# Patient Record
Sex: Female | Born: 1960 | Race: White | Hispanic: No | Marital: Married | Smoking: Never smoker
Health system: Southern US, Community
[De-identification: ages and names within clinical notes are randomized; demographics above are authoritative.]

## PROBLEM LIST (undated history)

## (undated) DIAGNOSIS — M545 Low back pain, unspecified: Secondary | ICD-10-CM

## (undated) DIAGNOSIS — N201 Calculus of ureter: Secondary | ICD-10-CM

## (undated) DIAGNOSIS — Z8541 Personal history of malignant neoplasm of cervix uteri: Secondary | ICD-10-CM

## (undated) DIAGNOSIS — F32A Depression, unspecified: Secondary | ICD-10-CM

## (undated) DIAGNOSIS — F419 Anxiety disorder, unspecified: Secondary | ICD-10-CM

## (undated) DIAGNOSIS — F329 Major depressive disorder, single episode, unspecified: Secondary | ICD-10-CM

## (undated) DIAGNOSIS — F988 Other specified behavioral and emotional disorders with onset usually occurring in childhood and adolescence: Secondary | ICD-10-CM

## (undated) DIAGNOSIS — C801 Malignant (primary) neoplasm, unspecified: Secondary | ICD-10-CM

## (undated) DIAGNOSIS — N200 Calculus of kidney: Secondary | ICD-10-CM

## (undated) DIAGNOSIS — R3915 Urgency of urination: Secondary | ICD-10-CM

## (undated) DIAGNOSIS — I10 Essential (primary) hypertension: Secondary | ICD-10-CM

## (undated) DIAGNOSIS — N189 Chronic kidney disease, unspecified: Secondary | ICD-10-CM

## (undated) DIAGNOSIS — G4733 Obstructive sleep apnea (adult) (pediatric): Secondary | ICD-10-CM

## (undated) DIAGNOSIS — E785 Hyperlipidemia, unspecified: Secondary | ICD-10-CM

## (undated) DIAGNOSIS — T8859XA Other complications of anesthesia, initial encounter: Secondary | ICD-10-CM

## (undated) DIAGNOSIS — R7303 Prediabetes: Secondary | ICD-10-CM

## (undated) DIAGNOSIS — D649 Anemia, unspecified: Secondary | ICD-10-CM

## (undated) DIAGNOSIS — G8929 Other chronic pain: Secondary | ICD-10-CM

## (undated) DIAGNOSIS — K760 Fatty (change of) liver, not elsewhere classified: Secondary | ICD-10-CM

## (undated) DIAGNOSIS — Z87442 Personal history of urinary calculi: Secondary | ICD-10-CM

## (undated) DIAGNOSIS — M1990 Unspecified osteoarthritis, unspecified site: Secondary | ICD-10-CM

## (undated) DIAGNOSIS — N907 Vulvar cyst: Secondary | ICD-10-CM

## (undated) DIAGNOSIS — G4730 Sleep apnea, unspecified: Secondary | ICD-10-CM

## (undated) DIAGNOSIS — R42 Dizziness and giddiness: Secondary | ICD-10-CM

## (undated) DIAGNOSIS — E119 Type 2 diabetes mellitus without complications: Secondary | ICD-10-CM

## (undated) DIAGNOSIS — Z9889 Other specified postprocedural states: Secondary | ICD-10-CM

## (undated) DIAGNOSIS — E039 Hypothyroidism, unspecified: Secondary | ICD-10-CM

## (undated) DIAGNOSIS — R112 Nausea with vomiting, unspecified: Secondary | ICD-10-CM

## (undated) DIAGNOSIS — G2581 Restless legs syndrome: Secondary | ICD-10-CM

## (undated) DIAGNOSIS — K429 Umbilical hernia without obstruction or gangrene: Secondary | ICD-10-CM

## (undated) DIAGNOSIS — K589 Irritable bowel syndrome without diarrhea: Secondary | ICD-10-CM

## (undated) DIAGNOSIS — K219 Gastro-esophageal reflux disease without esophagitis: Secondary | ICD-10-CM

## (undated) DIAGNOSIS — I499 Cardiac arrhythmia, unspecified: Secondary | ICD-10-CM

## (undated) HISTORY — PX: HERNIA REPAIR: SHX51

## (undated) HISTORY — DX: Sleep apnea, unspecified: G47.30

## (undated) HISTORY — PX: TUBAL LIGATION: SHX77

## (undated) HISTORY — DX: Calculus of kidney: N20.0

## (undated) HISTORY — DX: Gastro-esophageal reflux disease without esophagitis: K21.9

## (undated) HISTORY — PX: LASER ABLATION OF THE CERVIX: SHX1949

## (undated) HISTORY — PX: COLONOSCOPY: SHX174

## (undated) HISTORY — PX: DIAGNOSTIC LAPAROSCOPY: SUR761

## (undated) HISTORY — PX: ABDOMINAL HYSTERECTOMY: SHX81

## (undated) HISTORY — DX: Chronic kidney disease, unspecified: N18.9

---
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
## 1998-11-11 ENCOUNTER — Ambulatory Visit (HOSPITAL_COMMUNITY): Admission: RE | Admit: 1998-11-11 | Discharge: 1998-11-11 | Payer: Self-pay | Admitting: Obstetrics & Gynecology

## 2000-08-01 ENCOUNTER — Other Ambulatory Visit: Admission: RE | Admit: 2000-08-01 | Discharge: 2000-08-01 | Payer: Self-pay | Admitting: Obstetrics and Gynecology

## 2002-03-28 ENCOUNTER — Encounter: Payer: Self-pay | Admitting: Family Medicine

## 2002-03-28 ENCOUNTER — Ambulatory Visit (HOSPITAL_COMMUNITY): Admission: RE | Admit: 2002-03-28 | Discharge: 2002-03-28 | Payer: Self-pay | Admitting: Family Medicine

## 2002-04-03 ENCOUNTER — Other Ambulatory Visit: Admission: RE | Admit: 2002-04-03 | Discharge: 2002-04-03 | Payer: Self-pay | Admitting: Obstetrics and Gynecology

## 2002-04-23 ENCOUNTER — Observation Stay (HOSPITAL_COMMUNITY): Admission: RE | Admit: 2002-04-23 | Discharge: 2002-04-24 | Payer: Self-pay | Admitting: Obstetrics and Gynecology

## 2002-04-23 HISTORY — PX: LAPAROSCOPIC ASSISTED VAGINAL HYSTERECTOMY: SHX5398

## 2002-04-24 ENCOUNTER — Encounter (INDEPENDENT_AMBULATORY_CARE_PROVIDER_SITE_OTHER): Payer: Self-pay

## 2002-06-27 HISTORY — PX: BUNIONECTOMY: SHX129

## 2003-07-17 ENCOUNTER — Ambulatory Visit (HOSPITAL_COMMUNITY): Admission: RE | Admit: 2003-07-17 | Discharge: 2003-07-17 | Payer: Self-pay | Admitting: Family Medicine

## 2003-11-20 ENCOUNTER — Other Ambulatory Visit: Admission: RE | Admit: 2003-11-20 | Discharge: 2003-11-20 | Payer: Self-pay | Admitting: Family Medicine

## 2003-12-05 ENCOUNTER — Ambulatory Visit (HOSPITAL_COMMUNITY): Admission: RE | Admit: 2003-12-05 | Discharge: 2003-12-05 | Payer: Self-pay | Admitting: Family Medicine

## 2007-03-30 ENCOUNTER — Other Ambulatory Visit: Admission: RE | Admit: 2007-03-30 | Discharge: 2007-03-30 | Payer: Self-pay | Admitting: Family Medicine

## 2010-07-17 LAB — HM MAMMOGRAPHY: HM Mammogram: NEGATIVE

## 2010-11-12 NOTE — H&P (Signed)
NAME:  [name] Sherman, [name] Sherman                      ACCOUNT NO.:  [id number]   MEDICAL RECORD NO.:  [id number]                   PATIENT TYPE:   LOCATION:                                       FACILITY:   PHYSICIAN:  Zenaida Niece, M.D.             DATE OF BIRTH:  1960/08/08   DATE OF ADMISSION:  04/23/2002  DATE OF DISCHARGE:                                HISTORY & PHYSICAL   CHIEF COMPLAINT:  Pelvic pain and abnormal bleeding.   HISTORY OF PRESENT ILLNESS:  This is a 50 year old, white female, gravida 3,  para 2-0-1-0, whom I saw on April 03, 2002, for an annual exam.  She states  that she has fairly regular menses which are heavy for three days and she  has severe pelvic pain the week before, during, and the week after her  menses.  She has constant left lower quadrant pain with occasional  exacerbation and this pain is worse with coitus.  She always has deep  dyspareunia for the past two years and this is getting worse.  Due to this,  she has decreased sex drive.  She also complains of leaking urine with  coughing, laughing, sneezing, and with intercourse.  The physical exam  revealed uterosacral ligament tenderness with tenderness adnexa and a normal-  sized uterus.  She also has an exam consistent with stress incontinence and  a cystocele.  Options were discussed with the patient and she wishes to  proceed with definitive surgical therapy and is admitted for this at this  time.   PAST OBSTETRICAL HISTORY:  Two vaginal deliveries at term without  complications weighing 8 pounds 7 ounces and 9 pounds 8 ounces and one  elective termination.   PAST MEDICAL HISTORY:  Irritable bowel syndrome.   PAST SURGICAL HISTORY:  1. Bilateral tubal ligation.  2. Laser therapy to her cervix x 2.   ALLERGIES:  None known.   MEDICATIONS:  1. Multivitamins.  2. Over the counter iron.  3. Over the counter calcium.   GYNECOLOGICAL HISTORY:  1. History of two abnormal Pap smears,  treated with laser.  2. History of chlamydia.   SOCIAL HISTORY:  The patient is single.  Denies alcohol, tobacco, or drug  use.   FAMILY HISTORY:  Paternal grandmother with breast cancer.  Paternal aunt  with breast cancer.   REVIEW OF SYMPTOMS:  Otherwise negative with normal GI function currently.   PHYSICAL EXAMINATION:  WEIGHT:  200 pounds.  VITAL SIGNS:  Pulse 70, blood pressure 100/78.  GENERAL APPEARANCE:  She is a well-developed, well-nourished, white female  in no acute distress.  NECK:  Supple without lymphadenopathy or thyromegaly.  LUNGS:  Clear to auscultation.  HEART:  Regular rate and rhythm without murmur.  BREASTS:  The breasts examined in the sitting and supine positions reveal no  dominant masses, adenopathy, skin change, or nipple discharge.  ABDOMEN:  Soft, nondistended, and nontender, except in  the left lower  quadrant.  There are no palpable masses.  EXTREMITIES:  1+ edema and are nontender.  DTRs are 2/4 and symmetric.  PELVIC:  The external genitalia reveal no lesions.  The speculum exam  reveals a normal cervix.  A Pap smear was done with GC and chlamydia  cultures.  The Pap smear has returned with benign reactive changes and the  GC and chlamydia probe was negative.  On bimanual exam, she has a small,  retroverted uterus which is tender with tender uterosacral ligaments.  She  has tender bilateral adnexa on the left greater than the right without  palpable mass.  She has a grade 1-2 cystocele and a hypomobile urethra.  Office cystometrics reveal minimal postvoid residual.  She had initial urge  at 120 cc.  She did have a positive leak with approximately 220 cc in the  bladder when standing with Valsalva.   ASSESSMENT:  1. Pelvic pain.  2. Menorrhagia.  3. Dysmenorrhea.  4. Stress urinary incontinence.   Options including oral contraceptives, treatment with a [facility] agonist, and  conservative and definitive therapy have been discussed with the  patient.  The patient wishes to proceed with definitive surgical therapy.  The risks  of surgery, including bleeding, infection, and damage to surrounding organs,  have been discussed with the patient.  Also, specifically to the TVT  procedure, a risk of bowel and bladder injury, as well as urinary retention  have been discussed with the patient.  She understands these risks and wants  to proceed with surgery.   PLAN:  Admit the patient on the day of surgery for a laparoscopically  assisted vaginal hysterectomy.  We will remove her left tube and ovary as  her pain is mostly on the left side.  We will remove her right tube and  ovary if this appears significantly diseased.  She will also have an  anterior repair for her cystocele and a TVT procedure for stress urinary  incontinence.                                               Zenaida Niece, M.D.    TDM/MEDQ  D:  04/22/2002  T:  04/22/2002  Job:  [id number]

## 2010-11-12 NOTE — Op Note (Signed)
NAME:  [name] Sherman, [name] Sherman                      ACCOUNT NO.:  [id number]   MEDICAL RECORD NO.:  [id number]                   PATIENT TYPE:  OBV   LOCATION:  9142                                 FACILITY:  [facility]   PHYSICIAN:  Zenaida Niece, M.D.             DATE OF BIRTH:  06-04-1961   DATE OF PROCEDURE:  04/23/2002  DATE OF DISCHARGE:                                 OPERATIVE REPORT   PREOPERATIVE DIAGNOSES:  1. Pelvic pain.  2. Menorrhagia.  3. Dysmenorrhea.  4. Endometriosis.  5. Stress urinary incontinence.   POSTOPERATIVE DIAGNOSES:  1. Pelvic pain.  2. Menorrhagia.  3. Dysmenorrhea.  4. Endometriosis.  5. Stress urinary incontinence.   PROCEDURES:  1. Laparoscopically-assisted vaginal hysterectomy.  2. Left salpingo-oophorectomy.  3. Anterior colporrhaphy.  4. Tension-free vaginal tape.   SURGEON:  Zenaida Niece, M.D.   ASSISTANT:  Malachi Pro. Ambrose Mantle, M.D.   ANESTHESIA:  Combined spinal, epidural, and IV sedation.   ESTIMATED BLOOD LOSS:  150 cc.   FINDINGS:  A slightly enlarged uterus.  Normal tubes and ovaries with  evidence of prior tubal ligation.  She had a small spot of endometriosis in  the left posterior cul-de-sac.  A normal-appearing appendix, liver edge, and  gallbladder.  Grade 2 cystocele.   DESCRIPTION OF PROCEDURE:  The patient was taken to the operating room and  placed in the sitting position.  Octaviano Glow. Pamalee Leyden, M.D., instilled combined  spinal and epidural anesthesia, and she was placed in the dorsal supine  position and then placed in mobile stirrups.  Abdomen, perineum, and vagina  were then prepped and draped in the usual sterile fashion and her bladder  drained with a red rubber catheter and a Hulka tenaculum applied to her  cervix for uterine manipulation.  Infraumbilical skin was infiltrated with  0.25% Marcaine and the level of her anesthesia was found to be adequate.  A  1.5 cm infraumbilical incision was made and the Veress  needle inserted into  the peritoneal cavity.  Placement confirmed by saline test and an opening  pressure of 3 mmHg.  Four liters of CO2 gas were insufflated, and the Veress  needle was removed.  The 10/11 disposable trocar was then introduced and  placement confirmed by the laparoscope. Inspection revealed the above-  mentioned findings.  Five millimeter ports were placed on each side under  direct visualization.  The left tube was grasped with a grasper.  The left  infundibulopelvic ligament, mesosalpinx, round ligament, and upper broad  ligament were desiccated with the tripolar device and incised sharply.  Bleeding controlled with bipolar cautery.  The anterior peritoneum was  incised across the anterior portion of the uterus and the bladder pushed  inferiorly.  The right tube and ovary were then grasped and appeared normal.  The proximal right tube was then removed with the uterus.  The mesosalpinx,  round ligament, utero-ovarian ligament, and upper broad ligament  were  desiccated with the tripolar device and transected sharply.  Bleeding  controlled with the bipolar device.  The peritoneum was connected with the  previous anterior incision and the bladder pushed inferior.  There was no  significant bleeding, and at this point we proceeded vaginally.   The legs were elevated in stirrups and a weighted speculum inserted into the  vagina.  The cervix was grasped with Loman [place] and the cervicovaginal  mucosa was infiltrated with a dilute solution of Pitressin.  The  cervicovaginal mucosa was incised with electrocautery.  The posterior cul-de-  sac was grasped and entered sharply and the Bonnano speculum placed into the  posterior cul-de-sac.  The bladder was pushed off anteriorly sharply.  The  uterosacral ligaments were clamped, transected, and ligated on each side and  tagged for later use.  I was able to identify the anterior peritoneum and  enter it and use a Deaver retractor  to retract the bladder anterior.  Cardinal ligaments, uterine arteries, and the remainder of the broad  ligaments were clamped, transected, and ligated on each side with 0 Vicryl.  The uterus was then able to be removed.  Bleeding from the right ovarian  pedicle was controlled with a figure-of-eight suture of 0 Vicryl.  All the  other pedicles were found to be hemostatic.  The right uterosacral ligament  pedicle was reinforced with 0 Vicryl.   Attention was then turned to the anterior colporrhaphy.  The anterior  vaginal apex was grasped with Allis clamps and dissected in the midline from  the vaginal apex to 4 cm from the urethral meatus.  The bladder was  dissected off of this laterally to mobilize the cystocele.  The cystocele  was then reduced with interrupted sutures of 2-0 Vicryl with adequate  reduction.  Excess vaginal mucosa was then removed.  Uterosacral ligaments  were plicated in the midline with 0 silk, and the previously tagged  uterosacral pedicles were tied in the midline.  The entire vaginal incision  was then closed from the anterior colporrhaphy incision through the vaginal  hysterectomy incision with running locking 2-0 Vicryl with adequate  hemostasis.   Attention was then turned to the TVT procedure.  Local anesthesia was used  suprapubically for hydrodissection as well as vaginally for hydrodissection.  Stab incisions were made just above the pubic bone, each incision  approximately 3 cm from the midline.  A 1.5 cm vertical incision was made in  the vaginal mucosa at the level of the midurethra.  This was approximately 1  cm from the edge of the previous anterior colporrhaphy incision.  Sharp  dissection was used to dissect the vaginal mucosa from the underlying tissue  laterally to allow placement of the TVT.  The bladder was then emptied and  the catheter guide placing the Foley catheter.  The TVT device was then placed on each side, being careful to hug the  pubic bone once the guide did  pierce the endopelvic fascia.  The guides were brought up through the  abdominal incisions.  This was done on each side, careful not to twist the  tape.  Cystoscopy was then performed, and there was no entry into the  bladder from the TVT guides.  This was confirmed with an adequate  cystoscopy.  The patient had been given indigo carmine IV, and indigo  carmine was seen to come freely from each ureteral orifice.  The TVT needles  were then pulled through the abdominal incision and  removed sharply.  Using  Mayo scissors to adjust the TVT and the patient coughing, I adjusted the TVT  so that with minimal tension there was no urine leak when she coughed.  The  edges of the tape/mesh were then cut just below the skin edges.  The vaginal  incision was then closed with running locking 2-0 Vicryl with adequate  hemostasis.  A Foley catheter was then inserted into the bladder and the  vagina was packed with two-inch iodoform gauze.   Attention was turned back to laparoscopy.  The abdomen was again insufflated  with CO2 gas and the pelvis was copiously irrigated.  Bleeding from a  posterior peritoneal incision was controlled with bipolar cautery, and the  remainder of the pedicles were hemostatic.  All instruments were then  removed from the vagina.  The gas was allowed to deflate from the abdomen  and all trocars removed.  The incisions were closed with interrupted  subcuticular sutures of 4-0 Vicryl, followed by Steri-Strips and Band-Aids.  This included the TVT incisions.  The patient tolerated the procedure well  and was taken to the recovery room in stable condition.  Counts were correct  x2, and she was given Ancef 1 g prior to surgery.                                               Zenaida Niece, M.D.    TDM/MEDQ  D:  04/23/2002  T:  04/24/2002  Job:  [id number]

## 2011-03-24 ENCOUNTER — Other Ambulatory Visit: Payer: Self-pay | Admitting: Physician Assistant

## 2011-03-24 ENCOUNTER — Other Ambulatory Visit: Payer: Self-pay | Admitting: Family Medicine

## 2011-03-24 DIAGNOSIS — Z1231 Encounter for screening mammogram for malignant neoplasm of breast: Secondary | ICD-10-CM

## 2011-04-12 ENCOUNTER — Other Ambulatory Visit (HOSPITAL_COMMUNITY)
Admission: RE | Admit: 2011-04-12 | Discharge: 2011-04-12 | Disposition: A | Discharge status: Home / Self Care | Payer: 59 | Source: Ambulatory Visit | Attending: Family Medicine | Admitting: Family Medicine

## 2011-04-12 ENCOUNTER — Ambulatory Visit
Admission: RE | Admit: 2011-04-12 | Discharge: 2011-04-12 | Disposition: A | Discharge status: Home / Self Care | Payer: 59 | Source: Ambulatory Visit | Attending: Family Medicine | Admitting: Family Medicine

## 2011-04-12 ENCOUNTER — Other Ambulatory Visit: Payer: Self-pay | Admitting: Physician Assistant

## 2011-04-12 DIAGNOSIS — Z0000 Encounter for general adult medical examination without abnormal findings: Secondary | ICD-10-CM | POA: Insufficient documentation

## 2011-04-12 DIAGNOSIS — Z1231 Encounter for screening mammogram for malignant neoplasm of breast: Secondary | ICD-10-CM

## 2011-04-19 ENCOUNTER — Other Ambulatory Visit: Payer: Self-pay | Admitting: Gastroenterology

## 2011-04-21 ENCOUNTER — Ambulatory Visit
Admission: RE | Admit: 2011-04-21 | Discharge: 2011-04-21 | Disposition: A | Discharge status: Home / Self Care | Payer: 59 | Source: Ambulatory Visit | Attending: Gastroenterology | Admitting: Gastroenterology

## 2011-04-22 ENCOUNTER — Other Ambulatory Visit: Payer: Self-pay | Admitting: Gastroenterology

## 2011-07-18 LAB — HM COLONOSCOPY: HM Colonoscopy: NEGATIVE

## 2011-09-17 ENCOUNTER — Emergency Department (HOSPITAL_BASED_OUTPATIENT_CLINIC_OR_DEPARTMENT_OTHER)
Admission: EM | Admit: 2011-09-17 | Discharge: 2011-09-17 | Disposition: A | Discharge status: Home / Self Care | Payer: 59 | Attending: Emergency Medicine | Admitting: Emergency Medicine

## 2011-09-17 ENCOUNTER — Encounter (HOSPITAL_BASED_OUTPATIENT_CLINIC_OR_DEPARTMENT_OTHER): Payer: Self-pay | Admitting: Emergency Medicine

## 2011-09-17 DIAGNOSIS — H5789 Other specified disorders of eye and adnexa: Secondary | ICD-10-CM | POA: Insufficient documentation

## 2011-09-17 DIAGNOSIS — E7800 Pure hypercholesterolemia, unspecified: Secondary | ICD-10-CM | POA: Insufficient documentation

## 2011-09-17 DIAGNOSIS — L509 Urticaria, unspecified: Secondary | ICD-10-CM | POA: Insufficient documentation

## 2011-09-17 LAB — DIFFERENTIAL
Eosinophils Absolute: 0 10*3/uL (ref 0.0–0.7)
Eosinophils Relative: 0 % (ref 0–5)
Lymphocytes Relative: 12 % (ref 12–46)
Lymphs Abs: 1.2 10*3/uL (ref 0.7–4.0)
Monocytes Absolute: 0.5 10*3/uL (ref 0.1–1.0)
Monocytes Relative: 5 % (ref 3–12)

## 2011-09-17 LAB — COMPREHENSIVE METABOLIC PANEL
BUN: 9 mg/dL (ref 6–23)
CO2: 24 mEq/L (ref 19–32)
Calcium: 8.4 mg/dL (ref 8.4–10.5)
GFR calc Af Amer: >90 mL/min (ref >90)
GFR calc non Af Amer: >90 mL/min (ref >90)
Glucose, Bld: 121 mg/dL — ABNORMAL HIGH (ref 70–99)
Total Protein: 6.9 g/dL (ref 6.0–8.3)

## 2011-09-17 LAB — CBC
HCT: 41.9 % (ref 36.0–46.0)
Hemoglobin: 14.2 g/dL (ref 12.0–15.0)
MCH: 27.2 pg (ref 26.0–34.0)
MCV: 80.1 fL (ref 78.0–100.0)
RBC: 5.23 MIL/uL — ABNORMAL HIGH (ref 3.87–5.11)

## 2011-09-17 MED ORDER — SODIUM CHLORIDE 0.9 % IV BOLUS (SEPSIS)
1000.0000 mL | Freq: Once | INTRAVENOUS | Status: AC
  Administered 2011-09-17: 1000 mL via INTRAVENOUS

## 2011-09-17 MED ORDER — ONDANSETRON HCL 4 MG/2ML IJ SOLN
4.0000 mg | Freq: Once | INTRAMUSCULAR | Status: AC
  Administered 2011-09-17: 4 mg via INTRAVENOUS
  Filled 2011-09-17: qty 2

## 2011-09-17 MED ORDER — DIPHENHYDRAMINE HCL 50 MG/ML IJ SOLN
50.0000 mg | Freq: Once | INTRAMUSCULAR | Status: AC
  Administered 2011-09-17: 50 mg via INTRAVENOUS
  Filled 2011-09-17: qty 1

## 2011-09-17 MED ORDER — PREDNISONE 10 MG PO TABS: 20.0000 mg | ORAL_TABLET | Freq: Every day | ORAL | Status: DC

## 2011-09-17 MED ORDER — METHYLPREDNISOLONE SODIUM SUCC 125 MG IJ SOLR
125.0000 mg | Freq: Once | INTRAMUSCULAR | Status: AC
  Administered 2011-09-17: 125 mg via INTRAVENOUS
  Filled 2011-09-17: qty 2

## 2011-09-17 NOTE — ED Provider Notes (Signed)
History     CSN: [id number]  Arrival date & time 09/17/11  1015   First MD Initiated Contact with Patient 09/17/11 1114      Chief Complaint  Patient presents with  . Rash    (Consider location/radiation/quality/duration/timing/severity/associated sxs/prior treatment) Patient is a 51 y.o. female presenting with rash.  Rash     Rash began last night on breasts then back now present diffuse trunk.  Patient took benadryl last night and able to sleep.  No change in medication.  Eyelids swollen. No throat swelling, no difficulty breathing.  No new exposures to meds or known other exposures.  PMD eagle Past Medical History  Diagnosis Date  . Cervical cancer   . Migraine   . Hypercholesteremia   . Vitamin d deficiency     Past Surgical History  Procedure Date  . Abdominal hysterectomy   . Laser ablation   . Bunionectomy     History reviewed. No pertinent family history.  History  Substance Use Topics  . Smoking status: Not on file  . Smokeless tobacco: Not on file  . Alcohol Use:     OB History    Grav Para Term Preterm Abortions TAB SAB Ect Mult Living                  Review of Systems  Constitutional: Positive for chills. Negative for fever.  HENT: Positive for facial swelling. Negative for congestion, rhinorrhea, neck pain and neck stiffness.   Eyes: Positive for itching.  Respiratory: Negative for cough, choking, chest tightness and shortness of breath.   Skin: Positive for rash.  All other systems reviewed and are negative.    Allergies  Review of patient's allergies indicates no known allergies.  Home Medications   Current Outpatient Rx  Name Route Sig Dispense Refill  . ATORVASTATIN CALCIUM 10 MG PO TABS Oral Take 10 mg by mouth daily.    Marland Kitchen VITAMIN D 1000 UNITS PO TABS Oral Take 1,000 Units by mouth daily.    Marland Kitchen CITALOPRAM HYDROBROMIDE 40 MG PO TABS Oral Take 40 mg by mouth daily.    Marland Kitchen DIPHENHYDRAMINE HCL (SLEEP) 25 MG PO TABS Oral Take 50 mg by  mouth every 4 (four) hours as needed.    Marland Kitchen VITAMIN D (ERGOCALCIFEROL) 50000 UNITS PO CAPS Oral Take 50,000 Units by mouth.      BP 127/62  Pulse 88  Temp(Src) 98.1 F (36.7 C) (Oral)  Resp 16  SpO2 96%  Physical Exam  Nursing note and vitals reviewed. Constitutional: She is oriented to person, place, and time. She appears well-developed and well-nourished. No distress.  HENT:  Head: Normocephalic and atraumatic.  Right Ear: External ear normal.  Left Ear: External ear normal.  Nose: Nose normal.  Mouth/Throat: Oropharynx is clear and moist.  Eyes: Conjunctivae and EOM are normal. Pupils are equal, round, and reactive to light.  Neck: Normal range of motion.  Cardiovascular: Normal rate and regular rhythm.   Pulmonary/Chest: Effort normal and breath sounds normal.  Abdominal: Soft. Bowel sounds are normal.  Musculoskeletal: Normal range of motion.  Neurological: She is alert and oriented to person, place, and time.  Skin: Skin is warm. Rash noted. No petechiae and no purpura noted. Rash is urticarial.     Psychiatric: She has a normal mood and affect.    ED Course  Procedures (including critical care time)  Labs Reviewed - No data to display No results found.   No diagnosis found.  MDM  Patient given Benadryl 50 mg IV and Solu-Medrol 125 mg IV. She has decreased reddened areas and decreased area of urticaria. She does have clear lung sounds and no mucous membrane involvement plan discharged home on prednisone and Benadryl. She is instructed to return if she is worse at any time especially any signs or symptoms of difficulty swallowing or breathing.        Hilario Quarry, MD 09/18/11 [phone]

## 2011-09-17 NOTE — ED Notes (Addendum)
Generalized rash since yesterday.  Pt states it started itching and now it is burning.  Pt states she has not changed meds or soaps at home.  Pt having some dizziness and nausea.

## 2011-09-17 NOTE — Discharge Instructions (Signed)
Hives Hives (urticaria) are itchy, red, swollen patches on the skin. They may change size, shape, and location quickly and repeatedly. Hives that occur deeper in the skin can cause swelling of the hands, feet, and face. Hives may be an allergic reaction to something you or your child ate, touched, or put on the skin. Hives can also be a reaction to cold, heat, viral infections, medication, insect bites, or emotional stress. Often the cause is hard to find. Hives can come and go for several days to several weeks. Hives are not contagious. HOME CARE INSTRUCTIONS   If the cause of the hives is known, avoid exposure to that source.   To relieve itching and rash:   Apply cold compresses to the skin or take cool water baths. Do not take or give your child hot baths or showers because the warmth will make the itching worse.   The best medicine for hives is an antihistamine. An antihistamine will not cure hives, but it will reduce their severity. You can use an antihistamine available over the counter. This medicine may make your child sleepy. Teenagers should not drive while using this medicine.   Take or give an antihistamine every 6 hours until the hives are completely gone for 24 hours or as directed.   Your child may have other medications prescribed for itching. Give these as directed by your child's caregiver.   You or your child should wear loose fitting clothing, including undergarments. Skin irritations may make hives worse.   Follow-up as directed by your caregiver.  SEEK MEDICAL CARE IF:   You or your child still have considerable itching after taking the medication (prescribed or purchased over the counter).   Joint swelling or pain occurs.  SEEK IMMEDIATE MEDICAL CARE IF:   You have a fever.   Swollen lips or tongue are noticed.   There is difficulty with breathing, swallowing, or tightness in the throat or chest.   Abdominal pain develops.   Your child starts acting very  sick.  These may be the first signs of a life-threatening allergic reaction. THIS IS AN EMERGENCY. Call 911 for medical help. MAKE SURE YOU:   Understand these instructions.   Will watch your condition.   Will get help right away if you are not doing well or get worse.  Document Released: 06/13/2005 Document Revised: 06/02/2011 Document Reviewed: 02/01/2008 [employer] Patient Information 2012 [employer]. 

## 2011-09-17 NOTE — ED Notes (Signed)
Family member came to nurses station asking for discharge paperwork, Marva, Geophysicist/field seismologist nurse in process of discharging pt at this time.

## 2012-11-14 ENCOUNTER — Ambulatory Visit: Payer: 59 | Admitting: Family Medicine

## 2012-11-14 ENCOUNTER — Ambulatory Visit (INDEPENDENT_AMBULATORY_CARE_PROVIDER_SITE_OTHER): Payer: 59 | Admitting: Family Medicine

## 2012-11-14 ENCOUNTER — Encounter: Payer: Self-pay | Admitting: Family Medicine

## 2012-11-14 VITALS — BP 130/90 | HR 72 | Temp 98.3°F | Resp 12 | Ht 64.5 in | Wt 254.0 lb

## 2012-11-14 DIAGNOSIS — G43909 Migraine, unspecified, not intractable, without status migrainosus: Secondary | ICD-10-CM

## 2012-11-14 DIAGNOSIS — R609 Edema, unspecified: Secondary | ICD-10-CM

## 2012-11-14 DIAGNOSIS — G4733 Obstructive sleep apnea (adult) (pediatric): Secondary | ICD-10-CM

## 2012-11-14 DIAGNOSIS — E785 Hyperlipidemia, unspecified: Secondary | ICD-10-CM

## 2012-11-14 DIAGNOSIS — R600 Localized edema: Secondary | ICD-10-CM

## 2012-11-14 MED ORDER — VITAMIN D (ERGOCALCIFEROL) 1.25 MG (50000 UNIT) PO CAPS: 50000.0000 [IU] | ORAL_CAPSULE | ORAL | Status: DC

## 2012-11-14 MED ORDER — CITALOPRAM HYDROBROMIDE 20 MG PO TABS: 20.0000 mg | ORAL_TABLET | Freq: Every day | ORAL | Status: DC

## 2012-11-14 MED ORDER — ATORVASTATIN CALCIUM 10 MG PO TABS: 10.0000 mg | ORAL_TABLET | Freq: Every day | ORAL | Status: DC

## 2012-11-14 NOTE — Progress Notes (Signed)
  Subjective:    Patient ID: [name] Sherman, female    DOB: Nov 05, 1960, 52 y.o.   MRN: [id number]  HPI  New patient to establish care. She has history of migraine headaches, obesity, hyperlipidemia, obstructive sleep apnea She recently ran out of insurance and now has coverage again. Has been out of medications. She has history of some chronic anxiety has been on Celexa high-dose 60 mg daily. Previously taken Lipitor for hyperlipidemia. She takes protonix for GERD She reports prior history of low vitamin D. and is on replacement.  She's not had a complete physical in quite some time. Previous hysterectomy 2002.  Patient is married. She has adopted 60-year-old child. She works in [employer] production pl[employer]. Nonsmoker. Occasional alcohol use.  Family history significant for heart disease, hyperlipidemia, hypertension in parents.  Recent problems with increased peripheral edema. She's taken another family member's furosemide intermittently. Edema is symmetric and worse late day. She has not had thyroid functions, renal profile or any other recent lab work.  Past Medical History  Diagnosis Date  . Migraine   . Hypercholesteremia   . Vitamin D deficiency   . Cervical cancer    Past Surgical History  Procedure Laterality Date  . Abdominal hysterectomy    . Laser ablation    . Bunionectomy      reports that she has never smoked. She does not have any smokeless tobacco history on file. Her alcohol and drug histories are not on file. family history includes Alcohol abuse in her mother; Arthritis in her father; Cancer in her maternal grandmother and paternal grandmother; Heart disease in her father; Hyperlipidemia in her father; Hypertension in her father; and Kidney disease in her paternal uncle. No Known Allergies    Review of Systems  Constitutional: Positive for fatigue and unexpected weight change (weight gain). Negative for appetite change.  Respiratory: Negative for cough, shortness of  breath and wheezing.   Cardiovascular: Positive for leg swelling. Negative for chest pain and palpitations.  Gastrointestinal: Negative for nausea, vomiting, diarrhea and constipation.  Neurological: Negative for dizziness and headaches.  Hematological: Negative for adenopathy.  Psychiatric/Behavioral: Negative for confusion and dysphoric mood.       Objective:   Physical Exam  Constitutional: She is oriented to person, place, and time. She appears well-developed and well-nourished.  HENT:  Right Ear: External ear normal.  Left Ear: External ear normal.  Mouth/Throat: Oropharynx is clear and moist.  Neck: Neck supple. No thyromegaly present.  Cardiovascular: Normal rate and regular rhythm.   Pulmonary/Chest: Effort normal and breath sounds normal. No respiratory distress. She has no wheezes. She has no rales.  Abdominal: Soft. Bowel sounds are normal. She exhibits no distension. There is no guarding.  Near midline just above the umbilicus she has a soft mass which is noted with sitting up but disappears with lying supine. Suspect probable hernia.  Musculoskeletal: She exhibits edema.  Trace to 1+ pitting edema lower legs bilaterally  Neurological: She is alert and oriented to person, place, and time.          Assessment & Plan:  #1 history of migraine headaches #2 history of obstructive sleep apnea-on CPAP. #3 obesity with recent weight gain #4 mild peripheral edema of uncertain etiology. Needs further evaluation-will schedule CPE and labs then. #5 history of hyperlipidemia-refilled Lipitor. #6 history of chronic anxiety-refilled Celexa.  Schedule complete physical and further evaluate at that time. We refilled her Lipitor and Celexa.

## 2012-11-23 ENCOUNTER — Other Ambulatory Visit (INDEPENDENT_AMBULATORY_CARE_PROVIDER_SITE_OTHER): Payer: 59

## 2012-11-23 DIAGNOSIS — Z0000 Encounter for general adult medical examination without abnormal findings: Secondary | ICD-10-CM

## 2012-11-23 LAB — BASIC METABOLIC PANEL
Calcium: 9 mg/dL (ref 8.4–10.5)
GFR: 77.3 mL/min (ref >60.00)
Glucose, Bld: 104 mg/dL — ABNORMAL HIGH (ref 70–99)
Potassium: 4.6 mEq/L (ref 3.5–5.1)
Sodium: 139 mEq/L (ref 135–145)

## 2012-11-23 LAB — POCT URINALYSIS DIPSTICK
Bilirubin, UA: NEGATIVE
Blood, UA: NEGATIVE
Ketones, UA: NEGATIVE
Leukocytes, UA: NEGATIVE
Protein, UA: NEGATIVE
Spec Grav, UA: 1.025
pH, UA: 7

## 2012-11-23 LAB — CBC WITH DIFFERENTIAL/PLATELET
Basophils Absolute: 0.1 10*3/uL (ref 0.0–0.1)
Eosinophils Relative: 3.1 % (ref 0.0–5.0)
HCT: 35.3 % — ABNORMAL LOW (ref 36.0–46.0)
Lymphs Abs: 1.9 10*3/uL (ref 0.7–4.0)
MCV: 80.5 fl (ref 78.0–100.0)
Monocytes Absolute: 0.6 10*3/uL (ref 0.1–1.0)
Neutrophils Relative %: 66.9 % (ref 43.0–77.0)
Platelets: 336 10*3/uL (ref 150.0–400.0)
RDW: 14.1 % (ref 11.5–14.6)
WBC: 8.6 10*3/uL (ref 4.5–10.5)

## 2012-11-23 LAB — LIPID PANEL
Cholesterol: 147 mg/dL (ref 0–200)
HDL: 37 mg/dL — ABNORMAL LOW (ref >39.00)
Triglycerides: 127 mg/dL (ref 0.0–149.0)
VLDL: 25.4 mg/dL (ref 0.0–40.0)

## 2012-11-23 LAB — HEPATIC FUNCTION PANEL
ALT: 19 U/L (ref 0–35)
AST: 16 U/L (ref 0–37)
Albumin: 3.5 g/dL (ref 3.5–5.2)
Alkaline Phosphatase: 90 U/L (ref 39–117)
Bilirubin, Direct: 0 mg/dL (ref 0.0–0.3)
Total Protein: 7.1 g/dL (ref 6.0–8.3)

## 2012-12-04 ENCOUNTER — Encounter: Payer: Self-pay | Admitting: Family Medicine

## 2012-12-04 ENCOUNTER — Other Ambulatory Visit (HOSPITAL_COMMUNITY)
Admission: RE | Admit: 2012-12-04 | Discharge: 2012-12-04 | Disposition: A | Discharge status: Home / Self Care | Payer: 59 | Source: Ambulatory Visit | Attending: Family Medicine | Admitting: Family Medicine

## 2012-12-04 ENCOUNTER — Ambulatory Visit (INDEPENDENT_AMBULATORY_CARE_PROVIDER_SITE_OTHER): Payer: 59 | Admitting: Family Medicine

## 2012-12-04 VITALS — BP 138/92 | HR 84 | Temp 98.2°F | Resp 12 | Ht 64.25 in | Wt 251.0 lb

## 2012-12-04 DIAGNOSIS — Z0000 Encounter for general adult medical examination without abnormal findings: Secondary | ICD-10-CM

## 2012-12-04 DIAGNOSIS — R109 Unspecified abdominal pain: Secondary | ICD-10-CM

## 2012-12-04 DIAGNOSIS — D649 Anemia, unspecified: Secondary | ICD-10-CM

## 2012-12-04 DIAGNOSIS — Z01419 Encounter for gynecological examination (general) (routine) without abnormal findings: Secondary | ICD-10-CM | POA: Insufficient documentation

## 2012-12-04 LAB — VITAMIN B12: Vitamin B-12: 248 pg/mL (ref 211–911)

## 2012-12-04 NOTE — Progress Notes (Signed)
Subjective:    Patient ID: [name] Sherman, female    DOB: Jan 13, 1961, 52 y.o.   MRN: [id number]  HPI Patient here for complete physical Just recently establish care. She has history of obesity, obstructive sleep apnea, hyperlipidemia, GERD, and migraine headaches. She's not had mammogram in few years. Patient had hysterectomy 2002 reportedly secondary to menorrhagia She had remote history of abnormal Pap smear many years ago but her understanding is that her cervix was left. We located her cervical report from 2003 and this was a laparoscopic-assisted hysterectomy with left salpingo-oophorectomy so apparently her cervix was removed  She had colonoscopy last year. Tetanus is up-to-date.  Patient had acute issue of some ventral abdominal pain for several years which is been progressive. Intermittent swelling ventral abdominal area just above the umbilicus and this is frequently reducible. No clear exacerbating factors. She's not had any recent appetite changes. She's had some recent weight gain. No stool changes. No fever or chills. Patient had endoscopic workup with upper endoscopy reportedly last year and this was reportedly normal. She had abdominal ultrasound October 2012 no acute findings Her pain is severe at times.  Past Medical History  Diagnosis Date  . Migraine   . Hypercholesteremia   . Vitamin D deficiency   . Cervical cancer    Past Surgical History  Procedure Laterality Date  . Laser ablation    . Bunionectomy    . Abdominal hysterectomy  age 31    menorrhagia    reports that she has never smoked. She does not have any smokeless tobacco history on file. Her alcohol and drug histories are not on file. family history includes Alcohol abuse in her mother; Arthritis in her father; Cancer in her maternal grandmother and paternal grandmother; Heart disease in her father; Hyperlipidemia in her father; Hypertension in her father; and Kidney disease in her paternal uncle. No  Known Allergies    Review of Systems  Constitutional: Positive for fatigue. Negative for fever, activity change, appetite change and unexpected weight change.  HENT: Negative for hearing loss, ear pain, sore throat and trouble swallowing.   Eyes: Negative for visual disturbance.  Respiratory: Negative for cough and shortness of breath.   Cardiovascular: Negative for chest pain and palpitations.  Gastrointestinal: Positive for abdominal pain. Negative for nausea, vomiting, diarrhea, constipation, blood in stool and abdominal distention.  Endocrine: Negative for polydipsia and polyuria.  Genitourinary: Negative for dysuria and hematuria.  Musculoskeletal: Negative for myalgias, back pain and arthralgias.  Skin: Negative for rash.  Neurological: Negative for dizziness, syncope and headaches.  Hematological: Negative for adenopathy.  Psychiatric/Behavioral: Negative for confusion and dysphoric mood.       Objective:   Physical Exam  Constitutional: She is oriented to person, place, and time. She appears well-developed and well-nourished.  HENT:  Head: Normocephalic and atraumatic.  Eyes: EOM are normal. Pupils are equal, round, and reactive to light.  Neck: Normal range of motion. Neck supple. No thyromegaly present.  Cardiovascular: Normal rate, regular rhythm and normal heart sounds.   No murmur heard. Pulmonary/Chest: Breath sounds normal. No respiratory distress. She has no wheezes. She has no rales.  Abdominal: Soft. Bowel sounds are normal. She exhibits no distension. There is no rebound and no guarding.  Patient has soft tissue prominence anterior abdomen when straining forward. This reduces with minimal pressure. Question defect compatible with ventral hernia palpated. No other mass.  Genitourinary:  Breasts are symmetric with no mass. No skin changes. Pelvic exam normal external genitalia.  Vaginal mucosa is normal. The performed bimanual exam initially and no cervix located.  Pap smear of vaginal cuff obtained  Musculoskeletal: Normal range of motion. She exhibits no edema.  Lymphadenopathy:    She has no cervical adenopathy.  Neurological: She is alert and oriented to person, place, and time. She displays normal reflexes. No cranial nerve deficit.  Skin: No rash noted.  Psychiatric: She has a normal mood and affect. Her behavior is normal. Judgment and thought content normal.          Assessment & Plan:  #1 complete physical discussed weight loss strategies. Labs reviewed. Minimally low hemoglobin which is normocytic otherwise no major concerning abnormalities. Schedule mammogram. Pap smear obtained but no need for ongoing Pap smears with no evidence for cervix. Recent colonoscopy as above #2 elevated blood pressure. Borderline elevation. Recommend some weight loss and reassess 3 months #3 persistent/chronic ventral abdominal pain. Suspect ventral abdominal hernia. She's had progressive pain over several months. CT abdomen pelvis to further assess. Consider surgical consultation

## 2012-12-04 NOTE — Patient Instructions (Addendum)
Set up mammogram. We will call you regarding CT scan of abdomen. Try to lose some weight and let's plan follow up BP assesement within 3 months.

## 2012-12-05 LAB — IRON AND TIBC: TIBC: 305 ug/dL (ref 250–470)

## 2012-12-05 NOTE — Progress Notes (Signed)
Quick Note:  Pt informed ______ 

## 2012-12-10 ENCOUNTER — Ambulatory Visit (INDEPENDENT_AMBULATORY_CARE_PROVIDER_SITE_OTHER)
Admission: RE | Admit: 2012-12-10 | Discharge: 2012-12-10 | Disposition: A | Discharge status: Home / Self Care | Payer: 59 | Source: Ambulatory Visit | Attending: Family Medicine | Admitting: Family Medicine

## 2012-12-10 ENCOUNTER — Other Ambulatory Visit: Payer: Self-pay | Admitting: *Deleted

## 2012-12-10 DIAGNOSIS — K439 Ventral hernia without obstruction or gangrene: Secondary | ICD-10-CM

## 2012-12-10 DIAGNOSIS — R109 Unspecified abdominal pain: Secondary | ICD-10-CM

## 2012-12-10 MED ORDER — IOHEXOL 300 MG/ML  SOLN
100.0000 mL | Freq: Once | INTRAMUSCULAR | Status: AC | PRN
  Administered 2012-12-10: 100 mL via INTRAVENOUS

## 2012-12-10 NOTE — Progress Notes (Signed)
Quick Note:  Pt informed and yes she would like the referral to general surgeon, Dr Magnus Ivan. I will order, FYI ______

## 2012-12-19 ENCOUNTER — Encounter: Payer: Self-pay | Admitting: Family Medicine

## 2012-12-24 ENCOUNTER — Encounter: Payer: Self-pay | Admitting: Family Medicine

## 2012-12-26 ENCOUNTER — Encounter: Payer: Self-pay | Admitting: Family Medicine

## 2012-12-26 ENCOUNTER — Ambulatory Visit (INDEPENDENT_AMBULATORY_CARE_PROVIDER_SITE_OTHER): Payer: 59 | Admitting: Surgery

## 2012-12-26 ENCOUNTER — Encounter (INDEPENDENT_AMBULATORY_CARE_PROVIDER_SITE_OTHER): Payer: Self-pay | Admitting: Surgery

## 2012-12-26 ENCOUNTER — Ambulatory Visit (INDEPENDENT_AMBULATORY_CARE_PROVIDER_SITE_OTHER): Payer: 59 | Admitting: Family Medicine

## 2012-12-26 VITALS — BP 148/98 | HR 66 | Resp 14 | Ht 64.5 in | Wt 251.8 lb

## 2012-12-26 VITALS — BP 124/80 | HR 82 | Temp 98.3°F | Ht 64.0 in | Wt 253.0 lb

## 2012-12-26 DIAGNOSIS — F988 Other specified behavioral and emotional disorders with onset usually occurring in childhood and adolescence: Secondary | ICD-10-CM | POA: Insufficient documentation

## 2012-12-26 DIAGNOSIS — K432 Incisional hernia without obstruction or gangrene: Secondary | ICD-10-CM | POA: Insufficient documentation

## 2012-12-26 MED ORDER — AMPHETAMINE-DEXTROAMPHETAMINE 20 MG PO TABS: 20.0000 mg | ORAL_TABLET | Freq: Two times a day (BID) | ORAL | Status: DC

## 2012-12-26 NOTE — Progress Notes (Signed)
  Subjective:    Patient ID: [name] Sherman, female    DOB: 05-05-1961, 51 y.o.   MRN: [id number]  HPI Patient seen with concern for possible adult ADD. Never has been diagnosed. She struggled with issues with attention throughout school. Currently works as a Veterinary surgeon and is having great difficulties with performing multiple job functions. Difficulty staying focused. Easy distractibility. Difficulty completing tasks. Frequent forgetful and makes careless mistakes. Frequently avoids activities that require focus.  She has noted that these occurred in more than one environment. Has difficulty reading and frequently has to re-read passages. She has 2 sons who have attention deficit disorder. Denies any anxiety or depression symptoms,.  No history of any learning disabilities  Past Medical History  Diagnosis Date  . Migraine   . Hypercholesteremia   . Vitamin D deficiency   . GERD (gastroesophageal reflux disease)   . Cervical cancer    Past Surgical History  Procedure Laterality Date  . Laser ablation    . Bunionectomy    . Abdominal hysterectomy  age 61    menorrhagia    reports that she has never smoked. She does not have any smokeless tobacco history on file. Her alcohol and drug histories are not on file. family history includes Alcohol abuse in her mother; Arthritis in her father; Cancer in her maternal grandmother and paternal grandmother; Heart disease in her father; Hyperlipidemia in her father; Hypertension in her father; and Kidney disease in her paternal uncle. No Known Allergies     Review of Systems  Constitutional: Negative for appetite change and unexpected weight change.  Eyes: Negative for visual disturbance.  Respiratory: Negative for shortness of breath.   Cardiovascular: Negative for chest pain.  Neurological: Negative for dizziness, weakness and headaches.  Psychiatric/Behavioral: Positive for decreased concentration. Negative for dysphoric mood and  agitation.       Objective:   Physical Exam  Constitutional: She appears well-developed and well-nourished.  Neck: Neck supple. No thyromegaly present.  Cardiovascular: Normal rate and regular rhythm.  Exam reveals no gallop.   No murmur heard. Pulmonary/Chest: Effort normal and breath sounds normal. No respiratory distress. She has no wheezes. She has no rales.  Musculoskeletal: She exhibits no edema.          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 We discussed options. We discussed further testing but at this point she meets DSM criteria and has had significant work difficulties related to her inattention. We agreed a trial of Adderall 20 mg twice a day as needed. Reassess one month.  We'll discussion about possible side effects.

## 2012-12-26 NOTE — Progress Notes (Signed)
Patient ID: [name] Sherman, female   DOB: 03-14-1961, 52 y.o.   MRN: [id number]  Chief Complaint  Patient presents with  . Other    ventral hernia    HPI [name] Sherman is a 52 y.o. female.   HPI She is referred by Dr. Caryl Never for evaluation of a symptomatic ventral hernia. She reports she has had the hernia for some time but is now causing increasing discomfort and nausea. The pain is described as sharp and intermittent. She has had no vomiting. She moved her bowels well and has no other complaints. The pain is now referred anywhere else. Past Medical History  Diagnosis Date  . Migraine   . Hypercholesteremia   . Vitamin D deficiency   . GERD (gastroesophageal reflux disease)   . Cervical cancer     Past Surgical History  Procedure Laterality Date  . Laser ablation    . Bunionectomy    . Abdominal hysterectomy  age 23    menorrhagia    Family History  Problem Relation Age of Onset  . Alcohol abuse Mother   . Arthritis Father   . Hyperlipidemia Father   . Heart disease Father   . Hypertension Father   . Kidney disease Paternal Uncle   . Cancer Maternal Grandmother     colon  . Cancer Paternal Grandmother     breast    Social History History  Substance Use Topics  . Smoking status: Never Smoker   . Smokeless tobacco: Not on file  . Alcohol Use: Yes     Comment: 1 x year    No Known Allergies  Current Outpatient Prescriptions  Medication Sig Dispense Refill  . amphetamine-dextroamphetamine (ADDERALL) 20 MG tablet Take 1 tablet (20 mg total) by mouth 2 (two) times daily.  60 tablet  0  . atorvastatin (LIPITOR) 10 MG tablet Take 1 tablet (10 mg total) by mouth daily.  90 tablet  3  . citalopram (CELEXA) 20 MG tablet Take 1 tablet (20 mg total) by mouth daily. Taking 3 tabs at HS  270 tablet  3  . pantoprazole (PROTONIX) 20 MG tablet Take 20 mg by mouth.      . Vitamin D, Ergocalciferol, (DRISDOL) 50000 UNITS CAPS Take 1 capsule (50,000 Units total) by mouth  every 7 (seven) days.  16 capsule  3   No current facility-administered medications for this visit.    Review of Systems Review of Systems  Constitutional: Negative for fever, chills and unexpected weight change.  HENT: Negative for hearing loss, congestion, sore throat, trouble swallowing and voice change.   Eyes: Negative for visual disturbance.  Respiratory: Negative for cough and wheezing.   Cardiovascular: Positive for leg swelling. Negative for chest pain and palpitations.  Gastrointestinal: Positive for nausea, abdominal pain and abdominal distention. Negative for vomiting, diarrhea, constipation, blood in stool and anal bleeding.  Genitourinary: Negative for hematuria, vaginal bleeding and difficulty urinating.  Musculoskeletal: Positive for arthralgias.  Skin: Negative for rash and wound.  Neurological: Positive for headaches. Negative for seizures and syncope.  Hematological: Negative for adenopathy. Does not bruise/bleed easily.  Psychiatric/Behavioral: Negative for confusion.    Blood pressure 148/98, pulse 66, resp. rate 14, height 5' 4.5" (1.638 m), weight 251 lb 12.8 oz (114.216 kg).  Physical Exam Physical Exam  Constitutional: She is oriented to person, place, and time. No distress.  Obese female  HENT:  Head: Normocephalic and atraumatic.  Right Ear: External ear normal.  Left Ear: External ear normal.  Nose: Nose normal.  Mouth/Throat: Oropharynx is clear and moist. No oropharyngeal exudate.  Eyes: Conjunctivae are normal. Right eye exhibits no discharge. Left eye exhibits no discharge. No scleral icterus.  Neck: Normal range of motion. Neck supple. No tracheal deviation present. No thyromegaly present.  Cardiovascular: Normal rate, regular rhythm, normal heart sounds and intact distal pulses.   No murmur heard. Pulmonary/Chest: Effort normal and breath sounds normal. No respiratory distress. She has no wheezes.  Abdominal: Soft. Bowel sounds are normal. She  exhibits no distension. There is no tenderness.  Moderate-sized reducible hernia above the umbilicus  Musculoskeletal: Normal range of motion. She exhibits no edema and no tenderness.  Lymphadenopathy:    She has no cervical adenopathy.  Neurological: She is alert and oriented to person, place, and time.  Skin: Skin is warm and dry. No rash noted. She is not diaphoretic. No erythema.  Psychiatric: Her behavior is normal. Judgment normal.    Data Reviewed I have reviewed her CAT scan demonstrating the ventral hernia containing omentum  Assessment    Ventral incisional hernia     Plan    Given the fact he does have a small laparoscopic incision at the umbilicus, I do believe this is an incisional hernia. The fascial defects feels moderate in size. Repair with mesh was recommended. I discussed with the laparoscopic and open techniques. I discussed the risks of surgery which includes but is not limited to bleeding, infection, injury to stranding structures, need to convert to an open procedure, recurrence, etc. She was to proceed with laparoscopic repair with mesh.  this will be scheduled.       Bensen Chadderdon A 12/26/2012, 4:34 PM

## 2012-12-27 ENCOUNTER — Telehealth (INDEPENDENT_AMBULATORY_CARE_PROVIDER_SITE_OTHER): Payer: Self-pay | Admitting: Surgery

## 2012-12-27 NOTE — Telephone Encounter (Signed)
12/26/12 Spoke w/pt in Surgery Scheduling regarding financial responsibility.  Patient states she will call back once she has deposit.

## 2012-12-31 ENCOUNTER — Encounter: Payer: Self-pay | Admitting: Family Medicine

## 2012-12-31 ENCOUNTER — Other Ambulatory Visit: Payer: Self-pay | Admitting: Family Medicine

## 2012-12-31 NOTE — Telephone Encounter (Signed)
She doesn't need refills yet but needs prior auth apparently.  See what we need to do to get prior authorization.  This is usually not required of generic drugs.

## 2013-01-01 ENCOUNTER — Encounter: Payer: Self-pay | Admitting: Family Medicine

## 2013-01-07 ENCOUNTER — Encounter: Payer: Self-pay | Admitting: Family Medicine

## 2013-01-10 ENCOUNTER — Other Ambulatory Visit: Payer: Self-pay | Admitting: Family Medicine

## 2013-01-11 ENCOUNTER — Telehealth: Payer: Self-pay | Admitting: Family Medicine

## 2013-01-14 ENCOUNTER — Encounter: Payer: Self-pay | Admitting: Family Medicine

## 2013-01-14 MED ORDER — AMPHETAMINE-DEXTROAMPHETAMINE 20 MG PO TABS: 20.0000 mg | ORAL_TABLET | Freq: Two times a day (BID) | ORAL | Status: DC

## 2013-01-14 NOTE — Telephone Encounter (Signed)
Pt has sent this message via mychart but cannot wait any longer. Pt only got 1/2 rx filled due to prior author need. Now its been approved, but pt needs another RX for 30 days. Pt was trying med and would like 90 days if possible.

## 2013-01-14 NOTE — Telephone Encounter (Signed)
Per [facility]chette refilled for #60.Marland KitchenMarland KitchenMarland Kitchen

## 2013-01-24 ENCOUNTER — Other Ambulatory Visit: Payer: Self-pay

## 2013-01-24 ENCOUNTER — Encounter: Payer: Self-pay | Admitting: Family Medicine

## 2013-01-24 MED ORDER — PANTOPRAZOLE SODIUM 20 MG PO TBEC: 20.0000 mg | DELAYED_RELEASE_TABLET | Freq: Every day | ORAL | Status: DC

## 2013-02-15 ENCOUNTER — Telehealth: Payer: Self-pay | Admitting: Family Medicine

## 2013-02-15 MED ORDER — AMPHETAMINE-DEXTROAMPHETAMINE 20 MG PO TABS: ORAL_TABLET | ORAL | Status: DC

## 2013-02-15 MED ORDER — AMPHETAMINE-DEXTROAMPHETAMINE 20 MG PO TABS: 20.0000 mg | ORAL_TABLET | Freq: Two times a day (BID) | ORAL | Status: DC

## 2013-02-15 NOTE — Telephone Encounter (Signed)
Left message for Pt rx at the front for pickup

## 2013-02-15 NOTE — Telephone Encounter (Signed)
Pt needs new rx generic adderall 20 mg twice a day #60 °

## 2013-02-15 NOTE — Telephone Encounter (Signed)
Last refill 01/14/13 #60 no refills Last visit 12/26/12

## 2013-02-15 NOTE — Telephone Encounter (Signed)
Refill for 3 months. 

## 2013-03-07 ENCOUNTER — Telehealth: Payer: Self-pay

## 2013-03-07 ENCOUNTER — Ambulatory Visit: Payer: 59 | Admitting: Family Medicine

## 2013-03-07 NOTE — Telephone Encounter (Signed)
Do you want to charge patient for not coming to appointment. Pt stated that she forgot about her appointment and that she is doing fine, she has a lot going on and she starts a new job next week. Pt stated that she will call back and set up appointment when she finds out her new schedule.

## 2013-03-13 ENCOUNTER — Telehealth: Payer: Self-pay | Admitting: Family Medicine

## 2013-03-13 NOTE — Telephone Encounter (Signed)
Pt leaving to go out of town on Sunday, she will be out of town for a week and will run out of medication before the week is out.  Pt is requesting an early refill of amphetamine-dextroamphetamine (ADDERALL) 20 MG tablet.

## 2013-03-14 NOTE — Telephone Encounter (Signed)
Refill OK

## 2013-03-14 NOTE — Telephone Encounter (Signed)
Called and informed [facility] the approval to refill the medication early

## 2013-03-14 NOTE — Telephone Encounter (Signed)
Last refill 02/15/13  #60 2 refills  Please Advise

## 2013-03-21 ENCOUNTER — Ambulatory Visit (INDEPENDENT_AMBULATORY_CARE_PROVIDER_SITE_OTHER): Payer: 59 | Admitting: Family Medicine

## 2013-03-21 ENCOUNTER — Encounter: Payer: Self-pay | Admitting: Family Medicine

## 2013-03-21 VITALS — BP 132/76 | HR 79 | Temp 98.0°F | Wt 226.0 lb

## 2013-03-21 DIAGNOSIS — R300 Dysuria: Secondary | ICD-10-CM

## 2013-03-21 DIAGNOSIS — R319 Hematuria, unspecified: Secondary | ICD-10-CM

## 2013-03-21 LAB — POCT URINALYSIS DIPSTICK
Bilirubin, UA: NEGATIVE
Glucose, UA: NEGATIVE
Nitrite, UA: NEGATIVE
Spec Grav, UA: 1.025
Urobilinogen, UA: 0.2

## 2013-03-21 MED ORDER — CIPROFLOXACIN HCL 500 MG PO TABS: 500.0000 mg | ORAL_TABLET | Freq: Two times a day (BID) | ORAL | Status: DC

## 2013-03-21 NOTE — Patient Instructions (Addendum)
Urinary Tract Infection  Urinary tract infections (UTIs) can develop anywhere along your urinary tract. Your urinary tract is your body's drainage system for removing wastes and extra water. Your urinary tract includes two kidneys, two ureters, a bladder, and a urethra. Your kidneys are a pair of bean-shaped organs. Each kidney is about the size of your fist. They are located below your ribs, one on each side of your spine.  CAUSES  Infections are caused by microbes, which are microscopic organisms, including fungi, viruses, and bacteria. These organisms are so small that they can only be seen through a microscope. Bacteria are the microbes that most commonly cause UTIs.  SYMPTOMS   Symptoms of UTIs may vary by age and gender of the patient and by the location of the infection. Symptoms in young women typically include a frequent and intense urge to urinate and a painful, burning feeling in the bladder or urethra during urination. Older women and men are more likely to be tired, shaky, and weak and have muscle aches and abdominal pain. A fever may mean the infection is in your kidneys. Other symptoms of a kidney infection include pain in your back or sides below the ribs, nausea, and vomiting.  DIAGNOSIS  To diagnose a UTI, your caregiver will ask you about your symptoms. Your caregiver also will ask to provide a urine sample. The urine sample will be tested for bacteria and white blood cells. White blood cells are made by your body to help fight infection.  TREATMENT   Typically, UTIs can be treated with medication. Because most UTIs are caused by a bacterial infection, they usually can be treated with the use of antibiotics. The choice of antibiotic and length of treatment depend on your symptoms and the type of bacteria causing your infection.  HOME CARE INSTRUCTIONS   If you were prescribed antibiotics, take them exactly as your caregiver instructs you. Finish the medication even if you feel better after you  have only taken some of the medication.   Drink enough water and fluids to keep your urine clear or pale yellow.   Avoid caffeine, tea, and carbonated beverages. They tend to irritate your bladder.   Empty your bladder often. Avoid holding urine for long periods of time.   Empty your bladder before and after sexual intercourse.   After a bowel movement, women should cleanse from front to back. Use each tissue only once.  SEEK MEDICAL CARE IF:    You have back pain.   You develop a fever.   Your symptoms do not begin to resolve within 3 days.  SEEK IMMEDIATE MEDICAL CARE IF:    You have severe back pain or lower abdominal pain.   You develop chills.   You have nausea or vomiting.   You have continued burning or discomfort with urination.  MAKE SURE YOU:    Understand these instructions.   Will watch your condition.   Will get help right away if you are not doing well or get worse.  Document Released: 03/23/2005 Document Revised: 12/13/2011 Document Reviewed: 07/22/2011  [employer] Patient Information 2014 [employer].

## 2013-03-21 NOTE — Progress Notes (Signed)
  Subjective:    Patient ID: [name] Sherman, female    DOB: 06-24-1961, 52 y.o.   MRN: [id number]  HPI Acute visit for dysuria. Onset 3 days ago. She's had some mild frequency with urination but mostly burning at the end of urination. She has some gross blood intermittently with urination. She's had previous hysterectomy. She's had some poorly localized lower lumbar back pain. No flank pain. No nausea or vomiting. No fever or chills. No recent UTI.  She's had remote history of UTI in the past. She in general takes in very little fluids per day according to her husband.   Review of Systems  Constitutional: Negative for fever, chills and appetite change.  Gastrointestinal: Positive for nausea. Negative for vomiting, abdominal pain, diarrhea and constipation.  Genitourinary: Positive for dysuria, frequency and hematuria. Negative for vaginal bleeding.  Musculoskeletal: Negative for back pain.  Neurological: Negative for dizziness.       Objective:   Physical Exam  Constitutional: She appears well-developed and well-nourished.  Cardiovascular: Normal rate, regular rhythm and normal heart sounds.   No murmur heard. Pulmonary/Chest: Breath sounds normal. No respiratory distress. She has no wheezes. She has no rales.  Abdominal: Soft. Bowel sounds are normal. She exhibits no distension. There is no tenderness.  Psychiatric: She has a normal mood and affect. Her behavior is normal. Judgment and thought content normal.          Assessment & Plan:  Dysuria. Suspect uncomplicated cystitis. Urine culture sent. Cipro 500  mg twice a day for 5 days. Suspect for hematuria is related to infection.

## 2013-03-23 LAB — URINE CULTURE

## 2013-03-24 ENCOUNTER — Encounter: Payer: Self-pay | Admitting: Family Medicine

## 2013-05-02 ENCOUNTER — Other Ambulatory Visit: Payer: Self-pay

## 2013-05-13 ENCOUNTER — Telehealth: Payer: Self-pay | Admitting: Family Medicine

## 2013-05-13 MED ORDER — AMPHETAMINE-DEXTROAMPHETAMINE 20 MG PO TABS: ORAL_TABLET | ORAL | Status: DC

## 2013-05-13 MED ORDER — AMPHETAMINE-DEXTROAMPHETAMINE 20 MG PO TABS: 20.0000 mg | ORAL_TABLET | Freq: Two times a day (BID) | ORAL | Status: DC

## 2013-05-13 NOTE — Telephone Encounter (Signed)
Pt. advised rx ready for pick up 

## 2013-05-13 NOTE — Telephone Encounter (Signed)
Pt needs new rx generic adderall 20 mg #60. Pt needs 3 separate rx for next 3 months.

## 2013-09-06 ENCOUNTER — Telehealth: Payer: Self-pay | Admitting: Family Medicine

## 2013-09-06 DIAGNOSIS — Z79899 Other long term (current) drug therapy: Secondary | ICD-10-CM | POA: Insufficient documentation

## 2013-09-06 DIAGNOSIS — E559 Vitamin D deficiency, unspecified: Secondary | ICD-10-CM | POA: Insufficient documentation

## 2013-09-06 DIAGNOSIS — K219 Gastro-esophageal reflux disease without esophagitis: Secondary | ICD-10-CM | POA: Insufficient documentation

## 2013-09-06 DIAGNOSIS — J069 Acute upper respiratory infection, unspecified: Secondary | ICD-10-CM | POA: Insufficient documentation

## 2013-09-06 DIAGNOSIS — J40 Bronchitis, not specified as acute or chronic: Secondary | ICD-10-CM | POA: Insufficient documentation

## 2013-09-06 DIAGNOSIS — G43909 Migraine, unspecified, not intractable, without status migrainosus: Secondary | ICD-10-CM | POA: Insufficient documentation

## 2013-09-06 DIAGNOSIS — H748X9 Other specified disorders of middle ear and mastoid, unspecified ear: Secondary | ICD-10-CM | POA: Insufficient documentation

## 2013-09-06 DIAGNOSIS — E7800 Pure hypercholesterolemia, unspecified: Secondary | ICD-10-CM | POA: Insufficient documentation

## 2013-09-06 DIAGNOSIS — Z792 Long term (current) use of antibiotics: Secondary | ICD-10-CM | POA: Insufficient documentation

## 2013-09-06 DIAGNOSIS — Z8541 Personal history of malignant neoplasm of cervix uteri: Secondary | ICD-10-CM | POA: Insufficient documentation

## 2013-09-06 DIAGNOSIS — M940 Chondrocostal junction syndrome [Tietze]: Secondary | ICD-10-CM | POA: Insufficient documentation

## 2013-09-06 NOTE — Telephone Encounter (Signed)
Pt is needing new rx amphetamine-dextroamphetamine (ADDERALL) 20 MG tablet, please call when available for pick up. ° °

## 2013-09-06 NOTE — Telephone Encounter (Signed)
Last visit 03/21/13 Last refill 05/13/13 #60 2 refills

## 2013-09-07 ENCOUNTER — Encounter (HOSPITAL_COMMUNITY): Payer: Self-pay | Admitting: Emergency Medicine

## 2013-09-07 ENCOUNTER — Emergency Department (HOSPITAL_COMMUNITY)
Admission: EM | Admit: 2013-09-07 | Discharge: 2013-09-07 | Disposition: A | Discharge status: Home / Self Care | Payer: Self-pay | Attending: Emergency Medicine | Admitting: Emergency Medicine

## 2013-09-07 ENCOUNTER — Emergency Department (HOSPITAL_COMMUNITY): Payer: Self-pay

## 2013-09-07 DIAGNOSIS — J40 Bronchitis, not specified as acute or chronic: Secondary | ICD-10-CM

## 2013-09-07 DIAGNOSIS — M940 Chondrocostal junction syndrome [Tietze]: Secondary | ICD-10-CM

## 2013-09-07 DIAGNOSIS — J069 Acute upper respiratory infection, unspecified: Secondary | ICD-10-CM

## 2013-09-07 DIAGNOSIS — B9789 Other viral agents as the cause of diseases classified elsewhere: Secondary | ICD-10-CM

## 2013-09-07 MED ORDER — HYDROCODONE-HOMATROPINE 5-1.5 MG/5ML PO SYRP: 5.0000 mL | ORAL_SOLUTION | Freq: Four times a day (QID) | ORAL | Status: DC | PRN

## 2013-09-07 MED ORDER — OXYMETAZOLINE HCL 0.05 % NA SOLN
1.0000 | Freq: Once | NASAL | Status: AC
Start: 2013-09-07 — End: 2013-09-07
  Administered 2013-09-07: 1 via NASAL
  Filled 2013-09-07: qty 15

## 2013-09-07 MED ORDER — ALBUTEROL SULFATE HFA 108 (90 BASE) MCG/ACT IN AERS
2.0000 | INHALATION_SPRAY | Freq: Once | RESPIRATORY_TRACT | Status: AC
  Administered 2013-09-07: 2 via RESPIRATORY_TRACT
  Filled 2013-09-07: qty 6.7

## 2013-09-07 MED ORDER — FLUTICASONE PROPIONATE 50 MCG/ACT NA SUSP: 2.0000 | Freq: Every day | NASAL | Status: DC

## 2013-09-07 MED ORDER — HYDROCODONE-HOMATROPINE 5-1.5 MG/5ML PO SYRP
5.0000 mL | ORAL_SOLUTION | Freq: Once | ORAL | Status: AC
  Administered 2013-09-07: 5 mL via ORAL
  Filled 2013-09-07: qty 5

## 2013-09-07 MED ORDER — PREDNISONE 20 MG PO TABS: 40.0000 mg | ORAL_TABLET | Freq: Every day | ORAL | Status: DC

## 2013-09-07 NOTE — Discharge Instructions (Signed)
Your Xray is negative for pneumonia. Recommend albuterol inhaler 2 puffs every 4 hours as needed for shortness of breath. Take prednisone as prescribed for chest tightness and shortness of breath. Take Flonase for nasal congestion; you may also try nasal saline sprays or sinus rinses. Use Hycodan as needed for cough. You may also try Mucinex DM. Follow up with your primary doctor. Return if symptoms worsen.  Upper Respiratory Infection, Adult An upper respiratory infection (URI) is also known as the common cold. It is often caused by a type of germ (virus). Colds are easily spread (contagious). You can pass it to others by kissing, coughing, sneezing, or drinking out of the same glass. Usually, you get better in 1 or 2 weeks.  HOME CARE   Only take medicine as told by your doctor.  Use a warm mist humidifier or breathe in steam from a hot shower.  Drink enough water and fluids to keep your pee (urine) clear or pale yellow.  Get plenty of rest.  Return to work when your temperature is back to normal or as told by your doctor. You may use a face mask and wash your hands to stop your cold from spreading. GET HELP RIGHT AWAY IF:   After the first few days, you feel you are getting worse.  You have questions about your medicine.  You have chills, shortness of breath, or brown or red spit (mucus).  You have yellow or brown snot (nasal discharge) or pain in the face, especially when you bend forward.  You have a fever, puffy (swollen) neck, pain when you swallow, or white spots in the back of your throat.  You have a bad headache, ear pain, sinus pain, or chest pain.  You have a high-pitched whistling sound when you breathe in and out (wheezing).  You have a lasting cough or cough up blood.  You have sore muscles or a stiff neck. MAKE SURE YOU:   Understand these instructions.  Will watch your condition.  Will get help right away if you are not doing well or get worse. Document  Released: 11/30/2007 Document Revised: 09/05/2011 Document Reviewed: 10/18/2010 [facility] Patient Information 2014 [place], [place].

## 2013-09-07 NOTE — ED Provider Notes (Signed)
Medical screening examination/treatment/procedure(s) were performed by non-physician practitioner and as supervising physician I was immediately available for consultation/collaboration.   EKG Interpretation None       Kalman Drape, MD 09/07/13 [phone]

## 2013-09-07 NOTE — ED Provider Notes (Signed)
CSN: [id number]     Arrival date & time 09/06/13  2352 History   First MD Initiated Contact with Patient 09/07/13 0025     Chief Complaint  Patient presents with  . Nasal Congestion  . Cough     (Consider location/radiation/quality/duration/timing/severity/associated sxs/prior Treatment) Patient is a 53 y.o. female presenting with cough. The history is provided by the patient. No language interpreter was used.  Cough Cough characteristics:  Productive Sputum characteristics:  Green and yellow Severity:  Moderate Onset quality:  Gradual Duration:  4 weeks Timing:  Intermittent Progression:  Worsening Chronicity:  New Smoker: no   Context: upper respiratory infection   Relieved by:  Nothing Worsened by:  Deep breathing Ineffective treatments:  Cough suppressants, rest, decongestant and fluids Associated symptoms: chills, ear fullness, rhinorrhea, shortness of breath, sinus congestion and sore throat   Associated symptoms: no ear pain, no eye discharge, no fever, no rash and no wheezing   Risk factors: no recent travel     Past Medical History  Diagnosis Date  . Migraine   . Hypercholesteremia   . Vitamin D deficiency   . GERD (gastroesophageal reflux disease)   . Cervical cancer    Past Surgical History  Procedure Laterality Date  . Laser ablation    . Bunionectomy    . Abdominal hysterectomy  age 40    menorrhagia   Family History  Problem Relation Age of Onset  . Alcohol abuse Mother   . Arthritis Father   . Hyperlipidemia Father   . Heart disease Father   . Hypertension Father   . Kidney disease Paternal Uncle   . Cancer Maternal Grandmother     colon  . Cancer Paternal Grandmother     breast   History  Substance Use Topics  . Smoking status: Never Smoker   . Smokeless tobacco: Not on file  . Alcohol Use: Yes     Comment: 1 x year   OB History   Grav Para Term Preterm Abortions TAB SAB Ect Mult Living                  Review of Systems   Constitutional: Positive for chills. Negative for fever.  HENT: Positive for congestion, postnasal drip, rhinorrhea, sinus pressure and sore throat. Negative for ear pain and trouble swallowing.   Eyes: Negative for discharge.  Respiratory: Positive for cough, chest tightness and shortness of breath. Negative for wheezing.   Gastrointestinal: Positive for vomiting (posttussive and nonbloody).  Skin: Negative for rash.  All other systems reviewed and are negative.     Allergies  Review of patient's allergies indicates no known allergies.  Home Medications   Current Outpatient Rx  Name  Route  Sig  Dispense  Refill  . amphetamine-dextroamphetamine (ADDERALL) 20 MG tablet   Oral   Take 1 tablet (20 mg total) by mouth 2 (two) times daily.   60 tablet   0   . amphetamine-dextroamphetamine (ADDERALL) 20 MG tablet      Take 1 tablet by mouth 2 times daily.. May refill in one month   60 tablet   0   . amphetamine-dextroamphetamine (ADDERALL) 20 MG tablet      Take 1 tablet by mouth 2 times daily.. May refill in two months   60 tablet   0   . atorvastatin (LIPITOR) 10 MG tablet   Oral   Take 1 tablet (10 mg total) by mouth daily.   90 tablet   3   .  ciprofloxacin (CIPRO) 500 MG tablet   Oral   Take 1 tablet (500 mg total) by mouth 2 (two) times daily.   10 tablet   0   . citalopram (CELEXA) 20 MG tablet   Oral   Take 1 tablet (20 mg total) by mouth daily. Taking 3 tabs at HS   270 tablet   3   . pantoprazole (PROTONIX) 20 MG tablet   Oral   Take 1 tablet (20 mg total) by mouth daily.   90 tablet   3   . Vitamin D, Ergocalciferol, (DRISDOL) 50000 UNITS CAPS   Oral   Take 1 capsule (50,000 Units total) by mouth every 7 (seven) days.   16 capsule   3    BP 140/99  Pulse 96  Temp(Src) 99.1 F (37.3 C) (Oral)  Resp 17  SpO2 99%  Physical Exam  Nursing note and vitals reviewed. Constitutional: She is oriented to person, place, and time. She appears  well-developed and well-nourished. No distress.  HENT:  Head: Normocephalic and atraumatic.  Right Ear: External ear and ear canal normal. Tympanic membrane is not retracted and not bulging. A middle ear effusion is present.  Left Ear: External ear and ear canal normal. Tympanic membrane is not retracted and not bulging. A middle ear effusion is present.  Nose: Rhinorrhea (clear) present.  Mouth/Throat: Uvula is midline, oropharynx is clear and moist and mucous membranes are normal. No trismus in the jaw. No uvula swelling. No oropharyngeal exudate, posterior oropharyngeal edema or posterior oropharyngeal erythema.  +sinus congestion  Eyes: Conjunctivae and EOM are normal. Pupils are equal, round, and reactive to light. No scleral icterus.  Neck: Normal range of motion. Neck supple.  Cardiovascular: Normal rate, regular rhythm and normal heart sounds.   Pulmonary/Chest: Effort normal and breath sounds normal. No respiratory distress. She has no wheezes. She has no rales.  Abdominal: Soft. She exhibits no distension. There is no tenderness.  Musculoskeletal: Normal range of motion.  Neurological: She is alert and oriented to person, place, and time.  Skin: Skin is warm and dry. No rash noted. She is not diaphoretic. No erythema. No pallor.  Psychiatric: She has a normal mood and affect. Her behavior is normal.    ED Course  Procedures (including critical care time) Labs Review Labs Reviewed - No data to display Imaging Review Dg Chest 2 View  09/07/2013   CLINICAL DATA:  Cough and short of breath  EXAM: CHEST  2 VIEW  COMPARISON:  None.  FINDINGS: The heart size and mediastinal contours are within normal limits. Both lungs are clear. The visualized skeletal structures are unremarkable.  IMPRESSION: No active cardiopulmonary disease.   Electronically Signed   By: Franchot Gallo M.D.   On: 09/07/2013 00:56     EKG Interpretation None      MDM   Final diagnoses:  Viral URI with cough   Bronchitis  Costochondritis    Uncomplicated URI with cough, bronchitis, and costochondral pain from coughing. Patient nontoxic and nonseptic appearing, hemodynamically stable, and afebrile. Lungs clear to auscultation bilaterally inpatient without retractions or accessory muscle use. Physical exam significant for nasal congestion and clear rhinorrhea. Chest x-ray shows no evidence of focal consolidation or pneumonia. Suspect viral process.  Symptoms treated in ED with Hycodan and Afrin. Patient stable and appropriate for discharge with prescription for prednisone Dosepak, Hycodan, and Flonase. Also given patient's albuterol inhaler to use as needed for shortness of breath. Rest and fluids advised and return  precautions discussed. Patient agreeable to plan with no unaddressed concerns.   Filed Vitals:   09/07/13 0016  BP: 140/99  Pulse: 96  Temp: 99.1 F (37.3 C)  TempSrc: Oral  Resp: 17  SpO2: 99%       Antonietta Breach, PA-C 09/07/13 [phone]

## 2013-09-07 NOTE — ED Notes (Signed)
Pt c/o cough, chest congestion, throat pain, headache. Symptoms for one month. Pt alert, no acute distress. Skin warm and dry.

## 2013-09-08 NOTE — Telephone Encounter (Signed)
Refill OK

## 2013-09-09 MED ORDER — AMPHETAMINE-DEXTROAMPHETAMINE 20 MG PO TABS: ORAL_TABLET | ORAL | Status: DC

## 2013-09-09 MED ORDER — AMPHETAMINE-DEXTROAMPHETAMINE 20 MG PO TABS: 20.0000 mg | ORAL_TABLET | Freq: Two times a day (BID) | ORAL | Status: DC

## 2013-09-09 NOTE — Telephone Encounter (Signed)
RX ready for pick up, pt is aware

## 2013-12-13 ENCOUNTER — Telehealth: Payer: Self-pay | Admitting: Family Medicine

## 2013-12-13 NOTE — Telephone Encounter (Signed)
Pt request 3 mo amphetamine-dextroamphetamine (ADDERALL) 20 MG tablet

## 2013-12-16 MED ORDER — AMPHETAMINE-DEXTROAMPHETAMINE 20 MG PO TABS: ORAL_TABLET | ORAL | Status: DC

## 2013-12-16 MED ORDER — AMPHETAMINE-DEXTROAMPHETAMINE 20 MG PO TABS: 20.0000 mg | ORAL_TABLET | Freq: Two times a day (BID) | ORAL | Status: DC

## 2013-12-16 NOTE — Telephone Encounter (Signed)
Last visit 03/21/13 Last refill 09/09/13 #60 2 refills

## 2013-12-16 NOTE — Telephone Encounter (Signed)
Left detailed message on Pt VM that Rx is ready for pick up

## 2013-12-16 NOTE — Telephone Encounter (Signed)
Refill OK

## 2014-01-09 ENCOUNTER — Ambulatory Visit: Payer: Self-pay | Admitting: Family Medicine

## 2014-01-14 ENCOUNTER — Ambulatory Visit: Payer: Self-pay | Admitting: Family Medicine

## 2014-01-14 DIAGNOSIS — Z0289 Encounter for other administrative examinations: Secondary | ICD-10-CM

## 2014-03-14 ENCOUNTER — Encounter: Payer: Self-pay | Admitting: Family Medicine

## 2014-03-14 ENCOUNTER — Ambulatory Visit (INDEPENDENT_AMBULATORY_CARE_PROVIDER_SITE_OTHER): Payer: Self-pay | Admitting: Family Medicine

## 2014-03-14 VITALS — BP 130/80 | HR 74 | Temp 98.2°F | Wt 200.0 lb

## 2014-03-14 DIAGNOSIS — R300 Dysuria: Secondary | ICD-10-CM

## 2014-03-14 LAB — POCT URINALYSIS DIPSTICK
BILIRUBIN UA: NEGATIVE
Glucose, UA: NEGATIVE
Ketones, UA: NEGATIVE
Leukocytes, UA: NEGATIVE
Nitrite, UA: NEGATIVE
Protein, UA: NEGATIVE
RBC UA: NEGATIVE
SPEC GRAV UA: 1.025
Urobilinogen, UA: 0.2
pH, UA: 6

## 2014-03-14 MED ORDER — ATORVASTATIN CALCIUM 10 MG PO TABS: 10.0000 mg | ORAL_TABLET | Freq: Every day | ORAL | Status: DC

## 2014-03-14 MED ORDER — CITALOPRAM HYDROBROMIDE 20 MG PO TABS: 20.0000 mg | ORAL_TABLET | Freq: Every day | ORAL | Status: DC

## 2014-03-14 MED ORDER — AMPHETAMINE-DEXTROAMPHETAMINE 20 MG PO TABS: ORAL_TABLET | ORAL | Status: DC

## 2014-03-14 MED ORDER — VITAMIN D (ERGOCALCIFEROL) 1.25 MG (50000 UNIT) PO CAPS: 50000.0000 [IU] | ORAL_CAPSULE | ORAL | Status: DC

## 2014-03-14 MED ORDER — AMPHETAMINE-DEXTROAMPHETAMINE 20 MG PO TABS: 20.0000 mg | ORAL_TABLET | Freq: Two times a day (BID) | ORAL | Status: DC

## 2014-03-14 NOTE — Patient Instructions (Signed)
Insomnia Insomnia is frequent trouble falling and/or staying asleep. Insomnia can be a long term problem or a short term problem. Both are common. Insomnia can be a short term problem when the wakefulness is related to a certain stress or worry. Long term insomnia is often related to ongoing stress during waking hours and/or poor sleeping habits. Overtime, sleep deprivation itself can make the problem worse. Every little thing feels more severe because you are overtired and your ability to cope is decreased. CAUSES   Stress, anxiety, and depression.  Poor sleeping habits.  Distractions such as TV in the bedroom.  Naps close to bedtime.  Engaging in emotionally charged conversations before bed.  Technical reading before sleep.  Alcohol and other sedatives. They may make the problem worse. They can hurt normal sleep patterns and normal dream activity.  Stimulants such as caffeine for several hours prior to bedtime.  Pain syndromes and shortness of breath can cause insomnia.  Exercise late at night.  Changing time zones may cause sleeping problems (jet lag). It is sometimes helpful to have someone observe your sleeping patterns. They should look for periods of not breathing during the night (sleep apnea). They should also look to see how long those periods last. If you live alone or observers are uncertain, you can also be observed at a sleep clinic where your sleep patterns will be professionally monitored. Sleep apnea requires a checkup and treatment. Give your caregivers your medical history. Give your caregivers observations your family has made about your sleep.  SYMPTOMS   Not feeling rested in the morning.  Anxiety and restlessness at bedtime.  Difficulty falling and staying asleep. TREATMENT   Your caregiver may prescribe treatment for an underlying medical disorders. Your caregiver can give advice or help if you are using alcohol or other drugs for self-medication. Treatment  of underlying problems will usually eliminate insomnia problems.  Medications can be prescribed for short time use. They are generally not recommended for lengthy use.  Over-the-counter sleep medicines are not recommended for lengthy use. They can be habit forming.  You can promote easier sleeping by making lifestyle changes such as:  Using relaxation techniques that help with breathing and reduce muscle tension.  Exercising earlier in the day.  Changing your diet and the time of your last meal. No night time snacks.  Establish a regular time to go to bed.  Counseling can help with stressful problems and worry.  Soothing music and white noise may be helpful if there are background noises you cannot remove.  Stop tedious detailed work at least one hour before bedtime. HOME CARE INSTRUCTIONS   Keep a diary. Inform your caregiver about your progress. This includes any medication side effects. See your caregiver regularly. Take note of:  Times when you are asleep.  Times when you are awake during the night.  The quality of your sleep.  How you feel the next day. This information will help your caregiver care for you.  Get out of bed if you are still awake after 15 minutes. Read or do some quiet activity. Keep the lights down. Wait until you feel sleepy and go back to bed.  Keep regular sleeping and waking hours. Avoid naps.  Exercise regularly.  Avoid distractions at bedtime. Distractions include watching television or engaging in any intense or detailed activity like attempting to balance the household checkbook.  Develop a bedtime ritual. Keep a familiar routine of bathing, brushing your teeth, climbing into bed at the same   time each night, listening to soothing music. Routines increase the success of falling to sleep faster.  Use relaxation techniques. This can be using breathing and muscle tension release routines. It can also include visualizing peaceful scenes. You can  also help control troubling or intruding thoughts by keeping your mind occupied with boring or repetitive thoughts like the old concept of counting sheep. You can make it more creative like imagining planting one beautiful flower after another in your backyard garden.  During your day, work to eliminate stress. When this is not possible use some of the previous suggestions to help reduce the anxiety that accompanies stressful situations. MAKE SURE YOU:   Understand these instructions.  Will watch your condition.  Will get help right away if you are not doing well or get worse. Document Released: 06/10/2000 Document Revised: 09/05/2011 Document Reviewed: 07/11/2007 [facility] Patient Information 2015 [place], [place]. This information is not intended to replace advice given to you by your health care provider. Make sure you discuss any questions you have with your health care provider.  Consider over the counter melatonin

## 2014-03-14 NOTE — Progress Notes (Signed)
Pre visit review using our clinic review tool, if applicable. No additional management support is needed unless otherwise documented below in the visit note. 

## 2014-03-14 NOTE — Progress Notes (Signed)
   Subjective:    Patient ID: [name] Sherman, female    DOB: 01-Mar-1961, 53 y.o.   MRN: [id number]  HPI Patient is seen for several items  Several week hx of "foul smelling"  urine. No burning with urination. Urine has been somewhat dark. No gross hematuria. She was concerned about possible infection but again has not had any burning. No vaginal discharge. No new change of medications. Sometimes does not drink enough fluids  Requesting refill of Adderall. This has been working well for her. She also needs refills of Celexa which is taken for depression. She is currently going through separation and has been stressful. She has insomnia issues.  Is able to fall asleep but frequently wakes up after a couple hours and can't get back to sleep. She is also working third shift recently which is presenting some problems. Does not use much caffeine. Has tried Benadryl but this made her more hyper. She took Ambien once but had some questionable memory loss. She has not tried melatonin  Past Medical History  Diagnosis Date  . Migraine   . Hypercholesteremia   . Vitamin D deficiency   . GERD (gastroesophageal reflux disease)   . Cervical cancer    Past Surgical History  Procedure Laterality Date  . Laser ablation    . Bunionectomy    . Abdominal hysterectomy  age 88    menorrhagia    reports that she has never smoked. She does not have any smokeless tobacco history on file. She reports that she drinks alcohol. She reports that she does not use illicit drugs. family history includes Alcohol abuse in her mother; Arthritis in her father; Cancer in her maternal grandmother and paternal grandmother; Heart disease in her father; Hyperlipidemia in her father; Hypertension in her father; Kidney disease in her paternal uncle. No Known Allergies    Review of Systems  Constitutional: Negative for fever and chills.  Respiratory: Negative for cough and shortness of breath.   Cardiovascular: Negative for  chest pain.  Gastrointestinal: Negative for abdominal pain.  Genitourinary: Positive for dysuria. Negative for hematuria and flank pain.       Objective:   Physical Exam  Constitutional: She appears well-developed and well-nourished.  Cardiovascular: Normal rate and regular rhythm.   Pulmonary/Chest: Effort normal and breath sounds normal. No respiratory distress. She has no wheezes. She has no rales.  Musculoskeletal: She exhibits no edema.          Assessment & Plan:  #1 dysuria. Urine dipstick is normal. No signs of UTI. Increase hydration. #2 insomnia. Probably related to third shift and recent stress issues. Sleep hygiene discussed with handout given. She has not tolerated Benadryl. Consider over-the-counter melatonin. #3 attention deficit disorder. Refill Adderall #4 history of depression. Stable. Refill Celexa

## 2014-04-11 ENCOUNTER — Other Ambulatory Visit: Payer: Self-pay

## 2014-08-11 ENCOUNTER — Telehealth: Payer: Self-pay

## 2014-08-11 NOTE — Telephone Encounter (Signed)
Refills OK. 

## 2014-08-11 NOTE — Telephone Encounter (Signed)
Last visit   03/14/14  Last refill 03/14/14 #60 2 refill  

## 2014-08-11 NOTE — Telephone Encounter (Signed)
[facility] refill request for ADDERALL 20MG  GENERIC

## 2014-08-12 MED ORDER — AMPHETAMINE-DEXTROAMPHETAMINE 20 MG PO TABS: ORAL_TABLET | ORAL | Status: DC

## 2014-08-12 MED ORDER — AMPHETAMINE-DEXTROAMPHETAMINE 20 MG PO TABS: 20.0000 mg | ORAL_TABLET | Freq: Two times a day (BID) | ORAL | Status: DC

## 2014-08-12 NOTE — Telephone Encounter (Signed)
Left message on VM that Rx is ready for pick up 

## 2014-10-30 ENCOUNTER — Encounter: Payer: Self-pay | Admitting: Family Medicine

## 2014-10-30 ENCOUNTER — Ambulatory Visit (INDEPENDENT_AMBULATORY_CARE_PROVIDER_SITE_OTHER): Payer: 59 | Admitting: Family Medicine

## 2014-10-30 VITALS — BP 140/90 | HR 75 | Temp 98.2°F | Wt 210.0 lb

## 2014-10-30 DIAGNOSIS — N898 Other specified noninflammatory disorders of vagina: Secondary | ICD-10-CM

## 2014-10-30 DIAGNOSIS — R300 Dysuria: Secondary | ICD-10-CM

## 2014-10-30 DIAGNOSIS — R202 Paresthesia of skin: Secondary | ICD-10-CM

## 2014-10-30 LAB — POCT URINALYSIS DIPSTICK
Bilirubin, UA: NEGATIVE
Glucose, UA: NEGATIVE
Ketones, UA: NEGATIVE
Nitrite, UA: NEGATIVE
PH UA: 6
Protein, UA: NEGATIVE
SPEC GRAV UA: 1.02
UROBILINOGEN UA: 0.2

## 2014-10-30 LAB — GLUCOSE, POCT (MANUAL RESULT ENTRY): POC Glucose: 108 mg/dl — AB (ref 70–99)

## 2014-10-30 LAB — TSH: TSH: 3.58 u[IU]/mL (ref 0.35–4.50)

## 2014-10-30 LAB — VITAMIN B12: VITAMIN B 12: 327 pg/mL (ref 211–911)

## 2014-10-30 MED ORDER — METRONIDAZOLE 500 MG PO TABS: 500.0000 mg | ORAL_TABLET | Freq: Two times a day (BID) | ORAL | Status: DC

## 2014-10-30 NOTE — Patient Instructions (Signed)
Bacterial Vaginosis Bacterial vaginosis is a vaginal infection that occurs when the normal balance of bacteria in the vagina is disrupted. It results from an overgrowth of certain bacteria. This is the most common vaginal infection in women of childbearing age. Treatment is important to prevent complications, especially in pregnant women, as it can cause a premature delivery. CAUSES  Bacterial vaginosis is caused by an increase in harmful bacteria that are normally present in smaller amounts in the vagina. Several different kinds of bacteria can cause bacterial vaginosis. However, the reason that the condition develops is not fully understood. RISK FACTORS Certain activities or behaviors can put you at an increased risk of developing bacterial vaginosis, including:  Having a new sex partner or multiple sex partners.  Douching.  Using an intrauterine device (IUD) for contraception. Women do not get bacterial vaginosis from toilet seats, bedding, swimming pools, or contact with objects around them. SIGNS AND SYMPTOMS  Some women with bacterial vaginosis have no signs or symptoms. Common symptoms include:  Grey vaginal discharge.  A fishlike odor with discharge, especially after sexual intercourse.  Itching or burning of the vagina and vulva.  Burning or pain with urination. DIAGNOSIS  Your health care provider will take a medical history and examine the vagina for signs of bacterial vaginosis. A sample of vaginal fluid may be taken. Your health care provider will look at this sample under a microscope to check for bacteria and abnormal cells. A vaginal pH test may also be done.  TREATMENT  Bacterial vaginosis may be treated with antibiotic medicines. These may be given in the form of a pill or a vaginal cream. A second round of antibiotics may be prescribed if the condition comes back after treatment.  HOME CARE INSTRUCTIONS   Only take over-the-counter or prescription medicines as  directed by your health care provider.  If antibiotic medicine was prescribed, take it as directed. Make sure you finish it even if you start to feel better.  Do not have sex until treatment is completed.  Tell all sexual partners that you have a vaginal infection. They should see their health care provider and be treated if they have problems, such as a mild rash or itching.  Practice safe sex by using condoms and only having one sex partner. SEEK MEDICAL CARE IF:   Your symptoms are not improving after 3 days of treatment.  You have increased discharge or pain.  You have a fever. MAKE SURE YOU:   Understand these instructions.  Will watch your condition.  Will get help right away if you are not doing well or get worse. FOR MORE INFORMATION  Centers for Disease Control and Prevention, Division of STD Prevention: www.cdc.gov/std American Sexual Health Association (ASHA): www.ashastd.org  Document Released: 06/13/2005 Document Revised: 04/03/2013 Document Reviewed: 01/23/2013 [employer] Patient Information 2015 [employer]. This information is not intended to replace advice given to you by your health care provider. Make sure you discuss any questions you have with your health care provider.  

## 2014-10-30 NOTE — Progress Notes (Signed)
   Subjective:    Patient ID: [name] Sherman, female    DOB: 03-20-1961, 53 y.o.   MRN: [id number]  HPI Patient seen for the following issues  Vaginal discharge over the past several days. She's noticed a thick white discharge. She's had history of bacterial vaginosis in the past and similar symptoms now. She has one sexual partner and has had the same partner for several years. Remote history of chlamydia but no recent STDs. She has noticed some odor in her urine.  No gross hematuria.  She describes some pain in both feet. She has occasional numbness and frequent burning symptoms. No urine frequency or thirst. She had B12 which is low normal of 248 back in 2014. She does not take any B12 replacement. No family history of type 2 diabetes  Past Medical History  Diagnosis Date  . Migraine   . Hypercholesteremia   . Vitamin D deficiency   . GERD (gastroesophageal reflux disease)   . Cervical cancer    Past Surgical History  Procedure Laterality Date  . Laser ablation    . Bunionectomy    . Abdominal hysterectomy  age 87    menorrhagia    reports that she has never smoked. She does not have any smokeless tobacco history on file. She reports that she drinks alcohol. She reports that she does not use illicit drugs. family history includes Alcohol abuse in her mother; Arthritis in her father; Cancer in her maternal grandmother and paternal grandmother; Heart disease in her father; Hyperlipidemia in her father; Hypertension in her father; Kidney disease in her paternal uncle. No Known Allergies    Review of Systems  Constitutional: Negative for fever and chills.  Respiratory: Negative for shortness of breath.   Cardiovascular: Negative for chest pain.  Genitourinary: Positive for vaginal discharge. Negative for urgency, hematuria, flank pain, decreased urine volume, vaginal bleeding, difficulty urinating, genital sores, vaginal pain, menstrual problem and pelvic pain.  Musculoskeletal:  Negative for back pain.  Neurological: Negative for weakness.       Objective:   Physical Exam  Constitutional: She appears well-developed and well-nourished.  Cardiovascular: Normal rate and regular rhythm.  Exam reveals no gallop.   Pulmonary/Chest: Effort normal and breath sounds normal. No respiratory distress. She has no wheezes. She has no rales.  Musculoskeletal: She exhibits no edema.  Neurological: She is alert. She has normal reflexes.  Full-strength lower extremities          Assessment & Plan:  #1 vaginal discharge. History of bacterial vaginosis. Consider empiric treatment with Flagyl 500 mg twice a day for 7 days #2 paresthesias mostly involving the feet. Check B12 and TSH. Check blood sugar-3 hours postprandial

## 2014-10-30 NOTE — Progress Notes (Signed)
Pre visit review using our clinic review tool, if applicable. No additional management support is needed unless otherwise documented below in the visit note. 

## 2014-10-30 NOTE — Addendum Note (Signed)
Addended by: Marcina Millard on: 10/30/2014 02:41 PM   Modules accepted: Orders

## 2014-10-31 ENCOUNTER — Encounter: Payer: Self-pay | Admitting: Family Medicine

## 2014-10-31 LAB — GC/CHLAMYDIA PROBE AMP, URINE
CHLAMYDIA, SWAB/URINE, PCR: NEGATIVE
GC Probe Amp, Urine: NEGATIVE

## 2014-12-31 ENCOUNTER — Telehealth: Payer: Self-pay | Admitting: Family Medicine

## 2014-12-31 NOTE — Telephone Encounter (Signed)
Pt request refill of the following: amphetamine-dextroamphetamine (ADDERALL) 20 MG tablet ° ° °Phamacy: ° °

## 2014-12-31 NOTE — Telephone Encounter (Signed)
Last visit 10/30/14 Last refill 08/12/14 #60 2 refill

## 2014-12-31 NOTE — Telephone Encounter (Signed)
Refill for 3 months. 

## 2015-01-01 MED ORDER — AMPHETAMINE-DEXTROAMPHETAMINE 20 MG PO TABS: ORAL_TABLET | ORAL | Status: DC

## 2015-01-01 MED ORDER — AMPHETAMINE-DEXTROAMPHETAMINE 20 MG PO TABS: 20.0000 mg | ORAL_TABLET | Freq: Two times a day (BID) | ORAL | Status: DC

## 2015-01-01 NOTE — Telephone Encounter (Signed)
Left message on Pt Vm that RX is ready for pickup

## 2015-04-02 ENCOUNTER — Telehealth: Payer: Self-pay | Admitting: Family Medicine

## 2015-04-03 MED ORDER — AMPHETAMINE-DEXTROAMPHETAMINE 20 MG PO TABS: ORAL_TABLET | ORAL | Status: DC

## 2015-04-03 MED ORDER — AMPHETAMINE-DEXTROAMPHETAMINE 20 MG PO TABS: 20.0000 mg | ORAL_TABLET | Freq: Two times a day (BID) | ORAL | Status: DC

## 2015-04-03 NOTE — Telephone Encounter (Signed)
Rx is ready for pick up. Pt is aware  

## 2015-04-03 NOTE — Telephone Encounter (Signed)
Refills OK. 

## 2015-04-03 NOTE — Telephone Encounter (Signed)
Pt is requesting Rx refill for adderall. Pt last visit 10/30/14 and last Rx refills 01/01/15. Please advise

## 2015-04-23 ENCOUNTER — Telehealth: Payer: Self-pay | Admitting: Family Medicine

## 2015-04-23 ENCOUNTER — Other Ambulatory Visit: Payer: Self-pay | Admitting: Family Medicine

## 2015-04-23 NOTE — Telephone Encounter (Signed)
Pt request refill of the following: Vitamin D, Ergocalciferol, (DRISDOL) 50000 UNITS CAPS capsule   Phamacy: [facility]

## 2015-04-23 NOTE — Telephone Encounter (Signed)
Rx was sent to the Pharmacy

## 2015-05-01 ENCOUNTER — Observation Stay (HOSPITAL_COMMUNITY): Payer: 59 | Admitting: Anesthesiology

## 2015-05-01 ENCOUNTER — Observation Stay (HOSPITAL_COMMUNITY): Payer: Self-pay | Admitting: Anesthesiology

## 2015-05-01 ENCOUNTER — Encounter (HOSPITAL_COMMUNITY)
Admission: EM | Disposition: A | Discharge status: Home / Self Care | Payer: Self-pay | Source: Home / Self Care | Attending: Family Medicine

## 2015-05-01 ENCOUNTER — Encounter (HOSPITAL_COMMUNITY): Payer: Self-pay | Admitting: Emergency Medicine

## 2015-05-01 ENCOUNTER — Inpatient Hospital Stay (HOSPITAL_COMMUNITY)
Admission: EM | Admit: 2015-05-01 | Discharge: 2015-05-03 | DRG: 693 | Disposition: A | Discharge status: Home / Self Care | Payer: Self-pay | Attending: Family Medicine | Admitting: Family Medicine

## 2015-05-01 ENCOUNTER — Emergency Department (HOSPITAL_COMMUNITY): Payer: Self-pay

## 2015-05-01 DIAGNOSIS — Z79899 Other long term (current) drug therapy: Secondary | ICD-10-CM

## 2015-05-01 DIAGNOSIS — Z8249 Family history of ischemic heart disease and other diseases of the circulatory system: Secondary | ICD-10-CM

## 2015-05-01 DIAGNOSIS — Z791 Long term (current) use of non-steroidal anti-inflammatories (NSAID): Secondary | ICD-10-CM

## 2015-05-01 DIAGNOSIS — Z803 Family history of malignant neoplasm of breast: Secondary | ICD-10-CM

## 2015-05-01 DIAGNOSIS — E559 Vitamin D deficiency, unspecified: Secondary | ICD-10-CM | POA: Diagnosis present

## 2015-05-01 DIAGNOSIS — N201 Calculus of ureter: Secondary | ICD-10-CM

## 2015-05-01 DIAGNOSIS — A419 Sepsis, unspecified organism: Secondary | ICD-10-CM | POA: Diagnosis present

## 2015-05-01 DIAGNOSIS — K219 Gastro-esophageal reflux disease without esophagitis: Secondary | ICD-10-CM | POA: Diagnosis present

## 2015-05-01 DIAGNOSIS — Z800 Family history of malignant neoplasm of digestive organs: Secondary | ICD-10-CM

## 2015-05-01 DIAGNOSIS — K429 Umbilical hernia without obstruction or gangrene: Secondary | ICD-10-CM

## 2015-05-01 DIAGNOSIS — N390 Urinary tract infection, site not specified: Secondary | ICD-10-CM | POA: Diagnosis present

## 2015-05-01 DIAGNOSIS — E7800 Pure hypercholesterolemia, unspecified: Secondary | ICD-10-CM | POA: Diagnosis present

## 2015-05-01 DIAGNOSIS — N132 Hydronephrosis with renal and ureteral calculous obstruction: Principal | ICD-10-CM | POA: Diagnosis present

## 2015-05-01 DIAGNOSIS — E785 Hyperlipidemia, unspecified: Secondary | ICD-10-CM | POA: Diagnosis present

## 2015-05-01 DIAGNOSIS — I959 Hypotension, unspecified: Secondary | ICD-10-CM | POA: Diagnosis present

## 2015-05-01 DIAGNOSIS — Z8541 Personal history of malignant neoplasm of cervix uteri: Secondary | ICD-10-CM

## 2015-05-01 HISTORY — PX: CYSTOSCOPY WITH RETROGRADE PYELOGRAM, URETEROSCOPY AND STENT PLACEMENT: SHX5789

## 2015-05-01 LAB — DIFFERENTIAL
BASOS ABS: 0.1 10*3/uL (ref 0.0–0.1)
Basophils Relative: 0 %
EOS ABS: 0.1 10*3/uL (ref 0.0–0.7)
Eosinophils Relative: 1 %
LYMPHS ABS: 1.5 10*3/uL (ref 0.7–4.0)
Lymphocytes Relative: 11 %
MONOS PCT: 8 %
Monocytes Absolute: 1.1 10*3/uL — ABNORMAL HIGH (ref 0.1–1.0)
Neutro Abs: 11 10*3/uL — ABNORMAL HIGH (ref 1.7–7.7)
Neutrophils Relative %: 80 %

## 2015-05-01 LAB — URINALYSIS, ROUTINE W REFLEX MICROSCOPIC
BILIRUBIN URINE: NEGATIVE
BILIRUBIN URINE: NEGATIVE
GLUCOSE, UA: NEGATIVE mg/dL
Glucose, UA: NEGATIVE mg/dL
KETONES UR: NEGATIVE mg/dL
Ketones, ur: NEGATIVE mg/dL
NITRITE: NEGATIVE
Nitrite: POSITIVE — AB
PH: 7 (ref 5.0–8.0)
Protein, ur: NEGATIVE mg/dL
Protein, ur: NEGATIVE mg/dL
SPECIFIC GRAVITY, URINE: 1.006 (ref 1.005–1.030)
SPECIFIC GRAVITY, URINE: 1.037 — AB (ref 1.005–1.030)
Urobilinogen, UA: 0.2 mg/dL (ref 0.0–1.0)
Urobilinogen, UA: 0.2 mg/dL (ref 0.0–1.0)
pH: 6.5 (ref 5.0–8.0)

## 2015-05-01 LAB — URINE MICROSCOPIC-ADD ON

## 2015-05-01 LAB — CBC
HEMATOCRIT: 37.4 % (ref 36.0–46.0)
Hemoglobin: 12.3 g/dL (ref 12.0–15.0)
MCH: 27.8 pg (ref 26.0–34.0)
MCHC: 32.9 g/dL (ref 30.0–36.0)
MCV: 84.6 fL (ref 78.0–100.0)
Platelets: 260 10*3/uL (ref 150–400)
RBC: 4.42 MIL/uL (ref 3.87–5.11)
RDW: 13.1 % (ref 11.5–15.5)
WBC: 14.4 10*3/uL — ABNORMAL HIGH (ref 4.0–10.5)

## 2015-05-01 LAB — COMPREHENSIVE METABOLIC PANEL
ALT: 15 U/L (ref 14–54)
AST: 16 U/L (ref 15–41)
Albumin: 3.6 g/dL (ref 3.5–5.0)
Alkaline Phosphatase: 77 U/L (ref 38–126)
Anion gap: 6 (ref 5–15)
BUN: 14 mg/dL (ref 6–20)
CHLORIDE: 104 mmol/L (ref 101–111)
CO2: 27 mmol/L (ref 22–32)
Calcium: 8.6 mg/dL — ABNORMAL LOW (ref 8.9–10.3)
Creatinine, Ser: 1.05 mg/dL — ABNORMAL HIGH (ref 0.44–1.00)
GFR calc Af Amer: >60 mL/min (ref >60)
GFR calc non Af Amer: 60 mL/min — ABNORMAL LOW (ref >60)
GLUCOSE: 118 mg/dL — AB (ref 65–99)
POTASSIUM: 4 mmol/L (ref 3.5–5.1)
Sodium: 137 mmol/L (ref 135–145)
Total Bilirubin: 0.6 mg/dL (ref 0.3–1.2)
Total Protein: 6.9 g/dL (ref 6.5–8.1)

## 2015-05-01 LAB — SURGICAL PCR SCREEN
MRSA, PCR: NEGATIVE
STAPHYLOCOCCUS AUREUS: NEGATIVE

## 2015-05-01 LAB — LIPASE, BLOOD: Lipase: 39 U/L (ref 11–51)

## 2015-05-01 SURGERY — CYSTOURETEROSCOPY, WITH RETROGRADE PYELOGRAM AND STENT INSERTION
Anesthesia: General | Site: Ureter | Laterality: Left

## 2015-05-01 MED ORDER — PROMETHAZINE HCL 25 MG/ML IJ SOLN
6.2500 mg | INTRAMUSCULAR | Status: AC | PRN
  Administered 2015-05-02 – 2015-05-03 (×2): 12.5 mg via INTRAVENOUS
  Filled 2015-05-01 (×3): qty 1

## 2015-05-01 MED ORDER — PROPOFOL 10 MG/ML IV BOLUS
INTRAVENOUS | Status: DC | PRN
  Administered 2015-05-01: 200 mg via INTRAVENOUS

## 2015-05-01 MED ORDER — LACTATED RINGERS IV SOLN
INTRAVENOUS | Status: DC
  Administered 2015-05-01: 1000 mL via INTRAVENOUS

## 2015-05-01 MED ORDER — AMPHETAMINE-DEXTROAMPHETAMINE 20 MG PO TABS
20.0000 mg | ORAL_TABLET | Freq: Two times a day (BID) | ORAL | Status: DC
  Filled 2015-05-01 (×2): qty 1

## 2015-05-01 MED ORDER — CIPROFLOXACIN IN D5W 400 MG/200ML IV SOLN
INTRAVENOUS | Status: AC
  Filled 2015-05-01: qty 200

## 2015-05-01 MED ORDER — ONDANSETRON HCL 4 MG/2ML IJ SOLN
4.0000 mg | Freq: Once | INTRAMUSCULAR | Status: AC
  Administered 2015-05-01: 4 mg via INTRAVENOUS
  Filled 2015-05-01: qty 2

## 2015-05-01 MED ORDER — PROPOFOL 10 MG/ML IV BOLUS
INTRAVENOUS | Status: AC
  Filled 2015-05-01: qty 20

## 2015-05-01 MED ORDER — ENOXAPARIN SODIUM 40 MG/0.4ML ~~LOC~~ SOLN
40.0000 mg | Freq: Every day | SUBCUTANEOUS | Status: DC
  Administered 2015-05-01 – 2015-05-02 (×2): 40 mg via SUBCUTANEOUS
  Filled 2015-05-01 (×3): qty 0.4

## 2015-05-01 MED ORDER — PROMETHAZINE HCL 25 MG/ML IJ SOLN: 25.0000 mg | Freq: Once | INTRAMUSCULAR | Status: DC

## 2015-05-01 MED ORDER — DEXAMETHASONE SODIUM PHOSPHATE 10 MG/ML IJ SOLN
INTRAMUSCULAR | Status: DC | PRN
  Administered 2015-05-01: 10 mg via INTRAVENOUS

## 2015-05-01 MED ORDER — IOHEXOL 300 MG/ML  SOLN
25.0000 mL | Freq: Once | INTRAMUSCULAR | Status: AC | PRN
  Administered 2015-05-01: 25 mL via ORAL

## 2015-05-01 MED ORDER — MORPHINE SULFATE (PF) 4 MG/ML IV SOLN
4.0000 mg | Freq: Once | INTRAVENOUS | Status: AC
  Administered 2015-05-01: 4 mg via INTRAVENOUS
  Filled 2015-05-01: qty 1

## 2015-05-01 MED ORDER — ONDANSETRON HCL 4 MG/2ML IJ SOLN
4.0000 mg | Freq: Four times a day (QID) | INTRAMUSCULAR | Status: DC | PRN
  Administered 2015-05-01 – 2015-05-03 (×2): 4 mg via INTRAVENOUS
  Filled 2015-05-01 (×2): qty 2

## 2015-05-01 MED ORDER — ATORVASTATIN CALCIUM 10 MG PO TABS
10.0000 mg | ORAL_TABLET | Freq: Every day | ORAL | Status: DC
  Administered 2015-05-01 – 2015-05-03 (×3): 10 mg via ORAL
  Filled 2015-05-01 (×3): qty 1

## 2015-05-01 MED ORDER — CIPROFLOXACIN IN D5W 400 MG/200ML IV SOLN
400.0000 mg | Freq: Once | INTRAVENOUS | Status: AC
  Administered 2015-05-01: 400 mg via INTRAVENOUS

## 2015-05-01 MED ORDER — LIDOCAINE HCL (CARDIAC) 20 MG/ML IV SOLN
INTRAVENOUS | Status: DC | PRN
  Administered 2015-05-01: 50 mg via INTRAVENOUS

## 2015-05-01 MED ORDER — ACETAMINOPHEN 650 MG RE SUPP
650.0000 mg | Freq: Four times a day (QID) | RECTAL | Status: DC | PRN
Start: 2015-05-01 — End: 2015-05-03

## 2015-05-01 MED ORDER — DEXTROSE 5 % IV SOLN
1.0000 g | Freq: Once | INTRAVENOUS | Status: AC
  Administered 2015-05-01: 1 g via INTRAVENOUS
  Filled 2015-05-01: qty 10

## 2015-05-01 MED ORDER — SODIUM CHLORIDE 0.9 % IV SOLN
INTRAVENOUS | Status: DC
Start: 2015-05-01 — End: 2015-05-03
  Administered 2015-05-01 – 2015-05-02 (×4): via INTRAVENOUS

## 2015-05-01 MED ORDER — MIDAZOLAM HCL 2 MG/2ML IJ SOLN
INTRAMUSCULAR | Status: AC
  Filled 2015-05-01: qty 4

## 2015-05-01 MED ORDER — ACETAMINOPHEN 325 MG PO TABS: 650.0000 mg | ORAL_TABLET | Freq: Four times a day (QID) | ORAL | Status: DC | PRN

## 2015-05-01 MED ORDER — DEXTROSE 5 % IV SOLN
1.0000 g | INTRAVENOUS | Status: DC
  Administered 2015-05-02 – 2015-05-03 (×2): 1 g via INTRAVENOUS
  Filled 2015-05-01 (×2): qty 10

## 2015-05-01 MED ORDER — FENTANYL CITRATE (PF) 100 MCG/2ML IJ SOLN
INTRAMUSCULAR | Status: AC
Start: 2015-05-01 — End: 2015-05-01
  Filled 2015-05-01: qty 4

## 2015-05-01 MED ORDER — IOHEXOL 300 MG/ML  SOLN
100.0000 mL | Freq: Once | INTRAMUSCULAR | Status: AC | PRN
  Administered 2015-05-01: 100 mL via INTRAVENOUS

## 2015-05-01 MED ORDER — ONDANSETRON HCL 4 MG/2ML IJ SOLN
4.0000 mg | Freq: Once | INTRAMUSCULAR | Status: AC | PRN
  Administered 2015-05-01: 4 mg via INTRAVENOUS
  Filled 2015-05-01: qty 2

## 2015-05-01 MED ORDER — ONDANSETRON HCL 4 MG PO TABS: 4.0000 mg | ORAL_TABLET | Freq: Four times a day (QID) | ORAL | Status: DC | PRN

## 2015-05-01 MED ORDER — IOHEXOL 350 MG/ML SOLN
INTRAVENOUS | Status: DC | PRN
  Administered 2015-05-01: 8 mL via INTRAVENOUS

## 2015-05-01 MED ORDER — DEXTROSE 5 % IV SOLN: 1.0000 g | Freq: Once | INTRAVENOUS | Status: DC

## 2015-05-01 MED ORDER — METOCLOPRAMIDE HCL 5 MG/ML IJ SOLN
10.0000 mg | INTRAMUSCULAR | Status: AC
  Administered 2015-05-01: 10 mg via INTRAVENOUS
  Filled 2015-05-01: qty 2

## 2015-05-01 MED ORDER — MORPHINE SULFATE (PF) 2 MG/ML IV SOLN
2.0000 mg | INTRAVENOUS | Status: DC | PRN
  Administered 2015-05-01 – 2015-05-02 (×3): 2 mg via INTRAVENOUS
  Filled 2015-05-01 (×3): qty 1

## 2015-05-01 MED ORDER — ACETAMINOPHEN 325 MG PO TABS
650.0000 mg | ORAL_TABLET | Freq: Once | ORAL | Status: AC
Start: 2015-05-01 — End: 2015-05-01
  Administered 2015-05-01: 650 mg via ORAL
  Filled 2015-05-01: qty 2

## 2015-05-01 MED ORDER — HYDROMORPHONE HCL 1 MG/ML IJ SOLN: 0.2500 mg | INTRAMUSCULAR | Status: DC | PRN

## 2015-05-01 MED ORDER — MIDAZOLAM HCL 5 MG/5ML IJ SOLN
INTRAMUSCULAR | Status: DC | PRN
  Administered 2015-05-01: 2 mg via INTRAVENOUS

## 2015-05-01 MED ORDER — FENTANYL CITRATE (PF) 100 MCG/2ML IJ SOLN
INTRAMUSCULAR | Status: DC | PRN
Start: 2015-05-01 — End: 2015-05-01
  Administered 2015-05-01 (×4): 25 ug via INTRAVENOUS

## 2015-05-01 SURGICAL SUPPLY — 17 items
BAG URINE DRAINAGE (UROLOGICAL SUPPLIES) ×1 IMPLANT
BAG URO CATCHER STRL LF (DRAPE) ×2 IMPLANT
BASKET ZERO TIP NITINOL 2.4FR (BASKET) IMPLANT
BSKT STON RTRVL ZERO TP 2.4FR (BASKET)
CATH FOLEY 2WAY SLVR  5CC 16FR (CATHETERS) ×1
CATH FOLEY 2WAY SLVR 5CC 16FR (CATHETERS) IMPLANT
CATH INTERMIT  6FR 70CM (CATHETERS) ×1 IMPLANT
CLOTH BEACON ORANGE TIMEOUT ST (SAFETY) ×2 IMPLANT
GLOVE BIOGEL M STRL SZ7.5 (GLOVE) ×2 IMPLANT
GOWN STRL REUS W/TWL XL LVL3 (GOWN DISPOSABLE) ×2 IMPLANT
GUIDEWIRE ANG ZIPWIRE 038X150 (WIRE) IMPLANT
GUIDEWIRE STR DUAL SENSOR (WIRE) ×1 IMPLANT
HOLDER FOLEY CATH W/STRAP (MISCELLANEOUS) ×1 IMPLANT
IV NS IRRIG 3000ML ARTHROMATIC (IV SOLUTION) ×1 IMPLANT
PACK CYSTO (CUSTOM PROCEDURE TRAY) ×3 IMPLANT
STENT PERCUFLEX 4.8FRX26 (STENTS) ×1 IMPLANT
SYRINGE IRR TOOMEY STRL 70CC (SYRINGE) IMPLANT

## 2015-05-01 NOTE — H&P (Signed)
PCP:   [name], MD   Chief Complaint:  Left flank pain  HPI:  54 year old female who  has a past medical history of Migraine; Hypercholesteremia; Vitamin D deficiency; GERD (gastroesophageal reflux disease); Cervical cancer ([place]); and Hernia, ventral. Today presents to the hospital with left-sided flank pain for past 2 days. Patient also had nausea but no vomiting or diarrhea. Had mild dysuria but denies hematuria. The pain was constant 9/10 intensity. Did not radiate anywhere. In the ED CT with contrast showed a 5 mm left UPJ stone with moderate hydronephrosis. Urology consulted by the ED physician. Patient denies chest pain, no shortness of breath or headache no blurred vision.  Allergies:  No Known Allergies    Past Medical History  Diagnosis Date  . Migraine   . Hypercholesteremia   . Vitamin D deficiency   . GERD (gastroesophageal reflux disease)   . Cervical cancer ([place])   . Hernia, ventral     Past Surgical History  Procedure Laterality Date  . Laser ablation    . Bunionectomy    . Abdominal hysterectomy  age 60    menorrhagia    Prior to Admission medications   Medication Sig Start Date End Date Taking? Authorizing Provider  amphetamine-dextroamphetamine (ADDERALL) 20 MG tablet Take 1 tablet (20 mg total) by mouth 2 (two) times daily. 04/03/15  Yes [name], MD  atorvastatin (LIPITOR) 10 MG tablet Take 1 tablet (10 mg total) by mouth daily. 03/14/14  Yes [name], MD  ibuprofen (ADVIL,MOTRIN) 200 MG tablet Take 600 mg by mouth every 6 (six) hours as needed for moderate pain.   Yes Historical Provider, MD  Multiple Vitamin (MULTIVITAMIN WITH MINERALS) TABS tablet Take 1 tablet by mouth daily.   Yes Historical Provider, MD  citalopram (CELEXA) 20 MG tablet Take 1 tablet (20 mg total) by mouth daily. Taking 3 tabs at [facility] Patient not taking: Reported on 05/01/2015 03/14/14   [name], MD  metroNIDAZOLE (FLAGYL) 500 MG tablet Take 1  tablet (500 mg total) by mouth 2 (two) times daily. Patient not taking: Reported on 05/01/2015 10/30/14   [name], MD  Vitamin D, Ergocalciferol, (DRISDOL) 50000 UNITS CAPS capsule TAKE ONE CAPSULE BY MOUTH ONCE A WEEK 04/23/15   [name], MD    Social History:  reports that she has never smoked. She does not have any smokeless tobacco history on file. She reports that she does not drink alcohol or use illicit drugs.  Family History  Problem Relation Age of Onset  . Alcohol abuse Mother   . Arthritis Father   . Hyperlipidemia Father   . Heart disease Father   . Hypertension Father   . Kidney disease Paternal Uncle   . Cancer Maternal Grandmother     colon  . Cancer Paternal Grandmother     breast     All the positives are listed in BOLD  Review of Systems:  HEENT: Headache, blurred vision, runny nose, sore throat Neck: Hypothyroidism, hyperthyroidism,,lymphadenopathy Chest : Shortness of breath, history of COPD, Asthma Heart : Chest pain, history of coronary arterey disease GI:  Nausea, vomiting, diarrhea, constipation, GERD GU: Dysuria, urgency, frequency of urination, hematuria Neuro: Stroke, seizures, syncope Psych: Depression, anxiety, hallucinations   Physical Exam: Blood pressure 105/46, pulse 90, temperature 98.6 F (37 C), temperature source Oral, resp. rate 16, SpO2 95 %. Constitutional:   Patient is a well-developed and well-nourished female in no acute distress and cooperative with exam.  Head: Normocephalic and atraumatic Mouth: Mucus membranes moist Eyes: PERRL, EOMI, conjunctivae normal Neck: Supple, No Thyromegaly Cardiovascular: RRR, S1 normal, S2 normal Pulmonary/Chest: CTAB, no wheezes, rales, or rhonchi Abdominal: Soft. Left CVA tenderness,  non-distended, bowel sounds are normal, no masses, organomegaly, or guarding present.  Neurological: A&O x3, Strength is normal and symmetric bilaterally, cranial nerve II-XII are grossly intact, no  focal motor deficit, sensory intact to light touch bilaterally.  Extremities : No Cyanosis, Clubbing or Edema  Labs on Admission:  Basic Metabolic Panel:  Recent Labs Lab 05/01/15 0300  NA 137  K 4.0  CL 104  CO2 27  GLUCOSE 118*  BUN 14  CREATININE 1.05*  CALCIUM 8.6*   Liver Function Tests:  Recent Labs Lab 05/01/15 0300  AST 16  ALT 15  ALKPHOS 77  BILITOT 0.6  PROT 6.9  ALBUMIN 3.6    Recent Labs Lab 05/01/15 0300  LIPASE 39   No results for input(s): AMMONIA in the last 168 hours. CBC:  Recent Labs Lab 05/01/15 0300  WBC 14.4*  NEUTROABS 11.0*  HGB 12.3  HCT 37.4  MCV 84.6  PLT 260    Radiological Exams on Admission: Ct Abdomen Pelvis W Contrast  05/01/2015  CLINICAL DATA:  Upper abdominal pain for 2 days.  Nausea. EXAM: CT ABDOMEN AND PELVIS WITH CONTRAST TECHNIQUE: Multidetector CT imaging of the abdomen and pelvis was performed using the standard protocol following bolus administration of intravenous contrast. CONTRAST:  143mL OMNIPAQUE IOHEXOL 300 MG/ML  SOLN COMPARISON:  12/10/2012 FINDINGS: There is a 5 mm calculus at the left ureteropelvic junction with moderate hydronephrosis and perinephric stranding. Additional collecting system calculi are present bilaterally measuring up to 6 x 8 mm. No other acute findings are evident in the abdomen or pelvis. There are normal appearances of the liver, spleen, pancreas and adrenals. There are normal appearances of the stomach, small bowel and colon. The appendix is normal. Insert basket there is hysterectomy. No adnexal abnormalities are evident. No significant skeletal lesions are evident. There is moderate degenerative disc disease, greatest at L5-S1. There is a mid midline supraumbilical ventral hernia containing omental fat. There is a fat-containing umbilical hernia. There is no significant abnormality in the lower chest. IMPRESSION: 5 mm left UPJ calculus with moderate hydronephrosis. Bilateral  nephrolithiasis Ventral hernia containing omentum, supraumbilical Fat containing umbilical hernia Electronically Signed   By: Andreas Newport M.D.   On: 05/01/2015 05:39     Assessment/Plan Active Problems:   UTI (lower urinary tract infection)   Ureteral stone with hydronephrosis  Left UPJ calculus with moderate hydronephrosis Urology has been consulted by the ED physician. Plan is for ureteral stent. Morphine when necessary for pain  UTI Patient's UA shows positive nitrite, 21-50 WBC. Has been started on Rocephin. Will follow urine culture results, continue Rocephin per pharmacy consultation.    DVT prophylaxis Lovenox  Code status: Full code  Family discussion: No family present at bedside   Time Spent on Admission: 60 min  [facility] Pager: [phone] 05/01/2015, 11:08 AM  If 7PM-7AM, please contact night-coverage  [email or web address]  Password [email or web address]

## 2015-05-01 NOTE — ED Notes (Signed)
Hospitalist MD at bedside. 

## 2015-05-01 NOTE — Discharge Instructions (Signed)
Ureteral Stent Implantation, Care After Refer to this sheet in the next few weeks. These instructions provide you with information on caring for yourself after your procedure. Your health care provider may also give you more specific instructions. Your treatment has been planned according to current medical practices, but problems sometimes occur. Call your health care provider if you have any problems or questions after your procedure. WHAT TO EXPECT AFTER THE PROCEDURE You should be back to normal activity within 48 hours after the procedure. Nausea and vomiting may occur and are commonly the result of anesthesia. It is common to experience sharp pain in the back or lower abdomen and penis with voiding. This is caused by movement of the ends of the stent with the act of urinating.It usually goes away within minutes after you have stopped urinating. HOME CARE INSTRUCTIONS Make sure to drink plenty of fluids. You may have small amounts of bleeding, causing your urine to be red. This is normal. Certain movements may trigger pain or a feeling that you need to urinate. You may be given medicines to prevent infection or bladder spasms. Be sure to take all medicines as directed. Only take over-the-counter or prescription medicines for pain, discomfort, or fever as directed by your health care provider. Do not take aspirin, as this can make bleeding worse.  Your stent will be left in until the stones are removed. This will be done in a few weeks. Be sure to keep all follow-up appointments so your health care provider can check that you are healing properly.  SEEK MEDICAL CARE IF:  You experience increasing pain.  Your pain medicine is not working. SEEK IMMEDIATE MEDICAL CARE IF:  Your urine is dark red or has blood clots.  You are leaking urine (incontinent).  You have a fever, chills, feeling sick to your stomach (nausea), or vomiting.  Your pain is not relieved by pain medicine.  The end of the  stent comes out of the urethra.  You are unable to urinate.   This information is not intended to replace advice given to you by your health care provider. Make sure you discuss any questions you have with your health care provider.   Document Released: 02/13/2013 Document Revised: 06/18/2013 Document Reviewed: 12/26/2014 Elsevier Interactive Patient Education [employer].

## 2015-05-01 NOTE — Consult Note (Signed)
Urology Consult  Referring physician: Charlann Lange, PA Reason for referral: Left ureteral stone and UTI  Chief Complaint: Left flank pain   History of Present Illness:  54 year old female who presented to ER with left-sided flank pain for past 2 days. Patient also had nausea but no vomiting or diarrhea. Had mild dysuria but denies hematuria, fevers, chills, night sweats, frequency, urgency and suprapubic pain. The left flank pain was constant 9/10 intensity. Did not radiate anywhere. In the ED CT with contrast showed a 5 mm left UPJ stone with moderate hydronephrosis. Voided and catheterized UA was consistent with UTI in ER. WBC was 14.4 with no left shift, creatinine 1.0. She was initially afebrile with stable vital signs, however, while in the ER became febrile to 101.7 and mildly tachycardic to 102 with mild hypotension to 95/46.  She was admitted to the Hospitalist service for management of sepsis of urinary origin and urology was consulted for left ureteral stent placement.    Past Medical History  Diagnosis Date  . Migraine   . Hypercholesteremia   . Vitamin D deficiency   . GERD (gastroesophageal reflux disease)   . Cervical cancer ([place])   . Hernia, ventral    Past Surgical History  Procedure Laterality Date  . Laser ablation    . Bunionectomy    . Abdominal hysterectomy  age 39    menorrhagia    Medications: I have reviewed the patient's current medications. Allergies: No Known Allergies  Family History  Problem Relation Age of Onset  . Alcohol abuse Mother   . Arthritis Father   . Hyperlipidemia Father   . Heart disease Father   . Hypertension Father   . Kidney disease Paternal Uncle   . Cancer Maternal Grandmother     colon  . Cancer Paternal Grandmother     breast   Social History:  reports that she has never smoked. She does not have any smokeless tobacco history on file. She reports that she does not drink alcohol or use illicit drugs.  ROS 12 system review  was performed and was negative except for the pertinent positives listed in the HPI.  Physical Exam:  Vital signs in last 24 hours: Temp:  [97.9 F (36.6 C)-101.7 F (38.7 C)] 98.8 F (37.1 C) (11/04 1110) Pulse Rate:  [87-102] 90 (11/04 1110) Resp:  [15-20] 16 (11/04 1110) BP: (95-134)/(46-63) 102/50 mmHg (11/04 1110) SpO2:  [93 %-99 %] 95 % (11/04 1110) Physical Exam  Constitutional: Patient is a well-developed and well-nourished female in no acute distress and cooperative with exam. Head: Normocephalic and atraumatic Eyes: conjunctivae normal Neck: Supple, No Thyromegaly Cardiovascular: RRR, S1 normal, S2 normal Pulmonary/Chest: No increased work of breathing on room air.  Abdominal: Soft. Left CVA tenderness, non-distended, bowel sounds are normal, no masses, organomegaly, or guarding present.  Neurological: A&O x3, Strength is normal and symmetric bilaterally, cranial nerve II-XII are grossly intact, no focal motor deficit, sensory intact to light touch bilaterally.  Extremities : No Cyanosis, Clubbing or Edema  Laboratory Data:  Results for orders placed or performed during the hospital encounter of 05/01/15 (from the past 72 hour(s))  Lipase, blood     Status: None   Collection Time: 05/01/15  3:00 AM  Result Value Ref Range   Lipase 39 11 - 51 U/L  Comprehensive metabolic panel     Status: Abnormal   Collection Time: 05/01/15  3:00 AM  Result Value Ref Range   Sodium 137 135 - 145 mmol/L  Potassium 4.0 3.5 - 5.1 mmol/L   Chloride 104 101 - 111 mmol/L   CO2 27 22 - 32 mmol/L   Glucose, Bld 118 (H) 65 - 99 mg/dL   BUN 14 6 - 20 mg/dL   Creatinine, Ser 1.05 (H) 0.44 - 1.00 mg/dL   Calcium 8.6 (L) 8.9 - 10.3 mg/dL   Total Protein 6.9 6.5 - 8.1 g/dL   Albumin 3.6 3.5 - 5.0 g/dL   AST 16 15 - 41 U/L   ALT 15 14 - 54 U/L   Alkaline Phosphatase 77 38 - 126 U/L   Total Bilirubin 0.6 0.3 - 1.2 mg/dL   GFR calc non Af Amer 60 (L) >60 mL/min   GFR calc Af Amer >60 >60  mL/min    Comment: (NOTE) The eGFR has been calculated using the CKD EPI equation. This calculation has not been validated in all clinical situations. eGFR's persistently <60 mL/min signify possible Chronic Kidney Disease.    Anion gap 6 5 - 15  CBC     Status: Abnormal   Collection Time: 05/01/15  3:00 AM  Result Value Ref Range   WBC 14.4 (H) 4.0 - 10.5 K/uL   RBC 4.42 3.87 - 5.11 MIL/uL   Hemoglobin 12.3 12.0 - 15.0 g/dL   HCT 37.4 36.0 - 46.0 %   MCV 84.6 78.0 - 100.0 fL   MCH 27.8 26.0 - 34.0 pg   MCHC 32.9 30.0 - 36.0 g/dL   RDW 13.1 11.5 - 15.5 %   Platelets 260 150 - 400 K/uL  Differential     Status: Abnormal   Collection Time: 05/01/15  3:00 AM  Result Value Ref Range   Neutrophils Relative % 80 %   Neutro Abs 11.0 (H) 1.7 - 7.7 K/uL   Lymphocytes Relative 11 %   Lymphs Abs 1.5 0.7 - 4.0 K/uL   Monocytes Relative 8 %   Monocytes Absolute 1.1 (H) 0.1 - 1.0 K/uL   Eosinophils Relative 1 %   Eosinophils Absolute 0.1 0.0 - 0.7 K/uL   Basophils Relative 0 %   Basophils Absolute 0.1 0.0 - 0.1 K/uL  Urinalysis, Routine w reflex microscopic (not at [facility])     Status: Abnormal   Collection Time: 05/01/15  4:49 AM  Result Value Ref Range   Color, Urine YELLOW YELLOW   APPearance CLOUDY (A) CLEAR   Specific Gravity, Urine 1.006 1.005 - 1.030   pH 6.5 5.0 - 8.0   Glucose, UA NEGATIVE NEGATIVE mg/dL   Hgb urine dipstick SMALL (A) NEGATIVE   Bilirubin Urine NEGATIVE NEGATIVE   Ketones, ur NEGATIVE NEGATIVE mg/dL   Protein, ur NEGATIVE NEGATIVE mg/dL   Urobilinogen, UA 0.2 0.0 - 1.0 mg/dL   Nitrite NEGATIVE NEGATIVE   Leukocytes, UA LARGE (A) NEGATIVE  Urine microscopic-add on     Status: Abnormal   Collection Time: 05/01/15  4:49 AM  Result Value Ref Range   WBC, UA 21-50 <3 WBC/hpf    Comment: 21-50 21-50    Bacteria, UA MANY (A) RARE    Comment: MANY MANY   Urinalysis, Routine w reflex microscopic (not at [facility])     Status: Abnormal   Collection Time: 05/01/15   8:21 AM  Result Value Ref Range   Color, Urine YELLOW YELLOW   APPearance CLOUDY (A) CLEAR   Specific Gravity, Urine 1.037 (H) 1.005 - 1.030   pH 7.0 5.0 - 8.0   Glucose, UA NEGATIVE NEGATIVE mg/dL   Hgb urine dipstick  TRACE (A) NEGATIVE   Bilirubin Urine NEGATIVE NEGATIVE   Ketones, ur NEGATIVE NEGATIVE mg/dL   Protein, ur NEGATIVE NEGATIVE mg/dL   Urobilinogen, UA 0.2 0.0 - 1.0 mg/dL   Nitrite POSITIVE (A) NEGATIVE   Leukocytes, UA MODERATE (A) NEGATIVE  Urine microscopic-add on     Status: None   Collection Time: 05/01/15  8:21 AM  Result Value Ref Range   WBC, UA 21-50 <3 WBC/hpf   RBC / HPF 0-2 <3 RBC/hpf   Bacteria, UA RARE RARE   No results found for this or any previous visit (from the past 240 hour(s)). Creatinine:  Recent Labs  05/01/15 0300  CREATININE 1.05*    Impression/Assessment:  55 yo F with left 5 mm UPJ stone, hydronephrosis, evidence of UTI on UA, fever, mild tachycardia, mild hypotension consistent with sepsis of urinary origin.  Plan:  1. Post patient for emergent cystoscopy and left ureteral stent placement  2. Foley catheter for complete decompression of collecting system in with sepsis of urinary origin 3. Recommend broad spectrum antibiotics with vancomycin and ceftazidime 4. Follow up urine culture 5. Send blood cultures 6. Patient will likely require supportive care for her sepsis following stenting.   Plan was discussed with Dr. Junious Silk who is in agreement with this plan.  Acie Fredrickson 05/01/2015, 11:30 AM

## 2015-05-01 NOTE — ED Notes (Addendum)
Patient c/o upper abd pain x2 days, states the pain causes her to be nauseated. Patient reports abd distention. Patient also has a hernia. Patient took 800mg  ibuprofen at 0000.

## 2015-05-01 NOTE — Anesthesia Preprocedure Evaluation (Signed)
Anesthesia Evaluation  Patient identified by MRN, date of birth, ID band  Reviewed: Allergy & Precautions, NPO status , Patient's Chart, lab work & pertinent test results  Airway Mallampati: II  TM Distance: >3 FB Neck ROM: Full    Dental   Pulmonary Recent URI ,    breath sounds clear to auscultation       Cardiovascular negative cardio ROS   Rhythm:Regular Rate:Normal     Neuro/Psych    GI/Hepatic Neg liver ROS, GERD  ,  Endo/Other    Renal/GU Renal disease     Musculoskeletal   Abdominal   Peds  Hematology   Anesthesia Other Findings   Reproductive/Obstetrics                             Anesthesia Physical Anesthesia Plan  ASA: II  Anesthesia Plan: General   Post-op Pain Management:    Induction: Intravenous  Airway Management Planned: LMA  Additional Equipment:   Intra-op Plan:   Post-operative Plan: Extubation in OR  Informed Consent: I have reviewed the patients History and Physical, chart, labs and discussed the procedure including the risks, benefits and alternatives for the proposed anesthesia with the patient or authorized representative who has indicated his/her understanding and acceptance.   Dental advisory given  Plan Discussed with: CRNA and Anesthesiologist  Anesthesia Plan Comments:         Anesthesia Quick Evaluation

## 2015-05-01 NOTE — Progress Notes (Signed)
ANTIBIOTIC CONSULT NOTE - INITIAL  Pharmacy Consult for Ceftriaxone Indication: UTI  No Known Allergies  Patient Measurements: Height: 5\' 5"  (165.1 cm) IBW/kg (Calculated) : 57  Vital Signs: Temp: 98.6 F (37 C) (11/04 1129) Temp Source: Oral (11/04 1129) BP: 121/57 mmHg (11/04 1129) Pulse Rate: 89 (11/04 1129) Intake/Output from previous day:    Labs:  Recent Labs  05/01/15 0300  WBC 14.4*  HGB 12.3  PLT 260  CREATININE 1.05*   CrCl cannot be calculated (Unknown ideal weight.). No results for input(s): VANCOTROUGH, VANCOPEAK, VANCORANDOM, GENTTROUGH, GENTPEAK, GENTRANDOM, TOBRATROUGH, TOBRAPEAK, TOBRARND, AMIKACINPEAK, AMIKACINTROU, AMIKACIN in the last 72 hours.   Microbiology: No results found for this or any previous visit (from the past 720 hour(s)).  Medical History: Past Medical History  Diagnosis Date  . Migraine   . Hypercholesteremia   . Vitamin D deficiency   . GERD (gastroesophageal reflux disease)   . Cervical cancer ([place])   . Hernia, ventral     Assessment: 53 yoF presented to ED on 11/4 with left flank pain, mild dysuria, and nausea.  CT shows UPJ stone with moderate hydronephrosis.  Urology is consulted for cystoscopy and ureteral stent placement.  She received her first ceftriaxone dose in ED and now Pharmacy is consulted for ceftriaxone dosing.  11/4 >> Ceftriaxone >>  Today, 05/01/2015: Tm 101.7 WBC 14.4 SCr 1.05 Urine culture pending.  Goal of Therapy:  Appropriate abx dosing, eradication of infection.   Plan:   Ceftriaxone 1g IV q24h  Dosage remains stable and need for further dosage adjustment appears unlikely at present.    Pharmacy will sign off at this time.  Please reconsult if a change in clinical status warrants re-evaluation of dosage.   Gretta Arab PharmD, BCPS Pager [phone] 05/01/2015 11:53 AM

## 2015-05-01 NOTE — Transfer of Care (Signed)
Immediate Anesthesia Transfer of Care Note  Patient: [name] Sherman  Procedure(s) Performed: Procedure(s): CYSTOSCOPY WITH RETROGRADE PYELOGRAM AND STENT PLACEMENT (Left)  Patient Location: PACU  Anesthesia Type:General  Level of Consciousness: awake, sedated and responds to stimulation  Airway & Oxygen Therapy: Patient Spontanous Breathing and Patient connected to face mask oxygen  Post-op Assessment: Report given to RN and Post -op Vital signs reviewed and stable  Post vital signs: Reviewed and stable  Last Vitals:  Filed Vitals:   05/01/15 1129  BP: 121/57  Pulse: 89  Temp: 37 C  Resp: 18    Complications: No apparent anesthesia complications

## 2015-05-01 NOTE — Care Management Note (Signed)
Case Management Note  Patient Details  Name: [name] Sherman MRN: [id number] Date of Birth: 1961/05/28  Subjective/Objective:  54 y/o f admitted w/UTI, ureteral stone. From home.                  Action/Plan:d/c plan home.   Expected Discharge Date:   (unknown)               Expected Discharge Plan:  Home/Self Care  In-House Referral:     Discharge planning Services  CM Consult  Post Acute Care Choice:    Choice offered to:     DME Arranged:    DME Agency:     HH Arranged:    HH Agency:     Status of Service:  In process, will continue to follow  Medicare Important Message Given:    Date Medicare IM Given:    Medicare IM give by:    Date Additional Medicare IM Given:    Additional Medicare Important Message give by:     If discussed at [place] of Stay Meetings, dates discussed:    Additional Comments:  Dessa Phi, RN 05/01/2015, 1:47 PM

## 2015-05-01 NOTE — Op Note (Signed)
Preoperative diagnosis: Sepsis, left pyelonephritis, left UPJ stone Postoperative diagnosis: Same  Procedure: Cystoscopy, left retrograde pyelogram, left ureteral stent placement, Foley catheter placement  Surgeon: Alizah Sills  Anesthesia: Gen.  Indication for procedure: 54 year old with symptomatic left UPJ stone, fevers and chills. UA consistent with infection brought for urgent stent.  Findings: On cystoscopy the urethra and the bladder appeared normal. No stone or foreign body in the bladder.  Left retrograde pyelogram-this outlined a single ureter single collecting system unit. The ureter appeared normal. There was a filling defect of the UPJ consistent with the stoneand otherwise the collecting system appeared unremarkable.   Description of procedure: After consent was obtained patient brought to the operating room. After adequate anesthesia he is placed in lithotomy position and prepped and draped in the usual sterile fashion. A timeout was performed to confirm the patient and procedure. The cystoscope was passed per urethra and the bladder inspected. A 6 French open-ended catheter was used to cannulate the left ureteral orifice and retrograde injection of contrast was performed. A sensor wire was advanced into the collecting system in the 6 [place] open-ended catheter removed. A 4.8 by 32 French left ureteral stent was advanced over the wire. The wire was removed with a coil seen in the upper pole collecting system/infundibula and a good coil in the bladder. The scope was removed and a 16 [place] Foley was placed. It was left gravity drainage with clear urine.   Disposition: Patient to recovery room in stable condition.   Complications: None   Blood loss: Minimal   Specimens: None   Drains: 4.8 x 26 cm left ureteral stent,  16 French Foley

## 2015-05-01 NOTE — ED Notes (Signed)
Pt can go to floor at 11:08

## 2015-05-01 NOTE — ED Notes (Signed)
Pt requesting something to eat. Valetta Mole PA aware.  Pt needs to remain NPO until PA speaks with Urology. Made pt aware she can't have anything to eat or drink at this time.

## 2015-05-01 NOTE — ED Provider Notes (Signed)
CSN: [id number]     Arrival date & time 05/01/15  0159 History   First MD Initiated Contact with Patient 05/01/15 0242     Chief Complaint  Patient presents with  . Abdominal Pain     (Consider location/radiation/quality/duration/timing/severity/associated sxs/prior Treatment) Patient is a 54 y.o. female presenting with abdominal pain. The history is provided by the patient. No language interpreter was used.  Abdominal Pain Pain location:  Periumbilical Pain quality: aching and fullness   Pain radiates to:  Does not radiate Pain severity:  Moderate Onset quality:  Gradual Duration:  2 days Progression:  Worsening Associated symptoms: nausea and vomiting   Associated symptoms: no chills, no diarrhea, no dysuria and no fever   Associated symptoms comment:  The patient presents with abdominal pain she is concerned is stemming from her known ventral hernia. Pain progressive over 2 days with some nausea. She had vomiting last night. No fever. She denies urinary symptoms. She has a small bowel movement yesterday. No chest pain or SOB. NO cough.   Past Medical History  Diagnosis Date  . Migraine   . Hypercholesteremia   . Vitamin D deficiency   . GERD (gastroesophageal reflux disease)   . Cervical cancer ([place])   . Hernia, ventral    Past Surgical History  Procedure Laterality Date  . Laser ablation    . Bunionectomy    . Abdominal hysterectomy  age 76    menorrhagia   Family History  Problem Relation Age of Onset  . Alcohol abuse Mother   . Arthritis Father   . Hyperlipidemia Father   . Heart disease Father   . Hypertension Father   . Kidney disease Paternal Uncle   . Cancer Maternal Grandmother     colon  . Cancer Paternal Grandmother     breast   Social History  Substance Use Topics  . Smoking status: Never Smoker   . Smokeless tobacco: None  . Alcohol Use: No     Comment: 1 x year   OB History    No data available     Review of Systems  Constitutional:  Negative for fever and chills.  HENT: Negative.   Respiratory: Negative.   Cardiovascular: Negative.   Gastrointestinal: Positive for nausea, vomiting and abdominal pain. Negative for diarrhea and blood in stool.  Genitourinary: Negative.  Negative for dysuria and flank pain.  Musculoskeletal: Negative.  Negative for myalgias.  Skin: Negative.   Neurological: Negative.       Allergies  Review of patient's allergies indicates no known allergies.  Home Medications   Prior to Admission medications   Medication Sig Start Date End Date Taking? Authorizing Provider  amphetamine-dextroamphetamine (ADDERALL) 20 MG tablet Take 1 tablet (20 mg total) by mouth 2 (two) times daily. 04/03/15   [name], MD  atorvastatin (LIPITOR) 10 MG tablet Take 1 tablet (10 mg total) by mouth daily. 03/14/14   [name], MD  citalopram (CELEXA) 20 MG tablet Take 1 tablet (20 mg total) by mouth daily. Taking 3 tabs at [facility] 03/14/14   [name], MD  fluticasone (FLONASE) 50 MCG/ACT nasal spray Place 2 sprays into both nostrils daily. 09/07/13   Antonietta Breach, PA-C  metroNIDAZOLE (FLAGYL) 500 MG tablet Take 1 tablet (500 mg total) by mouth 2 (two) times daily. 10/30/14   [name], MD  pantoprazole (PROTONIX) 20 MG tablet Take 1 tablet (20 mg total) by mouth daily. 01/24/13   [name], MD  Vitamin D, Ergocalciferol, (DRISDOL) 50000 UNITS CAPS capsule TAKE ONE CAPSULE BY MOUTH ONCE A WEEK 04/23/15   [name], MD   BP 124/59 mmHg  Pulse 90  Temp(Src) 97.9 F (36.6 C) (Oral)  Resp 20  SpO2 98% Physical Exam  Constitutional: She is oriented to person, place, and time. She appears well-developed and well-nourished.  HENT:  Head: Normocephalic.  Neck: Normal range of motion. Neck supple.  Cardiovascular: Normal rate and regular rhythm.   Pulmonary/Chest: Effort normal and breath sounds normal.  Abdominal: Soft. There is tenderness. There is no rebound and no guarding.   Supraumbilical tenderness to soft abdomen. BS hypoactive.   Musculoskeletal: Normal range of motion.  Neurological: She is alert and oriented to person, place, and time.  Skin: Skin is warm and dry. No rash noted.  Psychiatric: She has a normal mood and affect.    ED Course  Procedures (including critical care time) Labs Review Labs Reviewed  CBC - Abnormal; Notable for the following:    WBC 14.4 (*)    All other components within normal limits  LIPASE, BLOOD  COMPREHENSIVE METABOLIC PANEL  URINALYSIS, ROUTINE W REFLEX MICROSCOPIC (NOT AT [facility])  DIFFERENTIAL   Results for orders placed or performed during the hospital encounter of 05/01/15  Lipase, blood  Result Value Ref Range   Lipase 39 11 - 51 U/L  Comprehensive metabolic panel  Result Value Ref Range   Sodium 137 135 - 145 mmol/L   Potassium 4.0 3.5 - 5.1 mmol/L   Chloride 104 101 - 111 mmol/L   CO2 27 22 - 32 mmol/L   Glucose, Bld 118 (H) 65 - 99 mg/dL   BUN 14 6 - 20 mg/dL   Creatinine, Ser 1.05 (H) 0.44 - 1.00 mg/dL   Calcium 8.6 (L) 8.9 - 10.3 mg/dL   Total Protein 6.9 6.5 - 8.1 g/dL   Albumin 3.6 3.5 - 5.0 g/dL   AST 16 15 - 41 U/L   ALT 15 14 - 54 U/L   Alkaline Phosphatase 77 38 - 126 U/L   Total Bilirubin 0.6 0.3 - 1.2 mg/dL   GFR calc non Af Amer 60 (L) >60 mL/min   GFR calc Af Amer >60 >60 mL/min   Anion gap 6 5 - 15  CBC  Result Value Ref Range   WBC 14.4 (H) 4.0 - 10.5 K/uL   RBC 4.42 3.87 - 5.11 MIL/uL   Hemoglobin 12.3 12.0 - 15.0 g/dL   HCT 37.4 36.0 - 46.0 %   MCV 84.6 78.0 - 100.0 fL   MCH 27.8 26.0 - 34.0 pg   MCHC 32.9 30.0 - 36.0 g/dL   RDW 13.1 11.5 - 15.5 %   Platelets 260 150 - 400 K/uL  Urinalysis, Routine w reflex microscopic (not at [facility])  Result Value Ref Range   Color, Urine YELLOW YELLOW   APPearance CLOUDY (A) CLEAR   Specific Gravity, Urine 1.006 1.005 - 1.030   pH 6.5 5.0 - 8.0   Glucose, UA NEGATIVE NEGATIVE mg/dL   Hgb urine dipstick SMALL (A) NEGATIVE   Bilirubin  Urine NEGATIVE NEGATIVE   Ketones, ur NEGATIVE NEGATIVE mg/dL   Protein, ur NEGATIVE NEGATIVE mg/dL   Urobilinogen, UA 0.2 0.0 - 1.0 mg/dL   Nitrite NEGATIVE NEGATIVE   Leukocytes, UA LARGE (A) NEGATIVE  Differential  Result Value Ref Range   Neutrophils Relative % 80 %   Neutro Abs 11.0 (H) 1.7 - 7.7 K/uL   Lymphocytes Relative  11 %   Lymphs Abs 1.5 0.7 - 4.0 K/uL   Monocytes Relative 8 %   Monocytes Absolute 1.1 (H) 0.1 - 1.0 K/uL   Eosinophils Relative 1 %   Eosinophils Absolute 0.1 0.0 - 0.7 K/uL   Basophils Relative 0 %   Basophils Absolute 0.1 0.0 - 0.1 K/uL  Urine microscopic-add on  Result Value Ref Range   WBC, UA 21-50 <3 WBC/hpf   Bacteria, UA MANY (A) RARE   Ct Abdomen Pelvis W Contrast  05/01/2015  CLINICAL DATA:  Upper abdominal pain for 2 days.  Nausea. EXAM: CT ABDOMEN AND PELVIS WITH CONTRAST TECHNIQUE: Multidetector CT imaging of the abdomen and pelvis was performed using the standard protocol following bolus administration of intravenous contrast. CONTRAST:  192mL OMNIPAQUE IOHEXOL 300 MG/ML  SOLN COMPARISON:  12/10/2012 FINDINGS: There is a 5 mm calculus at the left ureteropelvic junction with moderate hydronephrosis and perinephric stranding. Additional collecting system calculi are present bilaterally measuring up to 6 x 8 mm. No other acute findings are evident in the abdomen or pelvis. There are normal appearances of the liver, spleen, pancreas and adrenals. There are normal appearances of the stomach, small bowel and colon. The appendix is normal. Insert basket there is hysterectomy. No adnexal abnormalities are evident. No significant skeletal lesions are evident. There is moderate degenerative disc disease, greatest at L5-S1. There is a mid midline supraumbilical ventral hernia containing omental fat. There is a fat-containing umbilical hernia. There is no significant abnormality in the lower chest. IMPRESSION: 5 mm left UPJ calculus with moderate hydronephrosis.  Bilateral nephrolithiasis Ventral hernia containing omentum, supraumbilical Fat containing umbilical hernia Electronically Signed   By: Andreas Newport M.D.   On: 05/01/2015 05:39    Imaging Review No results found. I have personally reviewed and evaluated these images and lab results as part of my medical decision-making.   EKG Interpretation None      MDM   Final diagnoses:  None    1. Left ureteral stone 2. UTI  Urology paged to consult on stone in setting of urinary tract infection. She has continued to decline pain medication. IV Rocephin given, urine culture pending.   6:15:  Patient has a UA indicative of UTI (21-50 WBC's, large leukocytes, cloudy, many bacteria), a low grade temperature, in the setting of 5 mm UPJ stone on CT, which makes condition more concerning. Discussed with urology who advises to get a catheter specimen UA and call them back for further recommendations.   Patient care signed out to Harlene Ramus, PA-C, for further management at end of shift.   Charlann Lange, PA-C 05/01/15 [place], MD 05/04/15 [phone]

## 2015-05-01 NOTE — Anesthesia Postprocedure Evaluation (Signed)
  Anesthesia Post-op Note  Patient: [name] Sherman  Procedure(s) Performed: Procedure(s): CYSTOSCOPY WITH RETROGRADE PYELOGRAM AND STENT PLACEMENT (Left)  Patient Location: PACU  Anesthesia Type:General  Level of Consciousness: awake  Airway and Oxygen Therapy: Patient Spontanous Breathing  Post-op Pain: mild  Post-op Assessment: Post-op Vital signs reviewed              Post-op Vital Signs: Reviewed  Last Vitals:  Filed Vitals:   05/01/15 1129  BP: 121/57  Pulse: 89  Temp: 37 C  Resp: 18    Complications: No apparent anesthesia complications

## 2015-05-01 NOTE — ED Provider Notes (Signed)
Hand-off from Charlann Lange, [place].  53 yo female with PMH of ventral hernia who presents to the ED with complaint of worsening periumbilical pain, onset 2 days. Endorses nausea, vomiting, chills. Denies fever, urinary sxs, diarrhea. Initial exam revealed supraumbilical TTP, soft abdomen, BS hypoactive. Temp 99.4. WBC 14. UA consistent with UTI. CT with contrast showed ventral hernia without obstruction and 26mm left UPJ stone with moderate hydronephrosis.   [facility] consulted Urology who requested to obtain urine sample via cath. Pt had just voided and is now getting IVF. UA pending. Pt given IV rocephin in ED. Urine culture ordered.  Notified by nurse pt's temp inc to 101.6. Pt given tylenol. Patient reexamined and reports she is in less pain. Exam revealed supraumbilical tenderness, soft abdomen, no peritoneal signs, no CVA tenderness. Urine from cath sample consistent with UTI. Consult to urology, Dr. Junious Silk agrees to admission for stent placement and reports he will come see the patient in the ED. Consultation hospitalist who agrees to admission. Orders placed for med-surg bed and pt to remain NPO. Discussed plan for admission with patient.    Chesley Noon [place], [place] 05/01/15 [place], MD 05/02/15 [phone]

## 2015-05-01 NOTE — Anesthesia Procedure Notes (Signed)
Procedure Name: LMA Insertion Date/Time: 05/01/2015 2:06 PM Performed by: Anne Fu Pre-anesthesia Checklist: Patient identified, Emergency Drugs available, Suction available, Patient being monitored and Timeout performed Patient Re-evaluated:Patient Re-evaluated prior to inductionOxygen Delivery Method: Circle system utilized Preoxygenation: Pre-oxygenation with 100% oxygen Intubation Type: IV induction Ventilation: Mask ventilation without difficulty LMA: LMA inserted LMA Size: 4.0 Number of attempts: 1 Placement Confirmation: positive ETCO2 and breath sounds checked- equal and bilateral Tube secured with: Tape

## 2015-05-01 NOTE — Anesthesia Postprocedure Evaluation (Signed)
  Anesthesia Post-op Note  Patient: [name] Sherman  Procedure(s) Performed: Procedure(s) (LRB): CYSTOSCOPY WITH RETROGRADE PYELOGRAM AND STENT PLACEMENT (Left)  Patient Location: PACU  Anesthesia Type: General  Level of Consciousness: awake and alert   Airway and Oxygen Therapy: Patient Spontanous Breathing  Post-op Pain: mild  Post-op Assessment: Post-op Vital signs reviewed, Patient's Cardiovascular Status Stable, Respiratory Function Stable, Patent Airway and No signs of Nausea or vomiting  Last Vitals:  Filed Vitals:   05/01/15 1919  BP: 121/66  Pulse: 66  Temp: 36.4 C  Resp: 18    Post-op Vital Signs: stable   Complications: No apparent anesthesia complications

## 2015-05-02 LAB — COMPREHENSIVE METABOLIC PANEL
ALT: 21 U/L (ref 14–54)
ANION GAP: 5 (ref 5–15)
AST: 17 U/L (ref 15–41)
Albumin: 3 g/dL — ABNORMAL LOW (ref 3.5–5.0)
Alkaline Phosphatase: 89 U/L (ref 38–126)
BILIRUBIN TOTAL: 0.4 mg/dL (ref 0.3–1.2)
BUN: 11 mg/dL (ref 6–20)
CO2: 27 mmol/L (ref 22–32)
Calcium: 8.4 mg/dL — ABNORMAL LOW (ref 8.9–10.3)
Chloride: 107 mmol/L (ref 101–111)
Creatinine, Ser: 0.83 mg/dL (ref 0.44–1.00)
Glucose, Bld: 137 mg/dL — ABNORMAL HIGH (ref 65–99)
POTASSIUM: 4.1 mmol/L (ref 3.5–5.1)
Sodium: 139 mmol/L (ref 135–145)
TOTAL PROTEIN: 6.6 g/dL (ref 6.5–8.1)

## 2015-05-02 LAB — URINE CULTURE: Culture: 1000

## 2015-05-02 LAB — CBC
HCT: 34.6 % — ABNORMAL LOW (ref 36.0–46.0)
Hemoglobin: 11.6 g/dL — ABNORMAL LOW (ref 12.0–15.0)
MCH: 28.2 pg (ref 26.0–34.0)
MCHC: 33.5 g/dL (ref 30.0–36.0)
MCV: 84 fL (ref 78.0–100.0)
PLATELETS: 255 10*3/uL (ref 150–400)
RBC: 4.12 MIL/uL (ref 3.87–5.11)
RDW: 12.9 % (ref 11.5–15.5)
WBC: 15.6 10*3/uL — ABNORMAL HIGH (ref 4.0–10.5)

## 2015-05-02 MED ORDER — HYDROMORPHONE HCL 1 MG/ML IJ SOLN
1.0000 mg | INTRAMUSCULAR | Status: DC | PRN
  Administered 2015-05-02 – 2015-05-03 (×6): 1 mg via INTRAVENOUS
  Filled 2015-05-02 (×6): qty 1

## 2015-05-02 NOTE — Progress Notes (Signed)
[facility] PROGRESS NOTE  [name] Sherman RAQ:[id number] DOB: 1961-04-25 DOA: 05/01/2015 PCP: [name], MD  Assessment/Plan: 1. Left UPJ calculus with moderate hydronephrosis-patient is status post left ureteral stent placement as per urology. Continue Dilaudid when necessary for pain. Urology to follow. 2. UTI-patient empirically started on Rocephin for possible UTI. Urine culture is pending. We'll follow the urine culture results. 3. Hyperlipidemia-continue atorvastatin 4. DVT prophylaxis-Lovenox  Code Status: Full code Family Communication: *Discussed with patient's husband at bedside Disposition Plan: Home in medically stable   Consultants:  Urology  Procedures:  Cystoscopy with left ureteral stent placement  Antibiotics:  Ceftriaxone  HPI/Subjective: 54 year old female who  has a past medical history of Migraine; Hypercholesteremia; Vitamin D deficiency; GERD (gastroesophageal reflux disease); Cervical cancer ([place]); and Hernia, ventral. Today presents to the hospital with left-sided flank pain for past 2 days. Patient also had nausea but no vomiting or diarrhea. Had mild dysuria but denies hematuria. The pain was constant 9/10 intensity. Did not radiate anywhere. In the ED CT with contrast showed a 5 mm left UPJ stone with moderate hydronephrosis. Urology consulted by the ED physician.  This morning patient still continues to have left flank pain.  Objective: Filed Vitals:   05/02/15 0545  BP: 140/79  Pulse: 65  Temp: 97.6 F (36.4 C)  Resp: 18    Intake/Output Summary (Last 24 hours) at 05/02/15 1348 Last data filed at 05/02/15 0700  Gross per 24 hour  Intake 1983.75 ml  Output    850 ml  Net 1133.75 ml   Filed Weights   05/01/15 1203  Weight: 94.8 kg (208 lb 15.9 oz)    Exam:   General:  Appears in no acute distress  Cardiovascular: S1-S2 regular  Respiratory: Clear to auscultation bilaterally  Abdomen: Soft, left upper quadrant  tenderness, no organomegaly  Musculoskeletal: No cyanosis/clubbing/edema of the lower extremities  Data Reviewed: Basic Metabolic Panel:  Recent Labs Lab 05/01/15 0300 05/02/15 0455  NA 137 139  K 4.0 4.1  CL 104 107  CO2 27 27  GLUCOSE 118* 137*  BUN 14 11  CREATININE 1.05* 0.83  CALCIUM 8.6* 8.4*   Liver Function Tests:  Recent Labs Lab 05/01/15 0300 05/02/15 0455  AST 16 17  ALT 15 21  ALKPHOS 77 89  BILITOT 0.6 0.4  PROT 6.9 6.6  ALBUMIN 3.6 3.0*    Recent Labs Lab 05/01/15 0300  LIPASE 39   No results for input(s): AMMONIA in the last 168 hours. CBC:  Recent Labs Lab 05/01/15 0300 05/02/15 0455  WBC 14.4* 15.6*  NEUTROABS 11.0*  --   HGB 12.3 11.6*  HCT 37.4 34.6*  MCV 84.6 84.0  PLT 260 255   Cardiac Enzymes: No results for input(s): CKTOTAL, CKMB, CKMBINDEX, TROPONINI in the last 168 hours. BNP (last 3 results) No results for input(s): BNP in the last 8760 hours.  ProBNP (last 3 results) No results for input(s): PROBNP in the last 8760 hours.  CBG: No results for input(s): GLUCAP in the last 168 hours.  Recent Results (from the past 240 hour(s))  Urine culture     Status: None   Collection Time: 05/01/15  8:21 AM  Result Value Ref Range Status   Specimen Description URINE, CLEAN CATCH  Final   Special Requests NONE  Final   Culture   Final    1,000 COLONIES/mL INSIGNIFICANT GROWTH Performed at [facility]    Report Status 05/02/2015 FINAL  Final  Surgical pcr screen  Status: None   Collection Time: 05/01/15 12:36 PM  Result Value Ref Range Status   MRSA, PCR NEGATIVE NEGATIVE Final   Staphylococcus aureus NEGATIVE NEGATIVE Final    Comment:        The Xpert SA Assay (FDA approved for NASAL specimens in patients over 7 years of age), is one component of a comprehensive surveillance program.  Test performance has been validated by [facility] for patients greater than or equal to 29 year old. It is not  intended to diagnose infection nor to guide or monitor treatment.      Studies: Ct Abdomen Pelvis W Contrast  05/01/2015  CLINICAL DATA:  Upper abdominal pain for 2 days.  Nausea. EXAM: CT ABDOMEN AND PELVIS WITH CONTRAST TECHNIQUE: Multidetector CT imaging of the abdomen and pelvis was performed using the standard protocol following bolus administration of intravenous contrast. CONTRAST:  162mL OMNIPAQUE IOHEXOL 300 MG/ML  SOLN COMPARISON:  12/10/2012 FINDINGS: There is a 5 mm calculus at the left ureteropelvic junction with moderate hydronephrosis and perinephric stranding. Additional collecting system calculi are present bilaterally measuring up to 6 x 8 mm. No other acute findings are evident in the abdomen or pelvis. There are normal appearances of the liver, spleen, pancreas and adrenals. There are normal appearances of the stomach, small bowel and colon. The appendix is normal. Insert basket there is hysterectomy. No adnexal abnormalities are evident. No significant skeletal lesions are evident. There is moderate degenerative disc disease, greatest at L5-S1. There is a mid midline supraumbilical ventral hernia containing omental fat. There is a fat-containing umbilical hernia. There is no significant abnormality in the lower chest. IMPRESSION: 5 mm left UPJ calculus with moderate hydronephrosis. Bilateral nephrolithiasis Ventral hernia containing omentum, supraumbilical Fat containing umbilical hernia Electronically Signed   By: Andreas Newport M.D.   On: 05/01/2015 05:39    Scheduled Meds: . amphetamine-dextroamphetamine  20 mg Oral BID WC  . atorvastatin  10 mg Oral Daily  . cefTRIAXone (ROCEPHIN)  IV  1 g Intravenous Q24H  . enoxaparin (LOVENOX) injection  40 mg Subcutaneous Q1200   Continuous Infusions: . sodium chloride 75 mL/hr at 05/02/15 0758    Active Problems:   UTI (lower urinary tract infection)   Ureteral stone with hydronephrosis    Time spent: 25  min    [facility]  [facility] Pager [phone]*. If 7PM-7AM, please contact night-coverage at [email or web address], password [facility] 05/02/2015, 1:48 PM  LOS: 1 day

## 2015-05-02 NOTE — Progress Notes (Signed)
  Pt without complaint. Want foley out.   Filed Vitals:   05/02/15 1408  BP: 117/51  Pulse: 66  Temp: 97.8 F (36.6 C)  Resp: 16    NAD Urine clear  CBC    Component Value Date/Time   WBC 15.6* 05/02/2015 0455   RBC 4.12 05/02/2015 0455   HGB 11.6* 05/02/2015 0455   HCT 34.6* 05/02/2015 0455   PLT 255 05/02/2015 0455   MCV 84.0 05/02/2015 0455   MCH 28.2 05/02/2015 0455   MCHC 33.5 05/02/2015 0455   RDW 12.9 05/02/2015 0455   LYMPHSABS 1.5 05/01/2015 0300   MONOABS 1.1* 05/01/2015 0300   EOSABS 0.1 05/01/2015 0300   BASOSABS 0.1 05/01/2015 0300    BMET    Component Value Date/Time   NA 139 05/02/2015 0455   K 4.1 05/02/2015 0455   CL 107 05/02/2015 0455   CO2 27 05/02/2015 0455   GLUCOSE 137* 05/02/2015 0455   BUN 11 05/02/2015 0455   CREATININE 0.83 05/02/2015 0455   CALCIUM 8.4* 05/02/2015 0455   GFRNONAA >60 05/02/2015 0455   GFRAA >60 05/02/2015 0455     A/p - left UPJ stone, UTI, sepsis - improving, Cx's pending. D/c foley.

## 2015-05-03 LAB — CBC
HCT: 36.1 % (ref 36.0–46.0)
Hemoglobin: 11.6 g/dL — ABNORMAL LOW (ref 12.0–15.0)
MCH: 27.6 pg (ref 26.0–34.0)
MCHC: 32.1 g/dL (ref 30.0–36.0)
MCV: 85.7 fL (ref 78.0–100.0)
PLATELETS: 284 10*3/uL (ref 150–400)
RBC: 4.21 MIL/uL (ref 3.87–5.11)
RDW: 13.1 % (ref 11.5–15.5)
WBC: 11.8 10*3/uL — AB (ref 4.0–10.5)

## 2015-05-03 LAB — COMPREHENSIVE METABOLIC PANEL
ALBUMIN: 3.2 g/dL — AB (ref 3.5–5.0)
ALT: 50 U/L (ref 14–54)
AST: 48 U/L — AB (ref 15–41)
Alkaline Phosphatase: 105 U/L (ref 38–126)
Anion gap: 9 (ref 5–15)
BUN: 13 mg/dL (ref 6–20)
CHLORIDE: 105 mmol/L (ref 101–111)
CO2: 27 mmol/L (ref 22–32)
Calcium: 8.2 mg/dL — ABNORMAL LOW (ref 8.9–10.3)
Creatinine, Ser: 0.86 mg/dL (ref 0.44–1.00)
GFR calc Af Amer: >60 mL/min (ref >60)
GLUCOSE: 97 mg/dL (ref 65–99)
POTASSIUM: 3.7 mmol/L (ref 3.5–5.1)
Sodium: 141 mmol/L (ref 135–145)
Total Bilirubin: 0.6 mg/dL (ref 0.3–1.2)
Total Protein: 6.7 g/dL (ref 6.5–8.1)

## 2015-05-03 MED ORDER — CEPHALEXIN 500 MG PO CAPS: 500.0000 mg | ORAL_CAPSULE | Freq: Two times a day (BID) | ORAL | Status: DC

## 2015-05-03 MED ORDER — OXYCODONE-ACETAMINOPHEN 5-325 MG PO TABS: 1.0000 | ORAL_TABLET | Freq: Three times a day (TID) | ORAL | Status: DC | PRN

## 2015-05-03 NOTE — Progress Notes (Signed)
   Doing well. WBC decreased. Urine cx mixed growth.  Looks well  A/P - UTI,  Left UPJ and renal calculi -  -f/u with me in 2 - 3 weeks to plan for URS/stone treatment -agree with cephalexin x 10 days and pain meds

## 2015-05-03 NOTE — Discharge Summary (Signed)
Physician Discharge Summary  [name] Sherman SWF:[id number] DOB: December 07, 1960 DOA: 05/01/2015  PCP: [name], MD  Admit date: 05/01/2015 Discharge date: 05/03/2015  Time spent: 25* minutes  Recommendations for Outpatient Follow-up:  1. Follow up PCP in 2 weeks 2. Follow up Urology in 2 weeks  Discharge Diagnoses:  Active Problems:   UTI (lower urinary tract infection)   Ureteral stone with hydronephrosis   Discharge Condition: Stable  Diet recommendation: regular diet   Filed Weights   05/01/15 1203  Weight: 94.8 kg (208 lb 15.9 oz)    History of present illness:  54 year old female who has a past medical history of Migraine; Hypercholesteremia; Vitamin D deficiency; GERD (gastroesophageal reflux disease); Cervical cancer ([place]); and Hernia, ventral. Today presents to the hospital with left-sided flank pain for past 2 days. Patient also had nausea but no vomiting or diarrhea. Had mild dysuria but denies hematuria. The pain was constant 9/10 intensity. Did not radiate anywhere. In the ED CT with contrast showed a 5 mm left UPJ stone with moderate hydronephrosis. Urology consulted by the ED physician.  Hospital Course:  1. Left UPJ calculus with moderate hydronephrosis-patient is status post left ureteral stent placement, was given Dilaudid prn for pain. Will follow up Urology in 2 weeks for URS/stone treatment. Will discharge on Percocet prn for pain. 2. ? UTI-patient empirically started on Rocephin for possible UTI. Urine culture growing 1000 colonies/ml insignificant growth. Will send home on keflex 500 mg po BID x 10 days. 3. Hyperlipidemia-continue atorvastatin  Procedures: left ureteral stent placement  Consultations:  Urology   Discharge Exam: Filed Vitals:   05/03/15 0625  BP: 129/67  Pulse: 97  Temp: 99.3 F (37.4 C)  Resp: 16    General: Appears in no acute distress Cardiovascular: S1S2 RRR Respiratory: Clear bilaterally  Discharge  Instructions   Discharge Instructions    Diet - low sodium heart healthy    Complete by:  As directed      Increase activity slowly    Complete by:  As directed           Current Discharge Medication List    START taking these medications   Details  cephALEXin (KEFLEX) 500 MG capsule Take 1 capsule (500 mg total) by mouth 2 (two) times daily. Qty: 20 capsule, Refills: 0    oxyCODONE-acetaminophen (ROXICET) 5-325 MG tablet Take 1 tablet by mouth every 8 (eight) hours as needed for severe pain. Qty: 20 tablet, Refills: 0      CONTINUE these medications which have NOT CHANGED   Details  amphetamine-dextroamphetamine (ADDERALL) 20 MG tablet Take 1 tablet (20 mg total) by mouth 2 (two) times daily. Qty: 60 tablet, Refills: 0    atorvastatin (LIPITOR) 10 MG tablet Take 1 tablet (10 mg total) by mouth daily. Qty: 90 tablet, Refills: 3    ibuprofen (ADVIL,MOTRIN) 200 MG tablet Take 600 mg by mouth every 6 (six) hours as needed for moderate pain.    Multiple Vitamin (MULTIVITAMIN WITH MINERALS) TABS tablet Take 1 tablet by mouth daily.    citalopram (CELEXA) 20 MG tablet Take 1 tablet (20 mg total) by mouth daily. Taking 3 tabs at HS Qty: 270 tablet, Refills: 3    Vitamin D, Ergocalciferol, (DRISDOL) 50000 UNITS CAPS capsule TAKE ONE CAPSULE BY MOUTH ONCE A WEEK Qty: 16 capsule, Refills: 3      STOP taking these medications     metroNIDAZOLE (FLAGYL) 500 MG tablet        No Known  Allergies Follow-up Information    Follow up with [facility], MATTHEW, MD.   Specialty:  Urology   Why:  2 - 3 weeks    Contact information:   [place] [address] [phone]       Follow up with [name], MD In 2 weeks.   Specialty:  Family Medicine   Contact information:   [place] [address] [phone]        The results of significant diagnostics from this hospitalization (including imaging, microbiology, ancillary and  laboratory) are listed below for reference.    Significant Diagnostic Studies: Ct Abdomen Pelvis W Contrast  05/01/2015  CLINICAL DATA:  Upper abdominal pain for 2 days.  Nausea. EXAM: CT ABDOMEN AND PELVIS WITH CONTRAST TECHNIQUE: Multidetector CT imaging of the abdomen and pelvis was performed using the standard protocol following bolus administration of intravenous contrast. CONTRAST:  124mL OMNIPAQUE IOHEXOL 300 MG/ML  SOLN COMPARISON:  12/10/2012 FINDINGS: There is a 5 mm calculus at the left ureteropelvic junction with moderate hydronephrosis and perinephric stranding. Additional collecting system calculi are present bilaterally measuring up to 6 x 8 mm. No other acute findings are evident in the abdomen or pelvis. There are normal appearances of the liver, spleen, pancreas and adrenals. There are normal appearances of the stomach, small bowel and colon. The appendix is normal. Insert basket there is hysterectomy. No adnexal abnormalities are evident. No significant skeletal lesions are evident. There is moderate degenerative disc disease, greatest at L5-S1. There is a mid midline supraumbilical ventral hernia containing omental fat. There is a fat-containing umbilical hernia. There is no significant abnormality in the lower chest. IMPRESSION: 5 mm left UPJ calculus with moderate hydronephrosis. Bilateral nephrolithiasis Ventral hernia containing omentum, supraumbilical Fat containing umbilical hernia Electronically Signed   By: Andreas Newport M.D.   On: 05/01/2015 05:39    Microbiology: Recent Results (from the past 240 hour(s))  Urine culture     Status: None   Collection Time: 05/01/15  8:21 AM  Result Value Ref Range Status   Specimen Description URINE, CLEAN CATCH  Final   Special Requests NONE  Final   Culture   Final    1,000 COLONIES/mL INSIGNIFICANT GROWTH Performed at [facility]    Report Status 05/02/2015 FINAL  Final  Surgical pcr screen     Status: None   Collection  Time: 05/01/15 12:36 PM  Result Value Ref Range Status   MRSA, PCR NEGATIVE NEGATIVE Final   Staphylococcus aureus NEGATIVE NEGATIVE Final    Comment:        The Xpert SA Assay (FDA approved for NASAL specimens in patients over 1 years of age), is one component of a comprehensive surveillance program.  Test performance has been validated by [facility] for patients greater than or equal to 31 year old. It is not intended to diagnose infection nor to guide or monitor treatment.      Labs: Basic Metabolic Panel:  Recent Labs Lab 05/01/15 0300 05/02/15 0455 05/03/15 0505  NA 137 139 141  K 4.0 4.1 3.7  CL 104 107 105  CO2 27 27 27   GLUCOSE 118* 137* 97  BUN 14 11 13   CREATININE 1.05* 0.83 0.86  CALCIUM 8.6* 8.4* 8.2*   Liver Function Tests:  Recent Labs Lab 05/01/15 0300 05/02/15 0455 05/03/15 0505  AST 16 17 48*  ALT 15 21 50  ALKPHOS 77 89 105  BILITOT 0.6 0.4 0.6  PROT 6.9  6.6 6.7  ALBUMIN 3.6 3.0* 3.2*    Recent Labs Lab 05/01/15 0300  LIPASE 39   No results for input(s): AMMONIA in the last 168 hours. CBC:  Recent Labs Lab 05/01/15 0300 05/02/15 0455 05/03/15 0505  WBC 14.4* 15.6* 11.8*  NEUTROABS 11.0*  --   --   HGB 12.3 11.6* 11.6*  HCT 37.4 34.6* 36.1  MCV 84.6 84.0 85.7  PLT 260 255 284       Signed:  Giles Currie S  [facility] 05/03/2015, 1:01 PM

## 2015-05-05 ENCOUNTER — Telehealth: Payer: Self-pay | Admitting: *Deleted

## 2015-05-05 MED ORDER — ONDANSETRON HCL 8 MG PO TABS: 8.0000 mg | ORAL_TABLET | Freq: Three times a day (TID) | ORAL | Status: DC | PRN

## 2015-05-05 NOTE — Telephone Encounter (Signed)
Zofran 8 mg po q 8 hours prn nausea  #12

## 2015-05-05 NOTE — Telephone Encounter (Signed)
rx sent to pharmacy and Left message on machine for patient

## 2015-05-05 NOTE — Telephone Encounter (Signed)
Transition Care Management Follow-up Telephone Call  How have you been since you were released from the hospital? Has some nausea   Do you understand why you were in the hospital? yes   Do you understand the discharge instrcutions? yes  Items Reviewed:  Medications reviewed: yes  Allergies reviewed: yes  Dietary changes reviewed: yes  Referrals reviewed: yes   Functional Questionnaire:   Activities of Daily Living (ADLs):   She states they are independent in the following: ambulation, bathing and hygiene, feeding, continence, grooming, toileting and dressing States they require assistance with the following: none   Any transportation issues/concerns?: no   Any patient concerns? yes, patient complaining of nausea   Confirmed importance and date/time of follow-up visits scheduled: yes   Confirmed with patient if condition begins to worsen call PCP or go to the ER.  Patient was given the Call-a-Nurse line [phone]: yes Patient was discharged 05/03/15 Patient was discharged to home Patient has an appointment with Dr Elease Hashimoto 05/11/15

## 2015-05-05 NOTE — Telephone Encounter (Signed)
Patient is requesting a prescription for nausea.  She was discharged from hospital 05/11/15 and is still having nausea.

## 2015-05-11 ENCOUNTER — Ambulatory Visit (INDEPENDENT_AMBULATORY_CARE_PROVIDER_SITE_OTHER): Payer: 59 | Admitting: Family Medicine

## 2015-05-11 VITALS — BP 110/80 | HR 84 | Temp 97.8°F | Resp 16 | Ht 65.0 in | Wt 206.8 lb

## 2015-05-11 DIAGNOSIS — N200 Calculus of kidney: Secondary | ICD-10-CM

## 2015-05-11 DIAGNOSIS — N132 Hydronephrosis with renal and ureteral calculous obstruction: Secondary | ICD-10-CM

## 2015-05-11 DIAGNOSIS — Z23 Encounter for immunization: Secondary | ICD-10-CM

## 2015-05-11 MED ORDER — PHENAZOPYRIDINE HCL 200 MG PO TABS: 200.0000 mg | ORAL_TABLET | Freq: Three times a day (TID) | ORAL | Status: DC | PRN

## 2015-05-11 NOTE — Progress Notes (Signed)
   Subjective:    Patient ID: [name] Sherman, female    DOB: April 22, 1961, 54 y.o.   MRN: [id number]  HPI   Patient seen for transitional care follow-up. She was admitted on 05/01/2015 with left-sided flank pain for 2 days. She initially thought this was pain from her ventral hernia. She had mild dysuria but no hematuria. Her pain was intense. CT scan in the emergency department revealed 5 mm left UPJ stone with moderate hydronephrosis. Patient had stent placed and has follow-up with urology on the 23rd of this month for laser treatment. She still has some dysuria. Urine culture was insignificant growth. She was given Rocephin and discharged on Keflex 500 mg twice a day for 10 days. She still has some discomfort with urination at this time. She has taken Azo-Standard without much relief. No fevers or chills. She has some low-grade nausea and minimal relief with Zofran. No vomiting.  Past Medical History  Diagnosis Date  . Migraine   . Hypercholesteremia   . Vitamin D deficiency   . GERD (gastroesophageal reflux disease)   . Cervical cancer ([place])   . Hernia, ventral    Past Surgical History  Procedure Laterality Date  . Laser ablation    . Bunionectomy    . Abdominal hysterectomy  age 47    menorrhagia  . Cystoscopy with retrograde pyelogram, ureteroscopy and stent placement Left 05/01/2015    Procedure: CYSTOSCOPY WITH RETROGRADE PYELOGRAM AND STENT PLACEMENT;  Surgeon: Festus Aloe, MD;  Location: [facility];  Service: Urology;  Laterality: Left;    reports that she has never smoked. She does not have any smokeless tobacco history on file. She reports that she does not drink alcohol or use illicit drugs. family history includes Alcohol abuse in her mother; Arthritis in her father; Cancer in her maternal grandmother and paternal grandmother; Heart disease in her father; Hyperlipidemia in her father; Hypertension in her father; Kidney disease in her paternal uncle. No Known  Allergies    Review of Systems  Constitutional: Negative for fever and chills.  Respiratory: Negative for shortness of breath.   Gastrointestinal: Negative for vomiting and abdominal pain.  Genitourinary: Positive for dysuria. Negative for urgency and hematuria.  Musculoskeletal: Positive for back pain.       Objective:   Physical Exam  Constitutional: She appears well-developed and well-nourished. No distress.  Cardiovascular: Normal rate and regular rhythm.   Pulmonary/Chest: Effort normal and breath sounds normal. No respiratory distress. She has no wheezes.  Abdominal: Soft. Bowel sounds are normal. She exhibits no distension and no mass. There is no tenderness. There is no rebound and no guarding.  Musculoskeletal: She exhibits no edema.          Assessment & Plan:  Nephrolithiasis with 5 mm left UPJ stone with moderate hydronephrosis. Patient has stent and follow-up with urology scheduled on the 23rd. Prescription for Pyridium 200 mg every 8 hours as needed. Finish out Keflex. Follow-up promptly for any fever or change of symptoms.  She is getting pain medications from Urology.

## 2015-05-11 NOTE — Patient Instructions (Signed)
Dietary Guidelines to Help Prevent Kidney Stones Your risk of kidney stones can be decreased by adjusting the foods you eat. The most important thing you can do is drink enough fluid. You should drink enough fluid to keep your urine clear or pale yellow. The following guidelines provide specific information for the type of kidney stone you have had. GUIDELINES ACCORDING TO TYPE OF KIDNEY STONE Calcium Oxalate Kidney Stones  Reduce the amount of salt you eat. Foods that have a lot of salt cause your body to release excess calcium into your urine. The excess calcium can combine with a substance called oxalate to form kidney stones.  Reduce the amount of animal protein you eat if the amount you eat is excessive. Animal protein causes your body to release excess calcium into your urine. Ask your dietitian how much protein from animal sources you should be eating.  Avoid foods that are high in oxalates. If you take vitamins, they should have less than 500 mg of vitamin C. Your body turns vitamin C into oxalates. You do not need to avoid fruits and vegetables high in vitamin C. Calcium Phosphate Kidney Stones  Reduce the amount of salt you eat to help prevent the release of excess calcium into your urine.  Reduce the amount of animal protein you eat if the amount you eat is excessive. Animal protein causes your body to release excess calcium into your urine. Ask your dietitian how much protein from animal sources you should be eating.  Get enough calcium from food or take a calcium supplement (ask your dietitian for recommendations). Food sources of calcium that do not increase your risk of kidney stones include:  Broccoli.  Dairy products, such as cheese and yogurt.  Pudding. Uric Acid Kidney Stones  Do not have more than 6 oz of animal protein per day. FOOD SOURCES Animal Protein Sources  Meat (all types).  Poultry.  Eggs.  Fish, seafood. Foods High in Salt  Salt seasonings.  Soy  sauce.  Teriyaki sauce.  Cured and processed meats.  Salted crackers and snack foods.  Fast food.  Canned soups and most canned foods. Foods High in Oxalates  Grains:  Amaranth.  Barley.  Grits.  Wheat germ.  Bran.  Buckwheat flour.  All bran cereals.  Pretzels.  Whole wheat bread.  Vegetables:  Beans (wax).  Beets and beet greens.  Collard greens.  Eggplant.  [place]ole.  Leeks.  Okra.  Parsley.  R[place]abagas.  Spinach.  Swiss chard.  Tomato paste.  Fried potatoes.  Sweet potatoes.  Fruits:  Red currants.  Figs.  Kiwi.  Rhubarb.  Meat and Other Protein Sources:  Beans (dried).  Soy burgers and other soybean products.  Miso.  Nuts (peanuts, almonds, pecans, cashews, hazelnuts).  Nut butters.  Sesame seeds and tahini (paste made of sesame seeds).  Poppy seeds.  Beverages:  Chocolate drink mixes.  Soy milk.  Instant iced tea.  Juices made from high-oxalate fruits or vegetables.  Other:  Carob.  Chocolate.  Fruitcake.  Marmalades.   This information is not intended to replace advice given to you by your health care provider. Make sure you discuss any questions you have with your health care provider.   Document Released: 10/08/2010 Document Revised: 06/18/2013 Document Reviewed: 05/10/2013 Elsevier Interactive Patient Education 2016 Elsevier Inc.  

## 2015-05-11 NOTE — Progress Notes (Signed)
Pre visit review using our clinic review tool, if applicable. No additional management support is needed unless otherwise documented below in the visit note. 

## 2015-05-18 ENCOUNTER — Encounter (HOSPITAL_BASED_OUTPATIENT_CLINIC_OR_DEPARTMENT_OTHER): Payer: Self-pay | Admitting: Emergency Medicine

## 2015-05-18 ENCOUNTER — Emergency Department (HOSPITAL_COMMUNITY)
Admission: EM | Admit: 2015-05-18 | Discharge: 2015-05-18 | Discharge status: Against Medical Advice | Payer: Self-pay | Attending: Emergency Medicine | Admitting: Emergency Medicine

## 2015-05-18 ENCOUNTER — Emergency Department (HOSPITAL_BASED_OUTPATIENT_CLINIC_OR_DEPARTMENT_OTHER): Payer: Medicaid Other

## 2015-05-18 ENCOUNTER — Encounter (HOSPITAL_COMMUNITY): Payer: Self-pay

## 2015-05-18 ENCOUNTER — Emergency Department (HOSPITAL_BASED_OUTPATIENT_CLINIC_OR_DEPARTMENT_OTHER)
Admission: EM | Admit: 2015-05-18 | Discharge: 2015-05-18 | Disposition: A | Discharge status: Home / Self Care | Payer: Medicaid Other | Attending: Emergency Medicine | Admitting: Emergency Medicine

## 2015-05-18 DIAGNOSIS — Z791 Long term (current) use of non-steroidal anti-inflammatories (NSAID): Secondary | ICD-10-CM | POA: Insufficient documentation

## 2015-05-18 DIAGNOSIS — E7800 Pure hypercholesterolemia, unspecified: Secondary | ICD-10-CM | POA: Insufficient documentation

## 2015-05-18 DIAGNOSIS — Z8679 Personal history of other diseases of the circulatory system: Secondary | ICD-10-CM | POA: Insufficient documentation

## 2015-05-18 DIAGNOSIS — N23 Unspecified renal colic: Secondary | ICD-10-CM

## 2015-05-18 DIAGNOSIS — Z90710 Acquired absence of both cervix and uterus: Secondary | ICD-10-CM | POA: Insufficient documentation

## 2015-05-18 DIAGNOSIS — Z8719 Personal history of other diseases of the digestive system: Secondary | ICD-10-CM | POA: Insufficient documentation

## 2015-05-18 DIAGNOSIS — Z79899 Other long term (current) drug therapy: Secondary | ICD-10-CM | POA: Insufficient documentation

## 2015-05-18 DIAGNOSIS — F909 Attention-deficit hyperactivity disorder, unspecified type: Secondary | ICD-10-CM | POA: Insufficient documentation

## 2015-05-18 DIAGNOSIS — Z8541 Personal history of malignant neoplasm of cervix uteri: Secondary | ICD-10-CM | POA: Insufficient documentation

## 2015-05-18 DIAGNOSIS — Z8669 Personal history of other diseases of the nervous system and sense organs: Secondary | ICD-10-CM | POA: Insufficient documentation

## 2015-05-18 DIAGNOSIS — R109 Unspecified abdominal pain: Secondary | ICD-10-CM | POA: Insufficient documentation

## 2015-05-18 DIAGNOSIS — E559 Vitamin D deficiency, unspecified: Secondary | ICD-10-CM | POA: Insufficient documentation

## 2015-05-18 LAB — BASIC METABOLIC PANEL
ANION GAP: 6 (ref 5–15)
BUN: 12 mg/dL (ref 6–20)
CHLORIDE: 105 mmol/L (ref 101–111)
CO2: 26 mmol/L (ref 22–32)
Calcium: 8.8 mg/dL — ABNORMAL LOW (ref 8.9–10.3)
Creatinine, Ser: 0.8 mg/dL (ref 0.44–1.00)
Glucose, Bld: 103 mg/dL — ABNORMAL HIGH (ref 65–99)
POTASSIUM: 3.8 mmol/L (ref 3.5–5.1)
SODIUM: 137 mmol/L (ref 135–145)

## 2015-05-18 LAB — URINALYSIS, ROUTINE W REFLEX MICROSCOPIC
Bilirubin Urine: NEGATIVE
Glucose, UA: NEGATIVE mg/dL
Ketones, ur: NEGATIVE mg/dL
Nitrite: NEGATIVE
PROTEIN: 100 mg/dL — AB
SPECIFIC GRAVITY, URINE: 1.02 (ref 1.005–1.030)
pH: 5.5 (ref 5.0–8.0)

## 2015-05-18 LAB — CBC WITH DIFFERENTIAL/PLATELET
BASOS ABS: 0.1 10*3/uL (ref 0.0–0.1)
Basophils Relative: 1 %
EOS PCT: 3 %
Eosinophils Absolute: 0.3 10*3/uL (ref 0.0–0.7)
HEMATOCRIT: 38 % (ref 36.0–46.0)
HEMOGLOBIN: 12.3 g/dL (ref 12.0–15.0)
LYMPHS ABS: 2.6 10*3/uL (ref 0.7–4.0)
LYMPHS PCT: 29 %
MCH: 27.5 pg (ref 26.0–34.0)
MCHC: 32.4 g/dL (ref 30.0–36.0)
MCV: 84.8 fL (ref 78.0–100.0)
Monocytes Absolute: 0.7 10*3/uL (ref 0.1–1.0)
Monocytes Relative: 8 %
NEUTROS ABS: 5.2 10*3/uL (ref 1.7–7.7)
NEUTROS PCT: 59 %
PLATELETS: 362 10*3/uL (ref 150–400)
RBC: 4.48 MIL/uL (ref 3.87–5.11)
RDW: 13.3 % (ref 11.5–15.5)
WBC: 8.8 10*3/uL (ref 4.0–10.5)

## 2015-05-18 LAB — URINE MICROSCOPIC-ADD ON

## 2015-05-18 MED ORDER — ONDANSETRON HCL 4 MG/2ML IJ SOLN
4.0000 mg | Freq: Once | INTRAMUSCULAR | Status: AC
  Administered 2015-05-18: 4 mg via INTRAVENOUS
  Filled 2015-05-18: qty 2

## 2015-05-18 MED ORDER — KETOROLAC TROMETHAMINE 30 MG/ML IJ SOLN: 30.0000 mg | Freq: Once | INTRAMUSCULAR | Status: DC

## 2015-05-18 MED ORDER — OXYCODONE HCL 5 MG PO TABS: 5.0000 mg | ORAL_TABLET | ORAL | Status: DC | PRN

## 2015-05-18 MED ORDER — HYDROMORPHONE HCL 1 MG/ML IJ SOLN
1.0000 mg | Freq: Once | INTRAMUSCULAR | Status: AC
  Administered 2015-05-18: 1 mg via INTRAVENOUS
  Filled 2015-05-18: qty 1

## 2015-05-18 NOTE — ED Notes (Signed)
Pt states she cannot wait any longer due to her pain and is going to find another facility with a shorter wait time.

## 2015-05-18 NOTE — ED Notes (Signed)
Patient transported to Ultrasound 

## 2015-05-18 NOTE — ED Notes (Signed)
Patient states that she is having pain to her back, her flank, her "vaginal" area. The patient has known kidney stones and is planned for lithotripsy later this week

## 2015-05-18 NOTE — Discharge Instructions (Signed)
Read the information below.  Use the prescribed medication as directed.  Please discuss all new medications with your pharmacist.  You may return to the Emergency Department at any time for worsening condition or any new symptoms that concern you.  If you develop high fevers, worsening abdominal or flank pain, uncontrolled vomiting, or are unable to tolerate fluids by mouth, return to the ER for a recheck.     Renal Colic Renal colic is pain that is caused by passing a kidney stone. The pain can be sharp and severe. It may be felt in the back, abdomen, side (flank), or groin. It can cause nausea. Renal colic can come and go. HOME CARE INSTRUCTIONS Watch your condition for any changes. The following actions may help to lessen any discomfort that you are feeling:  Take medicines only as directed by your health care provider.  Ask your health care provider if it is okay to take over-the-counter pain medicine.  Drink enough fluid to keep your urine clear or pale yellow. Drink 6-8 glasses of water each day.  Limit the amount of salt that you eat to less than 2 grams per day.  Reduce the amount of protein in your diet. Eat less meat, fish, nuts, and dairy.  Avoid foods such as spinach, rhubarb, nuts, or bran. These may make kidney stones more likely to form. SEEK MEDICAL CARE IF:  You have a fever or chills.  Your urine smells bad or looks cloudy.  You have pain or burning when you pass urine. SEEK IMMEDIATE MEDICAL CARE IF:  Your flank pain or groin pain suddenly worsens.  You become confused or disoriented or you lose consciousness.   This information is not intended to replace advice given to you by your health care provider. Make sure you discuss any questions you have with your health care provider.   Document Released: 03/23/2005 Document Revised: 07/04/2014 Document Reviewed: 04/23/2014 Elsevier Interactive Patient Education 2016 [employer].  Kidney Stones Kidney stones  (urolithiasis) are deposits that form inside your kidneys. The intense pain is caused by the stone moving through the urinary tract. When the stone moves, the ureter goes into spasm around the stone. The stone is usually passed in the urine.  CAUSES   A disorder that makes certain neck glands produce too much parathyroid hormone (primary hyperparathyroidism).  A buildup of uric acid crystals, similar to gout in your joints.  Narrowing (stricture) of the ureter.  A kidney obstruction present at birth (congenital obstruction).  Previous surgery on the kidney or ureters.  Numerous kidney infections. SYMPTOMS   Feeling sick to your stomach (nauseous).  Throwing up (vomiting).  Blood in the urine (hematuria).  Pain that usually spreads (radiates) to the groin.  Frequency or urgency of urination. DIAGNOSIS   Taking a history and physical exam.  Blood or urine tests.  CT scan.  Occasionally, an examination of the inside of the urinary bladder (cystoscopy) is performed. TREATMENT   Observation.  Increasing your fluid intake.  Extracorporeal shock wave lithotripsy--This is a noninvasive procedure that uses shock waves to break up kidney stones.  Surgery may be needed if you have severe pain or persistent obstruction. There are various surgical procedures. Most of the procedures are performed with the use of small instruments. Only small incisions are needed to accommodate these instruments, so recovery time is minimized. The size, location, and chemical composition are all important variables that will determine the proper choice of action for you. Talk to your  health care provider to better understand your situation so that you will minimize the risk of injury to yourself and your kidney.  HOME CARE INSTRUCTIONS   Drink enough water and fluids to keep your urine clear or pale yellow. This will help you to pass the stone or stone fragments.  Strain all urine through the  provided strainer. Keep all particulate matter and stones for your health care provider to see. The stone causing the pain may be as small as a grain of salt. It is very important to use the strainer each and every time you pass your urine. The collection of your stone will allow your health care provider to analyze it and verify that a stone has actually passed. The stone analysis will often identify what you can do to reduce the incidence of recurrences.  Only take over-the-counter or prescription medicines for pain, discomfort, or fever as directed by your health care provider.  Keep all follow-up visits as told by your health care provider. This is important.  Get follow-up X-rays if required. The absence of pain does not always mean that the stone has passed. It may have only stopped moving. If the urine remains completely obstructed, it can cause loss of kidney function or even complete destruction of the kidney. It is your responsibility to make sure X-rays and follow-ups are completed. Ultrasounds of the kidney can show blockages and the status of the kidney. Ultrasounds are not associated with any radiation and can be performed easily in a matter of minutes.  Make changes to your daily diet as told by your health care provider. You may be told to:  Limit the amount of salt that you eat.  Eat 5 or more servings of fruits and vegetables each day.  Limit the amount of meat, poultry, fish, and eggs that you eat.  Collect a 24-hour urine sample as told by your health care provider.You may need to collect another urine sample every 6-12 months. SEEK MEDICAL CARE IF:  You experience pain that is progressive and unresponsive to any pain medicine you have been prescribed. SEEK IMMEDIATE MEDICAL CARE IF:   Pain cannot be controlled with the prescribed medicine.  You have a fever or shaking chills.  The severity or intensity of pain increases over 18 hours and is not relieved by pain  medicine.  You develop a new onset of abdominal pain.  You feel faint or pass out.  You are unable to urinate.   This information is not intended to replace advice given to you by your health care provider. Make sure you discuss any questions you have with your health care provider.   Document Released: 06/13/2005 Document Revised: 03/04/2015 Document Reviewed: 11/14/2012 Elsevier Interactive Patient Education [employer].

## 2015-05-18 NOTE — ED Notes (Signed)
Pt c/o L side abdominal pain and L flank pain x 2 days.  Pain score 8/10.  Pt reports hx of kidney stones and is supposed to have a procedure on 11/23.  Pt reports taking all medications as prescribed w/o relief.

## 2015-05-18 NOTE — ED Provider Notes (Signed)
CSN: [id number]     Arrival date & time 05/18/15  1832 History   First MD Initiated Contact with Patient 05/18/15 1905     Chief Complaint  Patient presents with  . Flank Pain     (Consider location/radiation/quality/duration/timing/severity/associated sxs/prior Treatment) The history is provided by the patient and medical records.     Pt with known left ureteral stone with stent in place and lithotripsy scheduled in two days with Dr Junious Silk presents with uncontrolled pain of the left flank.  She states she has stones that are 8, 6, and 49mm and has a stent in place currently.  Has dysuria and frequency, urgency, that is unchanged since she was diagnosed earlier this month.  Denies fevers, vaginal symptoms, vomiting.  Has had diarrhea x 2 days.  Has been taking Keflex as directed.  Is taking oxycodone - 1 tab Q 6 hours without relief.   Past Medical History  Diagnosis Date  . Migraine   . Hypercholesteremia   . Vitamin D deficiency   . GERD (gastroesophageal reflux disease)   . Cervical cancer ([place])   . Hernia, ventral    Past Surgical History  Procedure Laterality Date  . Laser ablation    . Bunionectomy    . Abdominal hysterectomy  age 54    menorrhagia  . Cystoscopy with retrograde pyelogram, ureteroscopy and stent placement Left 05/01/2015    Procedure: CYSTOSCOPY WITH RETROGRADE PYELOGRAM AND STENT PLACEMENT;  Surgeon: Festus Aloe, MD;  Location: [facility];  Service: Urology;  Laterality: Left;   Family History  Problem Relation Age of Onset  . Alcohol abuse Mother   . Arthritis Father   . Hyperlipidemia Father   . Heart disease Father   . Hypertension Father   . Kidney disease Paternal Uncle   . Cancer Maternal Grandmother     colon  . Cancer Paternal Grandmother     breast   Social History  Substance Use Topics  . Smoking status: Never Smoker   . Smokeless tobacco: None  . Alcohol Use: No     Comment: 1 x year   OB History    No data available      Review of Systems  All other systems reviewed and are negative.     Allergies  Review of patient's allergies indicates no known allergies.  Home Medications   Prior to Admission medications   Medication Sig Start Date End Date Taking? Authorizing Provider  amphetamine-dextroamphetamine (ADDERALL) 20 MG tablet Take 1 tablet (20 mg total) by mouth 2 (two) times daily. 04/03/15   [name], MD  atorvastatin (LIPITOR) 10 MG tablet Take 1 tablet (10 mg total) by mouth daily. 03/14/14   [name], MD  cephALEXin (KEFLEX) 500 MG capsule Take 1 capsule (500 mg total) by mouth 2 (two) times daily. 05/03/15   Oswald Hillock, MD  citalopram (CELEXA) 20 MG tablet Take 1 tablet (20 mg total) by mouth daily. Taking 3 tabs at [facility] 03/14/14   [name], MD  ibuprofen (ADVIL,MOTRIN) 200 MG tablet Take 600 mg by mouth every 6 (six) hours as needed for moderate pain.    Historical Provider, MD  Multiple Vitamin (MULTIVITAMIN WITH MINERALS) TABS tablet Take 1 tablet by mouth daily.    Historical Provider, MD  ondansetron (ZOFRAN) 8 MG tablet Take 1 tablet (8 mg total) by mouth every 8 (eight) hours as needed for nausea or vomiting. 05/05/15   [name], MD  oxyCODONE-acetaminophen (ROXICET) [phone]  MG tablet Take 1 tablet by mouth every 8 (eight) hours as needed for severe pain. 05/03/15   Oswald Hillock, MD  phenazopyridine (PYRIDIUM) 200 MG tablet Take 1 tablet (200 mg total) by mouth 3 (three) times daily as needed for pain. 05/11/15   [name], MD  Vitamin D, Ergocalciferol, (DRISDOL) 50000 UNITS CAPS capsule TAKE ONE CAPSULE BY MOUTH ONCE A WEEK 04/23/15   [name], MD   BP 130/73 mmHg  Pulse 73  Temp(Src) 98.3 F (36.8 C) (Oral)  Resp 18  Ht 5\' 5"  (1.651 m)  Wt 92.987 kg  BMI 34.11 kg/m2  SpO2 98% Physical Exam  Constitutional: She appears well-developed and well-nourished. No distress.  HENT:  Head: Normocephalic and atraumatic.  Neck: Neck supple.   Cardiovascular: Normal rate and regular rhythm.   Pulmonary/Chest: Effort normal and breath sounds normal. No respiratory distress. She has no wheezes. She has no rales.  Abdominal: Soft. She exhibits no distension. There is tenderness. There is no rebound and no guarding.  Tenderness throughout left abdomen and flank.  +left CVA tenderness  Neurological: She is alert.  Skin: She is not diaphoretic.  Nursing note and vitals reviewed.   ED Course  Procedures (including critical care time) Labs Review Labs Reviewed  URINALYSIS, ROUTINE W REFLEX MICROSCOPIC (NOT AT [facility]) - Abnormal; Notable for the following:    APPearance CLOUDY (*)    Hgb urine dipstick LARGE (*)    Protein, ur 100 (*)    Leukocytes, UA SMALL (*)    All other components within normal limits  BASIC METABOLIC PANEL - Abnormal; Notable for the following:    Glucose, Bld 103 (*)    Calcium 8.8 (*)    All other components within normal limits  URINE MICROSCOPIC-ADD ON - Abnormal; Notable for the following:    Squamous Epithelial / LPF 6-30 (*)    Bacteria, UA RARE (*)    All other components within normal limits  URINE CULTURE  CBC WITH DIFFERENTIAL/PLATELET    Imaging Review US Renal  05/18/2015  CLINICAL DATA:  54 year old current history of urinary tract calculi, left double-J ureteral stent placed 05/01/2015, acute onset of left flank pain 3 days ago with increased intensity today. Patient scheduled for lithotripsy in 2 days. EXAM: RENAL / URINARY TRACT ULTRASOUND COMPLETE COMPARISON:  CT abdomen and pelvis 05/01/2015, 12/10/2012. Complete abdominal ultrasound 04/21/2011. FINDINGS: Right Kidney: Length: Approximately 11.0 cm. No hydronephrosis. Well-preserved cortex. Normal parenchymal echotexture. Approximate 6 mm calculus and 3 mm calculus in mid calices as noted on recent CT. No focal parenchymal abnormality. Left Kidney: Length: Approximately 12.0 cm. No hydronephrosis. Well-preserved cortex. Normal parenchymal  echotexture. Approximate 8 mm calculus in a lower pole calix as noted on recent CT. No focal parenchymal abnormality. Bladder: Decompressed. Right ureteral jet visualized a color Doppler evaluation. Left ureteral jet not visualized. IMPRESSION: 1. No evidence of hydronephrosis involving either kidney. 2. Bilateral renal calculi as noted on the recent CT. Electronically Signed   By: Evangeline Dakin M.D.   On: 05/18/2015 20:53   I have personally reviewed and evaluated these images and lab results as part of my medical decision-making.   EKG Interpretation None       8:27 PM Pt reports she feels much better after single dose of dilaudid.    MDM   Final diagnoses:  Ureteral colic   Afebrile, nontoxic patient with known ureteral stones presents with uncontrolled pain.  Pain controlled with single dose of medication.  Renal US unremarkable, no hydronephrosis.  UA does not appear infected.  Labs unremarkable.    D/C home with increased frequency and dosage of pain medication ,follow up with Dr Junious Silk as planned.  Urine sent for culture.  Discussed result, findings, treatment, and follow up  with patient.  Pt given return precautions.  Pt verbalizes understanding and agrees with plan.        Clayton Bibles, PA-C 05/19/15 [id number]  Debby Freiberg, MD 05/20/15 [phone]

## 2015-05-20 ENCOUNTER — Other Ambulatory Visit: Payer: Self-pay | Admitting: Urology

## 2015-05-20 ENCOUNTER — Encounter (HOSPITAL_BASED_OUTPATIENT_CLINIC_OR_DEPARTMENT_OTHER): Payer: Self-pay | Admitting: *Deleted

## 2015-05-20 LAB — URINE CULTURE

## 2015-05-20 NOTE — Progress Notes (Signed)
NPO AFTER MN.  ARRIVE AT 1000.  CURRENT LAB RESULTS IN CHART AND EPIC.  WILL TAKE FLOMAX AND VESICARE AM DOS W/ SIPS OF WATER AND IF NEEDED TAKE PAIN/ NAUSEA RX.

## 2015-05-21 ENCOUNTER — Emergency Department (HOSPITAL_COMMUNITY)
Admission: EM | Admit: 2015-05-21 | Discharge: 2015-05-21 | Disposition: A | Discharge status: Home / Self Care | Payer: Self-pay | Attending: Emergency Medicine | Admitting: Emergency Medicine

## 2015-05-21 ENCOUNTER — Encounter (HOSPITAL_COMMUNITY): Payer: Self-pay | Admitting: *Deleted

## 2015-05-21 ENCOUNTER — Emergency Department (HOSPITAL_COMMUNITY): Payer: Self-pay

## 2015-05-21 DIAGNOSIS — E785 Hyperlipidemia, unspecified: Secondary | ICD-10-CM | POA: Insufficient documentation

## 2015-05-21 DIAGNOSIS — Z9851 Tubal ligation status: Secondary | ICD-10-CM | POA: Insufficient documentation

## 2015-05-21 DIAGNOSIS — Z90710 Acquired absence of both cervix and uterus: Secondary | ICD-10-CM | POA: Insufficient documentation

## 2015-05-21 DIAGNOSIS — F909 Attention-deficit hyperactivity disorder, unspecified type: Secondary | ICD-10-CM | POA: Insufficient documentation

## 2015-05-21 DIAGNOSIS — R52 Pain, unspecified: Secondary | ICD-10-CM

## 2015-05-21 DIAGNOSIS — Z79899 Other long term (current) drug therapy: Secondary | ICD-10-CM | POA: Insufficient documentation

## 2015-05-21 DIAGNOSIS — G43909 Migraine, unspecified, not intractable, without status migrainosus: Secondary | ICD-10-CM | POA: Insufficient documentation

## 2015-05-21 DIAGNOSIS — Z8541 Personal history of malignant neoplasm of cervix uteri: Secondary | ICD-10-CM | POA: Insufficient documentation

## 2015-05-21 DIAGNOSIS — E559 Vitamin D deficiency, unspecified: Secondary | ICD-10-CM | POA: Insufficient documentation

## 2015-05-21 DIAGNOSIS — N200 Calculus of kidney: Secondary | ICD-10-CM | POA: Insufficient documentation

## 2015-05-21 LAB — CBC WITH DIFFERENTIAL/PLATELET
BASOS ABS: 0.1 10*3/uL (ref 0.0–0.1)
Basophils Relative: 1 %
EOS ABS: 0.2 10*3/uL (ref 0.0–0.7)
EOS PCT: 2 %
HCT: 41.2 % (ref 36.0–46.0)
Hemoglobin: 13.5 g/dL (ref 12.0–15.0)
LYMPHS ABS: 1.7 10*3/uL (ref 0.7–4.0)
Lymphocytes Relative: 19 %
MCH: 28 pg (ref 26.0–34.0)
MCHC: 32.8 g/dL (ref 30.0–36.0)
MCV: 85.3 fL (ref 78.0–100.0)
Monocytes Absolute: 0.4 10*3/uL (ref 0.1–1.0)
Monocytes Relative: 5 %
Neutro Abs: 6.7 10*3/uL (ref 1.7–7.7)
Neutrophils Relative %: 73 %
PLATELETS: 393 10*3/uL (ref 150–400)
RBC: 4.83 MIL/uL (ref 3.87–5.11)
RDW: 13.2 % (ref 11.5–15.5)
WBC: 9.1 10*3/uL (ref 4.0–10.5)

## 2015-05-21 LAB — I-STAT CHEM 8, ED
BUN: 13 mg/dL (ref 6–20)
Calcium, Ion: 1.15 mmol/L (ref 1.12–1.23)
Chloride: 101 mmol/L (ref 101–111)
Creatinine, Ser: 0.8 mg/dL (ref 0.44–1.00)
GLUCOSE: 127 mg/dL — AB (ref 65–99)
HCT: 43 % (ref 36.0–46.0)
HEMOGLOBIN: 14.6 g/dL (ref 12.0–15.0)
Potassium: 4 mmol/L (ref 3.5–5.1)
Sodium: 139 mmol/L (ref 135–145)
TCO2: 26 mmol/L (ref 0–100)

## 2015-05-21 LAB — URINE MICROSCOPIC-ADD ON

## 2015-05-21 LAB — URINALYSIS, ROUTINE W REFLEX MICROSCOPIC
Bilirubin Urine: NEGATIVE
GLUCOSE, UA: NEGATIVE mg/dL
KETONES UR: NEGATIVE mg/dL
Nitrite: NEGATIVE
PROTEIN: NEGATIVE mg/dL
Specific Gravity, Urine: 1.012 (ref 1.005–1.030)
pH: 6.5 (ref 5.0–8.0)

## 2015-05-21 MED ORDER — ONDANSETRON 8 MG PO TBDP: ORAL_TABLET | ORAL | Status: DC

## 2015-05-21 MED ORDER — TAMSULOSIN HCL 0.4 MG PO CAPS
0.4000 mg | ORAL_CAPSULE | Freq: Every day | ORAL | Status: DC
  Administered 2015-05-21: 0.4 mg via ORAL
  Filled 2015-05-21: qty 1

## 2015-05-21 MED ORDER — ONDANSETRON HCL 4 MG/2ML IJ SOLN
4.0000 mg | Freq: Once | INTRAMUSCULAR | Status: AC
  Administered 2015-05-21: 4 mg via INTRAVENOUS
  Filled 2015-05-21: qty 2

## 2015-05-21 MED ORDER — MORPHINE SULFATE (PF) 4 MG/ML IV SOLN
4.0000 mg | Freq: Once | INTRAVENOUS | Status: AC
  Administered 2015-05-21: 4 mg via INTRAVENOUS
  Filled 2015-05-21: qty 1

## 2015-05-21 MED ORDER — KETOROLAC TROMETHAMINE 30 MG/ML IJ SOLN
30.0000 mg | Freq: Once | INTRAMUSCULAR | Status: AC
  Administered 2015-05-21: 30 mg via INTRAVENOUS
  Filled 2015-05-21: qty 1

## 2015-05-21 MED ORDER — TAMSULOSIN HCL 0.4 MG PO CAPS: 0.4000 mg | ORAL_CAPSULE | Freq: Every day | ORAL | Status: DC

## 2015-05-21 MED ORDER — IBUPROFEN 800 MG PO TABS: 800.0000 mg | ORAL_TABLET | Freq: Three times a day (TID) | ORAL | Status: DC

## 2015-05-21 NOTE — ED Provider Notes (Signed)
CSN: [id number]     Arrival date & time 05/21/15  [id number] History  By signing my name below, I, [name] Sherman, attest that this documentation has been prepared under the direction and in the presence of Araeya Lamb, MD. Electronically Signed: Soijett Sherman, ED Scribe. 05/21/2015. 4:01 AM.   Chief Complaint  Patient presents with  . Flank Pain      Patient is a 54 y.o. female presenting with flank pain. The history is provided by the patient. No language interpreter was used.  Flank Pain This is a recurrent problem. The current episode started 1 to 2 hours ago. The problem occurs constantly. The problem has not changed since onset.Pertinent negatives include no chest pain. Nothing aggravates the symptoms. Nothing relieves the symptoms. Treatments tried: oxycodone. The treatment provided no relief.    HPI Comments: [name] Sherman is a 55 y.o. female with a medical hx of nephrolithiasis who presents to the Emergency Department complaining of intense right sided flank pain onset tonight. She reports that she has been seen for her symptoms on 05/18/15 and she had a Renal US that showed stones. She was referred to Dr. Junious Silk who she saw 2 days ago and was Rx oxycodone, flomax, and zofran with no relief of her symptoms. She states that she will have a procedure on her left kidney on 05/26/15 and she has a stent to the kidney. She notes that her pain is so intense that it caused her to vomit and she didn't take the zofran PTA. She last took the oxycodone at 2:30 PM She states that she is having associated symptoms of nausea, vomiting x 1, diarrhea, and dysuria. She denies constipation, and any other symptoms. Denies allergies to any medications.   Past Medical History  Diagnosis Date  . Migraine   . Vitamin D deficiency   . Hyperlipidemia   . Umbilical hernia without obstruction or gangrene   . Urgency of urination   . Left ureteral stone   . Nephrolithiasis     bilateral nonobstructive per ct   . History of cervical cancer     s/p  laser ablation of cervix 1980's  . OSA (obstructive sleep apnea)     per pt used cpap up until 2013 states lost wt and did not need anymore  . ADD (attention deficit disorder)    Past Surgical History  Procedure Laterality Date  . Bunionectomy Right 2004  . Cystoscopy with retrograde pyelogram, ureteroscopy and stent placement Left 05/01/2015    Procedure: CYSTOSCOPY WITH RETROGRADE PYELOGRAM AND STENT PLACEMENT;  Surgeon: Festus Aloe, MD;  Location: [facility];  Service: Urology;  Laterality: Left;  . Laser ablation of the cervix  x2  1980's  . Tubal ligation  1980's  . Laparoscopic assisted vaginal hysterectomy  04-23-2002    and Left Salpingoophorectomy/  Anterior Repair/  Tension Free Tape Sling placement   Family History  Problem Relation Age of Onset  . Alcohol abuse Mother   . Arthritis Father   . Hyperlipidemia Father   . Heart disease Father   . Hypertension Father   . Kidney disease Paternal Uncle   . Cancer Maternal Grandmother     colon  . Cancer Paternal Grandmother     breast   Social History  Substance Use Topics  . Smoking status: Never Smoker   . Smokeless tobacco: Never Used  . Alcohol Use: No     Comment: 1 x year   OB History    No data  available     Review of Systems  Cardiovascular: Negative for chest pain.  Gastrointestinal: Positive for nausea, vomiting and diarrhea. Negative for constipation.  Genitourinary: Positive for dysuria, hematuria and flank pain.  All other systems reviewed and are negative.   A complete 10 system review of systems was obtained and all systems are negative except as noted in the HPI and PMH.   Allergies  Review of patient's allergies indicates no known allergies.  Home Medications   Prior to Admission medications   Medication Sig Start Date End Date Taking? Authorizing Provider  amphetamine-dextroamphetamine (ADDERALL) 20 MG tablet Take 1 tablet (20 mg total) by mouth 2  (two) times daily. Patient taking differently: Take 20 mg by mouth 2 (two) times daily. Works 3rd shift -- takes 2300  And 1500 04/03/15   [name], MD  atorvastatin (LIPITOR) 10 MG tablet Take 1 tablet (10 mg total) by mouth daily. Patient taking differently: Take 10 mg by mouth daily at 6 PM.  03/14/14   [name], MD  citalopram (CELEXA) 20 MG tablet Take 1 tablet (20 mg total) by mouth daily. Taking 3 tabs at HS Patient taking differently: Take 20 mg by mouth at bedtime. Taking 3 tabs at [facility] 03/14/14   [name], MD  ibuprofen (ADVIL,MOTRIN) 200 MG tablet Take 600 mg by mouth every 6 (six) hours as needed for moderate pain.    Historical Provider, MD  Multiple Vitamin (MULTIVITAMIN WITH MINERALS) TABS tablet Take 1 tablet by mouth daily.    Historical Provider, MD  ondansetron (ZOFRAN) 8 MG tablet Take 1 tablet (8 mg total) by mouth every 8 (eight) hours as needed for nausea or vomiting. 05/05/15   [name], MD  oxyCODONE-acetaminophen (ROXICET) 5-325 MG tablet Take 1 tablet by mouth every 8 (eight) hours as needed for severe pain. 05/03/15   Oswald Hillock, MD  phenazopyridine (PYRIDIUM) 200 MG tablet Take 1 tablet (200 mg total) by mouth 3 (three) times daily as needed for pain. 05/11/15   [name], MD  solifenacin (VESICARE) 10 MG tablet Take 10 mg by mouth every morning.    Historical Provider, MD  SUMAtriptan (IMITREX) 100 MG tablet Take 100 mg by mouth every 2 (two) hours as needed for migraine. May repeat in 2 hours if headache persists or recurs.    Historical Provider, MD  tamsulosin (FLOMAX) 0.4 MG CAPS capsule Take 0.4 mg by mouth daily after breakfast.    Historical Provider, MD  Vitamin D, Ergocalciferol, (DRISDOL) 50000 UNITS CAPS capsule TAKE ONE CAPSULE BY MOUTH ONCE A WEEK 04/23/15   [name], MD   BP 143/71 mmHg  Pulse 70  Temp(Src) 97.9 F (36.6 C) (Oral)  Resp 18  SpO2 100% Physical Exam  Constitutional: She is oriented to  person, place, and time. She appears well-developed and well-nourished. No distress.  HENT:  Head: Normocephalic and atraumatic.  Mouth/Throat: Oropharynx is clear and moist and mucous membranes are normal.  Eyes: EOM are normal. Pupils are equal, round, and reactive to light.  Neck: Trachea normal and normal range of motion. Neck supple.  Cardiovascular: Normal rate, regular rhythm and normal heart sounds.  Exam reveals no gallop and no friction rub.   No murmur heard. Pulmonary/Chest: Effort normal and breath sounds normal. No stridor. No respiratory distress. She has no wheezes. She has no rales.  Abdominal: Soft. Bowel sounds are increased. There is no tenderness. There is no rebound and no guarding.  Musculoskeletal: Normal range of motion.  Neurological: She is alert and oriented to person, place, and time.  Skin: Skin is warm and dry.  Psychiatric: She has a normal mood and affect. Her behavior is normal.  Nursing note and vitals reviewed.   ED Course  Procedures (including critical care time) DIAGNOSTIC STUDIES: Oxygen Saturation is 100% on RA, nl by my interpretation.    COORDINATION OF CARE: 3:58 AM Discussed treatment plan with pt at bedside and pt agreed to plan.    Labs Review Labs Reviewed - No data to display  Imaging Review No results found. I have personally reviewed and evaluated these images and lab results as part of my medical decision-making.   EKG Interpretation None      MDM   Final diagnoses:  None   Results for orders placed or performed during the hospital encounter of 05/21/15  CBC with Differential/Platelet  Result Value Ref Range   WBC 9.1 4.0 - 10.5 K/uL   RBC 4.83 3.87 - 5.11 MIL/uL   Hemoglobin 13.5 12.0 - 15.0 g/dL   HCT 41.2 36.0 - 46.0 %   MCV 85.3 78.0 - 100.0 fL   MCH 28.0 26.0 - 34.0 pg   MCHC 32.8 30.0 - 36.0 g/dL   RDW 13.2 11.5 - 15.5 %   Platelets 393 150 - 400 K/uL   Neutrophils Relative % 73 %   Neutro Abs 6.7 1.7 -  7.7 K/uL   Lymphocytes Relative 19 %   Lymphs Abs 1.7 0.7 - 4.0 K/uL   Monocytes Relative 5 %   Monocytes Absolute 0.4 0.1 - 1.0 K/uL   Eosinophils Relative 2 %   Eosinophils Absolute 0.2 0.0 - 0.7 K/uL   Basophils Relative 1 %   Basophils Absolute 0.1 0.0 - 0.1 K/uL  Urinalysis, Routine w reflex microscopic (not at [facility])  Result Value Ref Range   Color, Urine YELLOW YELLOW   APPearance CLOUDY (A) CLEAR   Specific Gravity, Urine 1.012 1.005 - 1.030   pH 6.5 5.0 - 8.0   Glucose, UA NEGATIVE NEGATIVE mg/dL   Hgb urine dipstick LARGE (A) NEGATIVE   Bilirubin Urine NEGATIVE NEGATIVE   Ketones, ur NEGATIVE NEGATIVE mg/dL   Protein, ur NEGATIVE NEGATIVE mg/dL   Nitrite NEGATIVE NEGATIVE   Leukocytes, UA TRACE (A) NEGATIVE  Urine microscopic-add on  Result Value Ref Range   Squamous Epithelial / LPF 0-5 (A) NONE SEEN   WBC, UA 0-5 0 - 5 WBC/hpf   RBC / HPF TOO NUMEROUS TO COUNT 0 - 5 RBC/hpf   Bacteria, UA RARE (A) NONE SEEN  I-stat chem 8, ed  Result Value Ref Range   Sodium 139 135 - 145 mmol/L   Potassium 4.0 3.5 - 5.1 mmol/L   Chloride 101 101 - 111 mmol/L   BUN 13 6 - 20 mg/dL   Creatinine, Ser 0.80 0.44 - 1.00 mg/dL   Glucose, Bld 127 (H) 65 - 99 mg/dL   Calcium, Ion 1.15 1.12 - 1.23 mmol/L   TCO2 26 0 - 100 mmol/L   Hemoglobin 14.6 12.0 - 15.0 g/dL   HCT 43.0 36.0 - 46.0 %   Ct Abdomen Pelvis W Contrast  05/01/2015  CLINICAL DATA:  Upper abdominal pain for 2 days.  Nausea. EXAM: CT ABDOMEN AND PELVIS WITH CONTRAST TECHNIQUE: Multidetector CT imaging of the abdomen and pelvis was performed using the standard protocol following bolus administration of intravenous contrast. CONTRAST:  150mL OMNIPAQUE IOHEXOL 300 MG/ML  SOLN COMPARISON:  12/10/2012 FINDINGS: There is a 5 mm calculus at the left ureteropelvic junction with moderate hydronephrosis and perinephric stranding. Additional collecting system calculi are present bilaterally measuring up to 6 x 8 mm. No other acute  findings are evident in the abdomen or pelvis. There are normal appearances of the liver, spleen, pancreas and adrenals. There are normal appearances of the stomach, small bowel and colon. The appendix is normal. Insert basket there is hysterectomy. No adnexal abnormalities are evident. No significant skeletal lesions are evident. There is moderate degenerative disc disease, greatest at L5-S1. There is a mid midline supraumbilical ventral hernia containing omental fat. There is a fat-containing umbilical hernia. There is no significant abnormality in the lower chest. IMPRESSION: 5 mm left UPJ calculus with moderate hydronephrosis. Bilateral nephrolithiasis Ventral hernia containing omentum, supraumbilical Fat containing umbilical hernia Electronically Signed   By: Andreas Newport M.D.   On: 05/01/2015 05:39   US Renal  05/18/2015  CLINICAL DATA:  54 year old current history of urinary tract calculi, left double-J ureteral stent placed 05/01/2015, acute onset of left flank pain 3 days ago with increased intensity today. Patient scheduled for lithotripsy in 2 days. EXAM: RENAL / URINARY TRACT ULTRASOUND COMPLETE COMPARISON:  CT abdomen and pelvis 05/01/2015, 12/10/2012. Complete abdominal ultrasound 04/21/2011. FINDINGS: Right Kidney: Length: Approximately 11.0 cm. No hydronephrosis. Well-preserved cortex. Normal parenchymal echotexture. Approximate 6 mm calculus and 3 mm calculus in mid calices as noted on recent CT. No focal parenchymal abnormality. Left Kidney: Length: Approximately 12.0 cm. No hydronephrosis. Well-preserved cortex. Normal parenchymal echotexture. Approximate 8 mm calculus in a lower pole calix as noted on recent CT. No focal parenchymal abnormality. Bladder: Decompressed. Right ureteral jet visualized a color Doppler evaluation. Left ureteral jet not visualized. IMPRESSION: 1. No evidence of hydronephrosis involving either kidney. 2. Bilateral renal calculi as noted on the recent CT.  Electronically Signed   By: Evangeline Dakin M.D.   On: 05/18/2015 20:53   Ct Renal Stone Study  05/21/2015  CLINICAL DATA:  Acute onset of right flank pain, nausea, vomiting and diarrhea. Dysuria. Initial encounter. EXAM: CT ABDOMEN AND PELVIS WITHOUT CONTRAST TECHNIQUE: Multidetector CT imaging of the abdomen and pelvis was performed following the standard protocol without IV contrast. COMPARISON:  CT of the abdomen and pelvis from 05/01/2015, and renal ultrasound performed 05/18/2015 FINDINGS: The visualized lung bases are clear. The liver and spleen are unremarkable in appearance. The gallbladder is within normal limits. The pancreas and adrenal glands are unremarkable. There is minimal right-sided hydronephrosis, with an obstructing 5 x 4 mm stone noted proximally at the right ureteropelvic junction. Nonobstructing left-sided renal stones measure up to 8 mm in size. A left-sided ureteral stent is noted in expected position. The left kidney is otherwise unremarkable. No significant perinephric stranding is seen. No free fluid is identified. The small bowel is unremarkable in appearance. The stomach is within normal limits. No acute vascular abnormalities are seen. A small to moderate anterior abdominal wall hernia is noted above the umbilicus, containing only fat. A small umbilical hernia is also seen, containing only fat. The appendix is normal in caliber, without evidence of appendicitis. Minimal diverticulosis is noted along the descending colon, without evidence of diverticulitis. The bladder is mildly distended and grossly unremarkable in appearance. The patient is status post hysterectomy. No suspicious adnexal masses are seen. No inguinal lymphadenopathy is seen. No acute osseous abnormalities are identified. Vacuum phenomenon and disc space narrowing are noted at L5-S1. IMPRESSION: 1. Minimal right-sided hydronephrosis, with an obstructing  5 x 4 mm stone noted proximally at the right ureteropelvic  junction. 2. Nonobstructing left-sided renal stones measure up to 8 mm in size. Left-sided ureteral stent noted in expected position. 3. Small to moderate anterior abdominal wall hernia above the umbilicus, containing only fat. Small umbilical hernia also seen, containing only fat. 4. Minimal diverticulosis along the descending colon, without evidence of diverticulitis. Electronically Signed   By: Garald Balding M.D.   On: 05/21/2015 05:47    Medications  tamsulosin (FLOMAX) capsule 0.4 mg (0.4 mg Oral Given 05/21/15 0610)  ketorolac (TORADOL) 30 MG/ML injection 30 mg (30 mg Intravenous Given 05/21/15 0427)  ondansetron (ZOFRAN) injection 4 mg (4 mg Intravenous Given 05/21/15 0426)  morphine 4 MG/ML injection 4 mg (4 mg Intravenous Given 05/21/15 0611)   Follow up with Dr. Junious Silk will start NSAIDs and flomax.  Strict return precautions given    I personally performed the services described in this documentation, which was scribed in my presence. The recorded information has been reviewed and is accurate.      Veatrice Kells, MD 05/21/15 [phone]

## 2015-05-21 NOTE — ED Notes (Signed)
Pt states that she was awakened from her sleep with rt sided flank pain; pt states that the pain was very intense and made her nauseous; pt reports vomiting x 1; pt states that she has a hx of left sided kidney stones but not on the right; pt states that she felt short of breath when the pain hit

## 2015-05-26 ENCOUNTER — Encounter (HOSPITAL_BASED_OUTPATIENT_CLINIC_OR_DEPARTMENT_OTHER): Payer: Self-pay | Admitting: *Deleted

## 2015-05-26 ENCOUNTER — Encounter (HOSPITAL_COMMUNITY)
Admission: RE | Disposition: A | Discharge status: Home / Self Care | Payer: Self-pay | Source: Ambulatory Visit | Attending: Urology

## 2015-05-26 ENCOUNTER — Observation Stay (HOSPITAL_BASED_OUTPATIENT_CLINIC_OR_DEPARTMENT_OTHER)
Admission: RE | Admit: 2015-05-26 | Discharge: 2015-05-27 | Disposition: A | Discharge status: Home / Self Care | Payer: Self-pay | Source: Ambulatory Visit | Attending: Urology | Admitting: Urology

## 2015-05-26 ENCOUNTER — Ambulatory Visit (HOSPITAL_BASED_OUTPATIENT_CLINIC_OR_DEPARTMENT_OTHER): Payer: Self-pay | Admitting: Anesthesiology

## 2015-05-26 DIAGNOSIS — N135 Crossing vessel and stricture of ureter without hydronephrosis: Secondary | ICD-10-CM

## 2015-05-26 DIAGNOSIS — E669 Obesity, unspecified: Secondary | ICD-10-CM | POA: Insufficient documentation

## 2015-05-26 DIAGNOSIS — E7800 Pure hypercholesterolemia, unspecified: Secondary | ICD-10-CM | POA: Insufficient documentation

## 2015-05-26 DIAGNOSIS — Z79891 Long term (current) use of opiate analgesic: Secondary | ICD-10-CM | POA: Insufficient documentation

## 2015-05-26 DIAGNOSIS — G4733 Obstructive sleep apnea (adult) (pediatric): Secondary | ICD-10-CM | POA: Insufficient documentation

## 2015-05-26 DIAGNOSIS — N202 Calculus of kidney with calculus of ureter: Principal | ICD-10-CM | POA: Insufficient documentation

## 2015-05-26 DIAGNOSIS — Z79899 Other long term (current) drug therapy: Secondary | ICD-10-CM | POA: Insufficient documentation

## 2015-05-26 DIAGNOSIS — Z6834 Body mass index (BMI) 34.0-34.9, adult: Secondary | ICD-10-CM | POA: Insufficient documentation

## 2015-05-26 HISTORY — PX: HOLMIUM LASER APPLICATION: SHX5852

## 2015-05-26 HISTORY — PX: CYSTOSCOPY W/ RETROGRADES: SHX1426

## 2015-05-26 HISTORY — PX: CYSTOSCOPY WITH STENT PLACEMENT: SHX5790

## 2015-05-26 HISTORY — DX: Urgency of urination: R39.15

## 2015-05-26 HISTORY — DX: Calculus of ureter: N20.1

## 2015-05-26 HISTORY — DX: Personal history of malignant neoplasm of cervix uteri: Z85.41

## 2015-05-26 HISTORY — DX: Other specified behavioral and emotional disorders with onset usually occurring in childhood and adolescence: F98.8

## 2015-05-26 HISTORY — PX: CYSTOSCOPY W/ URETERAL STENT REMOVAL: SHX1430

## 2015-05-26 HISTORY — DX: Obstructive sleep apnea (adult) (pediatric): G47.33

## 2015-05-26 HISTORY — PX: CYSTOSCOPY WITH URETEROSCOPY: SHX5123

## 2015-05-26 HISTORY — DX: Umbilical hernia without obstruction or gangrene: K42.9

## 2015-05-26 HISTORY — DX: Hyperlipidemia, unspecified: E78.5

## 2015-05-26 SURGERY — CYSTOSCOPY, WITH STENT INSERTION
Anesthesia: General | Site: Renal | Laterality: Left

## 2015-05-26 MED ORDER — KETOROLAC TROMETHAMINE 30 MG/ML IJ SOLN
INTRAMUSCULAR | Status: AC
  Filled 2015-05-26: qty 1

## 2015-05-26 MED ORDER — PROMETHAZINE HCL 25 MG/ML IJ SOLN
INTRAMUSCULAR | Status: AC
  Filled 2015-05-26: qty 1

## 2015-05-26 MED ORDER — BELLADONNA ALKALOIDS-OPIUM 16.2-60 MG RE SUPP
RECTAL | Status: AC
  Filled 2015-05-26: qty 1

## 2015-05-26 MED ORDER — OXYCODONE HCL 5 MG PO TABS
5.0000 mg | ORAL_TABLET | Freq: Once | ORAL | Status: AC | PRN
  Administered 2015-05-26: 5 mg via ORAL
  Filled 2015-05-26: qty 1

## 2015-05-26 MED ORDER — LEVOFLOXACIN IN D5W 500 MG/100ML IV SOLN
INTRAVENOUS | Status: AC
  Filled 2015-05-26: qty 100

## 2015-05-26 MED ORDER — OXYCODONE-ACETAMINOPHEN 5-325 MG PO TABS: 1.0000 | ORAL_TABLET | ORAL | Status: DC | PRN

## 2015-05-26 MED ORDER — IOHEXOL 350 MG/ML SOLN
INTRAVENOUS | Status: DC | PRN
  Administered 2015-05-26: 14 mL

## 2015-05-26 MED ORDER — LIDOCAINE HCL (CARDIAC) 20 MG/ML IV SOLN
INTRAVENOUS | Status: AC
  Filled 2015-05-26: qty 5

## 2015-05-26 MED ORDER — SUMATRIPTAN SUCCINATE 100 MG PO TABS: 100.0000 mg | ORAL_TABLET | ORAL | Status: DC | PRN

## 2015-05-26 MED ORDER — PROPOFOL 500 MG/50ML IV EMUL
INTRAVENOUS | Status: DC | PRN
  Administered 2015-05-26: 200 mL via INTRAVENOUS

## 2015-05-26 MED ORDER — POLYETHYLENE GLYCOL 3350 17 G PO PACK
17.0000 g | PACK | Freq: Every day | ORAL | Status: DC | PRN
  Filled 2015-05-26: qty 1

## 2015-05-26 MED ORDER — LIDOCAINE HCL 2 % EX GEL
CUTANEOUS | Status: DC | PRN
Start: 2015-05-26 — End: 2015-05-26
  Administered 2015-05-26: 1 via URETHRAL

## 2015-05-26 MED ORDER — CEFAZOLIN SODIUM 1-5 GM-% IV SOLN
1.0000 g | INTRAVENOUS | Status: DC
  Filled 2015-05-26: qty 50

## 2015-05-26 MED ORDER — HYDROMORPHONE HCL 1 MG/ML IJ SOLN
INTRAMUSCULAR | Status: AC
  Filled 2015-05-26: qty 1

## 2015-05-26 MED ORDER — DARIFENACIN HYDROBROMIDE ER 15 MG PO TB24
15.0000 mg | ORAL_TABLET | Freq: Every day | ORAL | Status: DC
Start: 2015-05-26 — End: 2015-05-27
  Administered 2015-05-26 – 2015-05-27 (×2): 15 mg via ORAL
  Filled 2015-05-26 (×2): qty 1

## 2015-05-26 MED ORDER — PHENAZOPYRIDINE HCL 100 MG PO TABS
ORAL_TABLET | ORAL | Status: AC
  Filled 2015-05-26: qty 2

## 2015-05-26 MED ORDER — PROMETHAZINE HCL 25 MG/ML IJ SOLN
12.5000 mg | INTRAMUSCULAR | Status: DC | PRN
  Administered 2015-05-26: 12.5 mg via INTRAVENOUS
  Filled 2015-05-26: qty 1

## 2015-05-26 MED ORDER — DEXAMETHASONE SODIUM PHOSPHATE 10 MG/ML IJ SOLN
INTRAMUSCULAR | Status: AC
  Filled 2015-05-26: qty 1

## 2015-05-26 MED ORDER — ONDANSETRON HCL 4 MG/2ML IJ SOLN
4.0000 mg | Freq: Four times a day (QID) | INTRAMUSCULAR | Status: AC | PRN
  Administered 2015-05-26: 4 mg via INTRAVENOUS
  Filled 2015-05-26: qty 2

## 2015-05-26 MED ORDER — ONDANSETRON HCL 4 MG/2ML IJ SOLN
INTRAMUSCULAR | Status: AC
  Filled 2015-05-26: qty 2

## 2015-05-26 MED ORDER — FENTANYL CITRATE (PF) 100 MCG/2ML IJ SOLN
INTRAMUSCULAR | Status: DC | PRN
  Administered 2015-05-26 (×2): 50 ug via INTRAVENOUS

## 2015-05-26 MED ORDER — ONDANSETRON HCL 4 MG/2ML IJ SOLN
INTRAMUSCULAR | Status: DC | PRN
  Administered 2015-05-26: 4 mg via INTRAVENOUS

## 2015-05-26 MED ORDER — ATORVASTATIN CALCIUM 10 MG PO TABS
10.0000 mg | ORAL_TABLET | Freq: Every day | ORAL | Status: DC
  Filled 2015-05-26: qty 1

## 2015-05-26 MED ORDER — OXYCODONE HCL 5 MG/5ML PO SOLN
5.0000 mg | Freq: Once | ORAL | Status: AC | PRN
  Filled 2015-05-26: qty 5

## 2015-05-26 MED ORDER — PHENAZOPYRIDINE HCL 200 MG PO TABS
200.0000 mg | ORAL_TABLET | Freq: Once | ORAL | Status: AC
  Administered 2015-05-26: 200 mg via ORAL
  Filled 2015-05-26: qty 1

## 2015-05-26 MED ORDER — KETOROLAC TROMETHAMINE 30 MG/ML IJ SOLN
30.0000 mg | Freq: Once | INTRAMUSCULAR | Status: AC
  Administered 2015-05-26: 30 mg via INTRAVENOUS
  Filled 2015-05-26: qty 1

## 2015-05-26 MED ORDER — OXYBUTYNIN CHLORIDE ER 10 MG PO TB24
10.0000 mg | ORAL_TABLET | Freq: Every day | ORAL | Status: DC
  Filled 2015-05-26: qty 1

## 2015-05-26 MED ORDER — PROPOFOL 10 MG/ML IV BOLUS
INTRAVENOUS | Status: AC
  Filled 2015-05-26: qty 40

## 2015-05-26 MED ORDER — OXYBUTYNIN CHLORIDE ER 10 MG PO TB24
10.0000 mg | ORAL_TABLET | Freq: Every day | ORAL | Status: DC
  Administered 2015-05-26 (×2): 10 mg via ORAL
  Filled 2015-05-26 (×2): qty 1

## 2015-05-26 MED ORDER — HYDROMORPHONE HCL 1 MG/ML IJ SOLN
0.5000 mg | INTRAMUSCULAR | Status: DC | PRN
  Administered 2015-05-26 – 2015-05-27 (×4): 1 mg via INTRAVENOUS
  Filled 2015-05-26 (×5): qty 1

## 2015-05-26 MED ORDER — MIDAZOLAM HCL 2 MG/2ML IJ SOLN
INTRAMUSCULAR | Status: AC
  Filled 2015-05-26: qty 2

## 2015-05-26 MED ORDER — ONDANSETRON HCL 4 MG/2ML IJ SOLN
4.0000 mg | INTRAMUSCULAR | Status: DC | PRN
  Administered 2015-05-26 – 2015-05-27 (×3): 4 mg via INTRAVENOUS
  Filled 2015-05-26 (×3): qty 2

## 2015-05-26 MED ORDER — MIDAZOLAM HCL 5 MG/5ML IJ SOLN
INTRAMUSCULAR | Status: DC | PRN
  Administered 2015-05-26: 2 mg via INTRAVENOUS

## 2015-05-26 MED ORDER — SODIUM CHLORIDE 0.9 % IR SOLN
Status: DC | PRN
  Administered 2015-05-26: 4000 mL

## 2015-05-26 MED ORDER — GLYCOPYRROLATE 0.2 MG/ML IJ SOLN
INTRAMUSCULAR | Status: AC
  Filled 2015-05-26: qty 1

## 2015-05-26 MED ORDER — BELLADONNA ALKALOIDS-OPIUM 16.2-60 MG RE SUPP
1.0000 | Freq: Three times a day (TID) | RECTAL | Status: DC
  Administered 2015-05-27 (×2): 1 via RECTAL
  Filled 2015-05-26 (×2): qty 1

## 2015-05-26 MED ORDER — FENTANYL CITRATE (PF) 100 MCG/2ML IJ SOLN
100.0000 ug | Freq: Once | INTRAMUSCULAR | Status: AC
  Administered 2015-05-26 (×2): 25 ug via INTRAVENOUS
  Administered 2015-05-26: 100 ug via INTRAVENOUS
  Administered 2015-05-26: 25 ug via INTRAVENOUS
  Filled 2015-05-26: qty 2

## 2015-05-26 MED ORDER — OXYBUTYNIN CHLORIDE ER 10 MG PO TB24
ORAL_TABLET | ORAL | Status: AC
  Filled 2015-05-26: qty 1

## 2015-05-26 MED ORDER — ONDANSETRON HCL 4 MG/2ML IJ SOLN
4.0000 mg | Freq: Once | INTRAMUSCULAR | Status: AC
  Administered 2015-05-26: 4 mg via INTRAVENOUS
  Filled 2015-05-26: qty 2

## 2015-05-26 MED ORDER — BELLADONNA ALKALOIDS-OPIUM 16.2-60 MG RE SUPP
1.0000 | Freq: Once | RECTAL | Status: AC
  Administered 2015-05-26: 1 via RECTAL
  Filled 2015-05-26: qty 1

## 2015-05-26 MED ORDER — FENTANYL CITRATE (PF) 100 MCG/2ML IJ SOLN
INTRAMUSCULAR | Status: AC
  Filled 2015-05-26: qty 2

## 2015-05-26 MED ORDER — OXYBUTYNIN CHLORIDE 5 MG PO TABS
ORAL_TABLET | ORAL | Status: AC
  Filled 2015-05-26: qty 1

## 2015-05-26 MED ORDER — CITALOPRAM HYDROBROMIDE 40 MG PO TABS
60.0000 mg | ORAL_TABLET | Freq: Every day | ORAL | Status: DC
  Filled 2015-05-26 (×2): qty 1

## 2015-05-26 MED ORDER — DEXAMETHASONE SODIUM PHOSPHATE 10 MG/ML IJ SOLN
INTRAMUSCULAR | Status: DC | PRN
  Administered 2015-05-26: 10 mg via INTRAVENOUS

## 2015-05-26 MED ORDER — LEVOFLOXACIN IN D5W 500 MG/100ML IV SOLN
500.0000 mg | Freq: Once | INTRAVENOUS | Status: AC
  Administered 2015-05-26: 500 mg via INTRAVENOUS
  Filled 2015-05-26: qty 100

## 2015-05-26 MED ORDER — LIDOCAINE HCL (CARDIAC) 20 MG/ML IV SOLN
INTRAVENOUS | Status: DC | PRN
  Administered 2015-05-26: 100 mg via INTRAVENOUS

## 2015-05-26 MED ORDER — LACTATED RINGERS IV SOLN
INTRAVENOUS | Status: DC
  Administered 2015-05-26 (×2): via INTRAVENOUS
  Filled 2015-05-26: qty 1000

## 2015-05-26 MED ORDER — KETOROLAC TROMETHAMINE 15 MG/ML IJ SOLN
15.0000 mg | Freq: Four times a day (QID) | INTRAMUSCULAR | Status: DC
  Administered 2015-05-26 – 2015-05-27 (×3): 15 mg via INTRAVENOUS
  Filled 2015-05-26 (×5): qty 1

## 2015-05-26 MED ORDER — ACETAMINOPHEN 325 MG PO TABS: 650.0000 mg | ORAL_TABLET | ORAL | Status: DC | PRN

## 2015-05-26 MED ORDER — FENTANYL CITRATE (PF) 100 MCG/2ML IJ SOLN
INTRAMUSCULAR | Status: AC
  Filled 2015-05-26: qty 4

## 2015-05-26 MED ORDER — DOCUSATE SODIUM 100 MG PO CAPS
100.0000 mg | ORAL_CAPSULE | Freq: Two times a day (BID) | ORAL | Status: DC
Start: 2015-05-26 — End: 2015-05-27
  Administered 2015-05-26 – 2015-05-27 (×2): 100 mg via ORAL
  Filled 2015-05-26 (×3): qty 1

## 2015-05-26 MED ORDER — DEXTROSE IN LACTATED RINGERS 5 % IV SOLN
INTRAVENOUS | Status: DC
  Administered 2015-05-26 – 2015-05-27 (×2): via INTRAVENOUS

## 2015-05-26 MED ORDER — HYDROMORPHONE HCL 1 MG/ML IJ SOLN
1.0000 mg | INTRAMUSCULAR | Status: DC | PRN
  Administered 2015-05-26: 1 mg via INTRAVENOUS
  Filled 2015-05-26: qty 1

## 2015-05-26 MED ORDER — PHENAZOPYRIDINE HCL 200 MG PO TABS
200.0000 mg | ORAL_TABLET | Freq: Three times a day (TID) | ORAL | Status: DC | PRN
  Filled 2015-05-26: qty 1

## 2015-05-26 MED ORDER — AMPHETAMINE-DEXTROAMPHETAMINE 20 MG PO TABS
20.0000 mg | ORAL_TABLET | Freq: Two times a day (BID) | ORAL | Status: DC
  Filled 2015-05-26: qty 1

## 2015-05-26 MED ORDER — OXYCODONE HCL 5 MG PO TABS
ORAL_TABLET | ORAL | Status: AC
  Filled 2015-05-26: qty 1

## 2015-05-26 MED ORDER — LACTATED RINGERS IV BOLUS (SEPSIS)
1000.0000 mL | Freq: Once | INTRAVENOUS | Status: AC
  Administered 2015-05-26: 1000 mL via INTRAVENOUS
  Filled 2015-05-26: qty 1000

## 2015-05-26 MED ORDER — CEFAZOLIN SODIUM-DEXTROSE 2-3 GM-% IV SOLR
INTRAVENOUS | Status: AC
  Filled 2015-05-26: qty 50

## 2015-05-26 MED ORDER — HYDROMORPHONE HCL 1 MG/ML IJ SOLN
0.2500 mg | INTRAMUSCULAR | Status: DC | PRN
  Administered 2015-05-26 (×4): 0.5 mg via INTRAVENOUS
  Filled 2015-05-26: qty 1

## 2015-05-26 MED ORDER — NITROFURANTOIN MONOHYD MACRO 100 MG PO CAPS: 100.0000 mg | ORAL_CAPSULE | Freq: Every day | ORAL | Status: DC

## 2015-05-26 MED ORDER — CEFAZOLIN SODIUM-DEXTROSE 2-3 GM-% IV SOLR
2.0000 g | INTRAVENOUS | Status: AC
  Administered 2015-05-26: 2 g via INTRAVENOUS
  Filled 2015-05-26: qty 50

## 2015-05-26 MED ORDER — TAMSULOSIN HCL 0.4 MG PO CAPS
0.4000 mg | ORAL_CAPSULE | Freq: Every day | ORAL | Status: DC
  Administered 2015-05-27: 0.4 mg via ORAL
  Filled 2015-05-26: qty 1

## 2015-05-26 MED ORDER — OXYCODONE-ACETAMINOPHEN 5-325 MG PO TABS: 1.0000 | ORAL_TABLET | Freq: Four times a day (QID) | ORAL | Status: DC | PRN

## 2015-05-26 MED ORDER — LEVOFLOXACIN IN D5W 500 MG/100ML IV SOLN
500.0000 mg | INTRAVENOUS | Status: DC
  Filled 2015-05-26: qty 100

## 2015-05-26 SURGICAL SUPPLY — 46 items
ADAPTER CATH URET PLST 4-6FR (CATHETERS) IMPLANT
ADPR CATH URET STRL DISP 4-6FR (CATHETERS)
BAG DRAIN URO-CYSTO SKYTR STRL (DRAIN) ×4 IMPLANT
BAG DRN UROCATH (DRAIN) ×3
BASKET LASER NITINOL 1.9FR (BASKET) ×2 IMPLANT
BASKET STNLS GEMINI 4WIRE 3FR (BASKET) IMPLANT
BASKET ZERO TIP NITINOL 2.4FR (BASKET) IMPLANT
BSKT STON RTRVL 120 1.9FR (BASKET) ×3
BSKT STON RTRVL GEM 120X11 3FR (BASKET)
BSKT STON RTRVL ZERO TP 2.4FR (BASKET)
CANISTER SUCT LVC 12 LTR MEDI- (MISCELLANEOUS) IMPLANT
CATH INTERMIT  6FR 70CM (CATHETERS) ×2 IMPLANT
CATH URET 5FR 28IN CONE TIP (BALLOONS)
CATH URET 5FR 28IN OPEN ENDED (CATHETERS) IMPLANT
CATH URET 5FR 70CM CONE TIP (BALLOONS) IMPLANT
CATH URET DUAL LUMEN 6-10FR 50 (CATHETERS) ×2 IMPLANT
CLOTH BEACON ORANGE TIMEOUT ST (SAFETY) ×4 IMPLANT
ELECT REM PT RETURN 9FT ADLT (ELECTROSURGICAL)
ELECTRODE REM PT RTRN 9FT ADLT (ELECTROSURGICAL) IMPLANT
FIBER LASER TRAC TIP (UROLOGICAL SUPPLIES) ×2 IMPLANT
GLOVE BIO SURGEON STRL SZ 6.5 (GLOVE) ×2 IMPLANT
GLOVE BIO SURGEON STRL SZ7.5 (GLOVE) ×4 IMPLANT
GLOVE BIOGEL PI IND STRL 6.5 (GLOVE) ×2 IMPLANT
GLOVE BIOGEL PI INDICATOR 6.5 (GLOVE) ×2
GOWN STRL REUS W/ TWL LRG LVL3 (GOWN DISPOSABLE) ×3 IMPLANT
GOWN STRL REUS W/ TWL XL LVL3 (GOWN DISPOSABLE) ×3 IMPLANT
GOWN STRL REUS W/TWL LRG LVL3 (GOWN DISPOSABLE) ×4
GOWN STRL REUS W/TWL XL LVL3 (GOWN DISPOSABLE) ×4
GUIDEWIRE 0.038 PTFE COATED (WIRE) IMPLANT
GUIDEWIRE ANG ZIPWIRE 038X150 (WIRE) ×2 IMPLANT
GUIDEWIRE STR DUAL SENSOR (WIRE) ×6 IMPLANT
IV NS 1000ML (IV SOLUTION) ×4
IV NS 1000ML BAXH (IV SOLUTION) ×1 IMPLANT
IV NS IRRIG 3000ML ARTHROMATIC (IV SOLUTION) ×6 IMPLANT
KIT BALLIN UROMAX 15FX10 (LABEL) IMPLANT
KIT BALLN UROMAX 15FX4 (MISCELLANEOUS) IMPLANT
KIT BALLN UROMAX 26 75X4 (MISCELLANEOUS)
KIT ROOM TURNOVER WOR (KITS) ×4 IMPLANT
MANIFOLD NEPTUNE II (INSTRUMENTS) ×2 IMPLANT
NS IRRIG 500ML POUR BTL (IV SOLUTION) ×2 IMPLANT
PACK CYSTO (CUSTOM PROCEDURE TRAY) ×4 IMPLANT
SET HIGH PRES BAL DIL (LABEL)
SHEATH ACCESS URETERAL 38CM (SHEATH) ×2 IMPLANT
STENT PERCUFLEX 4.8FRX26 (STENTS) ×4 IMPLANT
SYRINGE IRR TOOMEY STRL 70CC (SYRINGE) IMPLANT
TUBE CONNECTING 12X1/4 (SUCTIONS) IMPLANT

## 2015-05-26 NOTE — Transfer of Care (Signed)
Immediate Anesthesia Transfer of Care Note  Patient: [name] Sherman  Procedure(s) Performed: Procedure(s): CYSTOSCOPY WITH STENT PLACEMENT (Bilateral) CYSTOSCOPY WITH STENT REMOVAL (Left) CYSTOSCOPY WITH RETROGRADE PYELOGRAM (Bilateral) CYSTOSCOPY WITH URETEROSCOPY (Bilateral) HOLMIUM LASER APPLICATION (Left)  Patient Location: PACU  Anesthesia Type:General  Level of Consciousness: awake, alert , oriented and patient cooperative  Airway & Oxygen Therapy: Patient Spontanous Breathing and Patient connected to face mask oxygen  Post-op Assessment: Report given to RN and Post -op Vital signs reviewed and stable  Post vital signs: Reviewed and stable  Last Vitals:  Filed Vitals:   05/26/15 1005  BP: 134/75  Pulse: 94  Temp: 37.2 C  Resp: 16    Complications: No apparent anesthesia complications

## 2015-05-26 NOTE — Discharge Instructions (Signed)
Ureteral Stent Implantation, Care After °Refer to this sheet in the next few weeks. These instructions provide you with information on caring for yourself after your procedure. Your health care provider may also give you more specific instructions. Your treatment has been planned according to current medical practices, but problems sometimes occur. Call your health care provider if you have any problems or questions after your procedure. °WHAT TO EXPECT AFTER THE PROCEDURE °You should be back to normal activity within 48 hours after the procedure. Nausea and vomiting may occur and are commonly the result of anesthesia. °It is common to experience sharp pain in the back or lower abdomen and penis with voiding. This is caused by movement of the ends of the stent with the act of urinating. It usually goes away within minutes after you have stopped urinating. °HOME CARE INSTRUCTIONS °Make sure to drink plenty of fluids. You may have small amounts of bleeding, causing your urine to be red. This is normal. Certain movements may trigger pain or a feeling that you need to urinate. You may be given medicines to prevent infection or bladder spasms. Be sure to take all medicines as directed. Only take over-the-counter or prescription medicines for pain, discomfort, or fever as directed by your health care provider. Do not take aspirin, as this can make bleeding worse. °Your stent will be left in until the blockage is resolved. This may take 2 weeks or longer, depending on the reason for stent implantation. You may have an X-ray exam to make sure your ureter is open and that the stent has not moved out of position (migrated). The stent can be removed by your health care provider in the office. Medicines may be given for comfort while the stent is being removed. Be sure to keep all follow-up appointments so your health care provider can check that you are healing properly. °SEEK MEDICAL CARE IF: °· You experience increasing  pain. °· Your pain medicine is not working. °SEEK IMMEDIATE MEDICAL CARE IF: °· Your urine is dark red or has blood clots. °· You are leaking urine (incontinent). °· You have a fever, chills, feeling sick to your stomach (nausea), or vomiting. °· Your pain is not relieved by pain medicine. °· The end of the stent comes out of the urethra. °· You are unable to urinate. °  °This information is not intended to replace advice given to you by your health care provider. Make sure you discuss any questions you have with your health care provider. °  °Document Released: 02/13/2013 Document Revised: 06/18/2013 Document Reviewed: 12/26/2014 °Elsevier Interactive Patient Education ©2016 [employer] ° °Post Anesthesia Home Care Instructions ° °Activity: °Get plenty of rest for the remainder of the day. A responsible adult should stay with you for 24 hours following the procedure.  °For the next 24 hours, DO NOT: °-Drive a car °-Operate machinery °-Drink alcoholic beverages °-Take any medication unless instructed by your physician °-Make any legal decisions or sign important papers. ° °Meals: °Start with liquid foods such as gelatin or soup. Progress to regular foods as tolerated. Avoid greasy, spicy, heavy foods. If nausea and/or vomiting occur, drink only clear liquids until the nausea and/or vomiting subsides. Call your physician if vomiting continues. ° °Special Instructions/Symptoms: °Your throat may feel dry or sore from the anesthesia or the breathing tube placed in your throat during surgery. If this causes discomfort, gargle with warm salt water. The discomfort should disappear within 24 hours. ° °If you had a scopolamine patch placed   behind your ear for the management of post- operative nausea and/or vomiting:  1. The medication in the patch is effective for 72 hours, after which it should be removed.  Wrap patch in a tissue and discard in the trash. Wash hands thoroughly with soap and water. 2. You may remove the  patch earlier than 72 hours if you experience unpleasant side effects which may include dry mouth, dizziness or visual disturbances. 3. Avoid touching the patch. Wash your hands with soap and water after contact with the patch.

## 2015-05-26 NOTE — Anesthesia Preprocedure Evaluation (Addendum)
Anesthesia Evaluation  Patient identified by MRN, date of birth, ID band Patient awake    Reviewed: Allergy & Precautions, NPO status , Patient's Chart, lab work & pertinent test results  Airway Mallampati: II   Neck ROM: full    Dental   Pulmonary sleep apnea ,    breath sounds clear to auscultation       Cardiovascular negative cardio ROS   Rhythm:regular Rate:Normal     Neuro/Psych  Headaches,    GI/Hepatic   Endo/Other  obese  Renal/GU Renal diseaseNephrolithiasis.     Musculoskeletal   Abdominal   Peds  Hematology   Anesthesia Other Findings   Reproductive/Obstetrics                             Anesthesia Physical Anesthesia Plan  ASA: II  Anesthesia Plan: General   Post-op Pain Management:    Induction: Intravenous  Airway Management Planned: LMA  Additional Equipment:   Intra-op Plan:   Post-operative Plan:   Informed Consent: I have reviewed the patients History and Physical, chart, labs and discussed the procedure including the risks, benefits and alternatives for the proposed anesthesia with the patient or authorized representative who has indicated his/her understanding and acceptance.     Plan Discussed with: CRNA, Anesthesiologist and Surgeon  Anesthesia Plan Comments:         Anesthesia Quick Evaluation

## 2015-05-26 NOTE — Op Note (Signed)
Preoperative diagnosis: Right proximal ureteral stone, left renal stones Postoperative diagnosis: Same  Procedure: Cystoscopy, right retrograde pyelogram, right ureteroscopy, right ureteral stent placement, Left retrograde pyelogram, left ureteroscopy with holmium laser lithotripsy, stone basket extraction left ureteral stent placement  Surgeon: Junious Silk  Anesthesia: Gen.  Indication for procedure: 54 year old with sepsis who underwent an urgent left ureteral stent for UPJ stone that was about 5 mm a few weeks ago. She also had an 8 mm left renal stone. She was brought today for definitive stone management and in the interim passed a 4 mm right proximal stone and had severe right flank pain today. Therefore she was brought for a bilateral procedure today.  Findings: On cystoscopy the urethra and the bladder appeared normal. There were no stones  in the bladder.   Right retrograde pyelogram-this outlined a single ureter single collecting system unit and possible filling defect in the right proximal ureter near the UPJ was some dilation of the renal pelvis and collecting system proximally.   Left retropyelogram pyelogram-this outlined a single ureter single collecting system unit that filling defect stricture dilation.   Right ureteroscopy-significant narrowing of the UPJ which I could not dilate with the narrow flexible ureteroscope, dual-lumen or access sheath. I did not want to balloon dilate as the stone may have been proximal to this area and I did not to push it into the mucosa.   Left ureteroscopy-very tight proximal ureter and UPJ despite prior stent. 8 mm stone located in the midpole in the UPJ stone and dropped into the lower pole. Both stones were successfully fragmented and all significant fragments removed.   Description of procedure: After consent was obtained patient brought to the operating room. After adequate anesthesia she was placed in lithotomy position and prepped and draped  in the usual sterile fashion. A timeout was performed to confirm the patient and procedure. The cystoscope was passed per urethra. I rinsed the bladder out several times. The urine was quite clear. The right ureteral orifice was visualized and cannulated with a 6 [place] open-ended catheter and retrograde injection of contrast was performed. A sensor wire was then advanced and coiled in the collecting system. The narrow semirigid ureteroscope was advanced to clear the ureter and I was able to get up into the proximal ureter without difficulty and no stones were noted. A second wire was passed under direct vision. I then tried to advance an access sheath and could not so I passed a dual-lumen exchange catheter without difficulty. The access sheath still would not advance with 2 sensor wires and therefore used a dual-lumen to exchange one of the sensor wires out for a Glidewire. Now the access sheath went adjacent to the Glidewire over one of the sensor wires. The dual-lumen ureteroscope was advanced into the proximal ureter but the very proximal ureter and UPJ were quite narrow but would not allow the stone to pass. Therefore I passed the single-channel flexible ureteroscope which is the most narrow and it to would not pass. The mucosa was buckling and I could not put too much pressure. Therefore the scope was backed out and again I backed out the access sheath and repassed the dual-lumen but it would not pass into the collecting system. I took one last look after passing the second wire again with the access sheath in the single channel flexible ureteroscope and just simply could not get proximal into the collecting system. Thought it best to leave a stent on the side as we discussed in  therefore the wire was backloaded on the cystoscope and a 4.8 x 26 French stent was advanced. The wire was removed with a good coil seen in the collecting system and a good coil in the bladder. I chose a smaller diameter and oh  percent would do the dilation passively but also limit her symptoms.   Next the left ureteral stent was grasped and removed through the urethral meatus. A sensor wire was advanced up the stent and cold in the collecting system. The dual-lumen exchange catheter was advanced into the proximal ureter and retrograde injection of contrast was performed. This showed a normal ureter and therefore a second wire was advanced. Over the second wire neck tissue was advanced without difficulty. The dual-lumen flexible ureteroscope was advanced but despite a stent being in on the left side the proximal ureter was too tight for the dual-lumen scope. Therefore the single-channel scope was passed without difficulty. The upper pole infundibulum was quite narrow but the upper pole was clear. The middle pole contained the 8 mm stone no found the UPJ stone a drop down into the lower pole. Lower pole stone was grasped with a basket and dropped into the middle pole and a calyx adjacent to the 8 mm stone. Both the stones were then fragmented 0.2 and 50 and then 0.8 and 8 to finish it off. All the pieces were sequentially removed with a few 1 mm or less pieces there that were too small to removed. Because of the narrowness of the proximal ureter and ureter in general and that she had a right ureteral stent a thought it best to leave the left ureteral stent and therefore the wire was backloaded on the cystoscope and another 4.8 x 24 cm stent was advanced. The wire was removed with a good positioning of the stent confirmed fluoroscopically and cystoscopically. The bladder was drained and the scope removed. I infiltrated the urethra was some lidocaine jelly. The patient was awakened and taken to recovery room in stable condition.   Complications: None   Blood loss: Minimal   Specimens: Stone fragments given the patient   Drains: Bilateral 4.8 [place] by 26 cm ureteral stents

## 2015-05-26 NOTE — H&P (Signed)
History of Present Illness Consultation nephrolithiasis referred by Dr. Darrick Meigs. PCP Dr.    Patient was seen in his hospital consult which underwent urgent cystoscopy with left ureteral stent placement for a 5 mm left UPJ stone and signs of sepsis. Urine culture grew 1000 mixed growth. She also has an 8 mm left lower pole stone. There was also a small stone in the right kidney.    She presented to the emergency department 2 days ago with symptoms of left flank pain. A renal ultrasound was normal. Her CBC and BMP were normal. A new urine culture was sent which has resulted in too young to read.     Today, she has continued stent pain in abd and left flank but it is improved. She's only been on opitates and pyridium.      Past Medical History Problems  1. History of CPAP (continuous positive airway pressure) dependence (Z99.89) 2. History of hypercholesterolemia (Z86.39) 3. History of Obstructive sleep apnea, adult (G47.33)  Surgical History Problems  1. History of Cystoscopy With Insertion Of Ureteral Stent Left  Current Meds 1. Atorvastatin Calcium 10 MG Oral Tablet;  Therapy: (Recorded:23Nov2016) to Recorded 2. Multi-Vitamin Oral Tablet;  Therapy: (Recorded:23Nov2016) to Recorded 3. Ondansetron HCl - 8 MG Oral Tablet;  Therapy: (Recorded:23Nov2016) to Recorded 4. OxyCODONE HCl TABS;  Therapy: (Recorded:23Nov2016) to Recorded 5. Vitamin D 50000 UNIT CAPS;  Therapy: (Recorded:23Nov2016) to Recorded  Allergies Medication  1. No Known Drug Allergies  Family History Problems  1. Family history of renal failure (Z84.1) : Uncle  Social History Problems    Denied: History of Alcohol use   Caffeine use (F15.90)   Never a smoker   Number of children   Occupation   Separated from significant other (Z63.5)  Review of Systems Genitourinary, constitutional, skin, eye, otolaryngeal, hematologic/lymphatic, cardiovascular, pulmonary, endocrine, musculoskeletal,  gastrointestinal, neurological and psychiatric system(s) were reviewed and pertinent findings if present are noted and are otherwise negative.  Gastrointestinal:1  nausea1  and vomiting1 .     1 Amended By: Festus Aloe; May 20 2015 10:06 AM EST  Vitals Vital Signs [Data Includes: Last 1 Day]  Recorded: [id number] 08:39AM  Height: 5 ft 5 in Weight: 205 lb  BMI Calculated: 34.11 BSA Calculated: 2 Blood Pressure: 156 / 97 Temperature: 97.7 F Heart Rate: 77  Physical Exam Constitutional: Well nourished and well developed . No acute distress.  Cardiovascular: Heart rate and rhythm are normal . No peripheral edema.  Abdomen: The abdomen is soft and nontender. No masses are palpated. No CVA tenderness. No hernias are palpable. No hepatosplenomegaly noted.  Neuro/Psych:. Mood and affect are appropriate.    Results/Data Urine [Data Includes: Last 1 Day]   [id number]  COLOR YELLOW   APPEARANCE CLOUDY   SPECIFIC GRAVITY 1.020   pH 5.5   GLUCOSE NEGATIVE   BILIRUBIN NEGATIVE   KETONE NEGATIVE   BLOOD 3+   PROTEIN 1+   NITRITE NEGATIVE   LEUKOCYTE ESTERASE TRACE   SQUAMOUS EPITHELIAL/HPF 0-5 HPF  WBC 6-10 WBC/HPF  RBC 20-40 RBC/HPF  BACTERIA NONE SEEN HPF  CRYSTALS NONE SEEN HPF  CASTS NONE SEEN LPF  Yeast NONE SEEN HPF   Old records or history reviewed: epic - hospital and ED notes.  The following images/tracing/specimen were independently visualized:  CT and renal U/S.    Assessment Assessed  1. Nephrolithiasis (N20.0)  Plan Health Maintenance  1. UA With REFLEX; [Do Not Release]; Status:Complete;   [id number] 08:20AM Nephrolithiasis  2. Follow-up  Schedule Surgery Office  Follow-up  Status: Hold For - Appointment   Requested for: [id number] 3. URINE CULTURE; Status:Hold For - Specimen/Data Collection,Appointment; Requested  for:23Nov2016;   Discussion/Summary Left renal stones status post left ureteral stent-discussed with the patient the nature risks and  benefits of simply removing the stent, left ureteroscopy or shockwave lithotripsy. All questions answered. She would like to try to make it to ureteroscopy. I gave her some samples of Rapaflo and we discussed the off label use of alpha blockers for stent pain. I also gave her some samples of Vesicare. Discussed risk of antimuscarinics as well. Discussed right stones and surveillance.      Signatures Electronically signed by : Festus Aloe, M.D.; May 20 2015  9:04AM EST Electronically signed by : Festus Aloe, M.D.; May 20 2015 10:06AM EST  Addendum, H&P update: After seeing the patient last week she developed right flank pain.  CT scan was done in the ED which revealed a stone in right kidney had passed into the ureter and was in the right proximal ureter.  I reviewed the images today. She continues to have right flank pain now and has not passed a stone.   She started nitrofurantoin last night and took a dose this morning for a low colony count of enterococcus consistent with asymptomatic bacteriuria. She is AFVSS today. I added Levaquin to her antibiotic regimen.   Discussed with patient, her husband and father the I discussed with the patient the nature, potential benefits, risks and alternatives to Bilateral ureteroscopy with holmium laser lithotripsy, stone basket extraction and ureteral stent placement, including side effects of the proposed treatment, the likelihood of the patient achieving the goals of the procedure, and any potential problems that might occur during the procedure or recuperation. All questions answered. Patient elects to proceed. Discussed she may need a staged procedure to deal with the right ureteral stone if retrograde access is difficult.  Also discussed postop stent placement.

## 2015-05-26 NOTE — Anesthesia Procedure Notes (Signed)
Procedure Name: LMA Insertion Date/Time: 05/26/2015 12:20 PM Performed by: Wanita Chamberlain Pre-anesthesia Checklist: Patient identified, Timeout performed, Emergency Drugs available, Suction available and Patient being monitored Patient Re-evaluated:Patient Re-evaluated prior to inductionOxygen Delivery Method: Circle system utilized Preoxygenation: Pre-oxygenation with 100% oxygen Intubation Type: IV induction Ventilation: Mask ventilation without difficulty LMA: LMA with gastric port inserted LMA Size: 4.0 Number of attempts: 1 Placement Confirmation: positive ETCO2 and breath sounds checked- equal and bilateral Tube secured with: Tape Dental Injury: Teeth and Oropharynx as per pre-operative assessment

## 2015-05-26 NOTE — Discharge Summary (Signed)
Physician Discharge Summary  Patient ID: [name] Sherman MRN: [id number] DOB/AGE: 1961-06-04 54 y.o.  Admit date: 05/26/2015 Discharge date: 05/26/2015  Admission Diagnoses: Ureteral obstruction  Discharge Diagnoses:  Active Problems:   Ureteral obstruction   Discharged Condition: good  Hospital Course:  She underwent bilateral ureteroscopic stone extraction and bilateral ureteral stent placement on 05/26/15. Post-operatively she had severe nausea, vomiting, and flank pain which could not be controlled with oral medications. She was admitted overnight for observation, IV antiemetics and IV narcotics. By the morning her nausea and vomiting had resolved, she was able to tolerate a regular diet and pain was well controlled with oral narcotics.   Consults: None  Significant Diagnostic Studies: none  Treatments: surgery: bilateral ureteroscopic stone extraction with laser lithotripsy and bilateral ureteral stent placement  Discharge Exam: Blood pressure 172/86, pulse 64, temperature 99.1 F (37.3 C), temperature source Oral, resp. rate 13, height 5\' 5"  (1.651 m), weight 93.441 kg (206 lb), SpO2 100 %. General appearance: alert, cooperative and appears stated age Head: Normocephalic, without obvious abnormality, atraumatic GI: soft, non-tender; bowel sounds normal; no masses,  no organomegaly Extremities: extremities normal, atraumatic, no cyanosis or edema  Disposition: 01-Home or Self Care     Medication List    TAKE these medications        amphetamine-dextroamphetamine 20 MG tablet  Commonly known as:  ADDERALL  Take 1 tablet (20 mg total) by mouth 2 (two) times daily.     atorvastatin 10 MG tablet  Commonly known as:  LIPITOR  Take 1 tablet (10 mg total) by mouth daily.     citalopram 20 MG tablet  Commonly known as:  CELEXA  Take 1 tablet (20 mg total) by mouth daily. Taking 3 tabs at HS     ibuprofen 200 MG tablet  Commonly known as:  ADVIL,MOTRIN  Take 600 mg  by mouth every 6 (six) hours as needed for moderate pain.     ibuprofen 800 MG tablet  Commonly known as:  ADVIL,MOTRIN  Take 1 tablet (800 mg total) by mouth 3 (three) times daily.     multivitamin with minerals Tabs tablet  Take 1 tablet by mouth daily.     nitrofurantoin (macrocrystal-monohydrate) 100 MG capsule  Commonly known as:  MACROBID  Take 1 capsule (100 mg total) by mouth at bedtime. **start when other nitrofurantoin prescription runs out     ondansetron 8 MG disintegrating tablet  Commonly known as:  ZOFRAN ODT  8mg  ODT q8 hours prn nausea     ondansetron 8 MG tablet  Commonly known as:  ZOFRAN  Take 1 tablet (8 mg total) by mouth every 8 (eight) hours as needed for nausea or vomiting.     oxyCODONE-acetaminophen 5-325 MG tablet  Commonly known as:  ROXICET  Take 1 tablet by mouth every 8 (eight) hours as needed for severe pain.     oxyCODONE-acetaminophen 5-325 MG tablet  Commonly known as:  ROXICET  Take 1-2 tablets by mouth every 6 (six) hours as needed for severe pain.     phenazopyridine 200 MG tablet  Commonly known as:  PYRIDIUM  Take 1 tablet (200 mg total) by mouth 3 (three) times daily as needed for pain.     ROXICODONE 5 MG immediate release tablet  Generic drug:  oxyCODONE  Take 10 mg by mouth every 4 (four) hours as needed for moderate pain or severe pain.     SUMAtriptan 100 MG tablet  Commonly known as:  IMITREX  Take 100 mg by mouth every 2 (two) hours as needed for migraine. May repeat in 2 hours if headache persists or recurs.     tamsulosin 0.4 MG Caps capsule  Commonly known as:  FLOMAX  Take 0.4 mg by mouth daily after breakfast.     tamsulosin 0.4 MG Caps capsule  Commonly known as:  FLOMAX  Take 1 capsule (0.4 mg total) by mouth daily.     VESICARE 10 MG tablet  Generic drug:  solifenacin  Take 10 mg by mouth every morning.     Vitamin D (Ergocalciferol) 50000 UNITS Caps capsule  Commonly known as:  DRISDOL  TAKE ONE CAPSULE  BY MOUTH ONCE A WEEK           Follow-up Information    Follow up with Festus Aloe, MD.   Specialty:  Urology   Why:  Office will call with appointment   Contact information:   [place] [address] [phone]       Signed: Acie Fredrickson 05/26/2015, 8:35 PM

## 2015-05-27 ENCOUNTER — Encounter (HOSPITAL_BASED_OUTPATIENT_CLINIC_OR_DEPARTMENT_OTHER): Payer: Self-pay | Admitting: Urology

## 2015-05-27 MED ORDER — HYDROMORPHONE HCL 2 MG PO TABS
2.0000 mg | ORAL_TABLET | ORAL | Status: DC | PRN
  Administered 2015-05-27 (×2): 2 mg via ORAL
  Filled 2015-05-27 (×2): qty 1

## 2015-05-27 MED ORDER — BELLADONNA-OPIUM 16.2-30 MG RE SUPP: 30.0000 mg | Freq: Three times a day (TID) | RECTAL | Status: DC | PRN

## 2015-05-27 MED ORDER — HYDROMORPHONE HCL 2 MG PO TABS: 2.0000 mg | ORAL_TABLET | ORAL | Status: DC | PRN

## 2015-05-27 MED ORDER — BISACODYL 10 MG RE SUPP
10.0000 mg | Freq: Every day | RECTAL | Status: DC | PRN
  Administered 2015-05-27: 10 mg via RECTAL
  Filled 2015-05-27: qty 1

## 2015-05-27 NOTE — Plan of Care (Signed)
Problem: Fluid Volume: Goal: Ability to maintain a balanced intake and output will improve Outcome: Progressing N/v becoming more controlled.

## 2015-05-27 NOTE — Anesthesia Postprocedure Evaluation (Signed)
Anesthesia Post Note  Patient: CHERUB WOHLERT  Procedure(s) Performed: Procedure(s) (LRB): CYSTOSCOPY WITH STENT PLACEMENT (Bilateral) CYSTOSCOPY WITH STENT REMOVAL (Left) CYSTOSCOPY WITH RETROGRADE PYELOGRAM (Bilateral) CYSTOSCOPY WITH URETEROSCOPY (Bilateral) HOLMIUM LASER APPLICATION (Left)  Patient location during evaluation: PACU Anesthesia Type: General Level of consciousness: awake and alert and patient cooperative Pain management: pain level controlled Vital Signs Assessment: post-procedure vital signs reviewed and stable Respiratory status: spontaneous breathing and respiratory function stable Cardiovascular status: stable Anesthetic complications: no    Last Vitals:  Filed Vitals:   05/27/15 0006 05/27/15 0551  BP:  131/72  Pulse:  65  Temp: 36.8 C 36.8 C  Resp:  18    Last Pain:  Filed Vitals:   05/27/15 0919  PainSc: [place]

## 2015-05-27 NOTE — Care Management Note (Signed)
Case Management Note  Patient Details  Name: [name] Sherman MRN: [id number] Date of Birth: 1961-06-03  Subjective/Objective:54 y/o f admitted w/Ureteral obstruction. From home.                    Action/Plan:d/c plan home.   Expected Discharge Date:                  Expected Discharge Plan:  Home/Self Care  In-House Referral:     Discharge planning Services  CM Consult  Post Acute Care Choice:    Choice offered to:     DME Arranged:    DME Agency:     HH Arranged:    HH Agency:     Status of Service:  In process, will continue to follow  Medicare Important Message Given:    Date Medicare IM Given:    Medicare IM give by:    Date Additional Medicare IM Given:    Additional Medicare Important Message give by:     If discussed at [place] of Stay Meetings, dates discussed:    Additional Comments:  Dessa Phi, RN 05/27/2015, 10:17 AM

## 2015-05-27 NOTE — Care Management Note (Signed)
Case Management Note  Patient Details  Name: [name] Sherman MRN: [id number] Date of Birth: [birth year]  Subjective/Objective:                    Action/Plan:d/c home no needs or orders.   Expected Discharge Date:                  Expected Discharge Plan:  Home/Self Care  In-House Referral:     Discharge planning Services  CM Consult  Post Acute Care Choice:    Choice offered to:     DME Arranged:    DME Agency:     HH Arranged:    [place] Agency:     Status of Service:  Completed, signed off  Medicare Important Message Given:    Date Medicare IM Given:    Medicare IM give by:    Date Additional Medicare IM Given:    Additional Medicare Important Message give by:     If discussed at [place] of Stay Meetings, dates discussed:    Additional Comments:  Dessa Phi, RN 05/27/2015, 2:38 PM

## 2015-05-28 ENCOUNTER — Other Ambulatory Visit: Payer: Self-pay | Admitting: Urology

## 2015-05-29 ENCOUNTER — Encounter (HOSPITAL_BASED_OUTPATIENT_CLINIC_OR_DEPARTMENT_OTHER): Payer: Self-pay | Admitting: Urology

## 2015-06-02 ENCOUNTER — Encounter (HOSPITAL_COMMUNITY): Payer: Self-pay | Admitting: *Deleted

## 2015-06-02 ENCOUNTER — Other Ambulatory Visit (HOSPITAL_COMMUNITY): Payer: Self-pay | Admitting: *Deleted

## 2015-06-04 ENCOUNTER — Emergency Department (HOSPITAL_COMMUNITY): Payer: Self-pay

## 2015-06-04 ENCOUNTER — Emergency Department (HOSPITAL_COMMUNITY)
Admission: EM | Admit: 2015-06-04 | Discharge: 2015-06-04 | Disposition: A | Discharge status: Home / Self Care | Payer: Self-pay | Attending: Emergency Medicine | Admitting: Emergency Medicine

## 2015-06-04 ENCOUNTER — Encounter (HOSPITAL_COMMUNITY): Payer: Self-pay | Admitting: Emergency Medicine

## 2015-06-04 DIAGNOSIS — K5900 Constipation, unspecified: Secondary | ICD-10-CM | POA: Insufficient documentation

## 2015-06-04 DIAGNOSIS — Z8719 Personal history of other diseases of the digestive system: Secondary | ICD-10-CM | POA: Insufficient documentation

## 2015-06-04 DIAGNOSIS — E785 Hyperlipidemia, unspecified: Secondary | ICD-10-CM | POA: Insufficient documentation

## 2015-06-04 DIAGNOSIS — Z791 Long term (current) use of non-steroidal anti-inflammatories (NSAID): Secondary | ICD-10-CM | POA: Insufficient documentation

## 2015-06-04 DIAGNOSIS — Z79899 Other long term (current) drug therapy: Secondary | ICD-10-CM | POA: Insufficient documentation

## 2015-06-04 DIAGNOSIS — F909 Attention-deficit hyperactivity disorder, unspecified type: Secondary | ICD-10-CM | POA: Insufficient documentation

## 2015-06-04 DIAGNOSIS — E559 Vitamin D deficiency, unspecified: Secondary | ICD-10-CM | POA: Insufficient documentation

## 2015-06-04 DIAGNOSIS — Z9851 Tubal ligation status: Secondary | ICD-10-CM | POA: Insufficient documentation

## 2015-06-04 DIAGNOSIS — Z90710 Acquired absence of both cervix and uterus: Secondary | ICD-10-CM | POA: Insufficient documentation

## 2015-06-04 DIAGNOSIS — G43909 Migraine, unspecified, not intractable, without status migrainosus: Secondary | ICD-10-CM | POA: Insufficient documentation

## 2015-06-04 DIAGNOSIS — Z792 Long term (current) use of antibiotics: Secondary | ICD-10-CM | POA: Insufficient documentation

## 2015-06-04 DIAGNOSIS — Z8541 Personal history of malignant neoplasm of cervix uteri: Secondary | ICD-10-CM | POA: Insufficient documentation

## 2015-06-04 LAB — COMPREHENSIVE METABOLIC PANEL
ALT: 18 U/L (ref 14–54)
AST: 20 U/L (ref 15–41)
Albumin: 4 g/dL (ref 3.5–5.0)
Alkaline Phosphatase: 86 U/L (ref 38–126)
Anion gap: 6 (ref 5–15)
BUN: 11 mg/dL (ref 6–20)
CHLORIDE: 106 mmol/L (ref 101–111)
CO2: 27 mmol/L (ref 22–32)
CREATININE: 0.92 mg/dL (ref 0.44–1.00)
Calcium: 9.2 mg/dL (ref 8.9–10.3)
GFR calc Af Amer: >60 mL/min (ref >60)
GLUCOSE: 117 mg/dL — AB (ref 65–99)
Potassium: 3.8 mmol/L (ref 3.5–5.1)
Sodium: 139 mmol/L (ref 135–145)
Total Bilirubin: 0.5 mg/dL (ref 0.3–1.2)
Total Protein: 7.5 g/dL (ref 6.5–8.1)

## 2015-06-04 LAB — CBC WITH DIFFERENTIAL/PLATELET
Basophils Absolute: 0.1 10*3/uL (ref 0.0–0.1)
Basophils Relative: 1 %
EOS PCT: 3 %
Eosinophils Absolute: 0.3 10*3/uL (ref 0.0–0.7)
HCT: 40.4 % (ref 36.0–46.0)
Hemoglobin: 13.1 g/dL (ref 12.0–15.0)
LYMPHS ABS: 2 10*3/uL (ref 0.7–4.0)
LYMPHS PCT: 17 %
MCH: 27.6 pg (ref 26.0–34.0)
MCHC: 32.4 g/dL (ref 30.0–36.0)
MCV: 85.2 fL (ref 78.0–100.0)
MONO ABS: 0.6 10*3/uL (ref 0.1–1.0)
MONOS PCT: 5 %
Neutro Abs: 9.1 10*3/uL — ABNORMAL HIGH (ref 1.7–7.7)
Neutrophils Relative %: 74 %
PLATELETS: 294 10*3/uL (ref 150–400)
RBC: 4.74 MIL/uL (ref 3.87–5.11)
RDW: 13.1 % (ref 11.5–15.5)
WBC: 12.1 10*3/uL — ABNORMAL HIGH (ref 4.0–10.5)

## 2015-06-04 LAB — URINALYSIS, ROUTINE W REFLEX MICROSCOPIC
Bilirubin Urine: NEGATIVE
GLUCOSE, UA: NEGATIVE mg/dL
Ketones, ur: NEGATIVE mg/dL
Nitrite: NEGATIVE
PH: 6 (ref 5.0–8.0)
PROTEIN: 100 mg/dL — AB
SPECIFIC GRAVITY, URINE: 1.018 (ref 1.005–1.030)

## 2015-06-04 LAB — URINE MICROSCOPIC-ADD ON

## 2015-06-04 LAB — PREGNANCY, URINE: PREG TEST UR: NEGATIVE

## 2015-06-04 LAB — LIPASE, BLOOD: LIPASE: 25 U/L (ref 11–51)

## 2015-06-04 MED ORDER — SORBITOL 70 % SOLN
960.0000 mL | TOPICAL_OIL | Freq: Once | ORAL | Status: DC
  Filled 2015-06-04: qty 240

## 2015-06-04 MED ORDER — MAGNESIUM CITRATE PO SOLN: 1.0000 | Freq: Once | ORAL | Status: DC

## 2015-06-04 MED ORDER — IOHEXOL 300 MG/ML  SOLN
25.0000 mL | Freq: Once | INTRAMUSCULAR | Status: AC | PRN
  Administered 2015-06-04: 25 mL via ORAL

## 2015-06-04 MED ORDER — POLYETHYLENE GLYCOL 3350 17 G PO PACK: 17.0000 g | PACK | Freq: Every day | ORAL | Status: DC | PRN

## 2015-06-04 MED ORDER — MAGNESIUM CITRATE PO SOLN
1.0000 | Freq: Once | ORAL | Status: AC
  Administered 2015-06-04: 1 via ORAL
  Filled 2015-06-04: qty 296

## 2015-06-04 MED ORDER — SODIUM CHLORIDE 0.9 % IV BOLUS (SEPSIS)
1000.0000 mL | Freq: Once | INTRAVENOUS | Status: AC
  Administered 2015-06-04: 1000 mL via INTRAVENOUS

## 2015-06-04 MED ORDER — MINERAL OIL RE ENEM
1.0000 | ENEMA | Freq: Once | RECTAL | Status: AC
  Administered 2015-06-04: 1 via RECTAL
  Filled 2015-06-04: qty 1

## 2015-06-04 MED ORDER — IOHEXOL 300 MG/ML  SOLN
100.0000 mL | Freq: Once | INTRAMUSCULAR | Status: AC | PRN
  Administered 2015-06-04: 100 mL via INTRAVENOUS

## 2015-06-04 NOTE — ED Notes (Signed)
Patient states she is having success from previous interventions-small formed stool

## 2015-06-04 NOTE — ED Provider Notes (Signed)
CSN: [id number]     Arrival date & time 06/04/15  0020 History  By signing my name below, I, [name] Sherman, attest that this documentation has been prepared under the direction and in the presence of [name], MD. Electronically Signed: Helane Sherman, ED Scribe. 06/04/2015. 2:22 AM.    Chief Complaint  Patient presents with  . Abdominal Pain  . Constipation   The history is provided by the patient. No language interpreter was used.   HPI Comments: [name] Sherman is a 54 y.o. female with a PMHx of nephrolithiasis and HLD as well as a PSHx of cystoscopy (05/26/2015) and hysterectomy who presents to the Emergency Department complaining of constant, worsening, "severe" right sided abdominal pain onset 2 days ago, and constipation onset 1 week ago. She reports associated abdominal distension, decreased BM output ("only a few drops"), and nausea onset 1 day ago. She states her last normal BM was before her cystoscopy. She notes she has tried enemas and has even attempted to remove some of the feces manually without relief. She reports a PSHx of cystoscopy on the left kidney 2 weeks ago, at which time she had 2 stents placed. She notes she was on pain medication (dilaudid and percocet) which she has stopped taking as of 2 days ago. She notes that she is scheduled to have a cystoscopy on the right kidney soon as well. She also reports a PSHx of hysterectomy.   Past Medical History  Diagnosis Date  . Migraine   . Vitamin D deficiency   . Hyperlipidemia   . Umbilical hernia without obstruction or gangrene   . Urgency of urination   . Left ureteral stone   . Nephrolithiasis     bilateral nonobstructive per ct  . History of cervical cancer     s/p  laser ablation of cervix 1980's  . ADD (attention deficit disorder)   . OSA (obstructive sleep apnea)     pt used cpap up until 2013 states lost wt and did not need anymore   Past Surgical History  Procedure Laterality Date  . Bunionectomy Right  2004  . Cystoscopy with retrograde pyelogram, ureteroscopy and stent placement Left 05/01/2015    Procedure: CYSTOSCOPY WITH RETROGRADE PYELOGRAM AND STENT PLACEMENT;  Surgeon: Festus Aloe, MD;  Location: [facility];  Service: Urology;  Laterality: Left;  . Laser ablation of the cervix  x2  1980's  . Tubal ligation  1980's  . Laparoscopic assisted vaginal hysterectomy  04-23-2002    and Left Salpingoophorectomy/  Anterior Repair/  Tension Free Tape Sling placement  . Cystoscopy with stent placement Bilateral 05/26/2015    Procedure: CYSTOSCOPY WITH STENT PLACEMENT;  Surgeon: Festus Aloe, MD;  Location: [facility];  Service: Urology;  Laterality: Bilateral;  . Cystoscopy w/ ureteral stent removal Left 05/26/2015    Procedure: CYSTOSCOPY WITH STENT REMOVAL;  Surgeon: Festus Aloe, MD;  Location: [facility];  Service: Urology;  Laterality: Left;  . Cystoscopy w/ retrogrades Bilateral 05/26/2015    Procedure: CYSTOSCOPY WITH RETROGRADE PYELOGRAM;  Surgeon: Festus Aloe, MD;  Location: [facility];  Service: Urology;  Laterality: Bilateral;  . Cystoscopy with ureteroscopy Bilateral 05/26/2015    Procedure: CYSTOSCOPY WITH URETEROSCOPY;  Surgeon: Festus Aloe, MD;  Location: [facility];  Service: Urology;  Laterality: Bilateral;  . Holmium laser application Left [id number]    Procedure: HOLMIUM LASER APPLICATION;  Surgeon: Festus Aloe, MD;  Location: [facility];  Service: Urology;  Laterality: Left;  . Abdominal hysterectomy     Family History  Problem Relation Age of Onset  . Alcohol abuse Mother   . Arthritis Father   . Hyperlipidemia Father   . Heart disease Father   . Hypertension Father   . Kidney disease Paternal Uncle   . Cancer Maternal Grandmother     colon  . Cancer Paternal Grandmother     breast   Social History  Substance Use Topics  . Smoking status: Never Smoker   .  Smokeless tobacco: Never Used  . Alcohol Use: No     Comment: 1 x year   OB History    No data available     Review of Systems A complete 10 system review of systems was obtained and all systems are negative except as noted in the HPI and PMH.   Allergies  Review of patient's allergies indicates no known allergies.  Home Medications   Prior to Admission medications   Medication Sig Start Date End Date Taking? Authorizing Provider  amphetamine-dextroamphetamine (ADDERALL) 20 MG tablet Take 1 tablet (20 mg total) by mouth 2 (two) times daily. Patient taking differently: Take 20 mg by mouth 2 (two) times daily. Works 3rd shift -- takes 2300  And 1500 04/03/15  Yes [name], MD  atorvastatin (LIPITOR) 10 MG tablet Take 1 tablet (10 mg total) by mouth daily. Patient taking differently: Take 10 mg by mouth daily at 6 PM.  03/14/14  Yes [name], MD  bisacodyl (DULCOLAX) 5 MG EC tablet Take 5 mg by mouth daily as needed for mild constipation or moderate constipation.   Yes Historical Provider, MD  citalopram (CELEXA) 20 MG tablet Take 1 tablet (20 mg total) by mouth daily. Taking 3 tabs at HS Patient taking differently: Take 60 mg by mouth at bedtime.  03/14/14  Yes [name], MD  HYDROmorphone (DILAUDID) 2 MG tablet Take 1-2 tablets (2-4 mg total) by mouth every 4 (four) hours as needed for severe pain. 05/27/15  Yes Acie Fredrickson, MD  ibuprofen (ADVIL,MOTRIN) 800 MG tablet Take 1 tablet (800 mg total) by mouth 3 (three) times daily. 05/21/15  Yes April Palumbo, MD  nitrofurantoin, macrocrystal-monohydrate, (MACROBID) 100 MG capsule Take 100 mg by mouth 2 (two) times daily.   Yes Historical Provider, MD  ondansetron (ZOFRAN ODT) 8 MG disintegrating tablet 8mg  ODT q8 hours prn nausea 05/21/15  Yes April Palumbo, MD  OVER THE COUNTER MEDICATION Take 1 tablet by mouth daily. Cascara, herb for constipation   Yes Historical Provider, MD  oxyCODONE-acetaminophen  (PERCOCET/ROXICET) 5-325 MG tablet Take 1 tablet by mouth every 8 (eight) hours as needed. pain 05/03/15  Yes Historical Provider, MD  phenazopyridine (PYRIDIUM) 200 MG tablet Take 1 tablet (200 mg total) by mouth 3 (three) times daily as needed for pain. 05/11/15  Yes [name], MD  sodium phosphate (FLEET) enema Place 1 enema rectally once. follow package directions   Yes Historical Provider, MD  solifenacin (VESICARE) 10 MG tablet Take 10 mg by mouth every morning.   Yes Historical Provider, MD  tamsulosin (FLOMAX) 0.4 MG CAPS capsule Take 1 capsule (0.4 mg total) by mouth daily. 05/21/15  Yes April Palumbo, MD  Vitamin D, Ergocalciferol, (DRISDOL) 50000 UNITS CAPS capsule TAKE ONE CAPSULE BY MOUTH ONCE A WEEK 04/23/15  Yes [name], MD  belladonna-opium (B&O SUPPRETTES) 16.2-30 MG suppository Place 1 suppository rectally every 8 (eight) hours as needed (pain & bladder spasms).  05/27/15   Acie Fredrickson, MD  Multiple Vitamin (MULTIVITAMIN WITH MINERALS) TABS tablet Take 1 tablet by mouth daily.    Historical Provider, MD  nitrofurantoin, macrocrystal-monohydrate, (MACROBID) 100 MG capsule Take 1 capsule (100 mg total) by mouth at bedtime. **start when other nitrofurantoin prescription runs out 05/26/15   Festus Aloe, MD  SUMAtriptan (IMITREX) 100 MG tablet Take 100 mg by mouth every 2 (two) hours as needed for migraine. May repeat in 2 hours if headache persists or recurs.    Historical Provider, MD   BP 146/89 mmHg  Pulse 78  Temp(Src) 98 F (36.7 C) (Oral)  Resp 17  Ht 5\' 5"  (1.651 m)  Wt 205 lb (92.987 kg)  BMI 34.11 kg/m2  SpO2 97% Physical Exam  Constitutional: She is oriented to person, place, and time. She appears well-developed and well-nourished. No distress.  HENT:  Head: Normocephalic and atraumatic.  Nose: Nose normal.  Mouth/Throat: Oropharynx is clear and moist. No oropharyngeal exudate.  Eyes: Conjunctivae and EOM are normal. Pupils are equal, round,  and reactive to light. No scleral icterus.  Neck: Normal range of motion. Neck supple. No JVD present. No tracheal deviation present. No thyromegaly present.  Cardiovascular: Normal rate, regular rhythm and normal heart sounds.  Exam reveals no gallop and no friction rub.   No murmur heard. Pulmonary/Chest: Effort normal and breath sounds normal. No respiratory distress. She has no wheezes. She exhibits no tenderness.  Abdominal: Soft. Bowel sounds are normal. She exhibits distension. She exhibits no mass. There is no tenderness. There is no rebound and no guarding.  Suprapubic and periumbilical TTP  Musculoskeletal: Normal range of motion. She exhibits no edema or tenderness.  Lymphadenopathy:    She has no cervical adenopathy.  Neurological: She is alert and oriented to person, place, and time. No cranial nerve deficit. She exhibits normal muscle tone.  Skin: Skin is warm and dry. No rash noted. No erythema. No pallor.  Nursing note and vitals reviewed.   ED Course  Procedures  DIAGNOSTIC STUDIES: Oxygen Saturation is 98% on RA, normal by my interpretation.    COORDINATION OF CARE: 2:08 AM - Discussed plans to . Pt advised of plan for treatment and pt agrees.  Labs Review Labs Reviewed  CBC WITH DIFFERENTIAL/PLATELET - Abnormal; Notable for the following:    WBC 12.1 (*)    Neutro Abs 9.1 (*)    All other components within normal limits  COMPREHENSIVE METABOLIC PANEL - Abnormal; Notable for the following:    Glucose, Bld 117 (*)    All other components within normal limits  URINALYSIS, ROUTINE W REFLEX MICROSCOPIC (NOT AT [facility]) - Abnormal; Notable for the following:    Color, Urine BROWN (*)    APPearance TURBID (*)    Hgb urine dipstick LARGE (*)    Protein, ur 100 (*)    Leukocytes, UA MODERATE (*)    All other components within normal limits  URINE MICROSCOPIC-ADD ON - Abnormal; Notable for the following:    Squamous Epithelial / LPF 6-30 (*)    Bacteria, UA FEW (*)     All other components within normal limits  LIPASE, BLOOD  PREGNANCY, URINE    Imaging Review Ct Abdomen Pelvis W Contrast  06/04/2015  CLINICAL DATA:  Ureteral stents with mild bilateral hydronephrosis. Small nonobstructing bilateral renal calculi noted. Correlation with urinalysis recommended to exclude UTI. EXAM: CT ABDOMEN AND PELVIS WITH CONTRAST TECHNIQUE: Multidetector CT imaging of the abdomen and pelvis was performed using  the standard protocol following bolus administration of intravenous contrast. CONTRAST:  117mL OMNIPAQUE IOHEXOL 300 MG/ML  SOLN COMPARISON:  CT dated 05/21/2015 FINDINGS: The visualized lung bases are clear. No intra-abdominal free air or free fluid. The liver, pancreas, spleen, and adrenal glands appear unremarkable. Minimal focal thickening at the gallbladder fundus likely represents a focal adenomyomatosis. No calcified gallstone or pericholecystic fluid. Bilateral pigtail ureteral stents noted with proximal tip in the renal pelvises and distal end within the urinary bladder. There are stable appearing mild bilateral hydronephrosis. There is a 3 mm nonobstructing calculus in the inferior pole of the right kidney. A 3 mm nonobstructing inferior pole and a punctate interpolar calculi noted in the left kidney. The previously seen 8 mm calculus in the interpolar aspect of the left kidney is not visualized on the current study. The year bladder is grossly unremarkable. The prostate gland is mildly enlarged. Copious amount of dense stool noted throughout the colon. No evidence of bowel obstruction or inflammation. Normal appendix. There is a 1.9 x 2.7 cm nodular soft tissue lesion adjacent and posterior to the cecum (series 2, image 61 and coronal series 4, image 45) which appears grossly stable since the CT study dated 12/10/2012. This may represent an enlarged lymph nodes and may be benign given stability since 2014. A neoplastic process is not excluded. MRI is recommended for  further evaluation. The abdominal aorta and IVC appear patent. No portal venous gas identified. There is no lymphadenopathy. There is a small fat containing umbilical hernia. A small supraumbilical supraumbilical and a smaller infraumbilical fat containing hernias noted. There is no inflammatory changes of the herniated fat to suggest strangulation. There is degenerative changes of the spine. No acute fracture. IMPRESSION: Constipation. No evidence of bowel obstruction or inflammation. Normal appendix. Bilateral ureteral stents with mild bilateral hydronephrosis. Small nonobstructing bilateral renal calculi. Correlation with urinalysis recommended to exclude UTI. Grossly stable appearing retrocecal nodular mass of indeterminate etiology. MRI is recommended for further characterization. Electronically Signed   By: Anner Crete M.D.   On: 06/04/2015 05:29   I have personally reviewed and evaluated these images and lab results as part of my medical decision-making.   EKG Interpretation None      MDM   Final diagnoses:  None    Patient presents to the ED for abdominal pain after being put on pain medication for renal stents.  She has had constipation as well.  CT was obtained to evaluate for another cause of her pain since she is tender on exam.  CT only shows constipation.  Urine shows a UTI, she is already on macrobid.  She was given an enema and mag citrate in the ED.  Will write rx for additional dose of mag citrate to take tomorrow, followed by daily miralax.  She appears well and in AND. VS remain within her normal limits and she is safe for DC.   I personally performed the services described in this documentation, which was scribed in my presence. The recorded information has been reviewed and is accurate.     [name], MD 06/04/15 [phone]

## 2015-06-04 NOTE — ED Notes (Signed)
Pt states she had surgery for kidney stones here about 2 weeks ago  Pt states this weekend she started having lower abd pain  Pt states she realized she was very constipated so she has been taking cascara without any relief  Pt states she has eaten a whole bag of prunes, used 2 enemas, and tried to manually remove some of the feces without relief  Pt states her abdomen is distended and she feels miserable  Pt states she has not eaten today because she is feeling nauseated  Denies vomiting

## 2015-06-04 NOTE — Discharge Instructions (Signed)
Constipation, Adult [name] Sherman, your CT scan only shows constipation.  Take another dose of magnesium citrate tomorrow, then use miralax daily so you have 1-2 soft bowel movements per day.  See below for a high fiber diet as well.  See your primary care doctor within 3 days for close follow up. If symptoms worsen, come back to the ED immediately. Thank you. Constipation is when a person:  Poops (has a bowel movement) less than 3 times a week.  Has a hard time pooping.  Has poop that is dry, hard, or bigger than normal. HOME CARE   Eat foods with a lot of fiber in them. This includes fruits, vegetables, beans, and whole grains such as brown rice.  Avoid fatty foods and foods with a lot of sugar. This includes french fries, hamburgers, cookies, candy, and soda.  If you are not getting enough fiber from food, take products with added fiber in them (supplements).  Drink enough fluid to keep your pee (urine) clear or pale yellow.  Exercise on a regular basis, or as told by your doctor.  Go to the restroom when you feel like you need to poop. Do not hold it.  Only take medicine as told by your doctor. Do not take medicines that help you poop (laxatives) without talking to your doctor first. GET HELP RIGHT AWAY IF:   You have bright red blood in your poop (stool).  Your constipation lasts more than 4 days or gets worse.  You have belly (abdominal) or butt (rectal) pain.  You have thin poop (as thin as a pencil).  You lose weight, and it cannot be explained. MAKE SURE YOU:   Understand these instructions.  Will watch your condition.  Will get help right away if you are not doing well or get worse.   This information is not intended to replace advice given to you by your health care provider. Make sure you discuss any questions you have with your health care provider.   Document Released: 11/30/2007 Document Revised: 07/04/2014 Document Reviewed: 03/25/2013 Elsevier Interactive  Patient Education 2016 Elsevier Inc.  High-Fiber Diet Fiber, also called dietary fiber, is a type of carbohydrate found in fruits, vegetables, whole grains, and beans. A high-fiber diet can have many health benefits. Your health care provider may recommend a high-fiber diet to help:  Prevent constipation. Fiber can make your bowel movements more regular.  Lower your cholesterol.  Relieve hemorrhoids, uncomplicated diverticulosis, or irritable bowel syndrome.  Prevent overeating as part of a weight-loss plan.  Prevent heart disease, type 2 diabetes, and certain cancers. WHAT IS MY PLAN? The recommended daily intake of fiber includes:  38 grams for men under age 54.  90 grams for men over age 70.  62 grams for women under age 19.  67 grams for women over age 55. You can get the recommended daily intake of dietary fiber by eating a variety of fruits, vegetables, grains, and beans. Your health care provider may also recommend a fiber supplement if it is not possible to get enough fiber through your diet. WHAT DO I NEED TO KNOW ABOUT A HIGH-FIBER DIET?  Fiber supplements have not been widely studied for their effectiveness, so it is better to get fiber through food sources.  Always check the fiber content on thenutrition facts label of any prepackaged food. Look for foods that contain at least 5 grams of fiber per serving.  Ask your dietitian if you have questions about specific foods that are  related to your condition, especially if those foods are not listed in the following section.  Increase your daily fiber consumption gradually. Increasing your intake of dietary fiber too quickly may cause bloating, cramping, or gas.  Drink plenty of water. Water helps you to digest fiber. WHAT FOODS CAN I EAT? Grains Whole-grain breads. Multigrain cereal. Oats and oatmeal. Brown rice. Barley. Bulgur wheat. [place]. Bran muffins. Popcorn. Rye wafer crackers. Vegetables Sweet potatoes.  Spinach. Kale. Artichokes. Cabbage. Broccoli. Green peas. Carrots. Squash. Fruits Berries. Pears. Apples. Oranges. Avocados. Prunes and raisins. Dried figs. Meats and Other Protein Sources [place], kidney, pinto, and soy beans. Split peas. Lentils. Nuts and seeds. Dairy Fiber-fortified yogurt. Beverages Fiber-fortified soy milk. Fiber-fortified orange juice. Other Fiber bars. The items listed above may not be a complete list of recommended foods or beverages. Contact your dietitian for more options. WHAT FOODS ARE NOT RECOMMENDED? Grains White bread. Pasta made with refined flour. White rice. Vegetables Fried potatoes. Canned vegetables. Well-cooked vegetables.  Fruits Fruit juice. Cooked, strained fruit. Meats and Other Protein Sources Fatty cuts of meat. Fried Sales executive or fried fish. Dairy Milk. Yogurt. Cream cheese. Sour cream. Beverages Soft drinks. Other Cakes and pastries. Butter and oils. The items listed above may not be a complete list of foods and beverages to avoid. Contact your dietitian for more information. WHAT ARE SOME TIPS FOR INCLUDING HIGH-FIBER FOODS IN MY DIET?  Eat a wide variety of high-fiber foods.  Make sure that half of all grains consumed each day are whole grains.  Replace breads and cereals made from refined flour or white flour with whole-grain breads and cereals.  Replace white rice with brown rice, bulgur wheat, or millet.  Start the day with a breakfast that is high in fiber, such as a cereal that contains at least 5 grams of fiber per serving.  Use beans in place of meat in soups, salads, or pasta.  Eat high-fiber snacks, such as berries, raw vegetables, nuts, or popcorn.   This information is not intended to replace advice given to you by your health care provider. Make sure you discuss any questions you have with your health care provider.   Document Released: 06/13/2005 Document Revised: 07/04/2014 Document Reviewed: 11/26/2013 Elsevier  Interactive Patient Education [employer].

## 2015-06-09 ENCOUNTER — Encounter (HOSPITAL_COMMUNITY): Payer: Self-pay

## 2015-06-09 ENCOUNTER — Ambulatory Visit (HOSPITAL_COMMUNITY): Payer: Self-pay | Admitting: Certified Registered"

## 2015-06-09 ENCOUNTER — Encounter (HOSPITAL_COMMUNITY)
Admission: RE | Disposition: A | Discharge status: Home / Self Care | Payer: Self-pay | Source: Ambulatory Visit | Attending: Urology

## 2015-06-09 ENCOUNTER — Ambulatory Visit (HOSPITAL_COMMUNITY)
Admission: RE | Admit: 2015-06-09 | Discharge: 2015-06-09 | Disposition: A | Discharge status: Home / Self Care | Payer: Self-pay | Source: Ambulatory Visit | Attending: Urology | Admitting: Urology

## 2015-06-09 ENCOUNTER — Ambulatory Visit (HOSPITAL_COMMUNITY): Payer: Self-pay

## 2015-06-09 DIAGNOSIS — Z841 Family history of disorders of kidney and ureter: Secondary | ICD-10-CM | POA: Insufficient documentation

## 2015-06-09 DIAGNOSIS — Z79899 Other long term (current) drug therapy: Secondary | ICD-10-CM | POA: Insufficient documentation

## 2015-06-09 DIAGNOSIS — Z79891 Long term (current) use of opiate analgesic: Secondary | ICD-10-CM | POA: Insufficient documentation

## 2015-06-09 DIAGNOSIS — Z6834 Body mass index (BMI) 34.0-34.9, adult: Secondary | ICD-10-CM | POA: Insufficient documentation

## 2015-06-09 DIAGNOSIS — E669 Obesity, unspecified: Secondary | ICD-10-CM | POA: Insufficient documentation

## 2015-06-09 DIAGNOSIS — N200 Calculus of kidney: Secondary | ICD-10-CM | POA: Insufficient documentation

## 2015-06-09 DIAGNOSIS — G4733 Obstructive sleep apnea (adult) (pediatric): Secondary | ICD-10-CM | POA: Insufficient documentation

## 2015-06-09 DIAGNOSIS — E7800 Pure hypercholesterolemia, unspecified: Secondary | ICD-10-CM | POA: Insufficient documentation

## 2015-06-09 DIAGNOSIS — Z9989 Dependence on other enabling machines and devices: Secondary | ICD-10-CM | POA: Insufficient documentation

## 2015-06-09 HISTORY — PX: CYSTOSCOPY/URETEROSCOPY/HOLMIUM LASER/STENT PLACEMENT: SHX6546

## 2015-06-09 HISTORY — PX: HOLMIUM LASER APPLICATION: SHX5852

## 2015-06-09 SURGERY — CYSTOSCOPY/URETEROSCOPY/HOLMIUM LASER/STENT PLACEMENT
Anesthesia: General | Laterality: Right

## 2015-06-09 MED ORDER — LEVOFLOXACIN 500 MG PO TABS: 500.0000 mg | ORAL_TABLET | Freq: Every day | ORAL | Status: DC

## 2015-06-09 MED ORDER — OXYCODONE-ACETAMINOPHEN 5-325 MG PO TABS: 1.0000 | ORAL_TABLET | Freq: Four times a day (QID) | ORAL | Status: DC | PRN

## 2015-06-09 MED ORDER — LIDOCAINE HCL (CARDIAC) 20 MG/ML IV SOLN
INTRAVENOUS | Status: AC
  Filled 2015-06-09: qty 5

## 2015-06-09 MED ORDER — ONDANSETRON HCL 4 MG/2ML IJ SOLN
INTRAMUSCULAR | Status: DC | PRN
  Administered 2015-06-09: 4 mg via INTRAVENOUS

## 2015-06-09 MED ORDER — FENTANYL CITRATE (PF) 100 MCG/2ML IJ SOLN
INTRAMUSCULAR | Status: AC
  Filled 2015-06-09: qty 2

## 2015-06-09 MED ORDER — PROPOFOL 10 MG/ML IV BOLUS
INTRAVENOUS | Status: AC
  Filled 2015-06-09: qty 20

## 2015-06-09 MED ORDER — FENTANYL CITRATE (PF) 250 MCG/5ML IJ SOLN
INTRAMUSCULAR | Status: DC | PRN
  Administered 2015-06-09: 50 ug via INTRAVENOUS
  Administered 2015-06-09: 100 ug via INTRAVENOUS
  Administered 2015-06-09 (×2): 50 ug via INTRAVENOUS

## 2015-06-09 MED ORDER — STERILE WATER FOR IRRIGATION IR SOLN
Status: DC | PRN
  Administered 2015-06-09: 500 mL

## 2015-06-09 MED ORDER — PROPOFOL 10 MG/ML IV BOLUS
INTRAVENOUS | Status: DC | PRN
  Administered 2015-06-09: 200 mg via INTRAVENOUS
  Administered 2015-06-09: 50 mg via INTRAVENOUS

## 2015-06-09 MED ORDER — FENTANYL CITRATE (PF) 100 MCG/2ML IJ SOLN
25.0000 ug | INTRAMUSCULAR | Status: DC | PRN
  Administered 2015-06-09 (×2): 50 ug via INTRAVENOUS

## 2015-06-09 MED ORDER — SODIUM CHLORIDE 0.9 % IJ SOLN
INTRAMUSCULAR | Status: AC
  Filled 2015-06-09: qty 10

## 2015-06-09 MED ORDER — SODIUM CHLORIDE 0.9 % IR SOLN
Status: DC | PRN
  Administered 2015-06-09: 4000 mL via INTRAVESICAL

## 2015-06-09 MED ORDER — MIDAZOLAM HCL 2 MG/2ML IJ SOLN
INTRAMUSCULAR | Status: AC
  Filled 2015-06-09: qty 2

## 2015-06-09 MED ORDER — KETOROLAC TROMETHAMINE 30 MG/ML IJ SOLN
INTRAMUSCULAR | Status: AC
  Filled 2015-06-09: qty 1

## 2015-06-09 MED ORDER — DEXAMETHASONE SODIUM PHOSPHATE 10 MG/ML IJ SOLN
INTRAMUSCULAR | Status: AC
  Filled 2015-06-09: qty 1

## 2015-06-09 MED ORDER — LEVOFLOXACIN IN D5W 500 MG/100ML IV SOLN
500.0000 mg | INTRAVENOUS | Status: AC
  Administered 2015-06-09: 500 mg via INTRAVENOUS
  Filled 2015-06-09: qty 100

## 2015-06-09 MED ORDER — LIDOCAINE HCL (CARDIAC) 20 MG/ML IV SOLN
INTRAVENOUS | Status: DC | PRN
  Administered 2015-06-09: 50 mg via INTRAVENOUS

## 2015-06-09 MED ORDER — ROCURONIUM BROMIDE 100 MG/10ML IV SOLN
INTRAVENOUS | Status: AC
  Filled 2015-06-09: qty 1

## 2015-06-09 MED ORDER — LACTATED RINGERS IV SOLN
INTRAVENOUS | Status: DC
  Administered 2015-06-09: 11:00:00 via INTRAVENOUS
  Administered 2015-06-09: 1000 mL via INTRAVENOUS
  Administered 2015-06-09: 12:00:00 via INTRAVENOUS

## 2015-06-09 MED ORDER — DEXAMETHASONE SODIUM PHOSPHATE 10 MG/ML IJ SOLN
INTRAMUSCULAR | Status: DC | PRN
  Administered 2015-06-09: 10 mg via INTRAVENOUS

## 2015-06-09 MED ORDER — FENTANYL CITRATE (PF) 250 MCG/5ML IJ SOLN
INTRAMUSCULAR | Status: AC
  Filled 2015-06-09: qty 5

## 2015-06-09 MED ORDER — PROMETHAZINE HCL 25 MG/ML IJ SOLN
6.2500 mg | INTRAMUSCULAR | Status: DC | PRN
  Administered 2015-06-09: 6.25 mg via INTRAVENOUS

## 2015-06-09 MED ORDER — KETOROLAC TROMETHAMINE 30 MG/ML IJ SOLN
INTRAMUSCULAR | Status: DC | PRN
  Administered 2015-06-09: 30 mg via INTRAVENOUS

## 2015-06-09 MED ORDER — ONDANSETRON HCL 4 MG/2ML IJ SOLN
INTRAMUSCULAR | Status: AC
  Filled 2015-06-09: qty 2

## 2015-06-09 MED ORDER — PROMETHAZINE HCL 25 MG/ML IJ SOLN
INTRAMUSCULAR | Status: AC
  Filled 2015-06-09: qty 1

## 2015-06-09 MED ORDER — FENTANYL CITRATE (PF) 100 MCG/2ML IJ SOLN
50.0000 ug | INTRAMUSCULAR | Status: DC | PRN
  Administered 2015-06-09: 50 ug via INTRAVENOUS

## 2015-06-09 MED ORDER — 0.9 % SODIUM CHLORIDE (POUR BTL) OPTIME
TOPICAL | Status: DC | PRN
  Administered 2015-06-09: 1000 mL

## 2015-06-09 SURGICAL SUPPLY — 26 items
BAG URO CATCHER STRL LF (DRAPE) ×2 IMPLANT
BASKET DAKOTA 1.9FR 11X120 (BASKET) ×1 IMPLANT
BASKET LASER NITINOL 1.9FR (BASKET) IMPLANT
BASKET STNLS GEMINI 4WIRE 3FR (BASKET) IMPLANT
BASKET ZERO TIP NITINOL 2.4FR (BASKET) IMPLANT
BSKT STON RTRVL 120 1.9FR (BASKET)
BSKT STON RTRVL GEM 120X11 3FR (BASKET)
BSKT STON RTRVL ZERO TP 2.4FR (BASKET)
CATH INTERMIT  6FR 70CM (CATHETERS) IMPLANT
CLOTH BEACON ORANGE TIMEOUT ST (SAFETY) ×2 IMPLANT
FIBER LASER FLEXIVA 1000 (UROLOGICAL SUPPLIES) IMPLANT
FIBER LASER FLEXIVA 200 (UROLOGICAL SUPPLIES) ×1 IMPLANT
FIBER LASER FLEXIVA 365 (UROLOGICAL SUPPLIES) IMPLANT
FIBER LASER FLEXIVA 550 (UROLOGICAL SUPPLIES) IMPLANT
FIBER LASER TRAC TIP (UROLOGICAL SUPPLIES) ×1 IMPLANT
GLOVE BIOGEL M STRL SZ7.5 (GLOVE) ×2 IMPLANT
GOWN STRL REUS W/TWL XL LVL3 (GOWN DISPOSABLE) ×2 IMPLANT
GUIDEWIRE ANG ZIPWIRE 038X150 (WIRE) IMPLANT
GUIDEWIRE STR DUAL SENSOR (WIRE) ×3 IMPLANT
IV NS IRRIG 3000ML ARTHROMATIC (IV SOLUTION) ×4 IMPLANT
PACK CYSTO (CUSTOM PROCEDURE TRAY) ×2 IMPLANT
SHEATH ACCESS URETERAL 24CM (SHEATH) IMPLANT
SHEATH ACCESS URETERAL 38CM (SHEATH) ×2 IMPLANT
SHEATH ACCESS URETERAL 54CM (SHEATH) IMPLANT
STENT CONTOUR 6FRX26X.038 (STENTS) ×1 IMPLANT
SYRINGE IRR TOOMEY STRL 70CC (SYRINGE) IMPLANT

## 2015-06-09 NOTE — Progress Notes (Signed)
Patient to stay in PACU at least one hour and one hour in Short Stay- per Dr. Delma Post

## 2015-06-09 NOTE — Anesthesia Preprocedure Evaluation (Signed)
Anesthesia Evaluation  Patient identified by MRN, date of birth, ID band Patient awake    Reviewed: Allergy & Precautions, NPO status , Patient's Chart, lab work & pertinent test results  Airway Mallampati: II  TM Distance: >3 FB Neck ROM: Full    Dental no notable dental hx.    Pulmonary sleep apnea ,    Pulmonary exam normal breath sounds clear to auscultation       Cardiovascular negative cardio ROS Normal cardiovascular exam Rhythm:Regular Rate:Normal     Neuro/Psych  Headaches, PSYCHIATRIC DISORDERS    GI/Hepatic negative GI ROS, Neg liver ROS,   Endo/Other  negative endocrine ROS  Renal/GU Renal disease  negative genitourinary   Musculoskeletal negative musculoskeletal ROS (+)   Abdominal (+) + obese,   Peds negative pediatric ROS (+)  Hematology negative hematology ROS (+)   Anesthesia Other Findings   Reproductive/Obstetrics negative OB ROS                             Anesthesia Physical Anesthesia Plan  ASA: II  Anesthesia Plan: General   Post-op Pain Management:    Induction: Intravenous  Airway Management Planned: LMA  Additional Equipment:   Intra-op Plan:   Post-operative Plan: Extubation in OR  Informed Consent: I have reviewed the patients History and Physical, chart, labs and discussed the procedure including the risks, benefits and alternatives for the proposed anesthesia with the patient or authorized representative who has indicated his/her understanding and acceptance.   Dental advisory given  Plan Discussed with: CRNA  Anesthesia Plan Comments:         Anesthesia Quick Evaluation

## 2015-06-09 NOTE — Anesthesia Procedure Notes (Signed)
Procedure Name: LMA Insertion Date/Time: 06/09/2015 11:39 AM Performed by: Pilar Grammes Pre-anesthesia Checklist: Patient identified Patient Re-evaluated:Patient Re-evaluated prior to inductionOxygen Delivery Method: Circle system utilized Preoxygenation: Pre-oxygenation with 100% oxygen Intubation Type: IV induction Ventilation: Mask ventilation without difficulty LMA: LMA with gastric port inserted LMA Size: 4.0 Number of attempts: 1

## 2015-06-09 NOTE — H&P (View-Only) (Signed)
History of Present Illness Consultation nephrolithiasis referred by Dr. Darrick Meigs. PCP Dr.    Patient was seen in his hospital consult which underwent urgent cystoscopy with left ureteral stent placement for a 5 mm left UPJ stone and signs of sepsis. Urine culture grew 1000 mixed growth. She also has an 8 mm left lower pole stone. There was also a small stone in the right kidney.    She presented to the emergency department 2 days ago with symptoms of left flank pain. A renal ultrasound was normal. Her CBC and BMP were normal. A new urine culture was sent which has resulted in too young to read.     Today, she has continued stent pain in abd and left flank but it is improved. She's only been on opitates and pyridium.      Past Medical History Problems  1. History of CPAP (continuous positive airway pressure) dependence (Z99.89) 2. History of hypercholesterolemia (Z86.39) 3. History of Obstructive sleep apnea, adult (G47.33)  Surgical History Problems  1. History of Cystoscopy With Insertion Of Ureteral Stent Left  Current Meds 1. Atorvastatin Calcium 10 MG Oral Tablet;  Therapy: (Recorded:23Nov2016) to Recorded 2. Multi-Vitamin Oral Tablet;  Therapy: (Recorded:23Nov2016) to Recorded 3. Ondansetron HCl - 8 MG Oral Tablet;  Therapy: (Recorded:23Nov2016) to Recorded 4. OxyCODONE HCl TABS;  Therapy: (Recorded:23Nov2016) to Recorded 5. Vitamin D 50000 UNIT CAPS;  Therapy: (Recorded:23Nov2016) to Recorded  Allergies Medication  1. No Known Drug Allergies  Family History Problems  1. Family history of renal failure (Z84.1) : Uncle  Social History Problems    Denied: History of Alcohol use   Caffeine use (F15.90)   Never a smoker   Number of children   Occupation   Separated from significant other (Z63.5)  Review of Systems Genitourinary, constitutional, skin, eye, otolaryngeal, hematologic/lymphatic, cardiovascular, pulmonary, endocrine, musculoskeletal,  gastrointestinal, neurological and psychiatric system(s) were reviewed and pertinent findings if present are noted and are otherwise negative.  Gastrointestinal:1  nausea1  and vomiting1 .     1 Amended By: Festus Aloe; May 20 2015 10:06 AM EST  Vitals Vital Signs [Data Includes: Last 1 Day]  Recorded: [id number] 08:39AM  Height: 5 ft 5 in Weight: 205 lb  BMI Calculated: 34.11 BSA Calculated: 2 Blood Pressure: 156 / 97 Temperature: 97.7 F Heart Rate: 77  Physical Exam Constitutional: Well nourished and well developed . No acute distress.  Cardiovascular: Heart rate and rhythm are normal . No peripheral edema.  Abdomen: The abdomen is soft and nontender. No masses are palpated. No CVA tenderness. No hernias are palpable. No hepatosplenomegaly noted.  Neuro/Psych:. Mood and affect are appropriate.    Results/Data Urine [Data Includes: Last 1 Day]   [id number]  COLOR YELLOW   APPEARANCE CLOUDY   SPECIFIC GRAVITY 1.020   pH 5.5   GLUCOSE NEGATIVE   BILIRUBIN NEGATIVE   KETONE NEGATIVE   BLOOD 3+   PROTEIN 1+   NITRITE NEGATIVE   LEUKOCYTE ESTERASE TRACE   SQUAMOUS EPITHELIAL/HPF 0-5 HPF  WBC 6-10 WBC/HPF  RBC 20-40 RBC/HPF  BACTERIA NONE SEEN HPF  CRYSTALS NONE SEEN HPF  CASTS NONE SEEN LPF  Yeast NONE SEEN HPF   Old records or history reviewed: epic - hospital and ED notes.  The following images/tracing/specimen were independently visualized:  CT and renal U/S.    Assessment Assessed  1. Nephrolithiasis (N20.0)  Plan Health Maintenance  1. UA With REFLEX; [Do Not Release]; Status:Complete;   [id number] 08:20AM Nephrolithiasis  2. Follow-up  Schedule Surgery Office  Follow-up  Status: Hold For - Appointment   Requested for: [phone] 3. URINE CULTURE; Status:Hold For - Specimen/Data Collection,Appointment; Requested  for:23Nov2016;   Discussion/Summary Left renal stones status post left ureteral stent-discussed with the patient the nature risks and  benefits of simply removing the stent, left ureteroscopy or shockwave lithotripsy. All questions answered. She would like to try to make it to ureteroscopy. I gave her some samples of Rapaflo and we discussed the off label use of alpha blockers for stent pain. I also gave her some samples of Vesicare. Discussed risk of antimuscarinics as well. Discussed right stones and surveillance.      Signatures Electronically signed by : Festus Aloe, M.D.; May 20 2015  9:04AM EST Electronically signed by : Festus Aloe, M.D.; May 20 2015 10:06AM EST  Addendum, H&P update: After seeing the patient last week she developed right flank pain.  CT scan was done in the ED which revealed a stone in right kidney had passed into the ureter and was in the right proximal ureter.  I reviewed the images today. She continues to have right flank pain now and has not passed a stone.   She started nitrofurantoin last night and took a dose this morning for a low colony count of enterococcus consistent with asymptomatic bacteriuria. She is AFVSS today. I added Levaquin to her antibiotic regimen.   Discussed with patient, her husband and father the I discussed with the patient the nature, potential benefits, risks and alternatives to Bilateral ureteroscopy with holmium laser lithotripsy, stone basket extraction and ureteral stent placement, including side effects of the proposed treatment, the likelihood of the patient achieving the goals of the procedure, and any potential problems that might occur during the procedure or recuperation. All questions answered. Patient elects to proceed. Discussed she may need a staged procedure to deal with the right ureteral stone if retrograde access is difficult.  Also discussed postop stent placement.

## 2015-06-09 NOTE — Interval H&P Note (Signed)
History and Physical Interval Note:  06/09/2015 11:19 AM  I would like to add she was in the emergency department last week with right flank pain and a CT scan was done which showed both stents in good position. No obvious left renal stones with possibly a left lower pole Randles plaque. The stone at the right UPJ has dropped down into the right lower pole. Her vital signs were stable, her UA showed many white cells and red cells as expected with a stent and a few bacteria. Her white count was mildly elevated to 12.  No cx was sent.    [name] Sherman

## 2015-06-09 NOTE — Discharge Instructions (Signed)
Ureteral Stent Implantation, Care After Refer to this sheet in the next few weeks. These instructions provide you with information on caring for yourself after your procedure. Your health care provider may also give you more specific instructions. Your treatment has been planned according to current medical practices, but problems sometimes occur. Call your health care provider if you have any problems or questions after your procedure.  REMOVAL OF THE STENT: Remove the stent, Monday morning 06/15/2015 by pulling the string was slow steady pressure until the entire stent is removed. Start Levaquin the day the stent is removed prior to removal.  WHAT TO EXPECT AFTER THE PROCEDURE You should be back to normal activity within 48 hours after the procedure. Nausea and vomiting may occur and are commonly the result of anesthesia. It is common to experience sharp pain in the back or lower abdomen and penis with voiding. This is caused by movement of the ends of the stent with the act of urinating.It usually goes away within minutes after you have stopped urinating. HOME CARE INSTRUCTIONS Make sure to drink plenty of fluids. You may have small amounts of bleeding, causing your urine to be red. This is normal. Certain movements may trigger pain or a feeling that you need to urinate. You may be given medicines to prevent infection or bladder spasms. Be sure to take all medicines as directed. Only take over-the-counter or prescription medicines for pain, discomfort, or fever as directed by your health care provider.   Be sure to keep all follow-up appointments so your health care provider can check that you are healing properly.  SEEK MEDICAL CARE IF:  You experience increasing pain.  Your pain medicine is not working. SEEK IMMEDIATE MEDICAL CARE IF:  Your urine is dark red or has blood clots.  You are leaking urine (incontinent).  You have a fever, chills, feeling sick to your stomach (nausea), or  vomiting.  Your pain is not relieved by pain medicine.  The end of the stent comes out of the urethra.  You are unable to urinate.   This information is not intended to replace advice given to you by your health care provider. Make sure you discuss any questions you have with your health care provider.   Document Released: 02/13/2013 Document Revised: 06/18/2013 Document Reviewed: 12/26/2014 Elsevier Interactive Patient Education 2016 [place] Anesthesia, Adult, Care After Refer to this sheet in the next few weeks. These instructions provide you with information on caring for yourself after your procedure. Your health care provider may also give you more specific instructions. Your treatment has been planned according to current medical practices, but problems sometimes occur. Call your health care provider if you have any problems or questions after your procedure. WHAT TO EXPECT AFTER THE PROCEDURE After the procedure, it is typical to experience:  Sleepiness.  Nausea and vomiting. HOME CARE INSTRUCTIONS  For the first 24 hours after general anesthesia:  Have a responsible person with you.  Do not drive a car. If you are alone, do not take public transportation.  Do not drink alcohol.  Do not take medicine that has not been prescribed by your health care provider.  Do not sign important papers or make important decisions.  You may resume a normal diet and activities as directed by your health care provider.  Change bandages (dressings) as directed.  If you have questions or problems that seem related to general anesthesia, call the hospital and ask for the anesthetist  or anesthesiologist on call. SEEK MEDICAL CARE IF:  You have nausea and vomiting that continue the day after anesthesia.  You develop a rash. SEEK IMMEDIATE MEDICAL CARE IF:   You have difficulty breathing.  You have chest pain.  You have any allergic problems.   This information  is not intended to replace advice given to you by your health care provider. Make sure you discuss any questions you have with your health care provider.   Document Released: 09/19/2000 Document Revised: 07/04/2014 Document Reviewed: 10/12/2011 Elsevier Interactive Patient Education [employer].

## 2015-06-09 NOTE — Anesthesia Postprocedure Evaluation (Signed)
Anesthesia Post Note  Patient: [name] Sherman  Procedure(s) Performed: Procedure(s) (LRB): CYSTOSCOPY/URETEROSCOPY/STENT PLACEMENT REMOVAL LEFT URETERAL STENT (Right) HOLMIUM LASER APPLICATION (Right)  Patient location during evaluation: PACU Anesthesia Type: General Level of consciousness: awake and alert Pain management: pain level controlled Vital Signs Assessment: post-procedure vital signs reviewed and stable Respiratory status: spontaneous breathing, nonlabored ventilation, respiratory function stable and patient connected to nasal cannula oxygen Cardiovascular status: blood pressure returned to baseline and stable Postop Assessment: no signs of nausea or vomiting Anesthetic complications: no    Last Vitals:  Filed Vitals:   06/09/15 1422 06/09/15 1543  BP: 136/73 124/74  Pulse: 57 79  Temp: 36.6 C 36.4 C  Resp: 12 16    Last Pain:  Filed Vitals:   06/09/15 1559  PainSc: 2                  Graycen Sadlon J

## 2015-06-09 NOTE — Interval H&P Note (Signed)
History and Physical Interval Note:  06/09/2015 11:15 AM  [name] Sherman  has presented today for surgery, with the diagnosis of RIGHT URETERAL STONE  The various methods of treatment have been discussed with the patient and family. After consideration of risks, benefits and other options for treatment, the patient has consented to  Procedure(s) with comments: CYSTOSCOPY/URETEROSCOPY/HOLMIUM LASER/STENT PLACEMENT (Right) - 1 HR   HOLMIUM LASER APPLICATION (Right) and LEFT ureteral stent removal as a surgical intervention .  The patient's history has been reviewed, patient examined, no change in status, stable for surgery. She has been well. She has been working. She's had some dysuria from the stents but she's had no fever. She continues nitrofurantoin. I have reviewed the patient's chart and labs.  Questions were answered to the patient's satisfaction.     Nathen Balaban

## 2015-06-09 NOTE — Op Note (Signed)
Preoperative diagnosis: Left ureteral stent, right ureteral stent, right renal stone Postoperative diagnosis: Right renal stone  Procedure: Cystoscopy, left ureteral stent removal, right ureteroscopy, holmium laser lithotripsy, stone basket extraction and right ureteral stent exchange  Surgeon: Junious Silk  Anesthesia: Gen.  Indication for procedure: Patient underwent a left ureteroscopy and attempted right ureteroscopy for bilateral UPJ stones in a staged fashion. She has very narrow ureters especially proximally. She presents today for left stent removal and definitive right stone management. Of note she went to the ED and CT shows the stone dropped into the right lower pole.  Findings: On cystoscopy the ureteral stents were retained and the left ureteral stent was removed without difficulty. On right ureteroscopy the right ureter was normal but again quite tight proximally. The stone was located in the right lower pole and required fragmentation for removal.  Description of procedure: After consent was obtained patient brought to the operating room. After adequate anesthesia she was placed in lithotomy position and prepped and draped in the usual sterile fashion. A timeout was performed to confirm the patient and procedure. The cystoscope was passed per urethra and the bladder was rinsed 4. The left ureteral stent grasped and removed through the urethral meatus. The right ureteral stent was grasped and removed through the urethral meatus through which a sensor wire was passed and coiled in the collecting system. The ureteral access sheath was passed to place 2 wires. The access sheath was then passed over one of the wires and the single-channel digital ureteroscope was passed. The proximal ureter was still quite tight to allow passage of the ureteroscope. Stone was located in the lower pole and grasped with a [employer]. The stone was too large to fit through the proximal ureter despite its small  size and pre-stenting of the ureter. Therefore I dropped the stone in the upper pole for ease of fragmentation and broke it up with the 200  laser fiber at a setting of 0.8 and 8. The pieces were sequentially removed with no fragments remaining apart from possible 1 or 2 half millimeter pieces which are too small to pickup the basket. I then inspected the collecting system again and noted to be normal, the renal pelvis appeared normal, the proximal ureter again quite tight with some mucosal abrasion from either scope or wire compression. The scope and the access sheath were backed out together and the ureter noted to be normal without injury or stone fragment. The wire was backloaded on the cystoscope and a 6 x 26 cm ureteral stent was advanced. The wire was removed with a good coil seen in the kidney and a good coil in the bladder. The bladder was drained and the scope removed. She was awakened and taken to recovery room in stable condition.  Complications: None  Blood loss: Minimal  Specimens: Stone fragments to office lab  Drains: 6 x 26 cm right ureteral stent with string

## 2015-06-09 NOTE — Progress Notes (Signed)
Patient and husband instructed in removal of stent early 06/15/15. Patient is to take levaquin 06/15/15 prior to removal of stent. Patient is to stand in shower to remove stent. String is taped to right thigh. Reviewed removal and what to do if string is pulled by mistake.

## 2015-06-09 NOTE — Transfer of Care (Signed)
Immediate Anesthesia Transfer of Care Note  Patient: [name] Sherman  Procedure(s) Performed: Procedure(s) with comments: CYSTOSCOPY/URETEROSCOPY/STENT PLACEMENT REMOVAL LEFT URETERAL STENT (Right) - 1 HR   HOLMIUM LASER APPLICATION (Right)  Patient Location: PACU  Anesthesia Type:General  Level of Consciousness: awake, alert , oriented and patient cooperative  Airway & Oxygen Therapy: Patient Spontanous Breathing and Patient connected to face mask oxygen  Post-op Assessment: Report given to RN, Post -op Vital signs reviewed and stable and Patient moving all extremities X 4  Post vital signs: stable  Last Vitals:  Filed Vitals:   06/09/15 0812  BP: 142/78  Pulse: 92  Temp: 36.6 C  Resp: 18    Complications: No apparent anesthesia complications

## 2015-06-10 ENCOUNTER — Encounter (HOSPITAL_COMMUNITY): Payer: Self-pay | Admitting: Urology

## 2015-07-06 ENCOUNTER — Ambulatory Visit (INDEPENDENT_AMBULATORY_CARE_PROVIDER_SITE_OTHER): Payer: BLUE CROSS/BLUE SHIELD | Admitting: Family Medicine

## 2015-07-06 ENCOUNTER — Encounter: Payer: Self-pay | Admitting: Family Medicine

## 2015-07-06 VITALS — BP 168/100 | HR 99 | Temp 98.1°F | Resp 16 | Ht 65.0 in | Wt 205.1 lb

## 2015-07-06 DIAGNOSIS — R030 Elevated blood-pressure reading, without diagnosis of hypertension: Secondary | ICD-10-CM

## 2015-07-06 DIAGNOSIS — Z0000 Encounter for general adult medical examination without abnormal findings: Secondary | ICD-10-CM

## 2015-07-06 DIAGNOSIS — Z0001 Encounter for general adult medical examination with abnormal findings: Secondary | ICD-10-CM

## 2015-07-06 DIAGNOSIS — IMO0001 Reserved for inherently not codable concepts without codable children: Secondary | ICD-10-CM

## 2015-07-06 LAB — LIPID PANEL
CHOLESTEROL: 253 mg/dL — AB (ref 0–200)
HDL: 62.2 mg/dL (ref >39.00)
LDL Cholesterol: 165 mg/dL — ABNORMAL HIGH (ref 0–99)
NONHDL: 191.05
Total CHOL/HDL Ratio: 4
Triglycerides: 129 mg/dL (ref 0.0–149.0)
VLDL: 25.8 mg/dL (ref 0.0–40.0)

## 2015-07-06 LAB — CBC WITH DIFFERENTIAL/PLATELET
BASOS PCT: 0.9 % (ref 0.0–3.0)
Basophils Absolute: 0.1 10*3/uL (ref 0.0–0.1)
EOS PCT: 3.6 % (ref 0.0–5.0)
Eosinophils Absolute: 0.3 10*3/uL (ref 0.0–0.7)
HEMATOCRIT: 43.6 % (ref 36.0–46.0)
HEMOGLOBIN: 14.3 g/dL (ref 12.0–15.0)
Lymphocytes Relative: 34.4 % (ref 12.0–46.0)
Lymphs Abs: 3.3 10*3/uL (ref 0.7–4.0)
MCHC: 32.7 g/dL (ref 30.0–36.0)
MCV: 83.8 fl (ref 78.0–100.0)
MONOS PCT: 6.5 % (ref 3.0–12.0)
Monocytes Absolute: 0.6 10*3/uL (ref 0.1–1.0)
Neutro Abs: 5.2 10*3/uL (ref 1.4–7.7)
Neutrophils Relative %: 54.6 % (ref 43.0–77.0)
Platelets: 327 10*3/uL (ref 150.0–400.0)
RBC: 5.2 Mil/uL — AB (ref 3.87–5.11)
RDW: 14 % (ref 11.5–15.5)
WBC: 9.5 10*3/uL (ref 4.0–10.5)

## 2015-07-06 LAB — BASIC METABOLIC PANEL
BUN: 12 mg/dL (ref 6–23)
CALCIUM: 9.5 mg/dL (ref 8.4–10.5)
CO2: 31 mEq/L (ref 19–32)
Chloride: 105 mEq/L (ref 96–112)
Creatinine, Ser: 0.77 mg/dL (ref 0.40–1.20)
GFR: 83.43 mL/min (ref >60.00)
GLUCOSE: 96 mg/dL (ref 70–99)
POTASSIUM: 4.6 meq/L (ref 3.5–5.1)
SODIUM: 143 meq/L (ref 135–145)

## 2015-07-06 LAB — TSH: TSH: 4.15 u[IU]/mL (ref 0.35–4.50)

## 2015-07-06 LAB — HEPATIC FUNCTION PANEL
ALT: 26 U/L (ref 0–35)
AST: 18 U/L (ref 0–37)
Albumin: 4 g/dL (ref 3.5–5.2)
Alkaline Phosphatase: 113 U/L (ref 39–117)
BILIRUBIN DIRECT: 0.1 mg/dL (ref 0.0–0.3)
BILIRUBIN TOTAL: 0.5 mg/dL (ref 0.2–1.2)
Total Protein: 6.8 g/dL (ref 6.0–8.3)

## 2015-07-06 NOTE — Patient Instructions (Signed)
Set up repeat mammogram  

## 2015-07-06 NOTE — Progress Notes (Signed)
Pre visit review using our clinic review tool, if applicable. No additional management support is needed unless otherwise documented below in the visit note. 

## 2015-07-06 NOTE — Progress Notes (Signed)
Subjective:    Patient ID: [name] Sherman, female    DOB: 12-03-1960, 55 y.o.   MRN: [id number]  HPI Patient here for physical She's had some recent issues with kidney stones which required surgery. She's had previous hysterectomy for benign disease. No history of hypertension. She has attention deficit disorder and hyperlipidemia. Medications reviewed. Did not take her Adderall this morning.  No specific risk factors for hepatitis C  Past Medical History  Diagnosis Date  . Migraine   . Vitamin D deficiency   . Hyperlipidemia   . Umbilical hernia without obstruction or gangrene   . Urgency of urination   . Left ureteral stone   . Nephrolithiasis     bilateral nonobstructive per ct  . History of cervical cancer     s/p  laser ablation of cervix 1980's  . ADD (attention deficit disorder)   . OSA (obstructive sleep apnea)     pt used cpap up until 2013 states lost wt and did not need anymore   Past Surgical History  Procedure Laterality Date  . Bunionectomy Right 2004  . Cystoscopy with retrograde pyelogram, ureteroscopy and stent placement Left 05/01/2015    Procedure: CYSTOSCOPY WITH RETROGRADE PYELOGRAM AND STENT PLACEMENT;  Surgeon: Festus Aloe, MD;  Location: [facility];  Service: Urology;  Laterality: Left;  . Laser ablation of the cervix  x2  1980's  . Tubal ligation  1980's  . Laparoscopic assisted vaginal hysterectomy  04-23-2002    and Left Salpingoophorectomy/  Anterior Repair/  Tension Free Tape Sling placement  . Cystoscopy with stent placement Bilateral 05/26/2015    Procedure: CYSTOSCOPY WITH STENT PLACEMENT;  Surgeon: Festus Aloe, MD;  Location: [facility];  Service: Urology;  Laterality: Bilateral;  . Cystoscopy w/ ureteral stent removal Left 05/26/2015    Procedure: CYSTOSCOPY WITH STENT REMOVAL;  Surgeon: Festus Aloe, MD;  Location: [facility];  Service: Urology;  Laterality: Left;  . Cystoscopy w/ retrogrades  Bilateral 05/26/2015    Procedure: CYSTOSCOPY WITH RETROGRADE PYELOGRAM;  Surgeon: Festus Aloe, MD;  Location: [facility];  Service: Urology;  Laterality: Bilateral;  . Cystoscopy with ureteroscopy Bilateral 05/26/2015    Procedure: CYSTOSCOPY WITH URETEROSCOPY;  Surgeon: Festus Aloe, MD;  Location: [facility];  Service: Urology;  Laterality: Bilateral;  . Holmium laser application Left [id number]    Procedure: HOLMIUM LASER APPLICATION;  Surgeon: Festus Aloe, MD;  Location: [facility];  Service: Urology;  Laterality: Left;  . Abdominal hysterectomy    . Cystoscopy/ureteroscopy/holmium laser/stent placement Right 06/09/2015    Procedure: CYSTOSCOPY/URETEROSCOPY/STENT PLACEMENT REMOVAL LEFT URETERAL STENT;  Surgeon: Festus Aloe, MD;  Location: [facility];  Service: Urology;  Laterality: Right;  . Holmium laser application Right [id number]    Procedure: HOLMIUM LASER APPLICATION;  Surgeon: Festus Aloe, MD;  Location: [facility];  Service: Urology;  Laterality: Right;    reports that she has never smoked. She has never used smokeless tobacco. She reports that she does not drink alcohol or use illicit drugs. family history includes Alcohol abuse in her mother; Arthritis in her father; Cancer in her maternal grandmother and paternal grandmother; Heart disease in her father; Hyperlipidemia in her father; Hypertension in her father; Kidney disease in her paternal uncle. No Known Allergies    Review of Systems  Constitutional: Negative for fever, activity change, appetite change, fatigue and unexpected weight change.  HENT: Positive for congestion and sinus pressure. Negative for ear pain, hearing loss, sore  throat and trouble swallowing.   Eyes: Negative for visual disturbance.  Respiratory: Negative for cough and shortness of breath.   Cardiovascular: Negative for chest pain and palpitations.  Gastrointestinal: Negative for abdominal  pain, diarrhea, constipation and blood in stool.  Endocrine: Negative for polydipsia and polyuria.  Genitourinary: Negative for dysuria and hematuria.  Musculoskeletal: Negative for myalgias, back pain and arthralgias.  Skin: Negative for rash.  Neurological: Negative for dizziness, syncope and headaches.  Hematological: Negative for adenopathy.  Psychiatric/Behavioral: Negative for confusion and dysphoric mood.       Objective:   Physical Exam  Constitutional: She is oriented to person, place, and time. She appears well-developed and well-nourished.  HENT:  Head: Normocephalic and atraumatic.  Eyes: EOM are normal. Pupils are equal, round, and reactive to light.  Neck: Normal range of motion. Neck supple. No thyromegaly present.  Cardiovascular: Normal rate, regular rhythm and normal heart sounds.   No murmur heard. Pulmonary/Chest: Breath sounds normal. No respiratory distress. She has no wheezes. She has no rales.  Abdominal: Soft. Bowel sounds are normal. She exhibits no distension and no mass. There is no tenderness. There is no rebound and no guarding.  Musculoskeletal: Normal range of motion. She exhibits no edema.  Lymphadenopathy:    She has no cervical adenopathy.  Neurological: She is alert and oriented to person, place, and time. She displays normal reflexes. No cranial nerve deficit.  Skin: No rash noted.  Psychiatric: She has a normal mood and affect. Her behavior is normal. Judgment and thought content normal.          Assessment & Plan:  Physical exam. Set up repeat mammogram. Pap smear not indicated as she's had previous hysterectomy for benign disease. Obtain screening lab work. Patient requesting HIV. Will also check hepatitis C screen-no prior documented screening..  Blood pressure significantly elevated 168/100 right arm seated by my reading. She will return in one week for repeat reading. If still up at that point consider treatment. Discontinue Adderall at  this time until blood pressure controlled.

## 2015-07-07 LAB — HEPATITIS C ANTIBODY: HCV Ab: NEGATIVE

## 2015-07-07 LAB — HIV ANTIBODY (ROUTINE TESTING W REFLEX): HIV 1&2 Ab, 4th Generation: NONREACTIVE

## 2015-07-07 MED ORDER — ATORVASTATIN CALCIUM 10 MG PO TABS: 10.0000 mg | ORAL_TABLET | Freq: Every day | ORAL | Status: DC

## 2015-07-07 MED ORDER — CITALOPRAM HYDROBROMIDE 20 MG PO TABS: 20.0000 mg | ORAL_TABLET | Freq: Every day | ORAL | Status: DC

## 2015-07-07 NOTE — Addendum Note (Signed)
Addended by: Elio Forget on: 07/07/2015 11:01 AM   Modules accepted: Orders, Medications

## 2015-07-15 ENCOUNTER — Ambulatory Visit (INDEPENDENT_AMBULATORY_CARE_PROVIDER_SITE_OTHER): Payer: BLUE CROSS/BLUE SHIELD | Admitting: Family Medicine

## 2015-07-15 VITALS — BP 134/104 | HR 87 | Temp 98.1°F | Ht 65.0 in | Wt 208.0 lb

## 2015-07-15 DIAGNOSIS — R030 Elevated blood-pressure reading, without diagnosis of hypertension: Secondary | ICD-10-CM | POA: Diagnosis not present

## 2015-07-15 DIAGNOSIS — IMO0001 Reserved for inherently not codable concepts without codable children: Secondary | ICD-10-CM

## 2015-07-15 MED ORDER — AMPHETAMINE-DEXTROAMPHETAMINE 20 MG PO TABS: 20.0000 mg | ORAL_TABLET | Freq: Two times a day (BID) | ORAL | Status: DC

## 2015-07-15 NOTE — Progress Notes (Signed)
Pre visit review using our clinic review tool, if applicable. No additional management support is needed unless otherwise documented below in the visit note. 

## 2015-07-15 NOTE — Progress Notes (Signed)
Subjective:    Patient ID: [name] Sherman, female    DOB: 02-27-1961, 55 y.o.   MRN: [id number]  HPI Follow-up elevated blood pressure. Never treated for hypertension previously. She plans to start back exercises today. She had recent reading over 123456 systolic and over [id number] diastolic. She was opposed to medication and thus we brought her back today to reassess. She's not had any headaches and feels well overall. Did take some Advil this morning but does not take consistently. Rare alcohol use. Nonsmoker. Has not had any recent chest pains  Past Medical History  Diagnosis Date  . Migraine   . Vitamin D deficiency   . Hyperlipidemia   . Umbilical hernia without obstruction or gangrene   . Urgency of urination   . Left ureteral stone   . Nephrolithiasis     bilateral nonobstructive per ct  . History of cervical cancer     s/p  laser ablation of cervix 1980's  . ADD (attention deficit disorder)   . OSA (obstructive sleep apnea)     pt used cpap up until 2013 states lost wt and did not need anymore   Past Surgical History  Procedure Laterality Date  . Bunionectomy Right 2004  . Cystoscopy with retrograde pyelogram, ureteroscopy and stent placement Left 05/01/2015    Procedure: CYSTOSCOPY WITH RETROGRADE PYELOGRAM AND STENT PLACEMENT;  Surgeon: Festus Aloe, MD;  Location: [facility];  Service: Urology;  Laterality: Left;  . Laser ablation of the cervix  x2  1980's  . Tubal ligation  1980's  . Laparoscopic assisted vaginal hysterectomy  04-23-2002    and Left Salpingoophorectomy/  Anterior Repair/  Tension Free Tape Sling placement  . Cystoscopy with stent placement Bilateral 05/26/2015    Procedure: CYSTOSCOPY WITH STENT PLACEMENT;  Surgeon: Festus Aloe, MD;  Location: [facility];  Service: Urology;  Laterality: Bilateral;  . Cystoscopy w/ ureteral stent removal Left 05/26/2015    Procedure: CYSTOSCOPY WITH STENT REMOVAL;  Surgeon: Festus Aloe, MD;  Location:  [facility];  Service: Urology;  Laterality: Left;  . Cystoscopy w/ retrogrades Bilateral 05/26/2015    Procedure: CYSTOSCOPY WITH RETROGRADE PYELOGRAM;  Surgeon: Festus Aloe, MD;  Location: [facility];  Service: Urology;  Laterality: Bilateral;  . Cystoscopy with ureteroscopy Bilateral 05/26/2015    Procedure: CYSTOSCOPY WITH URETEROSCOPY;  Surgeon: Festus Aloe, MD;  Location: [facility];  Service: Urology;  Laterality: Bilateral;  . Holmium laser application Left [id number]    Procedure: HOLMIUM LASER APPLICATION;  Surgeon: Festus Aloe, MD;  Location: [facility];  Service: Urology;  Laterality: Left;  . Abdominal hysterectomy    . Cystoscopy/ureteroscopy/holmium laser/stent placement Right 06/09/2015    Procedure: CYSTOSCOPY/URETEROSCOPY/STENT PLACEMENT REMOVAL LEFT URETERAL STENT;  Surgeon: Festus Aloe, MD;  Location: [facility];  Service: Urology;  Laterality: Right;  . Holmium laser application Right 0000000    Procedure: HOLMIUM LASER APPLICATION;  Surgeon: Festus Aloe, MD;  Location: [facility];  Service: Urology;  Laterality: Right;    reports that she has never smoked. She has never used smokeless tobacco. She reports that she does not drink alcohol or use illicit drugs. family history includes Alcohol abuse in her mother; Arthritis in her father; Cancer in her maternal grandmother and paternal grandmother; Heart disease in her father; Hyperlipidemia in her father; Hypertension in her father; Kidney disease in her paternal uncle. No Known Allergies    Review of Systems  Constitutional: Negative for fatigue.  Eyes:  Negative for visual disturbance.  Respiratory: Negative for cough, chest tightness, shortness of breath and wheezing.   Cardiovascular: Negative for chest pain, palpitations and leg swelling.  Neurological: Negative for dizziness, seizures, syncope, weakness, light-headedness and headaches.         Objective:   Physical Exam  Constitutional: She appears well-developed and well-nourished.  Neck: Neck supple. No thyromegaly present.  Cardiovascular: Normal rate and regular rhythm.  Exam reveals no gallop.   No murmur heard. Pulmonary/Chest: Effort normal and breath sounds normal. No respiratory distress. She has no wheezes. She has no rales.  Musculoskeletal: She exhibits no edema.  Lymphadenopathy:    She has no cervical adenopathy.          Assessment & Plan:  Elevated blood pressure. Systolic pressure is greatly improved today but diastolic is still up. She is very opposed to medication. We agreed to give her 2 month trial of weight loss and regular aerobic exercise. She states she is already following a fairly low sodium diet. She will also monitor blood pressure periodically through local [facility]

## 2015-07-20 LAB — HM MAMMOGRAPHY: HM MAMMO: NEGATIVE

## 2015-07-22 ENCOUNTER — Encounter: Payer: Self-pay | Admitting: Family Medicine

## 2015-08-18 ENCOUNTER — Telehealth: Payer: Self-pay | Admitting: Family Medicine

## 2015-08-18 NOTE — Telephone Encounter (Signed)
Pt needs new rx generic adderall 20 mg #60. Pt has an appt next month. Pt wanted dr to know she has been monitoring her bp and last read was 138/76

## 2015-08-19 MED ORDER — AMPHETAMINE-DEXTROAMPHETAMINE 20 MG PO TABS: 20.0000 mg | ORAL_TABLET | Freq: Two times a day (BID) | ORAL | Status: DC

## 2015-08-19 NOTE — Telephone Encounter (Signed)
Please review BP reading. Okay to print x3 or just 1 RX?

## 2015-08-19 NOTE — Telephone Encounter (Signed)
Pt is aware via voicemail that all medications are up front for pick up.

## 2015-08-19 NOTE — Telephone Encounter (Signed)
Printed for signature

## 2015-08-19 NOTE — Telephone Encounter (Signed)
Refill times three. 

## 2015-09-09 ENCOUNTER — Ambulatory Visit: Payer: Self-pay | Admitting: Family Medicine

## 2015-09-11 ENCOUNTER — Encounter: Payer: Managed Care, Other (non HMO) | Admitting: Family Medicine

## 2015-09-11 NOTE — Progress Notes (Signed)
Pre visit review using our clinic review tool, if applicable. No additional management support is needed unless otherwise documented below in the visit note. 

## 2015-09-13 NOTE — Progress Notes (Signed)
This encounter was created in error - please disregard.

## 2015-09-14 ENCOUNTER — Ambulatory Visit: Payer: BLUE CROSS/BLUE SHIELD | Admitting: Family Medicine

## 2015-09-21 ENCOUNTER — Ambulatory Visit (INDEPENDENT_AMBULATORY_CARE_PROVIDER_SITE_OTHER): Payer: Managed Care, Other (non HMO) | Admitting: Family Medicine

## 2015-09-21 VITALS — BP 132/96 | HR 94 | Temp 98.4°F | Ht 65.0 in | Wt 209.9 lb

## 2015-09-21 DIAGNOSIS — I10 Essential (primary) hypertension: Secondary | ICD-10-CM | POA: Diagnosis not present

## 2015-09-21 MED ORDER — LOSARTAN POTASSIUM 50 MG PO TABS: 50.0000 mg | ORAL_TABLET | Freq: Every day | ORAL | Status: DC

## 2015-09-21 NOTE — Progress Notes (Signed)
Pre visit review using our clinic review tool, if applicable. No additional management support is needed unless otherwise documented below in the visit note. 

## 2015-09-21 NOTE — Patient Instructions (Signed)
DASH Eating Plan  DASH stands for "Dietary Approaches to Stop Hypertension." The DASH eating plan is a healthy eating plan that has been shown to reduce high blood pressure (hypertension). Additional health benefits may include reducing the risk of type 2 diabetes mellitus, heart disease, and stroke. The DASH eating plan may also help with weight loss.  WHAT DO I NEED TO KNOW ABOUT THE DASH EATING PLAN?  For the DASH eating plan, you will follow these general guidelines:  · Choose foods with a percent daily value for sodium of less than 5% (as listed on the food label).  · Use salt-free seasonings or herbs instead of table salt or sea salt.  · Check with your health care provider or pharmacist before using salt substitutes.  · Eat lower-sodium products, often labeled as "lower sodium" or "no salt added."  · Eat fresh foods.  · Eat more vegetables, fruits, and low-fat dairy products.  · Choose whole grains. Look for the word "whole" as the first word in the ingredient list.  · Choose fish and skinless chicken or turkey more often than red meat. Limit fish, poultry, and meat to 6 oz (170 g) each day.  · Limit sweets, desserts, sugars, and sugary drinks.  · Choose heart-healthy fats.  · Limit cheese to 1 oz (28 g) per day.  · Eat more home-cooked food and less restaurant, buffet, and fast food.  · Limit fried foods.  · Cook foods using methods other than frying.  · Limit canned vegetables. If you do use them, rinse them well to decrease the sodium.  · When eating at a restaurant, ask that your food be prepared with less salt, or no salt if possible.  WHAT FOODS CAN I EAT?  Seek help from a dietitian for individual calorie needs.  Grains  Whole grain or whole wheat bread. Brown rice. Whole grain or whole wheat pasta. Quinoa, bulgur, and whole grain cereals. Low-sodium cereals. Corn or whole wheat flour tortillas. Whole grain cornbread. Whole grain crackers. Low-sodium crackers.  Vegetables  Fresh or frozen vegetables  (raw, steamed, roasted, or grilled). Low-sodium or reduced-sodium tomato and vegetable juices. Low-sodium or reduced-sodium tomato sauce and paste. Low-sodium or reduced-sodium canned vegetables.   Fruits  All fresh, canned (in natural juice), or frozen fruits.  Meat and Other Protein Products  Ground beef (85% or leaner), grass-fed beef, or beef trimmed of fat. Skinless chicken or turkey. Ground chicken or turkey. Pork trimmed of fat. All fish and seafood. Eggs. Dried beans, peas, or lentils. Unsalted nuts and seeds. Unsalted canned beans.  Dairy  Low-fat dairy products, such as skim or 1% milk, 2% or reduced-fat cheeses, low-fat ricotta or cottage cheese, or plain low-fat yogurt. Low-sodium or reduced-sodium cheeses.  Fats and Oils  Tub margarines without trans fats. Light or reduced-fat mayonnaise and salad dressings (reduced sodium). Avocado. Safflower, olive, or canola oils. Natural peanut or almond butter.  Other  Unsalted popcorn and pretzels.  The items listed above may not be a complete list of recommended foods or beverages. Contact your dietitian for more options.  WHAT FOODS ARE NOT RECOMMENDED?  Grains  White bread. White pasta. White rice. Refined cornbread. Bagels and croissants. Crackers that contain trans fat.  Vegetables  Creamed or fried vegetables. Vegetables in a cheese sauce. Regular canned vegetables. Regular canned tomato sauce and paste. Regular tomato and vegetable juices.  Fruits  Dried fruits. Canned fruit in light or heavy syrup. Fruit juice.  Meat and Other Protein   Products  Fatty cuts of meat. Ribs, chicken wings, bacon, sausage, bologna, salami, chitterlings, fatback, hot dogs, bratwurst, and packaged luncheon meats. Salted nuts and seeds. Canned beans with salt.  Dairy  Whole or 2% milk, cream, half-and-half, and cream cheese. Whole-fat or sweetened yogurt. Full-fat cheeses or blue cheese. Nondairy creamers and whipped toppings. Processed cheese, cheese spreads, or cheese  curds.  Condiments  Onion and garlic salt, seasoned salt, table salt, and sea salt. Canned and packaged gravies. Worcestershire sauce. Tartar sauce. Barbecue sauce. Teriyaki sauce. Soy sauce, including reduced sodium. Steak sauce. Fish sauce. Oyster sauce. Cocktail sauce. Horseradish. Ketchup and mustard. Meat flavorings and tenderizers. Bouillon cubes. Hot sauce. Tabasco sauce. Marinades. Taco seasonings. Relishes.  Fats and Oils  Butter, stick margarine, lard, shortening, ghee, and bacon fat. Coconut, palm kernel, or palm oils. Regular salad dressings.  Other  Pickles and olives. Salted popcorn and pretzels.  The items listed above may not be a complete list of foods and beverages to avoid. Contact your dietitian for more information.  WHERE CAN I FIND MORE INFORMATION?  National Heart, Lung, and [employer] Institute: www.nhlbi.nih.gov/health/health-topics/topics/dash/     This information is not intended to replace advice given to you by your health care provider. Make sure you discuss any questions you have with your health care provider.     Document Released: 06/02/2011 Document Revised: 07/04/2014 Document Reviewed: 04/17/2013  Elsevier Interactive Patient Education ©2016 Elsevier Inc[employer]

## 2015-09-21 NOTE — Progress Notes (Signed)
Subjective:    Patient ID: [name] Sherman, female    DOB: 03-03-1961, 55 y.o.   MRN: [id number]  HPI  follow-up hypertension/elevated blood pressure. Refer to recent note.  Patient has been monitoring since then has had several systolic readings over [id number] with diastolic range up around 90. She's not made any lifestyle changes since then. Plans to start exercising more soon. Weight is unchanged. Has not been treated for hypertension previously. No headaches. No dizziness.  Past Medical History  Diagnosis Date  . Migraine   . Vitamin D deficiency   . Hyperlipidemia   . Umbilical hernia without obstruction or gangrene   . Urgency of urination   . Left ureteral stone   . Nephrolithiasis     bilateral nonobstructive per ct  . History of cervical cancer     s/p  laser ablation of cervix 1980's  . ADD (attention deficit disorder)   . OSA (obstructive sleep apnea)     pt used cpap up until 2013 states lost wt and did not need anymore   Past Surgical History  Procedure Laterality Date  . Bunionectomy Right 2004  . Cystoscopy with retrograde pyelogram, ureteroscopy and stent placement Left 05/01/2015    Procedure: CYSTOSCOPY WITH RETROGRADE PYELOGRAM AND STENT PLACEMENT;  Surgeon: Festus Aloe, MD;  Location: [facility];  Service: Urology;  Laterality: Left;  . Laser ablation of the cervix  x2  1980's  . Tubal ligation  1980's  . Laparoscopic assisted vaginal hysterectomy  04-23-2002    and Left Salpingoophorectomy/  Anterior Repair/  Tension Free Tape Sling placement  . Cystoscopy with stent placement Bilateral 05/26/2015    Procedure: CYSTOSCOPY WITH STENT PLACEMENT;  Surgeon: Festus Aloe, MD;  Location: [facility];  Service: Urology;  Laterality: Bilateral;  . Cystoscopy w/ ureteral stent removal Left 05/26/2015    Procedure: CYSTOSCOPY WITH STENT REMOVAL;  Surgeon: Festus Aloe, MD;  Location: [facility];  Service: Urology;  Laterality: Left;    . Cystoscopy w/ retrogrades Bilateral 05/26/2015    Procedure: CYSTOSCOPY WITH RETROGRADE PYELOGRAM;  Surgeon: Festus Aloe, MD;  Location: [facility];  Service: Urology;  Laterality: Bilateral;  . Cystoscopy with ureteroscopy Bilateral 05/26/2015    Procedure: CYSTOSCOPY WITH URETEROSCOPY;  Surgeon: Festus Aloe, MD;  Location: [facility];  Service: Urology;  Laterality: Bilateral;  . Holmium laser application Left [id number]    Procedure: HOLMIUM LASER APPLICATION;  Surgeon: Festus Aloe, MD;  Location: [facility];  Service: Urology;  Laterality: Left;  . Abdominal hysterectomy    . Cystoscopy/ureteroscopy/holmium laser/stent placement Right 06/09/2015    Procedure: CYSTOSCOPY/URETEROSCOPY/STENT PLACEMENT REMOVAL LEFT URETERAL STENT;  Surgeon: Festus Aloe, MD;  Location: [facility];  Service: Urology;  Laterality: Right;  . Holmium laser application Right 0000000    Procedure: HOLMIUM LASER APPLICATION;  Surgeon: Festus Aloe, MD;  Location: [facility];  Service: Urology;  Laterality: Right;    reports that she has never smoked. She has never used smokeless tobacco. She reports that she does not drink alcohol or use illicit drugs. family history includes Alcohol abuse in her mother; Arthritis in her father; Cancer in her maternal grandmother and paternal grandmother; Heart disease in her father; Hyperlipidemia in her father; Hypertension in her father; Kidney disease in her paternal uncle. No Known Allergies    Review of Systems  Constitutional: Negative for fatigue.  Eyes: Negative for visual disturbance.  Respiratory: Negative for cough, chest tightness, shortness of breath  and wheezing.   Cardiovascular: Negative for chest pain, palpitations and leg swelling.  Neurological: Negative for dizziness, seizures, syncope, weakness, light-headedness and headaches.       Objective:   Physical Exam  Constitutional: She appears  well-developed and well-nourished.  Neck: Neck supple. No thyromegaly present.  Cardiovascular: Normal rate and regular rhythm.   Pulmonary/Chest: Effort normal and breath sounds normal. No respiratory distress. She has no wheezes. She has no rales.  Musculoskeletal: She exhibits no edema.          Assessment & Plan:   Hypertension. Currently untreated. Multiple elevated readings as above. We discussed lifestyle management. With elected to start losartan 50 mg once daily. Bring back to reassess blood pressure in one month. We reviewed potential side effects.

## 2015-09-24 ENCOUNTER — Telehealth: Payer: Self-pay | Admitting: Family Medicine

## 2015-09-24 NOTE — Telephone Encounter (Signed)
Pt is traveling to [place] tomorrow and needs approval to get her adderall filled early. Pt will stay in [place] for 3 weeks amphetamine-dextroamphetamine (ADDERALL) 20 MG tablet  Pt would like asap, she is flying out at 12:30 tomorrow   [name] Sherman

## 2015-09-25 NOTE — Telephone Encounter (Signed)
Last refill pt had was 07/31/2015 at [facility]. Due to pt going out of town they should be able to refill this early but she is aware that insurance may not cover. Pt is aware of this via voicemail.

## 2015-10-19 ENCOUNTER — Ambulatory Visit (INDEPENDENT_AMBULATORY_CARE_PROVIDER_SITE_OTHER): Payer: Managed Care, Other (non HMO) | Admitting: Family Medicine

## 2015-10-19 VITALS — BP 142/92 | HR 100 | Temp 98.1°F | Ht 65.0 in | Wt 200.3 lb

## 2015-10-19 DIAGNOSIS — I10 Essential (primary) hypertension: Secondary | ICD-10-CM | POA: Diagnosis not present

## 2015-10-19 MED ORDER — LOSARTAN POTASSIUM-HCTZ 50-12.5 MG PO TABS: 1.0000 | ORAL_TABLET | Freq: Every day | ORAL | Status: DC

## 2015-10-19 NOTE — Progress Notes (Signed)
Subjective:    Patient ID: [name] Sherman, female    DOB: [birth year], 55 y.o.   MRN: [id number]  HPI Patient seen for follow-up hypertension Recently initiated losartan 50 mg once daily Patient is tolerated well without side effects. However, she does not feel this helped her blood pressure much. By home readings several readings [id number] systolic and 0000000 diastolic. She has made effort to lose some weight. No excessive alcohol use. Dealing with stress that her mom was just diagnosed with cancer  Past Medical History  Diagnosis Date  . Migraine   . Vitamin D deficiency   . Hyperlipidemia   . Umbilical hernia without obstruction or gangrene   . Urgency of urination   . Left ureteral stone   . Nephrolithiasis     bilateral nonobstructive per ct  . History of cervical cancer     s/p  laser ablation of cervix 1980's  . ADD (attention deficit disorder)   . OSA (obstructive sleep apnea)     pt used cpap up until 2013 states lost wt and did not need anymore   Past Surgical History  Procedure Laterality Date  . Bunionectomy Right 2004  . Cystoscopy with retrograde pyelogram, ureteroscopy and stent placement Left 05/01/2015    Procedure: CYSTOSCOPY WITH RETROGRADE PYELOGRAM AND STENT PLACEMENT;  Surgeon: Festus Aloe, MD;  Location: [facility];  Service: Urology;  Laterality: Left;  . Laser ablation of the cervix  x2  1980's  . Tubal ligation  1980's  . Laparoscopic assisted vaginal hysterectomy  04-23-2002    and Left Salpingoophorectomy/  Anterior Repair/  Tension Free Tape Sling placement  . Cystoscopy with stent placement Bilateral 05/26/2015    Procedure: CYSTOSCOPY WITH STENT PLACEMENT;  Surgeon: Festus Aloe, MD;  Location: [facility];  Service: Urology;  Laterality: Bilateral;  . Cystoscopy w/ ureteral stent removal Left 05/26/2015    Procedure: CYSTOSCOPY WITH STENT REMOVAL;  Surgeon: Festus Aloe, MD;  Location: [facility];  Service:  Urology;  Laterality: Left;  . Cystoscopy w/ retrogrades Bilateral 05/26/2015    Procedure: CYSTOSCOPY WITH RETROGRADE PYELOGRAM;  Surgeon: Festus Aloe, MD;  Location: [facility];  Service: Urology;  Laterality: Bilateral;  . Cystoscopy with ureteroscopy Bilateral 05/26/2015    Procedure: CYSTOSCOPY WITH URETEROSCOPY;  Surgeon: Festus Aloe, MD;  Location: [facility];  Service: Urology;  Laterality: Bilateral;  . Holmium laser application Left [id number]    Procedure: HOLMIUM LASER APPLICATION;  Surgeon: Festus Aloe, MD;  Location: [facility];  Service: Urology;  Laterality: Left;  . Abdominal hysterectomy    . Cystoscopy/ureteroscopy/holmium laser/stent placement Right 06/09/2015    Procedure: CYSTOSCOPY/URETEROSCOPY/STENT PLACEMENT REMOVAL LEFT URETERAL STENT;  Surgeon: Festus Aloe, MD;  Location: [facility];  Service: Urology;  Laterality: Right;  . Holmium laser application Right 0000000    Procedure: HOLMIUM LASER APPLICATION;  Surgeon: Festus Aloe, MD;  Location: [facility];  Service: Urology;  Laterality: Right;    reports that she has never smoked. She has never used smokeless tobacco. She reports that she does not drink alcohol or use illicit drugs. family history includes Alcohol abuse in her mother; Arthritis in her father; Cancer in her maternal grandmother and paternal grandmother; Heart disease in her father; Hyperlipidemia in her father; Hypertension in her father; Kidney disease in her paternal uncle. No Known Allergies    Review of Systems  Constitutional: Negative for unexpected weight change.  Eyes: Negative for visual disturbance.  Respiratory: Negative  for cough, chest tightness, shortness of breath and wheezing.   Cardiovascular: Negative for chest pain, palpitations and leg swelling.  Neurological: Negative for dizziness, seizures, syncope, weakness, light-headedness and headaches.       Objective:     Physical Exam  Constitutional: She appears well-developed and well-nourished.  Neck: Neck supple. No thyromegaly present.  Cardiovascular: Normal rate and regular rhythm.   Pulmonary/Chest: Effort normal and breath sounds normal. No respiratory distress. She has no wheezes. She has no rales.  Musculoskeletal: She exhibits no edema.          Assessment & Plan:  Hypertension. Minimally improved. Change losartan to losartan HCTZ 50/12.5 mg 1 daily. Continue weight loss efforts. Reassess one month. Check basic metabolic panel and fasting lipid panel then.  [name] MD [facility]

## 2015-10-19 NOTE — Progress Notes (Signed)
Pre visit review using our clinic review tool, if applicable. No additional management support is needed unless otherwise documented below in the visit note. 

## 2015-11-16 ENCOUNTER — Ambulatory Visit: Payer: Managed Care, Other (non HMO) | Admitting: Family Medicine

## 2015-11-17 ENCOUNTER — Ambulatory Visit (INDEPENDENT_AMBULATORY_CARE_PROVIDER_SITE_OTHER): Payer: Managed Care, Other (non HMO) | Admitting: Family Medicine

## 2015-11-17 VITALS — BP 110/90 | HR 94 | Temp 98.0°F | Ht 65.0 in | Wt 215.0 lb

## 2015-11-17 DIAGNOSIS — E785 Hyperlipidemia, unspecified: Secondary | ICD-10-CM

## 2015-11-17 DIAGNOSIS — I10 Essential (primary) hypertension: Secondary | ICD-10-CM

## 2015-11-17 LAB — BASIC METABOLIC PANEL
BUN: 13 mg/dL (ref 6–23)
CHLORIDE: 103 meq/L (ref 96–112)
CO2: 29 mEq/L (ref 19–32)
Calcium: 9.6 mg/dL (ref 8.4–10.5)
Creatinine, Ser: 0.86 mg/dL (ref 0.40–1.20)
GFR: 73.33 mL/min (ref >60.00)
GLUCOSE: 95 mg/dL (ref 70–99)
POTASSIUM: 4.2 meq/L (ref 3.5–5.1)
SODIUM: 139 meq/L (ref 135–145)

## 2015-11-17 LAB — LIPID PANEL
CHOLESTEROL: 201 mg/dL — AB (ref 0–200)
HDL: 55.2 mg/dL (ref >39.00)
LDL CALC: 120 mg/dL — AB (ref 0–99)
NONHDL: 145.42
Total CHOL/HDL Ratio: 4
Triglycerides: 127 mg/dL (ref 0.0–149.0)
VLDL: 25.4 mg/dL (ref 0.0–40.0)

## 2015-11-17 NOTE — Progress Notes (Signed)
Subjective:    Patient ID: [name] Sherman, female    DOB: 01/04/1961, 55 y.o.   MRN: [id number]  HPI Follow-up hypertension. Started losartan HCTZ last visit. She is tolerating with no side effects. Blood pressures at home ranging usually [id number] systolic and [id number] diastolic. No medication side effects. Still no regular exercise.  Hyperlipidemia. Taking Lipitor more regularly. No myalgias. We plan on follow-up labs today.  Past Medical History  Diagnosis Date  . Migraine   . Vitamin D deficiency   . Hyperlipidemia   . Umbilical hernia without obstruction or gangrene   . Urgency of urination   . Left ureteral stone   . Nephrolithiasis     bilateral nonobstructive per ct  . History of cervical cancer     s/p  laser ablation of cervix 1980's  . ADD (attention deficit disorder)   . OSA (obstructive sleep apnea)     pt used cpap up until 2013 states lost wt and did not need anymore   Past Surgical History  Procedure Laterality Date  . Bunionectomy Right 2004  . Cystoscopy with retrograde pyelogram, ureteroscopy and stent placement Left 05/01/2015    Procedure: CYSTOSCOPY WITH RETROGRADE PYELOGRAM AND STENT PLACEMENT;  Surgeon: Festus Aloe, MD;  Location: [facility];  Service: Urology;  Laterality: Left;  . Laser ablation of the cervix  x2  1980's  . Tubal ligation  1980's  . Laparoscopic assisted vaginal hysterectomy  04-23-2002    and Left Salpingoophorectomy/  Anterior Repair/  Tension Free Tape Sling placement  . Cystoscopy with stent placement Bilateral 05/26/2015    Procedure: CYSTOSCOPY WITH STENT PLACEMENT;  Surgeon: Festus Aloe, MD;  Location: [facility];  Service: Urology;  Laterality: Bilateral;  . Cystoscopy w/ ureteral stent removal Left 05/26/2015    Procedure: CYSTOSCOPY WITH STENT REMOVAL;  Surgeon: Festus Aloe, MD;  Location: [facility];  Service: Urology;  Laterality: Left;  . Cystoscopy w/ retrogrades Bilateral 05/26/2015     Procedure: CYSTOSCOPY WITH RETROGRADE PYELOGRAM;  Surgeon: Festus Aloe, MD;  Location: [facility];  Service: Urology;  Laterality: Bilateral;  . Cystoscopy with ureteroscopy Bilateral 05/26/2015    Procedure: CYSTOSCOPY WITH URETEROSCOPY;  Surgeon: Festus Aloe, MD;  Location: [facility];  Service: Urology;  Laterality: Bilateral;  . Holmium laser application Left [id number]    Procedure: HOLMIUM LASER APPLICATION;  Surgeon: Festus Aloe, MD;  Location: [facility];  Service: Urology;  Laterality: Left;  . Abdominal hysterectomy    . Cystoscopy/ureteroscopy/holmium laser/stent placement Right 06/09/2015    Procedure: CYSTOSCOPY/URETEROSCOPY/STENT PLACEMENT REMOVAL LEFT URETERAL STENT;  Surgeon: Festus Aloe, MD;  Location: [facility];  Service: Urology;  Laterality: Right;  . Holmium laser application Right 0000000    Procedure: HOLMIUM LASER APPLICATION;  Surgeon: Festus Aloe, MD;  Location: [facility];  Service: Urology;  Laterality: Right;    reports that she has never smoked. She has never used smokeless tobacco. She reports that she does not drink alcohol or use illicit drugs. family history includes Alcohol abuse in her mother; Arthritis in her father; Cancer in her maternal grandmother and paternal grandmother; Heart disease in her father; Hyperlipidemia in her father; Hypertension in her father; Kidney disease in her paternal uncle. No Known Allergies    Review of Systems  Constitutional: Negative for fatigue.  Eyes: Negative for visual disturbance.  Respiratory: Negative for cough, chest tightness, shortness of breath and wheezing.   Cardiovascular: Negative for chest pain, palpitations and  leg swelling.  Neurological: Negative for dizziness, seizures, syncope, weakness, light-headedness and headaches.       Objective:   Physical Exam  Constitutional: She appears well-developed and well-nourished.  Eyes: Pupils  are equal, round, and reactive to light.  Neck: Neck supple. No JVD present. No thyromegaly present.  Cardiovascular: Normal rate and regular rhythm.  Exam reveals no gallop.   Pulmonary/Chest: Effort normal and breath sounds normal. No respiratory distress. She has no wheezes. She has no rales.  Musculoskeletal: She exhibits no edema.  Neurological: She is alert.          Assessment & Plan:  #1 hypertension. Slightly improved. Still has borderline elevated diastolic. We've recommended weight loss and more consistent aerobic exercise and reassess in 6 months. Check basic metabolic panel today  #2 hyperlipidemia. Continue Lipitor. Recheck lipid panel  [name] MD [facility] at [facility]

## 2015-11-17 NOTE — Progress Notes (Signed)
Pre visit review using our clinic review tool, if applicable. No additional management support is needed unless otherwise documented below in the visit note. 

## 2015-11-25 ENCOUNTER — Telehealth: Payer: Self-pay | Admitting: Family Medicine

## 2015-11-25 NOTE — Telephone Encounter (Signed)
Pt request refill  aamphetamine-dextroamphetamine (ADDERALL) 20 MG tablet 3 mo supply  Pt is out. Thought she had a rx at the pharmacy.

## 2015-11-26 NOTE — Telephone Encounter (Signed)
Last refill was 08/19/2015 X3 Last OV 11-17-2015  Please advise on refill

## 2015-11-27 MED ORDER — AMPHETAMINE-DEXTROAMPHETAMINE 20 MG PO TABS: 20.0000 mg | ORAL_TABLET | Freq: Two times a day (BID) | ORAL | Status: DC

## 2015-11-27 NOTE — Telephone Encounter (Signed)
Pt is aware that RXs are up front for pick up.

## 2015-11-27 NOTE — Telephone Encounter (Signed)
OK 

## 2015-11-27 NOTE — Telephone Encounter (Signed)
Printed for signature

## 2015-12-18 ENCOUNTER — Other Ambulatory Visit: Payer: Self-pay | Admitting: Family Medicine

## 2016-01-05 ENCOUNTER — Telehealth: Payer: Self-pay | Admitting: Family Medicine

## 2016-01-05 NOTE — Telephone Encounter (Signed)
Pt has pending appt tomorrow.

## 2016-01-05 NOTE — Telephone Encounter (Signed)
Pt would like to know if she should go to work she has nasty stuff coming out of both eyes and didn't know if she is contagious. Pt would like a call back.  Refused [employer].

## 2016-01-06 ENCOUNTER — Ambulatory Visit (INDEPENDENT_AMBULATORY_CARE_PROVIDER_SITE_OTHER): Payer: Managed Care, Other (non HMO) | Admitting: Family Medicine

## 2016-01-06 VITALS — BP 110/80 | HR 67 | Temp 97.7°F | Ht 65.0 in | Wt 219.9 lb

## 2016-01-06 DIAGNOSIS — H10023 Other mucopurulent conjunctivitis, bilateral: Secondary | ICD-10-CM

## 2016-01-06 DIAGNOSIS — M545 Low back pain, unspecified: Secondary | ICD-10-CM

## 2016-01-06 DIAGNOSIS — R300 Dysuria: Secondary | ICD-10-CM | POA: Diagnosis not present

## 2016-01-06 LAB — POCT URINALYSIS DIPSTICK
Bilirubin, UA: NEGATIVE
Blood, UA: NEGATIVE
Glucose, UA: NEGATIVE
Ketones, UA: NEGATIVE
Nitrite, UA: NEGATIVE
PH UA: 6
PROTEIN UA: NEGATIVE
Spec Grav, UA: 1.025
Urobilinogen, UA: 0.2

## 2016-01-06 MED ORDER — POLYMYXIN B-TRIMETHOPRIM 10000-0.1 UNIT/ML-% OP SOLN: 2.0000 [drp] | OPHTHALMIC | Status: DC

## 2016-01-06 MED ORDER — NITROFURANTOIN MONOHYD MACRO 100 MG PO CAPS: 100.0000 mg | ORAL_CAPSULE | Freq: Two times a day (BID) | ORAL | Status: DC

## 2016-01-06 NOTE — Progress Notes (Signed)
Subjective:    Patient ID: [name] Sherman, female    DOB: [birth year], 55 y.o.   MRN: [id number]  HPI  Patient seen with 2 new acute problems   First , she has bilateral eye irritation for 3 days. She had noted redness of the conjunctiva and thick purulent drainage early in the mornings. No contact use. No blurred vision. No eye pain. Some itching.  Second problem is she's had some burning with urination especially at the and of urination along with some suprapubic pressure and urgency for the past few days. She is also noticing increased odor of her urine. No gross hematuria. She had complications from kidney stone within the past year. She has not had any unilateral flank pain or pelvic pain. No nausea or vomiting. No known drug allergies  Past Medical History  Diagnosis Date  . Migraine   . Vitamin D deficiency   . Hyperlipidemia   . Umbilical hernia without obstruction or gangrene   . Urgency of urination   . Left ureteral stone   . Nephrolithiasis     bilateral nonobstructive per ct  . History of cervical cancer     s/p  laser ablation of cervix 1980's  . ADD (attention deficit disorder)   . OSA (obstructive sleep apnea)     pt used cpap up until 2013 states lost wt and did not need anymore   Past Surgical History  Procedure Laterality Date  . Bunionectomy Right 2004  . Cystoscopy with retrograde pyelogram, ureteroscopy and stent placement Left 05/01/2015    Procedure: CYSTOSCOPY WITH RETROGRADE PYELOGRAM AND STENT PLACEMENT;  Surgeon: Festus Aloe, MD;  Location: [facility];  Service: Urology;  Laterality: Left;  . Laser ablation of the cervix  x2  1980's  . Tubal ligation  1980's  . Laparoscopic assisted vaginal hysterectomy  04-23-2002    and Left Salpingoophorectomy/  Anterior Repair/  Tension Free Tape Sling placement  . Cystoscopy with stent placement Bilateral 05/26/2015    Procedure: CYSTOSCOPY WITH STENT PLACEMENT;  Surgeon: Festus Aloe, MD;  Location: [facility];  Service: Urology;  Laterality: Bilateral;  . Cystoscopy w/ ureteral stent removal Left 05/26/2015    Procedure: CYSTOSCOPY WITH STENT REMOVAL;  Surgeon: Festus Aloe, MD;  Location: [facility];  Service: Urology;  Laterality: Left;  . Cystoscopy w/ retrogrades Bilateral 05/26/2015    Procedure: CYSTOSCOPY WITH RETROGRADE PYELOGRAM;  Surgeon: Festus Aloe, MD;  Location: [facility];  Service: Urology;  Laterality: Bilateral;  . Cystoscopy with ureteroscopy Bilateral 05/26/2015    Procedure: CYSTOSCOPY WITH URETEROSCOPY;  Surgeon: Festus Aloe, MD;  Location: [facility];  Service: Urology;  Laterality: Bilateral;  . Holmium laser application Left [id number]    Procedure: HOLMIUM LASER APPLICATION;  Surgeon: Festus Aloe, MD;  Location: [facility];  Service: Urology;  Laterality: Left;  . Abdominal hysterectomy    . Cystoscopy/ureteroscopy/holmium laser/stent placement Right 06/09/2015    Procedure: CYSTOSCOPY/URETEROSCOPY/STENT PLACEMENT REMOVAL LEFT URETERAL STENT;  Surgeon: Festus Aloe, MD;  [facility];  Service: Urology;  Laterality: Right;  . Holmium laser application Right 0000000    Procedure: HOLMIUM LASER APPLICATION;  Surgeon: Festus Aloe, MD;  Location: [facility];  Service: Urology;  Laterality: Right;    reports that she has never smoked. She has never used smokeless tobacco. She reports that she does not drink alcohol or use illicit drugs. family history includes Alcohol abuse in her mother; Arthritis in her father;  Cancer in her maternal grandmother and paternal grandmother; Heart disease in her father; Hyperlipidemia in her father; Hypertension in her father; Kidney disease in her paternal uncle. No Known Allergies    Review of Systems  Constitutional: Negative for fever, chills and appetite change.  Eyes: Positive for discharge, redness and itching. Negative for  photophobia, pain and visual disturbance.  Gastrointestinal: Negative for nausea, vomiting, abdominal pain, diarrhea and constipation.  Genitourinary: Positive for dysuria and frequency. Negative for hematuria and pelvic pain.  Musculoskeletal: Negative for back pain.  Neurological: Negative for dizziness.       Objective:   Physical Exam  Constitutional: She appears well-developed and well-nourished.  Eyes: Pupils are equal, round, and reactive to light.  Both conjunctivae are erythematous. Cornea appears normal. Pupils equal round reactive to light.  Cardiovascular: Normal rate and regular rhythm.  Exam reveals no gallop.   No murmur heard. Pulmonary/Chest: Effort normal and breath sounds normal. No respiratory distress. She has no wheezes. She has no rales.          Assessment & Plan:  #1 probable bilateral bacterial conjunctivitis. Polytrim ophthalmic drops every 2-4 hours while awake. Continue warm compresses. Touch base in 2-3 days if not resolving  #2 dysuria. Rule out UTI.Urine dipstick reveals small leukocytes otherwise negative. Urine culture sent. Increase hydration. Start Macrobid 1 twice a day for 5 days.  [name] MD [facility] at [facility]

## 2016-01-06 NOTE — Patient Instructions (Signed)
Bacterial Conjunctivitis Bacterial conjunctivitis, commonly called pink eye, is an inflammation of the clear membrane that covers the white part of the eye (conjunctiva). The inflammation can also happen on the underside of the eyelids. The blood vessels in the conjunctiva become inflamed, causing the eye to become red or pink. Bacterial conjunctivitis may spread easily from one eye to another and from person to person (contagious).  CAUSES  Bacterial conjunctivitis is caused by bacteria. The bacteria may come from your own skin, your upper respiratory tract, or from someone else with bacterial conjunctivitis. SYMPTOMS  The normally white color of the eye or the underside of the eyelid is usually pink or red. The pink eye is usually associated with irritation, tearing, and some sensitivity to light. Bacterial conjunctivitis is often associated with a thick, yellowish discharge from the eye. The discharge may turn into a crust on the eyelids overnight, which causes your eyelids to stick together. If a discharge is present, there may also be some blurred vision in the affected eye. DIAGNOSIS  Bacterial conjunctivitis is diagnosed by your caregiver through an eye exam and the symptoms that you report. Your caregiver looks for changes in the surface tissues of your eyes, which may point to the specific type of conjunctivitis. A sample of any discharge may be collected on a cotton-tip swab if you have a severe case of conjunctivitis, if your cornea is affected, or if you keep getting repeat infections that do not respond to treatment. The sample will be sent to a lab to see if the inflammation is caused by a bacterial infection and to see if the infection will respond to antibiotic medicines. TREATMENT   Bacterial conjunctivitis is treated with antibiotics. Antibiotic eyedrops are most often used. However, antibiotic ointments are also available. Antibiotics pills are sometimes used. Artificial tears or eye  washes may ease discomfort. HOME CARE INSTRUCTIONS   To ease discomfort, apply a cool, clean washcloth to your eye for 10-20 minutes, 3-4 times a day.  Gently wipe away any drainage from your eye with a warm, wet washcloth or a cotton ball.  Wash your hands often with soap and water. Use paper towels to dry your hands.  Do not share towels or washcloths. This may spread the infection.  Change or wash your pillowcase every day.  You should not use eye makeup until the infection is gone.  Do not operate machinery or drive if your vision is blurred.  Stop using contact lenses. Ask your caregiver how to sterilize or replace your contacts before using them again. This depends on the type of contact lenses that you use.  When applying medicine to the infected eye, do not touch the edge of your eyelid with the eyedrop bottle or ointment tube. SEEK IMMEDIATE MEDICAL CARE IF:   Your infection has not improved within 3 days after beginning treatment.  You had yellow discharge from your eye and it returns.  You have increased eye pain.  Your eye redness is spreading.  Your vision becomes blurred.  You have a fever or persistent symptoms for more than 2-3 days.  You have a fever and your symptoms suddenly get worse.  You have facial pain, redness, or swelling. MAKE SURE YOU:   Understand these instructions.  Will watch your condition.  Will get help right away if you are not doing well or get worse.   This information is not intended to replace advice given to you by your health care provider. Make sure you   discuss any questions you have with your health care provider.   Document Released: 06/13/2005 Document Revised: 07/04/2014 Document Reviewed: 11/14/2011 Elsevier Interactive Patient Education 2016 Elsevier Inc. Urinary Tract Infection Urinary tract infections (UTIs) can develop anywhere along your urinary tract. Your urinary tract is your body's drainage system for removing  wastes and extra water. Your urinary tract includes two kidneys, two ureters, a bladder, and a urethra. Your kidneys are a pair of bean-shaped organs. Each kidney is about the size of your fist. They are located below your ribs, one on each side of your spine. CAUSES Infections are caused by microbes, which are microscopic organisms, including fungi, viruses, and bacteria. These organisms are so small that they can only be seen through a microscope. Bacteria are the microbes that most commonly cause UTIs. SYMPTOMS  Symptoms of UTIs may vary by age and gender of the patient and by the location of the infection. Symptoms in young women typically include a frequent and intense urge to urinate and a painful, burning feeling in the bladder or urethra during urination. Older women and men are more likely to be tired, shaky, and weak and have muscle aches and abdominal pain. A fever may mean the infection is in your kidneys. Other symptoms of a kidney infection include pain in your back or sides below the ribs, nausea, and vomiting. DIAGNOSIS To diagnose a UTI, your caregiver will ask you about your symptoms. Your caregiver will also ask you to provide a urine sample. The urine sample will be tested for bacteria and white blood cells. White blood cells are made by your body to help fight infection. TREATMENT  Typically, UTIs can be treated with medication. Because most UTIs are caused by a bacterial infection, they usually can be treated with the use of antibiotics. The choice of antibiotic and length of treatment depend on your symptoms and the type of bacteria causing your infection. HOME CARE INSTRUCTIONS  If you were prescribed antibiotics, take them exactly as your caregiver instructs you. Finish the medication even if you feel better after you have only taken some of the medication.  Drink enough water and fluids to keep your urine clear or pale yellow.  Avoid caffeine, tea, and carbonated beverages.  They tend to irritate your bladder.  Empty your bladder often. Avoid holding urine for long periods of time.  Empty your bladder before and after sexual intercourse.  After a bowel movement, women should cleanse from front to back. Use each tissue only once. SEEK MEDICAL CARE IF:   You have back pain.  You develop a fever.  Your symptoms do not begin to resolve within 3 days. SEEK IMMEDIATE MEDICAL CARE IF:   You have severe back pain or lower abdominal pain.  You develop chills.  You have nausea or vomiting.  You have continued burning or discomfort with urination. MAKE SURE YOU:   Understand these instructions.  Will watch your condition.  Will get help right away if you are not doing well or get worse.   This information is not intended to replace advice given to you by your health care provider. Make sure you discuss any questions you have with your health care provider.   Document Released: 03/23/2005 Document Revised: 03/04/2015 Document Reviewed: 07/22/2011 Elsevier Interactive Patient Education [employer].

## 2016-01-06 NOTE — Progress Notes (Signed)
Pre visit review using our clinic review tool, if applicable. No additional management support is needed unless otherwise documented below in the visit note. 

## 2016-01-09 LAB — URINE CULTURE

## 2016-01-12 MED ORDER — TOBRAMYCIN 0.3 % OP SOLN: OPHTHALMIC | Status: DC

## 2016-01-12 NOTE — Addendum Note (Signed)
Addended by: Elio Forget on: 01/12/2016 08:50 AM   Modules accepted: Orders, Medications

## 2016-01-24 ENCOUNTER — Other Ambulatory Visit: Payer: Self-pay | Admitting: Family Medicine

## 2016-02-16 ENCOUNTER — Ambulatory Visit: Payer: Managed Care, Other (non HMO) | Admitting: Family Medicine

## 2016-02-17 ENCOUNTER — Ambulatory Visit (INDEPENDENT_AMBULATORY_CARE_PROVIDER_SITE_OTHER): Payer: Managed Care, Other (non HMO) | Admitting: Family Medicine

## 2016-02-17 ENCOUNTER — Ambulatory Visit: Payer: Managed Care, Other (non HMO) | Admitting: Family Medicine

## 2016-02-17 DIAGNOSIS — M722 Plantar fascial fibromatosis: Secondary | ICD-10-CM | POA: Diagnosis not present

## 2016-02-17 MED ORDER — METHYLPREDNISOLONE ACETATE 40 MG/ML IJ SUSP
40.0000 mg | Freq: Once | INTRAMUSCULAR | Status: AC
  Administered 2016-02-17: 40 mg via INTRA_ARTICULAR

## 2016-02-17 MED ORDER — AMPHETAMINE-DEXTROAMPHETAMINE 20 MG PO TABS: 20.0000 mg | ORAL_TABLET | Freq: Two times a day (BID) | ORAL | 0 refills | Status: DC

## 2016-02-17 MED ORDER — VITAMIN D (ERGOCALCIFEROL) 1.25 MG (50000 UNIT) PO CAPS: 50000.0000 [IU] | ORAL_CAPSULE | ORAL | 3 refills | Status: DC

## 2016-02-17 MED ORDER — METHYLPREDNISOLONE ACETATE 80 MG/ML IJ SUSP
80.0000 mg | Freq: Once | INTRAMUSCULAR | Status: AC
  Administered 2016-02-17: 40 mg via INTRA_ARTICULAR

## 2016-02-17 MED ORDER — METHYLPREDNISOLONE ACETATE 80 MG/ML IJ SUSP: 80.0000 mg | Freq: Once | INTRAMUSCULAR | Status: DC

## 2016-02-17 NOTE — Progress Notes (Signed)
Subjective:     Patient ID: [name] Sherman, female   DOB: 02-16-1961, 55 y.o.   MRN: [id number]  HPI Patient seen with left foot pain. Location is plantar fascia medially to midline. She recent started exercise program and started couple months ago with pain in the heel region. This is worse first thing in morning when she first puts her foot down and also after prolonged period of sitting and first getting up. Denies any injury. She's tried heel inserts and some icing and stretching without much improvement. Denies any prior history of plantar fasciitis. She denies any Achilles pain.  No injury.  Past Medical History:  Diagnosis Date  . ADD (attention deficit disorder)   . History of cervical cancer    s/p  laser ablation of cervix 1980's  . Hyperlipidemia   . Left ureteral stone   . Migraine   . Nephrolithiasis    bilateral nonobstructive per ct  . OSA (obstructive sleep apnea)    pt used cpap up until 2013 states lost wt and did not need anymore  . Umbilical hernia without obstruction or gangrene   . Urgency of urination   . Vitamin D deficiency    Past Surgical History:  Procedure Laterality Date  . ABDOMINAL HYSTERECTOMY    . BUNIONECTOMY Right 2004  . CYSTOSCOPY W/ RETROGRADES Bilateral 05/26/2015   Procedure: CYSTOSCOPY WITH RETROGRADE PYELOGRAM;  Surgeon: Festus Aloe, MD;  Location: [facility];  Service: Urology;  Laterality: Bilateral;  . CYSTOSCOPY W/ URETERAL STENT REMOVAL Left 05/26/2015   Procedure: CYSTOSCOPY WITH STENT REMOVAL;  Surgeon: Festus Aloe, MD;  [facility];  Service: Urology;  Laterality: Left;  . CYSTOSCOPY WITH RETROGRADE PYELOGRAM, URETEROSCOPY AND STENT PLACEMENT Left 05/01/2015   Procedure: CYSTOSCOPY WITH RETROGRADE PYELOGRAM AND STENT PLACEMENT;  Surgeon: Festus Aloe, MD;  Location[facility];  Service: Urology;  Laterality: Left;  . CYSTOSCOPY WITH STENT PLACEMENT Bilateral 05/26/2015   Procedure:  CYSTOSCOPY WITH STENT PLACEMENT;  Surgeon: Festus Aloe, MD;  Location: [facility];  Service: Urology;  Laterality: Bilateral;  . CYSTOSCOPY WITH URETEROSCOPY Bilateral 05/26/2015   Procedure: CYSTOSCOPY WITH URETEROSCOPY;  Surgeon: Festus Aloe, MD;  Location: [facility];  Service: Urology;  Laterality: Bilateral;  . CYSTOSCOPY/URETEROSCOPY/HOLMIUM LASER/STENT PLACEMENT Right 06/09/2015   Procedure: CYSTOSCOPY/URETEROSCOPY/STENT PLACEMENT REMOVAL LEFT URETERAL STENT;  Surgeon: Festus Aloe, MD;  Location[facility];  Service: Urology;  Laterality: Right;  . HOLMIUM LASER APPLICATION Left [id number]   Procedure: HOLMIUM LASER APPLICATION;  Surgeon: Festus Aloe, MD;  Location: [facility];  Service: Urology;  Laterality: Left;  . HOLMIUM LASER APPLICATION Right 0000000   Procedure: HOLMIUM LASER APPLICATION;  Surgeon: Festus Aloe, MD;  Location: [facility];  Service: Urology;  Laterality: Right;  . LAPAROSCOPIC ASSISTED VAGINAL HYSTERECTOMY  04-23-2002   and Left Salpingoophorectomy/  Anterior Repair/  Tension Free Tape Sling placement  . LASER ABLATION OF THE CERVIX  x2  1980's  . TUBAL LIGATION  1980's    reports that she has never smoked. She has never used smokeless tobacco. She reports that she does not drink alcohol or use drugs. family history includes Alcohol abuse in her mother; Arthritis in her father; Cancer in her maternal grandmother and paternal grandmother; Heart disease in her father; Hyperlipidemia in her father; Hypertension in her father; Kidney disease in her paternal uncle. No Known Allergies   Review of Systems  Neurological: Negative for weakness and numbness.  Objective:   Physical Exam  Constitutional: She appears well-developed and well-nourished.  Cardiovascular: Normal rate and regular rhythm.   Pulmonary/Chest: Effort normal and breath sounds normal. No respiratory distress. She has no  wheezes. She has no rales.  Musculoskeletal:  Left foot reveals no visible swelling. No ecchymosis. No erythema. No warmth. She has tenderness over the proximal plantar fascia medial to midline No Achilles tenderness. No retrocalcaneal tenderness       Assessment:     Left plantar fasciitis    Plan:     -Continue regular stretches and icing -Continue with heel cups -Strengthening exercises were given -Patient requesting steroid injection. We discussed risk and benefits including risk of bleeding, bruising, fat pad atrophy and patient consented. Skin prepped with Betadine. Using 1/2 inch 25-gauge needle injected 40 mg Depo-Medrol and 1 mL of plain Xylocaine. Patient tolerated well.  Touch base in 2 weeks if not further improved  [name] MD [facility] at [facility]

## 2016-02-17 NOTE — Progress Notes (Signed)
Pre visit review using our clinic review tool, if applicable. No additional management support is needed unless otherwise documented below in the visit note. 

## 2016-02-17 NOTE — Patient Instructions (Signed)

## 2016-02-26 IMAGING — CT CT ABD-PELV W/ CM
2 of 5 series · 15 of 46 positions shown, 17 images · IV contrast (omnipaque)
Comparison: CT dated 05/21/2015

CLINICAL DATA: Ureteral stents with mild bilateral hydronephrosis.
Small nonobstructing bilateral renal calculi noted. Correlation with
urinalysis recommended to exclude UTI.

EXAM:
CT ABDOMEN AND PELVIS WITH CONTRAST
TECHNIQUE: Multidetector CT imaging of the abdomen and pelvis was performed
using the standard protocol following bolus administration of
intravenous contrast.
CONTRAST:  100mL OMNIPAQUE IOHEXOL 300 MG/ML  SOLN

[Series 2: abd/pel with · axial · 0.78mm/px · z∈[-81,+309]mm · 12 of 88 slices shown, 14 images]
[im 5/88  soft-tissue]
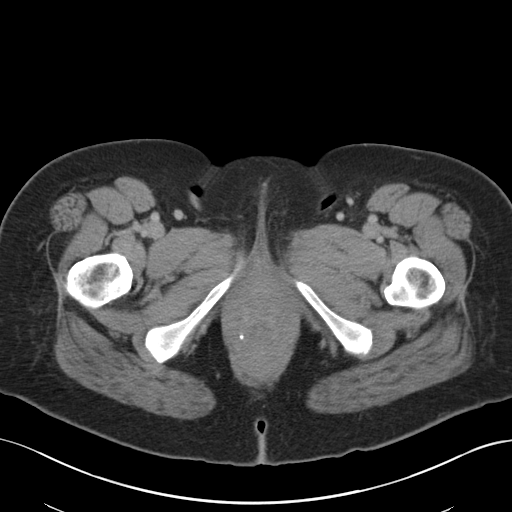
[im 5/88  bone]
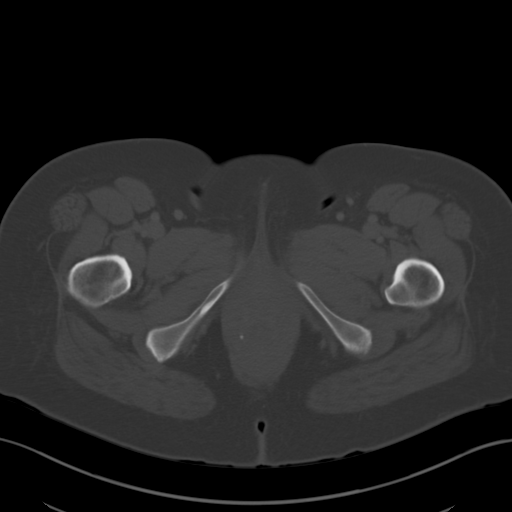
[im 13/88  soft-tissue]
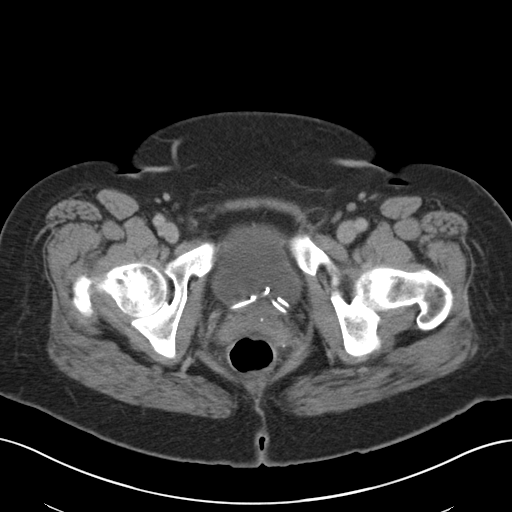
[im 21/88  soft-tissue]
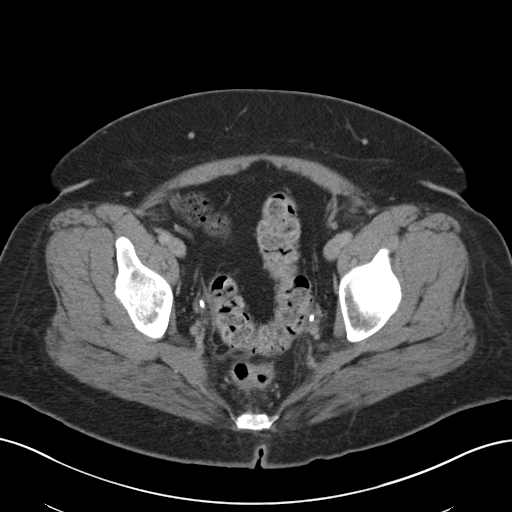
[im 25/88  soft-tissue]
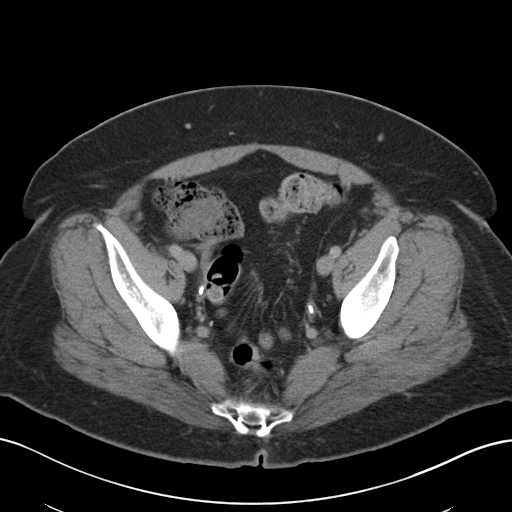
[im 34/88  soft-tissue]
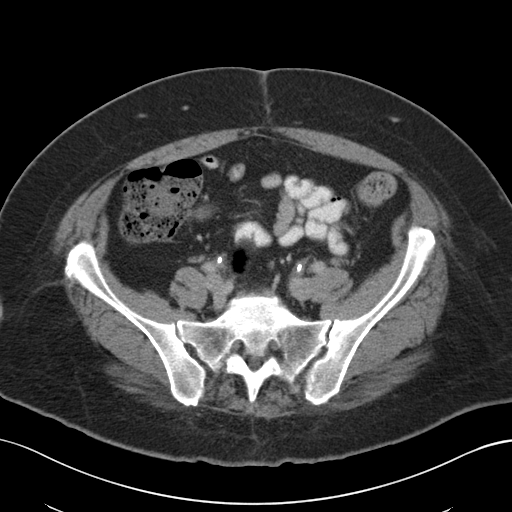
[im 42/88  soft-tissue]
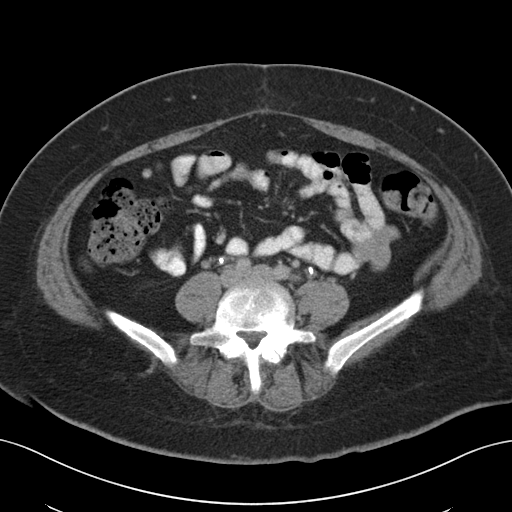
[im 46/88  soft-tissue]
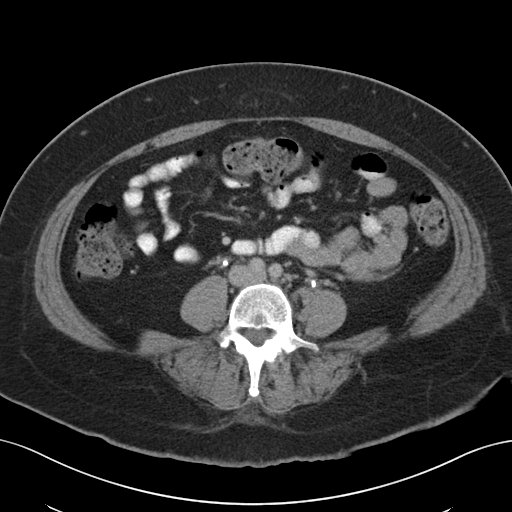
[im 54/88  soft-tissue]
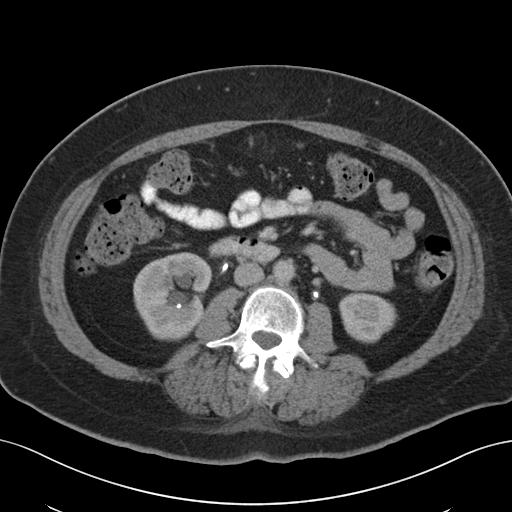
[im 63/88  soft-tissue]
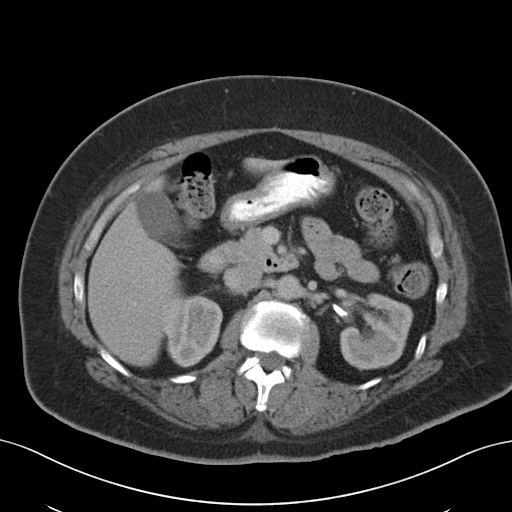
[im 63/88  bone]
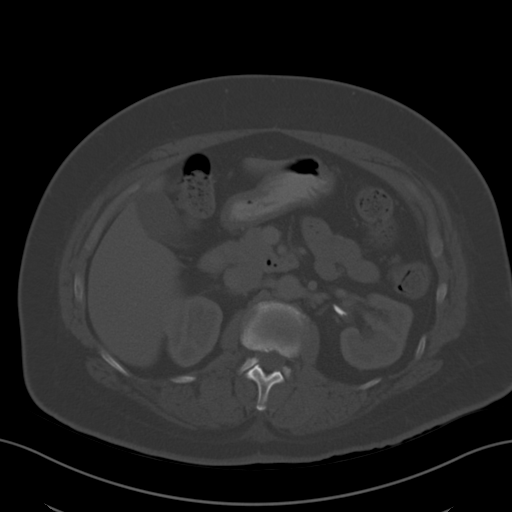
[im 67/88  soft-tissue]
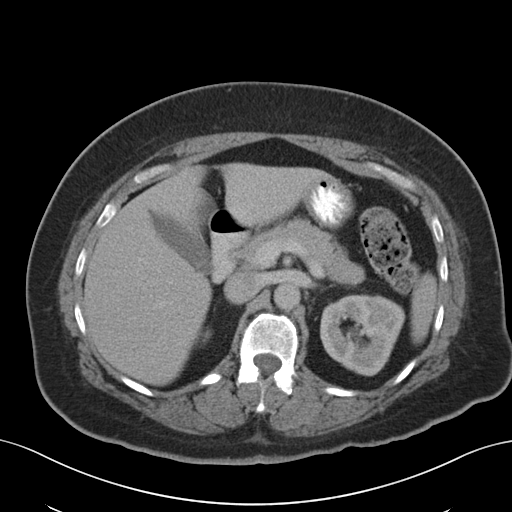
[im 75/88  soft-tissue]
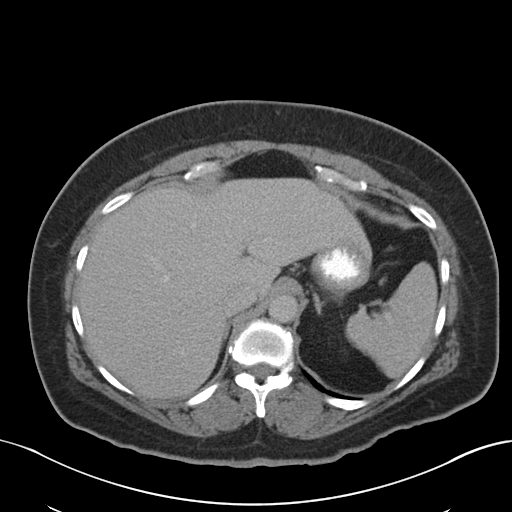
[im 83/88  soft-tissue]
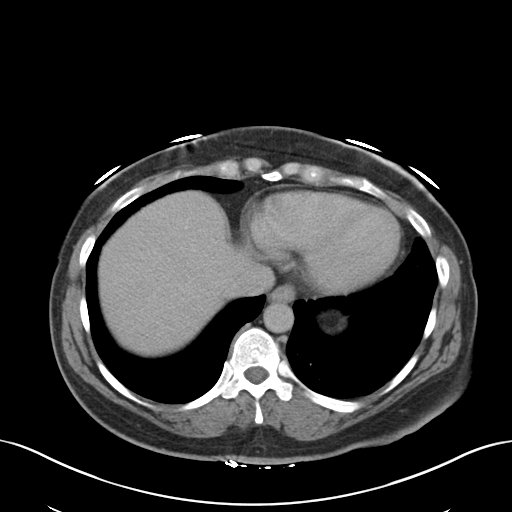

[Series 4: coronal a/|p · coronal · 0.74mm/px · 3 of 96 slices shown]
[im 32/96  soft-tissue]
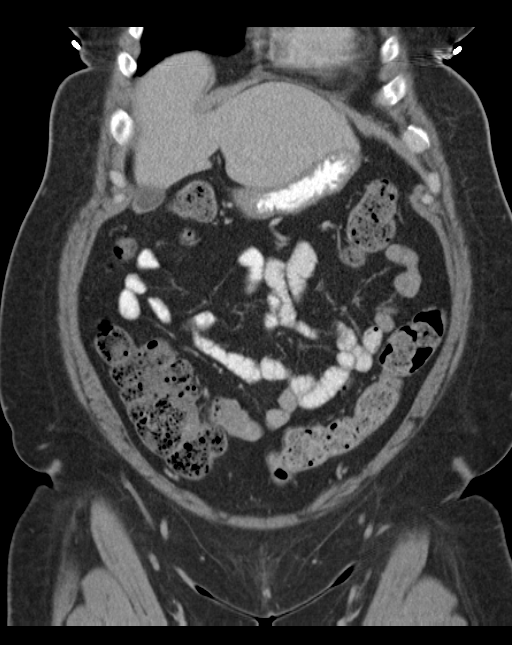
[im 43/96  soft-tissue]
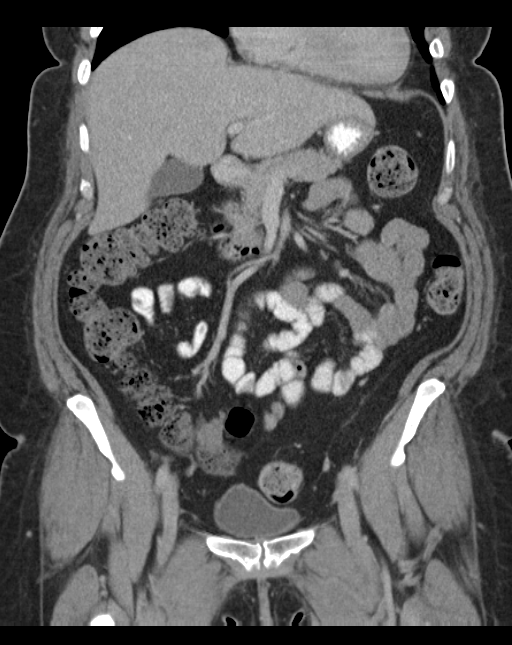
[im 53/96  soft-tissue]
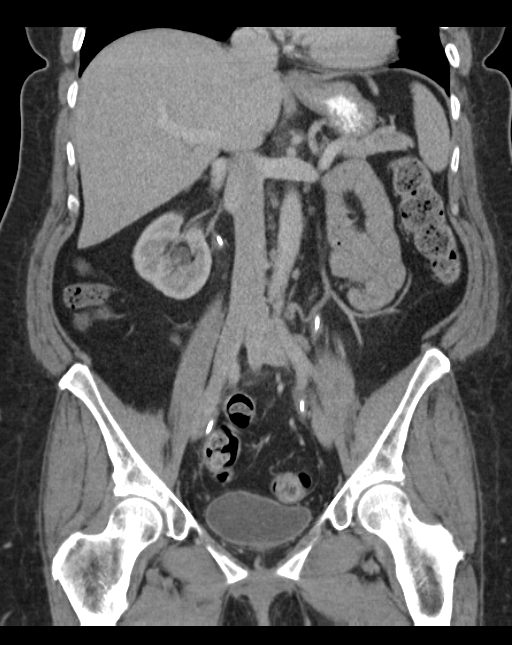

[15 of 46 positions shown; findings below may reference images not displayed]

FINDINGS: The visualized lung bases are clear. No intra-abdominal free air or
free fluid.

The liver, pancreas, spleen, and adrenal glands appear unremarkable.
Minimal focal thickening at the gallbladder fundus likely represents
a focal adenomyomatosis. No calcified gallstone or pericholecystic
fluid.

Bilateral pigtail ureteral stents noted with proximal tip in the
renal pelvises and distal end within the urinary bladder. There are
stable appearing mild bilateral hydronephrosis. There is a 3 mm
nonobstructing calculus in the inferior pole of the right kidney. A
3 mm nonobstructing inferior pole and a punctate interpolar calculi
noted in the left kidney. The previously seen 8 mm calculus in the
interpolar aspect of the left kidney is not visualized on the
current study. The year bladder is grossly unremarkable. The
prostate gland is mildly enlarged.

Copious amount of dense stool noted throughout the colon. No
evidence of bowel obstruction or inflammation. Normal appendix.
There is a 1.9 x 2.7 cm nodular soft tissue lesion adjacent and
posterior to the cecum (series 2, image 61 and coronal series 4,
image 45) which appears grossly stable since the CT study dated
12/10/2012. This may represent an enlarged lymph nodes and may be
benign given stability since 3410. A neoplastic process is not
excluded. MRI is recommended for further evaluation.

The abdominal aorta and IVC appear patent. No portal venous gas
identified. There is no lymphadenopathy. There is a small fat
containing umbilical hernia. A small supraumbilical supraumbilical
and a smaller infraumbilical fat containing hernias noted. There is
no inflammatory changes of the herniated fat to suggest
strangulation. There is degenerative changes of the spine. No acute
fracture.
IMPRESSION: Constipation. No evidence of bowel obstruction or inflammation.
Normal appendix.

Bilateral ureteral stents with mild bilateral hydronephrosis. Small
nonobstructing bilateral renal calculi. Correlation with urinalysis
recommended to exclude UTI.

Grossly stable appearing retrocecal nodular mass of indeterminate
etiology. MRI is recommended for further characterization.

## 2016-04-19 ENCOUNTER — Ambulatory Visit: Payer: Managed Care, Other (non HMO) | Admitting: Family Medicine

## 2016-04-20 ENCOUNTER — Encounter: Payer: Self-pay | Admitting: Family Medicine

## 2016-04-20 ENCOUNTER — Ambulatory Visit: Payer: Managed Care, Other (non HMO) | Admitting: Family Medicine

## 2016-04-20 ENCOUNTER — Ambulatory Visit (INDEPENDENT_AMBULATORY_CARE_PROVIDER_SITE_OTHER): Payer: Managed Care, Other (non HMO) | Admitting: Family Medicine

## 2016-04-20 VITALS — BP 120/80 | HR 88 | Resp 12 | Ht 65.0 in | Wt 232.2 lb

## 2016-04-20 DIAGNOSIS — R399 Unspecified symptoms and signs involving the genitourinary system: Secondary | ICD-10-CM

## 2016-04-20 DIAGNOSIS — N3000 Acute cystitis without hematuria: Secondary | ICD-10-CM | POA: Diagnosis not present

## 2016-04-20 LAB — POCT URINALYSIS DIPSTICK
Bilirubin, UA: NEGATIVE
Glucose, UA: NEGATIVE
Ketones, UA: NEGATIVE
Leukocytes, UA: NEGATIVE
NITRITE UA: NEGATIVE
PH UA: 6
Protein, UA: POSITIVE
RBC UA: NEGATIVE
UROBILINOGEN UA: NEGATIVE

## 2016-04-20 MED ORDER — NITROFURANTOIN MONOHYD MACRO 100 MG PO CAPS: 100.0000 mg | ORAL_CAPSULE | Freq: Two times a day (BID) | ORAL | 0 refills | Status: AC

## 2016-04-20 NOTE — Progress Notes (Signed)
HPI:   [name] Sherman is a 55 y.o. female, who is here today complaining of 3 days of urinary symptoms.  Dysuria: Yes, "burning" sensation with urination. Urinary frequency: Yes Urinary urgency: Yes Incontinence: Yes, urgency incontinence. Hematuria: Denies.  Hx of nephrolithiasis. States that usually presents with associated back pain, which she has not had so far. She is not drinking enough fluids and holding urine for hours even if she feels like she needs to void.  Abdominal pain: Denies + "Little" nausea, no vomiting.  Abnormal vaginal bleeding or discharge: Denies She has not noted fever,chills, fatigue, or body aches.   LMP: S/P hysterectomy. Sexual activity: Yes, has been more frequent in the past couple weeks.  Hx of UTI: Yes 12/2015 Ucx > 100,000 CFU E Coli.  OTC medications for this problem: Azo    Review of Systems  Constitutional: Negative for activity change, appetite change, chills, fatigue and fever.  Gastrointestinal: Negative for abdominal pain, nausea and vomiting.       No changes in bowel habits.  Genitourinary: Positive for dysuria, frequency and urgency. Negative for decreased urine volume, flank pain, hematuria, pelvic pain, vaginal bleeding and vaginal discharge.  Musculoskeletal: Negative for back pain and myalgias.      Current Outpatient Prescriptions on File Prior to Visit  Medication Sig Dispense Refill  . amphetamine-dextroamphetamine (ADDERALL) 20 MG tablet Take 1 tablet (20 mg total) by mouth 2 (two) times daily. May refill in one month. 60 tablet 0  . amphetamine-dextroamphetamine (ADDERALL) 20 MG tablet Take 1 tablet (20 mg total) by mouth 2 (two) times daily. 60 tablet 0  . amphetamine-dextroamphetamine (ADDERALL) 20 MG tablet Take 1 tablet (20 mg total) by mouth 2 (two) times daily. May refill in two months. 60 tablet 0  . atorvastatin (LIPITOR) 10 MG tablet Take 1 tablet (10 mg total) by mouth daily. 90 tablet 3  .  citalopram (CELEXA) 20 MG tablet TAKE 3 TABLETS BY MOUTH EVERY NIGHT AT BEDTIME OR AS DIRECTED 90 tablet 5  . ibuprofen (ADVIL,MOTRIN) 800 MG tablet Take 1 tablet (800 mg total) by mouth 3 (three) times daily. 21 tablet 0  . losartan-hydrochlorothiazide (HYZAAR) 50-12.5 MG tablet Take 1 tablet by mouth daily. 30 tablet 11  . OVER THE COUNTER MEDICATION Take 1 tablet by mouth daily. Cascara, herb for constipation    . SUMAtriptan (IMITREX) 100 MG tablet Take 100 mg by mouth every 2 (two) hours as needed for migraine. May repeat in 2 hours if headache persists or recurs.    Marland Kitchen tobramycin (TOBREX) 0.3 % ophthalmic solution Apply 2 drops in affected eye every 4 hours while awake. 5 mL 1  . Vitamin D, Ergocalciferol, (DRISDOL) 50000 units CAPS capsule Take 1 capsule (50,000 Units total) by mouth once a week. 12 capsule 3   No current facility-administered medications on file prior to visit.      Past Medical History:  Diagnosis Date  . ADD (attention deficit disorder)   . History of cervical cancer    s/p  laser ablation of cervix 1980's  . Hyperlipidemia   . Left ureteral stone   . Migraine   . Nephrolithiasis    bilateral nonobstructive per ct  . OSA (obstructive sleep apnea)    pt used cpap up until 2013 states lost wt and did not need anymore  . Umbilical hernia without obstruction or gangrene   . Urgency of urination   . Vitamin D deficiency    No  Known Allergies  Social History   Social History  . Marital status: Legally Separated    Spouse name: N/A  . Number of children: N/A  . Years of education: N/A   Social History Main Topics  . Smoking status: Never Smoker  . Smokeless tobacco: Never Used  . Alcohol use No     Comment: 1 x year  . Drug use: No  . Sexual activity: Not Asked   Other Topics Concern  . None   Social History Narrative  . None    Vitals:   04/20/16 1038  BP: 120/80  Pulse: 88   Body mass index is 38.65 kg/m.   Physical Exam  Nursing  note and vitals reviewed. Constitutional: She is oriented to person, place, and time. She appears well-developed. She does not appear ill. No distress.  HENT:  Head: Atraumatic.  Mouth/Throat: Oropharynx is clear and moist and mucous membranes are normal.  Eyes: Conjunctivae are normal.  Cardiovascular: Normal rate and regular rhythm.   No murmur heard. Respiratory: Effort normal and breath sounds normal. No respiratory distress.  GI: Soft. She exhibits no mass. There is no hepatomegaly. There is no tenderness. There is no CVA tenderness.  Musculoskeletal: She exhibits no edema or tenderness.  Neurological: She is alert and oriented to person, place, and time. She has normal strength. Gait normal.  Skin: Skin is warm. No erythema.  Psychiatric: She has a normal mood and affect. Her speech is normal.  Well groomed, good eye contact.      ASSESSMENT AND PLAN:     [place] was seen today for urinary tract infection.  Diagnoses and all orders for this visit:  UTI symptoms -     POCT urinalysis dipstick  Acute cystitis without hematuria -     Urine culture -     nitrofurantoin, macrocrystal-monohydrate, (MACROBID) 100 MG capsule; Take 1 capsule (100 mg total) by mouth 2 (two) times daily.   Urine dip today otherwise negative, protein + trace. Other possible causes of reported urinary symptoms discussed. Ucx ordered.   Empiric abx treatment started today based on Ucx of 12/2015, Macrobid. Treatment will be tailored according to Ucx results and susceptibility report. Increase fluid intake, avoid bladder irritants (tea,caffeine,spicies among some), avoid holding urine for more than 3 hours.  Clearly instructed about warning signs. F/U with PCP if symptoms persist.    -Ms.Dortha Wikel Garton was advised to return or notify a doctor immediately if symptoms worsen or persist or new concerns arise, she voices understanding.       Betty G. Martinique, MD  [facility]. [place] office.

## 2016-04-20 NOTE — Progress Notes (Signed)
Pre visit review using our clinic review tool, if applicable. No additional management support is needed unless otherwise documented below in the visit note. 

## 2016-04-20 NOTE — Patient Instructions (Signed)
  [name] I have seen you today for an acute visit.  1. UTI symptoms  - POCT urinalysis dipstick  2. Acute cystitis without hematuria  - Urine culture - nitrofurantoin, macrocrystal-monohydrate, (MACROBID) 100 MG capsule; Take 1 capsule (100 mg total) by mouth 2 (two) times daily.  Dispense: 10 capsule; Refill: 0    Adequate fluid intake, avoid holding urine for long hours, and over the counter Vit C OR cranberry capsules might help.  Today we will treat empirically with antibiotic, which we might need to change when urine culture comes back depending of bacteria susceptibility.  Seek immediate medical attention if severe abdominal pain, vomiting, fever/chills, or worsening symptoms. F/U if symptomatic are not any better after 2-3 days of antibiotic treatment.   In general please monitor for signs of worsening symptoms and seek immediate medical attention if any concerning/warning symptom as we discussed.   If symptoms are not resolved in a few days/weeks you should schedule a follow up appointment with your doctor, before if needed.

## 2016-05-13 ENCOUNTER — Telehealth: Payer: Self-pay | Admitting: Family Medicine

## 2016-05-13 NOTE — Telephone Encounter (Signed)
Pt need new Rx for Adderall   Pt is aware of the 3 business day refill protocol pt state that she would like to get it before she leaves to go to [place] from Nov 25 to Dec 4

## 2016-05-16 NOTE — Telephone Encounter (Signed)
Refill OK

## 2016-05-16 NOTE — Telephone Encounter (Signed)
Last OV 04-20-2016  Last refill 02-17-2016 #60, 0rf Please advise

## 2016-05-18 MED ORDER — AMPHETAMINE-DEXTROAMPHETAMINE 20 MG PO TABS: 20.0000 mg | ORAL_TABLET | Freq: Two times a day (BID) | ORAL | 0 refills | Status: DC

## 2016-05-18 NOTE — Telephone Encounter (Signed)
Pt is aware via voicemail that scripts are up front for pick up.

## 2016-05-18 NOTE — Addendum Note (Signed)
Addended by: Elio Forget on: 05/18/2016 06:57 AM   Modules accepted: Orders

## 2016-06-29 ENCOUNTER — Ambulatory Visit (INDEPENDENT_AMBULATORY_CARE_PROVIDER_SITE_OTHER): Payer: Managed Care, Other (non HMO) | Admitting: Family Medicine

## 2016-06-29 ENCOUNTER — Encounter: Payer: Self-pay | Admitting: Family Medicine

## 2016-06-29 VITALS — BP 136/84 | HR 117 | Temp 98.1°F | Wt 236.5 lb

## 2016-06-29 DIAGNOSIS — K439 Ventral hernia without obstruction or gangrene: Secondary | ICD-10-CM

## 2016-06-29 DIAGNOSIS — R5383 Other fatigue: Secondary | ICD-10-CM

## 2016-06-29 LAB — CBC WITH DIFFERENTIAL/PLATELET
Basophils Absolute: 0.1 10*3/uL (ref 0.0–0.1)
Basophils Relative: 0.7 % (ref 0.0–3.0)
EOS ABS: 0.2 10*3/uL (ref 0.0–0.7)
Eosinophils Relative: 1.4 % (ref 0.0–5.0)
HCT: 37.6 % (ref 36.0–46.0)
Hemoglobin: 12.8 g/dL (ref 12.0–15.0)
LYMPHS ABS: 3.3 10*3/uL (ref 0.7–4.0)
Lymphocytes Relative: 24 % (ref 12.0–46.0)
MCHC: 34.2 g/dL (ref 30.0–36.0)
MCV: 82.4 fl (ref 78.0–100.0)
Monocytes Absolute: 1 10*3/uL (ref 0.1–1.0)
Monocytes Relative: 7.4 % (ref 3.0–12.0)
NEUTROS PCT: 66.5 % (ref 43.0–77.0)
Neutro Abs: 9.1 10*3/uL — ABNORMAL HIGH (ref 1.4–7.7)
PLATELETS: 341 10*3/uL (ref 150.0–400.0)
RBC: 4.56 Mil/uL (ref 3.87–5.11)
RDW: 13.4 % (ref 11.5–15.5)
WBC: 13.6 10*3/uL — ABNORMAL HIGH (ref 4.0–10.5)

## 2016-06-29 LAB — BASIC METABOLIC PANEL
BUN: 13 mg/dL (ref 6–23)
CO2: 29 mEq/L (ref 19–32)
Calcium: 9.1 mg/dL (ref 8.4–10.5)
Chloride: 101 mEq/L (ref 96–112)
Creatinine, Ser: 1.02 mg/dL (ref 0.40–1.20)
GFR: 60.09 mL/min (ref >60.00)
GLUCOSE: 92 mg/dL (ref 70–99)
POTASSIUM: 3.4 meq/L — AB (ref 3.5–5.1)
SODIUM: 140 meq/L (ref 135–145)

## 2016-06-29 LAB — TSH: TSH: 5.85 u[IU]/mL — ABNORMAL HIGH (ref 0.35–4.50)

## 2016-06-29 MED ORDER — SUMATRIPTAN SUCCINATE 100 MG PO TABS: 100.0000 mg | ORAL_TABLET | ORAL | 11 refills | Status: DC | PRN

## 2016-06-29 NOTE — Progress Notes (Signed)
Pre visit review using our clinic review tool, if applicable. No additional management support is needed unless otherwise documented below in the visit note. 

## 2016-06-29 NOTE — Progress Notes (Signed)
Subjective:     Patient ID: [name] Sherman, female   DOB: Jan 19, 1961, 56 y.o.   MRN: [id number]  HPI Patient seen today for several issues as follows  She has known history of abdominal wall hernia. This has been evaluated previously by general surgery but she did not have insurance and never had anything done about this. She has had several weeks of some increasing discomfort and pain in the region of her hernia. She has some chronic intermittent constipation issues. She would like to see general surgery again  Patient relates increased fatigue over the past several months. She's had difficulty losing weight. She has obstructive sleep apnea uses CPAP nightly. She has hypertension which has been controlled with losartan HCTZ. Strong family history of hypothyroidism.  History of migraine headaches. Triggered frequently by perfumes and smells things like smoke. Requesting refills of Imitrex.  Past Medical History:  Diagnosis Date  . ADD (attention deficit disorder)   . History of cervical cancer    s/p  laser ablation of cervix 1980's  . Hyperlipidemia   . Left ureteral stone   . Migraine   . Nephrolithiasis    bilateral nonobstructive per ct  . OSA (obstructive sleep apnea)    pt used cpap up until 2013 states lost wt and did not need anymore  . Umbilical hernia without obstruction or gangrene   . Urgency of urination   . Vitamin D deficiency    Past Surgical History:  Procedure Laterality Date  . ABDOMINAL HYSTERECTOMY    . BUNIONECTOMY Right 2004  . CYSTOSCOPY W/ RETROGRADES Bilateral 05/26/2015   Procedure: CYSTOSCOPY WITH RETROGRADE PYELOGRAM;  Surgeon: Festus Aloe, MD;  Location: [facility];  Service: Urology;  Laterality: Bilateral;  . CYSTOSCOPY W/ URETERAL STENT REMOVAL Left 05/26/2015   Procedure: CYSTOSCOPY WITH STENT REMOVAL;  Surgeon: Festus Aloe, MD;  Location: [facility];  Service: Urology;  Laterality: Left;  . CYSTOSCOPY  WITH RETROGRADE PYELOGRAM, URETEROSCOPY AND STENT PLACEMENT Left 05/01/2015   Procedure: CYSTOSCOPY WITH RETROGRADE PYELOGRAM AND STENT PLACEMENT;  Surgeon: Festus Aloe, MD;  Location: [facility];  Service: Urology;  Laterality: Left;  . CYSTOSCOPY WITH STENT PLACEMENT Bilateral 05/26/2015   Procedure: CYSTOSCOPY WITH STENT PLACEMENT;  Surgeon: Festus Aloe, MD;  Location: [facility];  Service: Urology;  Laterality: Bilateral;  . CYSTOSCOPY WITH URETEROSCOPY Bilateral 05/26/2015   Procedure: CYSTOSCOPY WITH URETEROSCOPY;  Surgeon: Festus Aloe, MD;  Location: [facility];  Service: Urology;  Laterality: Bilateral;  . CYSTOSCOPY/URETEROSCOPY/HOLMIUM LASER/STENT PLACEMENT Right 06/09/2015   Procedure: CYSTOSCOPY/URETEROSCOPY/STENT PLACEMENT REMOVAL LEFT URETERAL STENT;  Surgeon: Festus Aloe, MD;  Location: [facility];  Service: Urology;  Laterality: Right;  . HOLMIUM LASER APPLICATION Left [id number]   Procedure: HOLMIUM LASER APPLICATION;  Surgeon: Festus Aloe, MD;  Location: [facility];  Service: Urology;  Laterality: Left;  . HOLMIUM LASER APPLICATION Right [id number]   Procedure: HOLMIUM LASER APPLICATION;  Surgeon: Festus Aloe, MD;  Location: [facility];  Service: Urology;  Laterality: Right;  . LAPAROSCOPIC ASSISTED VAGINAL HYSTERECTOMY  04-23-2002   and Left Salpingoophorectomy/  Anterior Repair/  Tension Free Tape Sling placement  . LASER ABLATION OF THE CERVIX  x2  1980's  . TUBAL LIGATION  1980's    reports that she has never smoked. She has never used smokeless tobacco. She reports that she does not drink alcohol or use drugs. family history includes Alcohol abuse in her mother; Arthritis in her father; Cancer in her  maternal grandmother and paternal grandmother; Heart disease in her father; Hyperlipidemia in her father; Hypertension in her father; Kidney disease in her paternal uncle. No Known Allergies   Review of Systems   Constitutional: Positive for fatigue. Negative for appetite change, chills, fever and unexpected weight change.  Eyes: Negative for visual disturbance.  Respiratory: Negative for cough, chest tightness, shortness of breath and wheezing.   Cardiovascular: Negative for chest pain, palpitations and leg swelling.  Gastrointestinal: Positive for abdominal pain and constipation. Negative for nausea and vomiting.  Genitourinary: Negative for dysuria.  Neurological: Positive for headaches. Negative for dizziness, seizures, syncope, weakness and light-headedness.       Objective:   Physical Exam  Constitutional: She appears well-developed and well-nourished.  Neck: Neck supple. No thyromegaly present.  Cardiovascular: Normal rate and regular rhythm.   Pulmonary/Chest: Effort normal and breath sounds normal. No respiratory distress. She has no wheezes. She has no rales.  Abdominal: Soft.  Patient has abdominal wall hernia which is minimally tender but soft  Musculoskeletal: She exhibits no edema.       Assessment:     #1 abdominal wall hernia. Becoming more symptomatic recently.  #2 history of migraine headaches  #3 hypertension stable  #4 fatigue. Etiology unclear. She does have obstructive sleep apnea but consistently uses CPAP    Plan:     -Discussed importance of adequate sleep -Check labs with CBC, TSH, basic metabolic panel -Set of referral to general surgery regarding her abdominal wall hernia -Refill Imitrex for one year -Patient is asked to confirm date of last colonoscopy and their recommendations for follow-up interval. She thinks this was 4 or 5 years ago  [name] MD [facility]

## 2016-06-29 NOTE — Patient Instructions (Signed)
Ventral Hernia A ventral hernia is a bulge of tissue from inside the abdomen that pushes through a weak area of the muscles that form the front wall of the abdomen. The tissues inside the abdomen are inside a sac (peritoneum). These tissues include the small intestine, large intestine, and the fatty tissue that covers the intestines (omentum). Sometimes, the bulge that forms a hernia contains intestines. Other hernias contain only fat. Ventral hernias do not go away without surgical treatment. There are several types of ventral hernias. You may have:  A hernia at an incision site from previous abdominal surgery (incisional hernia).  A hernia just above the belly button (epigastric hernia), or at the belly button (umbilical hernia). These types of hernias can develop from heavy lifting or straining.  A hernia that comes and goes (reducible hernia). It may be visible only when you lift or strain. This type of hernia can be pushed back into the abdomen (reduced).  A hernia that traps abdominal tissue inside the hernia (incarcerated hernia). This type of hernia does not reduce.  A hernia that cuts off blood flow to the tissues inside the hernia (strangulated hernia). The tissues can start to die if this happens. This is a very painful bulge that cannot be reduced. A strangulated hernia is a medical emergency. What are the causes? This condition is caused by abdominal tissue putting pressure on an area of weakness in the abdominal muscles. What increases the risk? The following factors may make you more likely to develop this condition:  Being female.  Being 60 or older.  Being overweight or obese.  Having had previous abdominal surgery, especially if there was an infection after surgery.  Having had an injury to the abdominal wall.  Having had several pregnancies.  Having a buildup of fluid inside the abdomen (ascites). What are the signs or symptoms? The only symptom of a ventral hernia  may be a painless bulge in the abdomen. A reducible hernia may be visible only when you strain, cough, or lift. Other symptoms may include:  Dull pain.  A feeling of pressure. Signs and symptoms of a strangulated hernia may include:  Increasing pain.  Nausea and vomiting.  Pain when pressing on the hernia.  The skin over the hernia turning red or purple.  Constipation.  Blood in the stool (feces). How is this diagnosed? This condition may be diagnosed based on:  Your symptoms.  Your medical history.  A physical exam. You may be asked to cough or strain while standing. These actions increase the pressure inside your abdomen and force the hernia through the opening in your muscles. Your health care provider may try to reduce the hernia by pressing on it.  Imaging studies, such as an ultrasound or CT scan. How is this treated? This condition is treated with surgery. If you have a strangulated hernia, surgery is done as soon as possible. If your hernia is small and not incarcerated, you may be asked to lose some weight before surgery. Follow these instructions at home:  Follow instructions from your health care provider about eating or drinking restrictions.  If you are overweight, your health care provider may recommend that you increase your activity level and eat a healthier diet.  Do not lift anything that is heavier than 10 lb (4.5 kg).  Return to your normal activities as told by your health care provider. Ask your health care provider what activities are safe for you. You may need to avoid activities that   increase pressure on your hernia.  Take over-the-counter and prescription medicines only as told by your health care provider.  Keep all follow-up visits as told by your health care provider. This is important. Contact a health care provider if:  Your hernia gets larger.  Your hernia becomes painful. Get help right away if:  Your hernia becomes increasingly  painful.  You have pain along with any of the following:  Changes in skin color in the area of the hernia.  Nausea.  Vomiting.  Fever. Summary  A ventral hernia is a bulge of tissue from inside the abdomen that pushes through a weak area of the muscles that form the front wall of the abdomen.  This condition is treated with surgery, which may be urgent depending on your hernia.  Do not lift anything that is heavier than 10 lb (4.5 kg), and follow activity instructions from your health care provider. This information is not intended to replace advice given to you by your health care provider. Make sure you discuss any questions you have with your health care provider. Document Released: 05/30/2012 Document Revised: 01/29/2016 Document Reviewed: 01/29/2016 Elsevier Interactive Patient Education  2017 [employer]  

## 2016-06-30 ENCOUNTER — Other Ambulatory Visit: Payer: Self-pay

## 2016-06-30 ENCOUNTER — Telehealth: Payer: Self-pay | Admitting: Family Medicine

## 2016-06-30 DIAGNOSIS — E876 Hypokalemia: Secondary | ICD-10-CM

## 2016-06-30 DIAGNOSIS — E039 Hypothyroidism, unspecified: Secondary | ICD-10-CM

## 2016-06-30 MED ORDER — LEVOTHYROXINE SODIUM 50 MCG PO TABS: 50.0000 ug | ORAL_TABLET | Freq: Every day | ORAL | 1 refills | Status: DC

## 2016-06-30 NOTE — Telephone Encounter (Signed)
See result note.  

## 2016-06-30 NOTE — Telephone Encounter (Signed)
Pt is returning autumn call  °

## 2016-07-05 ENCOUNTER — Encounter: Payer: Self-pay | Admitting: Family Medicine

## 2016-07-05 ENCOUNTER — Ambulatory Visit (INDEPENDENT_AMBULATORY_CARE_PROVIDER_SITE_OTHER): Payer: Managed Care, Other (non HMO) | Admitting: Family Medicine

## 2016-07-05 VITALS — BP 132/68 | HR 103 | Temp 99.0°F | Ht 65.0 in | Wt 238.0 lb

## 2016-07-05 DIAGNOSIS — J069 Acute upper respiratory infection, unspecified: Secondary | ICD-10-CM | POA: Diagnosis not present

## 2016-07-05 DIAGNOSIS — B9789 Other viral agents as the cause of diseases classified elsewhere: Secondary | ICD-10-CM | POA: Diagnosis not present

## 2016-07-05 DIAGNOSIS — M545 Low back pain: Secondary | ICD-10-CM | POA: Diagnosis not present

## 2016-07-05 LAB — POCT URINALYSIS DIPSTICK
Bilirubin, UA: NEGATIVE
Blood, UA: NEGATIVE
GLUCOSE UA: NEGATIVE
Ketones, UA: NEGATIVE
LEUKOCYTES UA: NEGATIVE
Nitrite, UA: NEGATIVE
PROTEIN UA: NEGATIVE
SPEC GRAV UA: 1.02
UROBILINOGEN UA: 1
pH, UA: 6.5

## 2016-07-05 MED ORDER — HYDROCOD POLST-CPM POLST ER 10-8 MG/5ML PO SUER: 5.0000 mL | Freq: Two times a day (BID) | ORAL | 0 refills | Status: DC | PRN

## 2016-07-05 NOTE — Progress Notes (Signed)
Subjective:     Patient ID: [name] Sherman, female   DOB: 01/01/1961, 56 y.o.   MRN: [id number]  HPI Patient seen as a work in. She's had couple a history of productive cough. She feels that she may have some intermittent wheezing. She's had some nasal congestion increased fatigue and mild body aches. Subjective low-grade fever but not definitive. Mild sore throat. Mild left earache. Denies any nausea, vomiting, or diarrhea. Her cough is been severe at night. She's tried Robitussin-DM and Mucinex DM without much relief. No history of asthma. Nonsmoker.  She is complaining of some left lower back pain. No dysuria. She's not had any urine frequency or burning with urination. No hematuria. Back pain worse with movement. Exacerbated by coughing. No recent injury.  Past Medical History:  Diagnosis Date  . ADD (attention deficit disorder)   . History of cervical cancer    s/p  laser ablation of cervix 1980's  . Hyperlipidemia   . Left ureteral stone   . Migraine   . Nephrolithiasis    bilateral nonobstructive per ct  . OSA (obstructive sleep apnea)    pt used cpap up until 2013 states lost wt and did not need anymore  . Umbilical hernia without obstruction or gangrene   . Urgency of urination   . Vitamin D deficiency    Past Surgical History:  Procedure Laterality Date  . ABDOMINAL HYSTERECTOMY    . BUNIONECTOMY Right 2004  . CYSTOSCOPY W/ RETROGRADES Bilateral 05/26/2015   Procedure: CYSTOSCOPY WITH RETROGRADE PYELOGRAM;  Surgeon: Festus Aloe, MD;  Location: [facility];  Service: Urology;  Laterality: Bilateral;  . CYSTOSCOPY W/ URETERAL STENT REMOVAL Left 05/26/2015   Procedure: CYSTOSCOPY WITH STENT REMOVAL;  Surgeon: Festus Aloe, MD;  Location: [facility];  Service: Urology;  Laterality: Left;  . CYSTOSCOPY WITH RETROGRADE PYELOGRAM, URETEROSCOPY AND STENT PLACEMENT Left 05/01/2015   Procedure: CYSTOSCOPY WITH RETROGRADE PYELOGRAM AND STENT  PLACEMENT;  Surgeon: Festus Aloe, MD;  Location: [facility];  Service: Urology;  Laterality: Left;  . CYSTOSCOPY WITH STENT PLACEMENT Bilateral 05/26/2015   Procedure: CYSTOSCOPY WITH STENT PLACEMENT;  Surgeon: Festus Aloe, MD;  Location: [facility];  Service: Urology;  Laterality: Bilateral;  . CYSTOSCOPY WITH URETEROSCOPY Bilateral 05/26/2015   Procedure: CYSTOSCOPY WITH URETEROSCOPY;  Surgeon: Festus Aloe, MD;  Location: [facility];  Service: Urology;  Laterality: Bilateral;  . CYSTOSCOPY/URETEROSCOPY/HOLMIUM LASER/STENT PLACEMENT Right 06/09/2015   Procedure: CYSTOSCOPY/URETEROSCOPY/STENT PLACEMENT REMOVAL LEFT URETERAL STENT;  Surgeon: Festus Aloe, MD;  Location[facility];  Service: Urology;  Laterality: Right;  . HOLMIUM LASER APPLICATION Left [id number]   Procedure: HOLMIUM LASER APPLICATION;  Surgeon: Festus Aloe, MD;  Location: [facility];  Service: Urology;  Laterality: Left;  . HOLMIUM LASER APPLICATION Right 0000000   Procedure: HOLMIUM LASER APPLICATION;  Surgeon: Festus Aloe, MD;  Location: [facility];  Service: Urology;  Laterality: Right;  . LAPAROSCOPIC ASSISTED VAGINAL HYSTERECTOMY  04-23-2002   and Left Salpingoophorectomy/  Anterior Repair/  Tension Free Tape Sling placement  . LASER ABLATION OF THE CERVIX  x2  1980's  . TUBAL LIGATION  1980's    reports that she has never smoked. She has never used smokeless tobacco. She reports that she does not drink alcohol or use drugs. family history includes Alcohol abuse in her mother; Arthritis in her father; Cancer in her maternal grandmother and paternal grandmother; Heart disease in her father; Hyperlipidemia in her father; Hypertension in her father;  Kidney disease in her paternal uncle. No Known Allergies   Review of Systems  Constitutional: Positive for fatigue and fever.  HENT: Positive for congestion and sore throat.   Respiratory: Positive for cough.    Genitourinary: Negative for dysuria.  Musculoskeletal: Positive for back pain.  Skin: Negative for rash.       Objective:   Physical Exam  Constitutional: She appears well-developed and well-nourished.  HENT:  Right Ear: External ear normal.  Left Ear: External ear normal.  Minimal posterior pharynx erythema without exudate  Neck: Neck supple.  Cardiovascular: Normal rate and regular rhythm.   Pulmonary/Chest: Effort normal and breath sounds normal. No respiratory distress. She has no wheezes. She has no rales.  Lymphadenopathy:    She has no cervical adenopathy.       Assessment:     Probable viral URI with cough. Nonfocal exam. Urine dipstick is completely normal. Cough not relieved with over-the-counter medications    Plan:     -No indication for antibiotics at this time but follow-up promptly for increasing fever or worsening symptoms -Tussionex 1 teaspoon daily at bedtime for severe cough 120 mL's with no refill  [name] MD [facility]

## 2016-07-05 NOTE — Patient Instructions (Signed)
Follow up for any increased fever or increasing shortness of breath.   

## 2016-07-22 ENCOUNTER — Other Ambulatory Visit: Payer: Self-pay | Admitting: Family Medicine

## 2016-08-04 ENCOUNTER — Ambulatory Visit: Payer: Self-pay | Admitting: Surgery

## 2016-08-04 NOTE — H&P (Signed)
[name] Sherman 08/04/2016 8:59 AM Location: [facility] Patient #: [id number] DOB: 1960/10/24 Married / Language: English / Race: White Female  History of Present Illness ([name] A. [name] MD; 08/04/2016 9:33 AM) Patient words: This is a very nice 56 year old woman who presents with a long-standing supraumbilical hernia. It has not increased in size, but it has started to cause her intermittent pain. She also notes intermittent nausea and vomiting. She had previously had issues with constipation but reports that these have resolved at this point. The hernia is never reducible but increases in prominence when she flexes her abdominal wall. She is interested in having it repaired.  She has a history of sleep apnea and kidney stones as well as hypertension and hyperlipidemia. She is status post tubal ligation and laparoscopic assisted hysterectomy, as well as multiple cystoscopies with stent placement and exchanges. It looks like she had a colonoscopy in January 2013 but are not able to review the results in her system. She thinks she had a couple of benign polyps and was told to return in 7-10 years.  She is here today with her husband. She has never smoked. 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  The patient is a 56 year old female.   Past Surgical History [name] Sherman, [place]; 08/04/2016 9:00 AM) Foot Surgery Right. Hysterectomy (not due to cancer) - Partial Oral Surgery  Diagnostic Studies History [name] Sherman, [place]; 08/04/2016 9:00 AM) Colonoscopy 5-10 years ago Mammogram 1-3 years ago Pap Smear >5 years ago  Allergies [name] Sherman, CMA; 08/04/2016 9:00 AM) No Known Drug Allergies 08/04/2016 Allergies Reconciled  Medication History [name] Sherman, CMA; 08/04/2016 9:03 AM) Adderall (20MG  Tablet, Oral daily) Active. Lipitor (10MG  Tablet, Oral daily)  Active. Chlorpheniramine-Hydrocodone (2-2.5MG /5ML Liquid, Oral as needed) Active. CeleXA (20MG  Tablet, Oral daily) Active. Ibuprofen (800MG  Tablet, Oral as needed) Active. Synthroid (50MCG Tablet, Oral daily) Active. Hyzaar (50-12.5MG  Tablet, Oral daily) Active. Imitrex (100MG  Tablet, Oral daily) Active. Vitamin D (50000U Capsule, Oral daily) Active. Medications Reconciled  Social History [name] Sherman, [place]; 08/04/2016 9:00 AM) Alcohol use Occasional alcohol use. Caffeine use Carbonated beverages, Coffee, Tea. No drug use Tobacco use Never smoker.  Family History [name] Sherman, [place]; 08/04/2016 9:00 AM) Alcohol Abuse Mother. Arthritis Father, Mother, Sister. Colon Cancer Family Members In General. Colon Polyps Family Members In General, Father. Depression Mother. Heart disease in female family member before age 57 Hypertension Father, Sister. Melanoma Father, Sister. Migraine Headache Father, Sister. Respiratory Condition Father. Thyroid problems Sister.  Pregnancy / Birth History [name] Sherman, [place]; 08/04/2016 9:00 AM) Age at menarche 89 years. Gravida 3 Irregular periods Maternal age 53-20 Para 2  Other Problems [name] Sherman, CMA; 08/04/2016 9:00 AM) Arthritis Back Pain Cervical Cancer Hemorrhoids High blood pressure Hypercholesterolemia Kidney Stone Migraine Headache Oophorectomy Left. Sleep Apnea Ventral Hernia Repair     Review of Systems (Sade [name]; 08/04/2016 9:00 AM) General Present- Fatigue and Weight Gain. Not Present- Appetite Loss, Chills, Fever, Night Sweats and Weight Loss. Skin Not Present- Change in Wart/Mole, Dryness, Hives, Jaundice, New Lesions, Non-Healing Wounds, Rash and Ulcer. HEENT Not Present- Earache, Hearing Loss, Hoarseness, Nose Bleed, Oral Ulcers, Ringing in the Ears, Seasonal Allergies, Sinus Pain, Sore Throat, Visual Disturbances, Wears glasses/contact lenses and Yellow Eyes. Breast Not  Present- Breast Mass, Breast Pain, Nipple Discharge and Skin Changes. Cardiovascular Not Present- Chest Pain, Difficulty Breathing Lying Down, Leg  Cramps, Palpitations, Rapid Heart Rate, Shortness of Breath and Swelling of Extremities. Gastrointestinal Present- Abdominal Pain, Hemorrhoids, Indigestion and Nausea. Not Present- Bloating, Bloody Stool, Change in Bowel Habits, Chronic diarrhea, Constipation, Difficulty Swallowing, Excessive gas, Gets full quickly at meals, Rectal Pain and Vomiting. Female Genitourinary Not Present- Frequency, Nocturia, Painful Urination, Pelvic Pain and Urgency. Neurological Not Present- Decreased Memory, Fainting, Headaches, Numbness, Seizures, Tingling, Tremor, Trouble walking and Weakness. Psychiatric Not Present- Anxiety, Bipolar, Change in Sleep Pattern, Depression, Fearful and Frequent crying. Endocrine Not Present- Cold Intolerance, Excessive Hunger, Hair Changes, Heat Intolerance, Hot flashes and New Diabetes. Hematology Not Present- Blood Thinners, Easy Bruising, Excessive bleeding, Gland problems, HIV and Persistent Infections.  Vitals Bary Castilla [name]; 08/04/2016 9:03 AM) 08/04/2016 9:03 AM Weight: 236 lb Height: 64in Body Surface Area: 2.1 m Body Mass Index: 40.51 kg/m  Temp.: 59F  Pulse: 82 (Regular)  BP: 122/82 (Sitting, Left Arm, Standard)      Physical Exam (Adolph [name]; 08/04/2016 9:32 AM)  General Note: Alert and cooperative  Integumentary Note: No lesions or rashes on limited skin exam  Head and Neck Note: no mass or thyromegaly  Eye Note: anicteric, extraocular motion intact  ENMT Note: moist mucus membranes, good dentition  Chest and Lung Exam Note: unlabored respirations, symmetrical air entry  Cardiovascular Note: regular rate and rhythm, no pedal edema  Abdomen Note: obese, nontender, nondistended. supraumbilical hernia with incarcerated fat; fascial edge not palpable. small umbilical hernia,  reduced  Neurologic Note: grossly intact, normal gait  Neuropsychiatric Note: normal mood and affect, appropriate insight  Musculoskeletal Note: strength symmetrical throughout, no deformity    Assessment & Plan (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 we discussed laparoscopic repair. I informed her of the nature of the surgery, the use of mesh, the risks of surgery including bleeding, infection, pain, scarring, intra-abdominal injury, ileus, and hernia recurrence. We discussed that it would be highly beneficial if she were able to lose some weight prior to surgery with ongoing efforts afterwards in order to decrease her centripetal obesity and reduce her risk of hernia recurrence. We discussed that it will likely be an outpatient surgery, but if she does have a lot of intra-abdominal adhesive disease from her prior gynecological surgery that she may need to spend the night in the hospital for pain control and monitoring. She has been had several appropriate and insightful questions. They asked for the name of the surgery and the CPT code which I provided so that they could inquire with her insurance company about her costs. We'll plan to schedule in the coming weeks.  Current Plans You are being scheduled for surgery- Our schedulers will call you.  You should hear from our office's scheduling department within 5 working days about the location, date, and time of surgery. We try to make accommodations for patient's preferences in scheduling surgery, but sometimes the OR schedule or the surgeon's schedule prevents [place] from making those accommodations.  If you have not heard from our office [phone]) in 5 working days, call the office and ask for your surgeon's nurse.  If you have other questions about your diagnosis, plan, or surgery, call the office and ask for your surgeon's  nurse.  UMBILICAL HERNIA WITHOUT OBSTRUCTION AND WITHOUT GANGRENE (K42.9

## 2016-08-09 ENCOUNTER — Other Ambulatory Visit: Payer: Self-pay | Admitting: Family Medicine

## 2016-08-11 ENCOUNTER — Telehealth: Payer: Self-pay | Admitting: Family Medicine

## 2016-08-11 NOTE — Telephone Encounter (Signed)
° °  Pt request refill of the following:  amphetamine-dextroamphetamine (ADDERALL) 20 MG tabletaut   Phamacy:

## 2016-08-12 NOTE — Telephone Encounter (Signed)
Pt is due 08/18/2016.

## 2016-08-16 ENCOUNTER — Ambulatory Visit (INDEPENDENT_AMBULATORY_CARE_PROVIDER_SITE_OTHER): Payer: Managed Care, Other (non HMO) | Admitting: Family Medicine

## 2016-08-16 VITALS — BP 136/100 | HR 79 | Temp 98.1°F | Ht 65.0 in | Wt 234.0 lb

## 2016-08-16 DIAGNOSIS — R05 Cough: Secondary | ICD-10-CM | POA: Diagnosis not present

## 2016-08-16 DIAGNOSIS — J040 Acute laryngitis: Secondary | ICD-10-CM | POA: Diagnosis not present

## 2016-08-16 DIAGNOSIS — R059 Cough, unspecified: Secondary | ICD-10-CM

## 2016-08-16 MED ORDER — AMPHETAMINE-DEXTROAMPHETAMINE 20 MG PO TABS: 20.0000 mg | ORAL_TABLET | Freq: Two times a day (BID) | ORAL | 0 refills | Status: DC

## 2016-08-16 MED ORDER — DOXYCYCLINE HYCLATE 100 MG PO CAPS: 100.0000 mg | ORAL_CAPSULE | Freq: Two times a day (BID) | ORAL | 0 refills | Status: DC

## 2016-08-16 MED ORDER — ALBUTEROL SULFATE HFA 108 (90 BASE) MCG/ACT IN AERS: 2.0000 | INHALATION_SPRAY | Freq: Four times a day (QID) | RESPIRATORY_TRACT | 2 refills | Status: DC | PRN

## 2016-08-16 NOTE — Progress Notes (Signed)
Pre visit review using our clinic review tool, if applicable. No additional management support is needed unless otherwise documented below in the visit note. 

## 2016-08-16 NOTE — Telephone Encounter (Signed)
Pt was seen in the office today 08/16/2016. Scripts printed then.

## 2016-08-16 NOTE — Patient Instructions (Signed)

## 2016-08-16 NOTE — Progress Notes (Signed)
Subjective:     Patient ID: [name] Sherman, female   DOB: 1960-10-14, 56 y.o.   MRN: [id number]  HPI Patient seen with productive cough and increased malaise over the past several days. She was seen back in January and states that that cough did improve and eventually resolved but she's now had a few weeks of progressive cough. She feels that she may have some subjective wheezing at night intermittently. No documented fever. No chills. No hemoptysis. No appetite or weight changes. She has never smoked. No history of asthma.  Denies any sinusitis symptoms such as frequent headache, facial pain, upper teeth pain, etc.  Past Medical History:  Diagnosis Date  . ADD (attention deficit disorder)   . History of cervical cancer    s/p  laser ablation of cervix 1980's  . Hyperlipidemia   . Left ureteral stone   . Migraine   . Nephrolithiasis    bilateral nonobstructive per ct  . OSA (obstructive sleep apnea)    pt used cpap up until 2013 states lost wt and did not need anymore  . Umbilical hernia without obstruction or gangrene   . Urgency of urination   . Vitamin D deficiency    Past Surgical History:  Procedure Laterality Date  . ABDOMINAL HYSTERECTOMY    . BUNIONECTOMY Right 2004  . CYSTOSCOPY W/ RETROGRADES Bilateral 05/26/2015   Procedure: CYSTOSCOPY WITH RETROGRADE PYELOGRAM;  Surgeon: Festus Aloe, MD;  Location: [facility];  Service: Urology;  Laterality: Bilateral;  . CYSTOSCOPY W/ URETERAL STENT REMOVAL Left 05/26/2015   Procedure: CYSTOSCOPY WITH STENT REMOVAL;  Surgeon: Festus Aloe, MD;  Location: [facility];  Service: Urology;  Laterality: Left;  . CYSTOSCOPY WITH RETROGRADE PYELOGRAM, URETEROSCOPY AND STENT PLACEMENT Left 05/01/2015   Procedure: CYSTOSCOPY WITH RETROGRADE PYELOGRAM AND STENT PLACEMENT;  Surgeon: Festus Aloe, MD;  Location[facility];  Service: Urology;  Laterality: Left;  . CYSTOSCOPY WITH STENT PLACEMENT Bilateral  05/26/2015   Procedure: CYSTOSCOPY WITH STENT PLACEMENT;  Surgeon: Festus Aloe, MD;  Location[facility];  Service: Urology;  Laterality: Bilateral;  . CYSTOSCOPY WITH URETEROSCOPY Bilateral 05/26/2015   Procedure: CYSTOSCOPY WITH URETEROSCOPY;  Surgeon: Festus Aloe, MD;  Location: [facility];  Service: Urology;  Laterality: Bilateral;  . CYSTOSCOPY/URETEROSCOPY/HOLMIUM LASER/STENT PLACEMENT Right 06/09/2015   Procedure: CYSTOSCOPY/URETEROSCOPY/STENT PLACEMENT REMOVAL LEFT URETERAL STENT;  Surgeon: Festus Aloe, MD;  [facility];  Service: Urology;  Laterality: Right;  . HOLMIUM LASER APPLICATION Left [id number]   Procedure: HOLMIUM LASER APPLICATION;  Surgeon: Festus Aloe, MD;  Location: [facility];  Service: Urology;  Laterality: Left;  . HOLMIUM LASER APPLICATION Right 0000000   Procedure: HOLMIUM LASER APPLICATION;  Surgeon: Festus Aloe, MD;  Location[facility];  Service: Urology;  Laterality: Right;  . LAPAROSCOPIC ASSISTED VAGINAL HYSTERECTOMY  04-23-2002   and Left Salpingoophorectomy/  Anterior Repair/  Tension Free Tape Sling placement  . LASER ABLATION OF THE CERVIX  x2  1980's  . TUBAL LIGATION  1980's    reports that she has never smoked. She has never used smokeless tobacco. She reports that she does not drink alcohol or use drugs. family history includes Alcohol abuse in her mother; Arthritis in her father; Cancer in her maternal grandmother and paternal grandmother; Heart disease in her father; Hyperlipidemia in her father; Hypertension in her father; Kidney disease in her paternal uncle. Allergies  Allergen Reactions  . Keflex [Cephalexin] Swelling     Review of Systems  Constitutional: Negative for appetite change, chills, fever and unexpected weight change.  Respiratory: Positive for cough.   Cardiovascular: Negative for chest pain.       Objective:   Physical Exam  Constitutional: She  appears well-developed and well-nourished.  HENT:  Right Ear: External ear normal.  Left Ear: External ear normal.  Mouth/Throat: Oropharynx is clear and moist.  Neck: Neck supple.  Cardiovascular: Normal rate and regular rhythm.   Pulmonary/Chest: Effort normal and breath sounds normal. No respiratory distress. She has no wheezes. She has no rales.  Lymphadenopathy:    She has no cervical adenopathy.       Assessment:     Productive cough.  She describes some intermittent wheezing, though none noted on exam today.    Plan:     -Given duration of symptoms start doxycycline 100 mg twice a day for 10 days -Albuterol inhaler 2 puffs every 4 hours as needed for cough and wheeze -Follow-up promptly for any fever, shortness of breath, or if cough not resolving over the next 1-2 weeks -CXR is not improved in 1-2 weeks.  [name] MD [facility]

## 2016-08-23 ENCOUNTER — Encounter: Payer: Self-pay | Admitting: Family Medicine

## 2016-08-23 ENCOUNTER — Ambulatory Visit (INDEPENDENT_AMBULATORY_CARE_PROVIDER_SITE_OTHER)
Admission: RE | Admit: 2016-08-23 | Discharge: 2016-08-23 | Disposition: A | Discharge status: Home / Self Care | Payer: Managed Care, Other (non HMO) | Source: Ambulatory Visit | Attending: Family Medicine | Admitting: Family Medicine

## 2016-08-23 ENCOUNTER — Ambulatory Visit (INDEPENDENT_AMBULATORY_CARE_PROVIDER_SITE_OTHER): Payer: Managed Care, Other (non HMO) | Admitting: Family Medicine

## 2016-08-23 VITALS — BP 140/100 | HR 97 | Temp 98.3°F

## 2016-08-23 DIAGNOSIS — R059 Cough, unspecified: Secondary | ICD-10-CM

## 2016-08-23 DIAGNOSIS — R05 Cough: Secondary | ICD-10-CM

## 2016-08-23 DIAGNOSIS — R062 Wheezing: Secondary | ICD-10-CM | POA: Diagnosis not present

## 2016-08-23 MED ORDER — HYDROCOD POLST-CPM POLST ER 10-8 MG/5ML PO SUER: 5.0000 mL | Freq: Every evening | ORAL | 0 refills | Status: DC | PRN

## 2016-08-23 MED ORDER — PREDNISONE 10 MG PO TABS: ORAL_TABLET | ORAL | 0 refills | Status: DC

## 2016-08-23 NOTE — Patient Instructions (Signed)
Go for CXR Follow up for any fever or increased of shortness of breath.

## 2016-08-23 NOTE — Progress Notes (Signed)
Subjective:     Patient ID: [name] Sherman, female   DOB: [birth year], 56 y.o.   MRN: [id number]  HPI Patient is nonsmoker who is seen with persistent cough which has actually worsened since last visit. She had some URI symptoms back in January but seemed to be recovering and then around the first of this month noted recurrent cough. Her cough is mostly dry and occasionally productive. She's also had some progressive laryngitis symptoms over the past several days. No hemoptysis. No appetite or weight changes. She feels that she has some wheezing which is intermittent. She's tried albuterol without relief. Denies any GERD symptoms. Not using her CPAP secondary to severe coughing recently. Occasional nighttime postnasal drip symptoms. She is on day 8 of doxycycline.  Past Medical History:  Diagnosis Date  . ADD (attention deficit disorder)   . History of cervical cancer    s/p  laser ablation of cervix 1980's  . Hyperlipidemia   . Left ureteral stone   . Migraine   . Nephrolithiasis    bilateral nonobstructive per ct  . OSA (obstructive sleep apnea)    pt used cpap up until 2013 states lost wt and did not need anymore  . Umbilical hernia without obstruction or gangrene   . Urgency of urination   . Vitamin D deficiency    Past Surgical History:  Procedure Laterality Date  . ABDOMINAL HYSTERECTOMY    . BUNIONECTOMY Right 2004  . CYSTOSCOPY W/ RETROGRADES Bilateral 05/26/2015   Procedure: CYSTOSCOPY WITH RETROGRADE PYELOGRAM;  Surgeon: Festus Aloe, MD;  Location: [facility];  Service: Urology;  Laterality: Bilateral;  . CYSTOSCOPY W/ URETERAL STENT REMOVAL Left 05/26/2015   Procedure: CYSTOSCOPY WITH STENT REMOVAL;  Surgeon: Festus Aloe, MD;  [facility];  Service: Urology;  Laterality: Left;  . CYSTOSCOPY WITH RETROGRADE PYELOGRAM, URETEROSCOPY AND STENT PLACEMENT Left 05/01/2015   Procedure: CYSTOSCOPY WITH RETROGRADE PYELOGRAM AND STENT  PLACEMENT;  Surgeon: Festus Aloe, MD;  [facility];  Service: Urology;  Laterality: Left;  . CYSTOSCOPY WITH STENT PLACEMENT Bilateral 05/26/2015   Procedure: CYSTOSCOPY WITH STENT PLACEMENT;  Surgeon: Festus Aloe, MD;  [facility];  Service: Urology;  Laterality: Bilateral;  . CYSTOSCOPY WITH URETEROSCOPY Bilateral 05/26/2015   Procedure: CYSTOSCOPY WITH URETEROSCOPY;  Surgeon: Festus Aloe, MD;  Location: [facility];  Service: Urology;  Laterality: Bilateral;  . CYSTOSCOPY/URETEROSCOPY/HOLMIUM LASER/STENT PLACEMENT Right 06/09/2015   Procedure: CYSTOSCOPY/URETEROSCOPY/STENT PLACEMENT REMOVAL LEFT URETERAL STENT;  Surgeon: Festus Aloe, MD;  [facility];  Service: Urology;  Laterality: Right;  . HOLMIUM LASER APPLICATION Left [id number]   Procedure: HOLMIUM LASER APPLICATION;  Surgeon: Festus Aloe, MD;  Location[facility];  Service: Urology;  Laterality: Left;  . HOLMIUM LASER APPLICATION Right 0000000   Procedure: HOLMIUM LASER APPLICATION;  Surgeon: Festus Aloe, MD;  Location[facility];  Service: Urology;  Laterality: Right;  . LAPAROSCOPIC ASSISTED VAGINAL HYSTERECTOMY  04-23-2002   and Left Salpingoophorectomy/  Anterior Repair/  Tension Free Tape Sling placement  . LASER ABLATION OF THE CERVIX  x2  1980's  . TUBAL LIGATION  1980's    reports that she has never smoked. She has never used smokeless tobacco. She reports that she does not drink alcohol or use drugs. family history includes Alcohol abuse in her mother; Arthritis in her father; Cancer in her maternal grandmother and paternal grandmother; Heart disease in her father; Hyperlipidemia in her father; Hypertension in her father; Kidney  disease in her paternal uncle. Allergies  Allergen Reactions  . Keflex [Cephalexin] Swelling     Review of Systems  Constitutional: Positive for fatigue. Negative for appetite change, chills, fever and  unexpected weight change.  HENT: Positive for postnasal drip and voice change.   Respiratory: Positive for cough and wheezing.   Cardiovascular: Negative for chest pain.       Objective:   Physical Exam  Constitutional: She appears well-developed and well-nourished. No distress.  HENT:  Right Ear: External ear normal.  Left Ear: External ear normal.  Mouth/Throat: No oropharyngeal exudate.  Minimal white mucus posterior pharynx. No exudate  Neck: Neck supple.  Cardiovascular: Normal rate and regular rhythm.   Pulmonary/Chest: Effort normal. She has wheezes. She has no rales.  Musculoskeletal: She exhibits no edema.  Lymphadenopathy:    She has no cervical adenopathy.       Assessment:     Persistent cough with reactive airway component on exam today. No respiratory distress    Plan:     -Obtain chest x-ray given duration of cough -Continue doxycycline for now -Continue albuterol inhaler as needed -Prednisone taper starting at 40 mg daily -Follow-up immediately for any fever or increased shortness of breath -Refill Tussionex to use just at night for severe coughing  [name] MD [facility]

## 2016-08-31 ENCOUNTER — Encounter (HOSPITAL_COMMUNITY)
Admission: RE | Admit: 2016-08-31 | Discharge: 2016-08-31 | Disposition: A | Discharge status: Home / Self Care | Payer: Managed Care, Other (non HMO) | Source: Ambulatory Visit | Attending: Surgery | Admitting: Surgery

## 2016-08-31 ENCOUNTER — Encounter (HOSPITAL_COMMUNITY): Payer: Self-pay

## 2016-08-31 ENCOUNTER — Other Ambulatory Visit (HOSPITAL_COMMUNITY): Payer: Self-pay | Admitting: *Deleted

## 2016-08-31 DIAGNOSIS — G4733 Obstructive sleep apnea (adult) (pediatric): Secondary | ICD-10-CM | POA: Insufficient documentation

## 2016-08-31 DIAGNOSIS — E785 Hyperlipidemia, unspecified: Secondary | ICD-10-CM | POA: Insufficient documentation

## 2016-08-31 DIAGNOSIS — R9431 Abnormal electrocardiogram [ECG] [EKG]: Secondary | ICD-10-CM | POA: Diagnosis not present

## 2016-08-31 DIAGNOSIS — F988 Other specified behavioral and emotional disorders with onset usually occurring in childhood and adolescence: Secondary | ICD-10-CM | POA: Diagnosis not present

## 2016-08-31 DIAGNOSIS — N132 Hydronephrosis with renal and ureteral calculous obstruction: Secondary | ICD-10-CM | POA: Diagnosis not present

## 2016-08-31 DIAGNOSIS — K432 Incisional hernia without obstruction or gangrene: Secondary | ICD-10-CM | POA: Insufficient documentation

## 2016-08-31 DIAGNOSIS — Z01810 Encounter for preprocedural cardiovascular examination: Secondary | ICD-10-CM | POA: Diagnosis present

## 2016-08-31 DIAGNOSIS — I10 Essential (primary) hypertension: Secondary | ICD-10-CM | POA: Insufficient documentation

## 2016-08-31 DIAGNOSIS — N135 Crossing vessel and stricture of ureter without hydronephrosis: Secondary | ICD-10-CM | POA: Diagnosis not present

## 2016-08-31 DIAGNOSIS — Z01812 Encounter for preprocedural laboratory examination: Secondary | ICD-10-CM | POA: Diagnosis not present

## 2016-08-31 DIAGNOSIS — G43909 Migraine, unspecified, not intractable, without status migrainosus: Secondary | ICD-10-CM | POA: Insufficient documentation

## 2016-08-31 HISTORY — DX: Anemia, unspecified: D64.9

## 2016-08-31 HISTORY — DX: Essential (primary) hypertension: I10

## 2016-08-31 HISTORY — DX: Malignant (primary) neoplasm, unspecified: C80.1

## 2016-08-31 HISTORY — DX: Personal history of urinary calculi: Z87.442

## 2016-08-31 HISTORY — DX: Cardiac arrhythmia, unspecified: I49.9

## 2016-08-31 HISTORY — DX: Restless legs syndrome: G25.81

## 2016-08-31 HISTORY — DX: Hypothyroidism, unspecified: E03.9

## 2016-08-31 HISTORY — DX: Unspecified osteoarthritis, unspecified site: M19.90

## 2016-08-31 HISTORY — DX: Irritable bowel syndrome, unspecified: K58.9

## 2016-08-31 LAB — CBC WITH DIFFERENTIAL/PLATELET
Basophils Absolute: 0.1 10*3/uL (ref 0.0–0.1)
Basophils Relative: 0 %
EOS PCT: 0 %
Eosinophils Absolute: 0 10*3/uL (ref 0.0–0.7)
HCT: 38.6 % (ref 36.0–46.0)
Hemoglobin: 12.5 g/dL (ref 12.0–15.0)
LYMPHS ABS: 2.2 10*3/uL (ref 0.7–4.0)
LYMPHS PCT: 16 %
MCH: 26.9 pg (ref 26.0–34.0)
MCHC: 32.4 g/dL (ref 30.0–36.0)
MCV: 83 fL (ref 78.0–100.0)
MONO ABS: 0.9 10*3/uL (ref 0.1–1.0)
Monocytes Relative: 6 %
Neutro Abs: 10.8 10*3/uL — ABNORMAL HIGH (ref 1.7–7.7)
Neutrophils Relative %: 78 %
PLATELETS: 356 10*3/uL (ref 150–400)
RBC: 4.65 MIL/uL (ref 3.87–5.11)
RDW: 13.6 % (ref 11.5–15.5)
WBC: 13.9 10*3/uL — ABNORMAL HIGH (ref 4.0–10.5)

## 2016-08-31 LAB — BASIC METABOLIC PANEL
Anion gap: 8 (ref 5–15)
BUN: 14 mg/dL (ref 6–20)
CO2: 26 mmol/L (ref 22–32)
Calcium: 9.1 mg/dL (ref 8.9–10.3)
Chloride: 105 mmol/L (ref 101–111)
Creatinine, Ser: 0.75 mg/dL (ref 0.44–1.00)
GFR calc Af Amer: >60 mL/min (ref >60)
GFR calc non Af Amer: >60 mL/min (ref >60)
GLUCOSE: 125 mg/dL — AB (ref 65–99)
POTASSIUM: 3.7 mmol/L (ref 3.5–5.1)
Sodium: 139 mmol/L (ref 135–145)

## 2016-08-31 LAB — PROTIME-INR
INR: 0.97
Prothrombin Time: 12.8 seconds (ref 11.4–15.2)

## 2016-08-31 LAB — APTT: aPTT: 28 seconds (ref 24–36)

## 2016-08-31 MED ORDER — VANCOMYCIN HCL 10 G IV SOLR
1500.0000 mg | Freq: Once | INTRAVENOUS | Status: AC
  Administered 2016-09-05: 1500 mg via INTRAVENOUS

## 2016-08-31 NOTE — Progress Notes (Signed)
Pt denies cardiac history, chest pain or sob. Pt states she is not a diabetic. Pt states she was recently given Keflex and had an allergic reaction. I called Dr. Trevor Mace office to notify him of allergy and requested a change in the antibiotic order. Spoke with Abigail Butts at [facility].

## 2016-08-31 NOTE — Pre-Procedure Instructions (Signed)
[place]  08/31/2016    Your procedure is scheduled on Monday, September 05, 2016 at 7:30 AM.   Report to [facility] Entrance "A" Admitting Office at 5:30 AM.   Call this number if you have problems the morning of surgery: [phone]   Questions prior to day of surgery, please call [phone] between 8 & 4 PM.   Remember:  Do not eat food or drink liquids after midnight Sunday, 09/04/16.  Take these medicines the morning of surgery with A SIP OF WATER: Levothyroxine (Synthroid), Imitrex - if needed, Albuterol inhaler - if needed (bring inhaler day of surgery)  Stop NSAIDS (Ibuprofen, Aleve, etc) as of today. Do not use Aspirin products prior to surgery.   Do not wear jewelry, make-up or nail polish.  Do not wear lotions, powders or perfumes.  Do not shave 48 hours prior to surgery.    Do not bring valuables to the hospital.  [facility] is not responsible for any belongings or valuables.  Contacts, dentures or bridgework may not be worn into surgery.  Leave your suitcase in the car.  After surgery it may be brought to your room.  For patients admitted to the hospital, discharge time will be determined by your treatment team.  Patients discharged the day of surgery will not be allowed to drive home.   Special instructions:  [facility] - Preparing for Surgery  Before surgery, you can play an important role.  Because skin is not sterile, your skin needs to be as free of germs as possible.  You can reduce the number of germs on you skin by washing with CHG (chlorahexidine gluconate) soap before surgery.  CHG is an antiseptic cleaner which kills germs and bonds with the skin to continue killing germs even after washing.  Please DO NOT use if you have an allergy to CHG or antibacterial soaps.  If your skin becomes reddened/irritated stop using the CHG and inform your nurse when you arrive at Short Stay.  Do not shave (including legs and underarms) for at least 48 hours  prior to the first CHG shower.  You may shave your face.  Please follow these instructions carefully:   1.  Shower with CHG Soap the night before surgery and the                    morning of Surgery.  2.  If you choose to wash your hair, wash your hair first as usual with your       normal shampoo.  3.  After you shampoo, rinse your hair and body thoroughly to remove the shampoo.  4.  Use CHG as you would any other liquid soap.  You can apply chg directly       to the skin and wash gently with scrungie or a clean washcloth.  5.  Apply the CHG Soap to your body ONLY FROM THE NECK DOWN.        Do not use on open wounds or open sores.  Avoid contact with your eyes, ears, mouth and genitals (private parts).  Wash genitals (private parts) with your normal soap.  6.  Wash thoroughly, paying special attention to the area where your surgery        will be performed.  7.  Thoroughly rinse your body with warm water from the neck down.  8.  DO NOT shower/wash with your normal soap after using and rinsing off  the CHG Soap.  9.  Pat yourself dry with a clean towel.            10.  Wear clean pajamas.            11.  Place clean sheets on your bed the night of your first shower and do not        sleep with pets.  Day of Surgery  Do not apply any lotions the morning of surgery.  Please wear clean clothes to the hospital.   Please read over the fact sheets that you were given.

## 2016-09-04 ENCOUNTER — Encounter (HOSPITAL_COMMUNITY): Payer: Self-pay | Admitting: Anesthesiology

## 2016-09-04 NOTE — Anesthesia Preprocedure Evaluation (Addendum)
Anesthesia Evaluation  Patient identified by MRN, date of birth, ID band Patient awake    Reviewed: Allergy & Precautions, NPO status , Patient's Chart, lab work & pertinent test results  History of Anesthesia Complications (+) PONV  Airway Mallampati: I       Dental no notable dental hx.    Pulmonary    Pulmonary exam normal        Cardiovascular hypertension, Pt. on medications Normal cardiovascular exam     Neuro/Psych negative psych ROS   GI/Hepatic negative GI ROS, Neg liver ROS,   Endo/Other  Morbid obesity  Renal/GU      Musculoskeletal   Abdominal (+) + obese,   Peds  Hematology   Anesthesia Other Findings   Reproductive/Obstetrics negative OB ROS                           Anesthesia Physical Anesthesia Plan  ASA: III  Anesthesia Plan: General   Post-op Pain Management:    Induction: Intravenous  Airway Management Planned: Oral ETT  Additional Equipment:   Intra-op Plan:   Post-operative Plan: Extubation in OR  Informed Consent: I have reviewed the patients History and Physical, chart, labs and discussed the procedure including the risks, benefits and alternatives for the proposed anesthesia with the patient or authorized representative who has indicated his/her understanding and acceptance.     Plan Discussed with: CRNA and Surgeon  Anesthesia Plan Comments:       Anesthesia Quick Evaluation

## 2016-09-05 ENCOUNTER — Encounter (HOSPITAL_COMMUNITY)
Admission: RE | Disposition: A | Discharge status: Home / Self Care | Payer: Self-pay | Source: Ambulatory Visit | Attending: Surgery

## 2016-09-05 ENCOUNTER — Encounter (HOSPITAL_COMMUNITY): Payer: Self-pay | Admitting: Critical Care Medicine

## 2016-09-05 ENCOUNTER — Ambulatory Visit (HOSPITAL_COMMUNITY): Payer: Managed Care, Other (non HMO) | Admitting: Anesthesiology

## 2016-09-05 ENCOUNTER — Ambulatory Visit (HOSPITAL_COMMUNITY)
Admission: RE | Admit: 2016-09-05 | Discharge: 2016-09-05 | Disposition: A | Discharge status: Home / Self Care | Payer: Managed Care, Other (non HMO) | Source: Ambulatory Visit | Attending: Surgery | Admitting: Surgery

## 2016-09-05 DIAGNOSIS — M1990 Unspecified osteoarthritis, unspecified site: Secondary | ICD-10-CM | POA: Insufficient documentation

## 2016-09-05 DIAGNOSIS — K420 Umbilical hernia with obstruction, without gangrene: Secondary | ICD-10-CM | POA: Insufficient documentation

## 2016-09-05 DIAGNOSIS — G4730 Sleep apnea, unspecified: Secondary | ICD-10-CM | POA: Diagnosis not present

## 2016-09-05 DIAGNOSIS — Z6839 Body mass index (BMI) 39.0-39.9, adult: Secondary | ICD-10-CM | POA: Insufficient documentation

## 2016-09-05 DIAGNOSIS — I10 Essential (primary) hypertension: Secondary | ICD-10-CM | POA: Diagnosis not present

## 2016-09-05 DIAGNOSIS — E669 Obesity, unspecified: Secondary | ICD-10-CM | POA: Diagnosis not present

## 2016-09-05 DIAGNOSIS — G43909 Migraine, unspecified, not intractable, without status migrainosus: Secondary | ICD-10-CM | POA: Insufficient documentation

## 2016-09-05 DIAGNOSIS — K436 Other and unspecified ventral hernia with obstruction, without gangrene: Secondary | ICD-10-CM | POA: Diagnosis not present

## 2016-09-05 DIAGNOSIS — E7800 Pure hypercholesterolemia, unspecified: Secondary | ICD-10-CM | POA: Insufficient documentation

## 2016-09-05 DIAGNOSIS — Z79899 Other long term (current) drug therapy: Secondary | ICD-10-CM | POA: Insufficient documentation

## 2016-09-05 HISTORY — PX: INSERTION OF MESH: SHX5868

## 2016-09-05 HISTORY — PX: VENTRAL HERNIA REPAIR: SHX424

## 2016-09-05 SURGERY — REPAIR, HERNIA, VENTRAL, LAPAROSCOPIC
Anesthesia: General | Site: Abdomen

## 2016-09-05 MED ORDER — CHLORHEXIDINE GLUCONATE CLOTH 2 % EX PADS: 6.0000 | MEDICATED_PAD | Freq: Once | CUTANEOUS | Status: DC

## 2016-09-05 MED ORDER — EPHEDRINE 5 MG/ML INJ
INTRAVENOUS | Status: AC
  Filled 2016-09-05: qty 20

## 2016-09-05 MED ORDER — GLYCOPYRROLATE 0.2 MG/ML IV SOSY
PREFILLED_SYRINGE | INTRAVENOUS | Status: DC | PRN
  Administered 2016-09-05: .2 mg via INTRAVENOUS

## 2016-09-05 MED ORDER — ROCURONIUM BROMIDE 50 MG/5ML IV SOSY
PREFILLED_SYRINGE | INTRAVENOUS | Status: DC | PRN
  Administered 2016-09-05: 40 mg via INTRAVENOUS
  Administered 2016-09-05: 10 mg via INTRAVENOUS

## 2016-09-05 MED ORDER — ONDANSETRON HCL 4 MG/2ML IJ SOLN: 4.0000 mg | Freq: Once | INTRAMUSCULAR | Status: DC

## 2016-09-05 MED ORDER — ROCURONIUM BROMIDE 50 MG/5ML IV SOSY
PREFILLED_SYRINGE | INTRAVENOUS | Status: AC
  Filled 2016-09-05: qty 10

## 2016-09-05 MED ORDER — FENTANYL CITRATE (PF) 100 MCG/2ML IJ SOLN
INTRAMUSCULAR | Status: DC | PRN
  Administered 2016-09-05 (×4): 50 ug via INTRAVENOUS
  Administered 2016-09-05: 100 ug via INTRAVENOUS

## 2016-09-05 MED ORDER — BUPIVACAINE HCL (PF) 0.25 % IJ SOLN
INTRAMUSCULAR | Status: DC | PRN
  Administered 2016-09-05: 16 mL

## 2016-09-05 MED ORDER — VANCOMYCIN HCL 10 G IV SOLR
1500.0000 mg | Freq: Once | INTRAVENOUS | Status: DC
  Filled 2016-09-05: qty 1500

## 2016-09-05 MED ORDER — DEXAMETHASONE SODIUM PHOSPHATE 4 MG/ML IJ SOLN: 4.0000 mg | INTRAMUSCULAR | Status: DC

## 2016-09-05 MED ORDER — MIDAZOLAM HCL 5 MG/5ML IJ SOLN
INTRAMUSCULAR | Status: DC | PRN
  Administered 2016-09-05: 2 mg via INTRAVENOUS

## 2016-09-05 MED ORDER — PROPOFOL 10 MG/ML IV BOLUS
INTRAVENOUS | Status: AC
  Filled 2016-09-05: qty 20

## 2016-09-05 MED ORDER — GABAPENTIN 300 MG PO CAPS
300.0000 mg | ORAL_CAPSULE | ORAL | Status: AC
  Administered 2016-09-05: 300 mg via ORAL

## 2016-09-05 MED ORDER — GABAPENTIN 300 MG PO CAPS
ORAL_CAPSULE | ORAL | Status: AC
  Filled 2016-09-05: qty 1

## 2016-09-05 MED ORDER — OXYCODONE-ACETAMINOPHEN 5-325 MG PO TABS: 1.0000 | ORAL_TABLET | Freq: Four times a day (QID) | ORAL | 0 refills | Status: DC | PRN

## 2016-09-05 MED ORDER — CELECOXIB 200 MG PO CAPS
400.0000 mg | ORAL_CAPSULE | ORAL | Status: AC
  Administered 2016-09-05: 400 mg via ORAL

## 2016-09-05 MED ORDER — DOCUSATE SODIUM 100 MG PO CAPS: 100.0000 mg | ORAL_CAPSULE | Freq: Two times a day (BID) | ORAL | 0 refills | Status: AC

## 2016-09-05 MED ORDER — SUCCINYLCHOLINE CHLORIDE 200 MG/10ML IV SOSY
PREFILLED_SYRINGE | INTRAVENOUS | Status: AC
  Filled 2016-09-05: qty 10

## 2016-09-05 MED ORDER — LIDOCAINE 2% (20 MG/ML) 5 ML SYRINGE
INTRAMUSCULAR | Status: AC
  Filled 2016-09-05: qty 10

## 2016-09-05 MED ORDER — FENTANYL CITRATE (PF) 100 MCG/2ML IJ SOLN
INTRAMUSCULAR | Status: AC
  Administered 2016-09-05: 50 ug via INTRAVENOUS
  Filled 2016-09-05: qty 2

## 2016-09-05 MED ORDER — LACTATED RINGERS IV SOLN
INTRAVENOUS | Status: DC | PRN
  Administered 2016-09-05: 07:00:00 via INTRAVENOUS

## 2016-09-05 MED ORDER — KETOROLAC TROMETHAMINE 30 MG/ML IJ SOLN
30.0000 mg | Freq: Once | INTRAMUSCULAR | Status: AC
  Administered 2016-09-05: 30 mg via INTRAVENOUS

## 2016-09-05 MED ORDER — PROPOFOL 10 MG/ML IV BOLUS
INTRAVENOUS | Status: DC | PRN
  Administered 2016-09-05 (×3): 10 mg via INTRAVENOUS
  Administered 2016-09-05: 200 mg via INTRAVENOUS

## 2016-09-05 MED ORDER — MIDAZOLAM HCL 2 MG/2ML IJ SOLN
INTRAMUSCULAR | Status: AC
  Filled 2016-09-05: qty 2

## 2016-09-05 MED ORDER — ACETAMINOPHEN 500 MG PO TABS
ORAL_TABLET | ORAL | Status: AC
  Filled 2016-09-05: qty 1

## 2016-09-05 MED ORDER — EPHEDRINE SULFATE-NACL 50-0.9 MG/10ML-% IV SOSY
PREFILLED_SYRINGE | INTRAVENOUS | Status: DC | PRN
  Administered 2016-09-05: 10 mg via INTRAVENOUS
  Administered 2016-09-05: 15 mg via INTRAVENOUS

## 2016-09-05 MED ORDER — ONDANSETRON HCL 4 MG/2ML IJ SOLN
INTRAMUSCULAR | Status: DC | PRN
  Administered 2016-09-05: 4 mg via INTRAVENOUS

## 2016-09-05 MED ORDER — PHENYLEPHRINE 40 MCG/ML (10ML) SYRINGE FOR IV PUSH (FOR BLOOD PRESSURE SUPPORT)
PREFILLED_SYRINGE | INTRAVENOUS | Status: AC
  Filled 2016-09-05: qty 10

## 2016-09-05 MED ORDER — ONDANSETRON HCL 4 MG/2ML IJ SOLN
INTRAMUSCULAR | Status: AC
  Filled 2016-09-05: qty 2

## 2016-09-05 MED ORDER — KETOROLAC TROMETHAMINE 30 MG/ML IJ SOLN
INTRAMUSCULAR | Status: AC
  Filled 2016-09-05: qty 1

## 2016-09-05 MED ORDER — CELECOXIB 200 MG PO CAPS
ORAL_CAPSULE | ORAL | Status: AC
  Filled 2016-09-05: qty 2

## 2016-09-05 MED ORDER — ACETAMINOPHEN 500 MG PO TABS
1000.0000 mg | ORAL_TABLET | ORAL | Status: AC
  Administered 2016-09-05: 1000 mg via ORAL

## 2016-09-05 MED ORDER — SCOPOLAMINE 1 MG/3DAYS TD PT72
1.0000 | MEDICATED_PATCH | Freq: Once | TRANSDERMAL | Status: DC
  Administered 2016-09-05: 1.5 mg via TRANSDERMAL
  Filled 2016-09-05: qty 1

## 2016-09-05 MED ORDER — MEPERIDINE HCL 25 MG/ML IJ SOLN: 6.2500 mg | INTRAMUSCULAR | Status: DC | PRN

## 2016-09-05 MED ORDER — BUPIVACAINE HCL (PF) 0.25 % IJ SOLN
INTRAMUSCULAR | Status: AC
  Filled 2016-09-05: qty 30

## 2016-09-05 MED ORDER — FENTANYL CITRATE (PF) 100 MCG/2ML IJ SOLN
25.0000 ug | INTRAMUSCULAR | Status: DC | PRN
  Administered 2016-09-05 (×3): 50 ug via INTRAVENOUS

## 2016-09-05 MED ORDER — SUGAMMADEX SODIUM 200 MG/2ML IV SOLN
INTRAVENOUS | Status: DC | PRN
  Administered 2016-09-05: 215 mg via INTRAVENOUS

## 2016-09-05 MED ORDER — FENTANYL CITRATE (PF) 100 MCG/2ML IJ SOLN
INTRAMUSCULAR | Status: AC
  Filled 2016-09-05: qty 2

## 2016-09-05 MED ORDER — ONDANSETRON HCL 4 MG/2ML IJ SOLN: 4.0000 mg | Freq: Once | INTRAMUSCULAR | Status: DC | PRN

## 2016-09-05 MED ORDER — DEXAMETHASONE SODIUM PHOSPHATE 10 MG/ML IJ SOLN
INTRAMUSCULAR | Status: AC
  Filled 2016-09-05: qty 1

## 2016-09-05 MED ORDER — PHENYLEPHRINE HCL 10 MG/ML IJ SOLN
INTRAVENOUS | Status: DC | PRN
  Administered 2016-09-05: 25 ug/min via INTRAVENOUS

## 2016-09-05 MED ORDER — LIDOCAINE 2% (20 MG/ML) 5 ML SYRINGE
INTRAMUSCULAR | Status: DC | PRN
  Administered 2016-09-05: 100 mg via INTRAVENOUS

## 2016-09-05 MED ORDER — 0.9 % SODIUM CHLORIDE (POUR BTL) OPTIME
TOPICAL | Status: DC | PRN
  Administered 2016-09-05: 1000 mL

## 2016-09-05 MED ORDER — FENTANYL CITRATE (PF) 100 MCG/2ML IJ SOLN
INTRAMUSCULAR | Status: AC
  Filled 2016-09-05: qty 4

## 2016-09-05 MED ORDER — SUGAMMADEX SODIUM 200 MG/2ML IV SOLN
INTRAVENOUS | Status: AC
  Filled 2016-09-05: qty 2

## 2016-09-05 MED ORDER — SODIUM CHLORIDE 0.9 % IR SOLN
Status: DC | PRN
  Administered 2016-09-05: 1000 mL

## 2016-09-05 MED ORDER — DEXAMETHASONE SODIUM PHOSPHATE 10 MG/ML IJ SOLN
INTRAMUSCULAR | Status: DC | PRN
  Administered 2016-09-05: 10 mg via INTRAVENOUS

## 2016-09-05 SURGICAL SUPPLY — 42 items
ADH SKN CLS APL DERMABOND .7 (GAUZE/BANDAGES/DRESSINGS) ×1
BINDER ABD UNIV 10 28-50 (GAUZE/BANDAGES/DRESSINGS) IMPLANT
BINDER ABD UNIV 12 45-62 (WOUND CARE) ×1 IMPLANT
BINDER ABDOM UNIV 10 (GAUZE/BANDAGES/DRESSINGS) ×2
BINDER ABDOMINAL 46IN 62IN (WOUND CARE) ×2
CANISTER SUCT 3000ML PPV (MISCELLANEOUS) ×1 IMPLANT
CHLORAPREP W/TINT 26ML (MISCELLANEOUS) ×2 IMPLANT
COVER SURGICAL LIGHT HANDLE (MISCELLANEOUS) ×2 IMPLANT
DERMABOND ADVANCED (GAUZE/BANDAGES/DRESSINGS) ×1
DERMABOND ADVANCED .7 DNX12 (GAUZE/BANDAGES/DRESSINGS) ×1 IMPLANT
DEVICE PMI PUNCTURE CLOSURE (MISCELLANEOUS) ×2 IMPLANT
DEVICE SECURE STRAP 25 ABSORB (INSTRUMENTS) ×3 IMPLANT
ELECT REM PT RETURN 9FT ADLT (ELECTROSURGICAL) ×2
ELECTRODE REM PT RTRN 9FT ADLT (ELECTROSURGICAL) ×1 IMPLANT
GLOVE BIO SURGEON STRL SZ 6 (GLOVE) ×2 IMPLANT
GLOVE BIOGEL PI IND STRL 6.5 (GLOVE) ×1 IMPLANT
GLOVE BIOGEL PI INDICATOR 6.5 (GLOVE) ×1
GOWN STRL REUS W/ TWL LRG LVL3 (GOWN DISPOSABLE) ×3 IMPLANT
GOWN STRL REUS W/TWL LRG LVL3 (GOWN DISPOSABLE) ×6
KIT BASIN OR (CUSTOM PROCEDURE TRAY) ×2 IMPLANT
KIT ROOM TURNOVER OR (KITS) ×2 IMPLANT
MARKER SKIN DUAL TIP RULER LAB (MISCELLANEOUS) ×2 IMPLANT
MESH VENTRALIGHT ST 6X8 (Mesh Specialty) ×2 IMPLANT
MESH VENTRLGHT ELLIPSE 8X6XMFL (Mesh Specialty) IMPLANT
NDL SPNL 22GX3.5 QUINCKE BK (NEEDLE) ×1 IMPLANT
NEEDLE SPNL 22GX3.5 QUINCKE BK (NEEDLE) ×2 IMPLANT
NS IRRIG 1000ML POUR BTL (IV SOLUTION) ×2 IMPLANT
PAD ARMBOARD 7.5X6 YLW CONV (MISCELLANEOUS) ×4 IMPLANT
SCISSORS LAP 5X35 DISP (ENDOMECHANICALS) ×2 IMPLANT
SET IRRIG TUBING LAPAROSCOPIC (IRRIGATION / IRRIGATOR) ×1 IMPLANT
SHEARS HARMONIC ACE PLUS 36CM (ENDOMECHANICALS) IMPLANT
SLEEVE ENDOPATH XCEL 5M (ENDOMECHANICALS) ×4 IMPLANT
SUT ETHIBOND CT1 BRD #0 30IN (SUTURE) ×3 IMPLANT
SUT MNCRL AB 4-0 PS2 18 (SUTURE) ×2 IMPLANT
SUT NOVA NAB GS-21 0 18 T12 DT (SUTURE) ×1 IMPLANT
SUT PDS AB 2-0 CT1 27 (SUTURE) IMPLANT
TOWEL OR 17X24 6PK STRL BLUE (TOWEL DISPOSABLE) ×2 IMPLANT
TRAY LAPAROSCOPIC MC (CUSTOM PROCEDURE TRAY) ×2 IMPLANT
TROCAR XCEL BLUNT TIP 100MML (ENDOMECHANICALS) IMPLANT
TROCAR XCEL NON-BLD 11X100MML (ENDOMECHANICALS) ×2 IMPLANT
TROCAR XCEL NON-BLD 5MMX100MML (ENDOMECHANICALS) ×2 IMPLANT
TUBING INSUFFLATION (TUBING) ×2 IMPLANT

## 2016-09-05 NOTE — Discharge Instructions (Signed)
[facility] REPAIR: POST OP INSTRUCTIONS  ######################################################################  EAT Gradually transition to a high fiber diet with a fiber supplement over the next few weeks after discharge.  Start with a pureed / full liquid diet (see below)  WALK Walk an hour a day.  Control your pain to do that.    CONTROL PAIN Control pain so that you can walk, sleep, tolerate sneezing/coughing, go up/down stairs.  HAVE A BOWEL MOVEMENT DAILY Keep your bowels regular to avoid problems.  OK to try a laxative to override constipation.  OK to use an antidairrheal to slow down diarrhea.  Call if not better after 2 tries  CALL IF YOU HAVE PROBLEMS/CONCERNS Call if you are still struggling despite following these instructions. Call if you have concerns not answered by these instructions  ######################################################################    1. DIET: Follow a light bland diet the first 24 hours after arrival home, such as soup, liquids, crackers, etc.  Be sure to include lots of fluids daily.  Avoid fast food or heavy meals as your are more likely to get nauseated.  Eat a low fat the next few days after surgery. 2. Take your usually prescribed home medications unless otherwise directed. 3. PAIN CONTROL: a. Pain is best controlled by a usual combination of three different methods TOGETHER: i. Ice/Heat ii. Over the counter pain medication iii. Prescription pain medication b. Most patients will experience some swelling and bruising around the hernia(s).  Ice packs or heating pads (30-60 minutes up to 6 times a day) will help. Use ice for the first few days to help decrease swelling and bruising, then switch to heat to help relax tight/sore spots and speed recovery.  Some people prefer to use ice alone, heat alone, alternating between ice & heat.  Experiment to what works for you.  Swelling and bruising can take several weeks to resolve.  Wear the abdominal  binder when out and about to support the abdominal wall and provide some compression. c. It is helpful to take an over-the-counter pain medication regularly for the first few weeks.  Choose one of the following that works best for you: i. Naproxen (Aleve, etc)  Two 220mg  tabs twice a day ii. Ibuprofen (Advil, etc) Three 200mg  tabs four times a day (every meal & bedtime) iii. Acetaminophen (Tylenol, etc) 325-650mg  four times a day (every meal & bedtime) d. A  prescription for pain medication should be given to you upon discharge.  Take your pain medication as prescribed.  i. If you are having problems/concerns with the prescription medicine (does not control pain, nausea, vomiting, rash, itching, etc), please call us [phone] to see if we need to switch you to a different pain medicine that will work better for you and/or control your side effect better. ii. If you need a refill on your pain medication, please contact your pharmacy.  They will contact our office to request authorization. Prescriptions will not be filled after 5 pm or on week-ends. 4. Avoid getting constipated.  Between the surgery and the pain medications, it is common to experience some constipation.  Increasing fluid intake and taking a fiber supplement (such as Metamucil, Citrucel, FiberCon, MiraLax, etc) 1-2 times a day regularly will usually help prevent this problem from occurring.  A mild laxative (prune juice, Milk of Magnesia, MiraLax, etc) should be taken according to package directions if there are no bowel movements after 48 hours.   5. Wash / shower every day starting 24h after surgery.  You may  shower over the skin glue as it is waterproof.   6. The skin glue will flake off after 2-3 weeks. You may leave the incisions open to air.  You may replace a dressing/Band-Aid to cover the incision for comfort if you wish.  Continue to shower over incision(s) after the dressing is off.    7. ACTIVITIES as tolerated:   a. You  may resume regular (light) daily activities beginning the next day--such as daily self-care, walking, climbing stairs--gradually increasing activities as tolerated.  If you can walk 30 minutes without difficulty, it is safe to try more intense activity such as jogging, treadmill, bicycling, low-impact aerobics, swimming, etc. b. Save the most intensive and strenuous activity for last such as sit-ups, heavy lifting, contact sports, etc  Refrain from any heavy lifting or straining until you are off narcotics for pain control.   c. DO NOT PUSH THROUGH PAIN.  Let pain be your guide: If it hurts to do something, don't do it.  Pain is your body warning you to avoid that activity for another week until the pain goes down. d. You may drive when you are no longer taking prescription pain medication, you can comfortably wear a seatbelt, and you can safely maneuver your car and apply brakes. e. Dennis Bast may have sexual intercourse when it is comfortable.  8. FOLLOW UP in our office a. Please call [facility] at [phone] to set up an appointment to see your surgeon in the office for a follow-up appointment approximately 2-3 weeks after your surgery. b. Make sure that you call for this appointment the day you arrive home to insure a convenient appointment time. 9.  IF YOU HAVE DISABILITY OR FAMILY LEAVE FORMS, BRING THEM TO THE OFFICE FOR PROCESSING.  DO NOT GIVE THEM TO YOUR DOCTOR.  WHEN TO CALL us [phone]: 1. Poor pain control 2. Reactions / problems with new medications (rash/itching, nausea, etc)  3. Fever over 101.5 F (38.5 C) 4. Inability to urinate 5. Nausea and/or vomiting 6. Worsening swelling or bruising 7. Continued bleeding from incision. 8. Increased pain, redness, or drainage from the incision   The clinic staff is available to answer your questions during regular business hours (8:30am-5pm).  Please dont hesitate to call and ask to speak to one of our nurses for clinical concerns.   If you  have a medical emergency, go to the nearest emergency room or call 911.  A surgeon from [facility] is always on call at the hospitals in [facility], [place], [place], [place], [place]  [address] ?  [address] [address], [place], [place]   [address] MAIN: [phone] ? TOLL FREE: [phone] ? FAX: [phone] [email or web address]

## 2016-09-05 NOTE — Anesthesia Postprocedure Evaluation (Addendum)
Anesthesia Post Note  Patient: [name] Sherman  Procedure(s) Performed: Procedure(s) (LRB): LAPAROSCOPIC VENTRAL HERNIA (N/A) INSERTION OF MESH (N/A)  Patient location during evaluation: PACU Anesthesia Type: General Level of consciousness: awake Pain management: pain level controlled Vital Signs Assessment: post-procedure vital signs reviewed and stable Respiratory status: spontaneous breathing Cardiovascular status: stable Postop Assessment: no signs of nausea or vomiting Anesthetic complications: no        Last Vitals:  Vitals:   09/05/16 1000 09/05/16 1011  BP:  124/67  Pulse:  94  Resp:  16  Temp: 36.5 C     Last Pain:  Vitals:   09/05/16 1015  TempSrc:   PainSc: 4    Pain Goal: Patients Stated Pain Goal: 2 (09/05/16 0704)               Lakenzie Mcclafferty JR,JOHN Mateo Flow

## 2016-09-05 NOTE — Transfer of Care (Signed)
Immediate Anesthesia Transfer of Care Note  Patient: [name] Sherman  Procedure(s) Performed: Procedure(s): LAPAROSCOPIC VENTRAL HERNIA (N/A) INSERTION OF MESH (N/A)  Patient Location: PACU  Anesthesia Type:General  Level of Consciousness: awake, alert  and oriented  Airway & Oxygen Therapy: Patient Spontanous Breathing and Patient connected to nasal cannula oxygen  Post-op Assessment: Report given to RN, Post -op Vital signs reviewed and stable and Patient moving all extremities X 4  Post vital signs: Reviewed and stable  Last Vitals:  Vitals:   09/05/16 0631  BP: (!) 144/77  Pulse: 87  Resp: 20  Temp: 36.6 C    Last Pain:  Vitals:   09/05/16 0631  TempSrc: Oral      Patients Stated Pain Goal: 2 (23/95/32 0233)  Complications: No apparent anesthesia complications

## 2016-09-05 NOTE — H&P (Signed)
[place] Patient #: [id number] DOB: Feb 22, 1961 Married / Language: English / Race: White Female  History of Present Illness  Patient words: This is a very nice 56 year old woman who presents with a long-standing supraumbilical hernia. It has not increased in size, but it has started to cause her intermittent pain. She also notes intermittent nausea and vomiting. She had previously had issues with constipation but reports that these have resolved at this point. The hernia is never reducible but increases in prominence when she flexes her abdominal wall. She is interested in having it repaired.  She has a history of sleep apnea and kidney stones as well as hypertension and hyperlipidemia. She is status post tubal ligation and laparoscopic assisted hysterectomy, as well as multiple cystoscopies with stent placement and exchanges. It looks like she had a colonoscopy in January 2013 but are not able to review the results in her system. She thinks she had a couple of benign polyps and was told to return in 7-10 years.  She is here today with her husband. She has never smoked. 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  The patient is a 56 year old female.   Past Surgical History  Foot Surgery Right. Hysterectomy (not due to cancer) - Partial Oral Surgery  Diagnostic Studies History Colonoscopy 5-10 years ago Mammogram 1-3 years ago Pap Smear >5 years ago  Allergies  No Known Drug Allergies 08/04/2016 Allergies Reconciled  Medication History  Adderall (20MG  Tablet, Oral daily) Active. Lipitor (10MG  Tablet, Oral daily) Active. Chlorpheniramine-Hydrocodone (2-2.5MG /5ML Liquid, Oral as needed) Active. CeleXA (20MG  Tablet, Oral daily) Active. Ibuprofen (800MG  Tablet, Oral as needed) Active. Synthroid (50MCG Tablet, Oral daily) Active. Hyzaar (50-12.5MG   Tablet, Oral daily) Active. Imitrex (100MG  Tablet, Oral daily) Active. Vitamin D (50000U Capsule, Oral daily) Active. Medications Reconciled  Social History  Alcohol use Occasional alcohol use. Caffeine use Carbonated beverages, Coffee, Tea. No drug use Tobacco use Never smoker.  Family History  Alcohol Abuse Mother. Arthritis Father, Mother, Sister. Colon Cancer Family Members In General. Colon Polyps Family Members In General, Father. Depression Mother. Heart disease in female family member before age 43 Hypertension Father, Sister. Melanoma Father, Sister. Migraine Headache Father, Sister. Respiratory Condition Father. Thyroid problems Sister.  Pregnancy / Birth History Age at menarche 64 years. Gravida 3 Irregular periods Maternal age 46-20 Para 2  Other Problems Arthritis Back Pain Cervical Cancer Hemorrhoids High blood pressure Hypercholesterolemia Kidney Stone Migraine Headache Oophorectomy Left. Sleep Apnea Ventral Hernia Repair     Review of Systems General Present- Fatigue and Weight Gain. Not Present- Appetite Loss, Chills, Fever, Night Sweats and Weight Loss. Skin Not Present- Change in Wart/Mole, Dryness, Hives, Jaundice, New Lesions, Non-Healing Wounds, Rash and Ulcer. HEENT Not Present- Earache, Hearing Loss, Hoarseness, Nose Bleed, Oral Ulcers, Ringing in the Ears, Seasonal Allergies, Sinus Pain, Sore Throat, Visual Disturbances, Wears glasses/contact lenses and Yellow Eyes. Breast Not Present- Breast Mass, Breast Pain, Nipple Discharge and Skin Changes. Cardiovascular Not Present- Chest Pain, Difficulty Breathing Lying Down, Leg Cramps, Palpitations, Rapid Heart Rate, Shortness of Breath and Swelling of Extremities. Gastrointestinal Present- Abdominal Pain, Hemorrhoids, Indigestion and Nausea. Not Present- Bloating, Bloody Stool, Change in Bowel Habits, Chronic diarrhea, Constipation, Difficulty  Swallowing, Excessive gas, Gets full quickly at meals, Rectal Pain and Vomiting. Female Genitourinary Not Present- Frequency, Nocturia, Painful Urination, Pelvic Pain and Urgency. Neurological Not Present- Decreased Memory,  Fainting, Headaches, Numbness, Seizures, Tingling, Tremor, Trouble walking and Weakness. Psychiatric Not Present- Anxiety, Bipolar, Change in Sleep Pattern, Depression, Fearful and Frequent crying. Endocrine Not Present- Cold Intolerance, Excessive Hunger, Hair Changes, Heat Intolerance, Hot flashes and New Diabetes. Hematology Not Present- Blood Thinners, Easy Bruising, Excessive bleeding, Gland problems, HIV and Persistent Infections.  Vitals:   09/05/16 0631  BP: (!) 144/77  Pulse: 87  Resp: 20  Temp: 97.8 F (36.6 C)      Physical Exam   General Note: Alert and cooperative  Integumentary Note: No lesions or rashes on limited skin exam  Head and Neck Note: no mass or thyromegaly  Eye Note: anicteric, extraocular motion intact  ENMT Note: moist mucus membranes, good dentition  Chest and Lung Exam Note: unlabored respirations, symmetrical air entry  Cardiovascular Note: regular rate and rhythm, no pedal edema  Abdomen Note: obese, nontender, nondistended. supraumbilical hernia with incarcerated fat; fascial edge not palpable. small umbilical hernia, reduced  Neurologic Note: grossly intact, normal gait  Neuropsychiatric Note: normal mood and affect, appropriate insight  Musculoskeletal Note: strength symmetrical throughout, no deformity    Assessment & Plan   VENTRAL HERNIA WITHOUT OBSTRUCTION OR GANGRENE (K43.9) Story: incarcerated, increasingly symptomatic. we discussed laparoscopic repair. I informed her of the nature of the surgery, the use of mesh, the risks of surgery including bleeding, infection, pain, scarring, intra-abdominal injury, ileus, and hernia recurrence. We discussed that it would be highly  beneficial if she were able to lose some weight prior to surgery with ongoing efforts afterwards in order to decrease her centripetal obesity and reduce her risk of hernia recurrence. We discussed that it will likely be an outpatient surgery, but if she does have a lot of intra-abdominal adhesive disease from her prior gynecological surgery that she may need to spend the night in the hospital for pain control and monitoring. She has been had several appropriate and insightful questions. They asked for the name of the surgery and the CPT code which I provided so that they could inquire with her insurance company about her costs. We'll plan to schedule in the coming weeks.

## 2016-09-05 NOTE — Op Note (Signed)
Operative Note  [name] Sherman  [id number]  [id number]  09/05/2016   Surgeon: [name]  Assistant: OR staff  Procedure performed: laparoscopic repair of incarcerated ventral hernias with mesh  Preop diagnosis: chronically incarcerated ventral hernia Post-op diagnosis/intraop findings: chronically incarcerated epigastric and umbilical hernias  Specimens: none Retained items: none EBL: 36OQ Complications: none  Description of procedure: After obtaining informed consent the patient was taken to the operating room and placed supine on operating room table wheregeneral endotracheal anesthesia was initiated, preoperative antibiotics were administered, SCDs applied, and a formal timeout was performed. The abdomen was prepped and draped in the usual sterile fashion. Peritoneal access was gained with a visiport technique in the left upper quadrant and insufflation to 81mmHg proceeded without issue. Immediately visible was the epigastric hernia with incarcerated fat and omentum as well as a small umbilical hernia. A 11-mm and 5-mm trocar were placed under direct visualization in the left hemiabdomen. The epigastric hernia contents were reduced and adhesions to the sac divided with cautery. The sac was partially reduced and excised. The falciform ligament was tethered up into this hernia so this was reduced and partially separated from the abdominal wall superiorly using cautery to afford a landing zone for the mesh. The umbilical hernia was also reduced and incarcerated fat and some of the sac excised with cautery with care to avoid injury to the overlying skin. The omentum which had been reduced from the epigastric hernia and cauterized was closely inspected and noted to be well away from the colon which was inspected as well to confirm no injury. Once all had been reduced the hernias were inspected. The umbilical hernia was about 1cm in diameter whereas the epigastric hernia was about 3cm  in diameter, and the distance from the inferior edge of the umbilical hernia to the superior edge of the epigastric hernia was about 10cm. A 6"x8" piece of ventralight mesh was selected to adequately cover both defects. The 3cm hernia defect was reapproximated first using interrupted 0- ethibonds with the laparoscopic suture passer under direct visualization. Tacking sutures of novafil were placed on the four sides of the mesh, which was marked to ensure the rough side would face the abdominal wall. The mesh was then moistened and introduced through the 42mm trocar. Using the suture passer the tacking sutures were brought through the abdominal wall and tied down. The skin and scarpas was loosened from the sutures using a hemostat. The mesh sat nicely over both defects with 5cm at least of overlap and no tension. A secure-strap tacker was then used to circumferentially tack the edges of the mesh to the abdominal wall. Several tacs were placed centrally as well to appose the mesh to the abdominal wall. The mesh was circumferentially inspected confirming no exposed rough component. The 71mm trocar was removed and the fascia closed here with a 0-ethibond using the laparoscopic suture passer under direct visualization. The abdomen was surveyed and hemostasis confirmed. The abdomen was then desufflated and the remaining trocars removed. The skin incisions were closed with subcuticular monocryl and dermabond. An abdominal binder was applied. The patient was then awakened, extubated and taken to PACU in stable condition.   All counts were correct at the completion of the case.

## 2016-09-05 NOTE — Anesthesia Procedure Notes (Addendum)
Procedure Name: Intubation Date/Time: 09/05/2016 7:30 AM Performed by: Merrilyn Puma B Pre-anesthesia Checklist: Patient identified, Emergency Drugs available, Suction available, Patient being monitored and Timeout performed Patient Re-evaluated:Patient Re-evaluated prior to inductionOxygen Delivery Method: Circle system utilized Preoxygenation: Pre-oxygenation with 100% oxygen Intubation Type: IV induction Ventilation: Mask ventilation without difficulty Laryngoscope Size: Mac and 3 Grade View: Grade III Tube type: Oral Tube size: 7.0 mm Number of attempts: 1 Airway Equipment and Method: Stylet Placement Confirmation: ETT inserted through vocal cords under direct vision,  positive ETCO2,  CO2 detector and breath sounds checked- equal and bilateral Secured at: 21 cm Tube secured with: Tape Dental Injury: Teeth and Oropharynx as per pre-operative assessment

## 2016-09-06 ENCOUNTER — Encounter (HOSPITAL_COMMUNITY): Payer: Self-pay | Admitting: Surgery

## 2016-10-06 ENCOUNTER — Other Ambulatory Visit: Payer: Managed Care, Other (non HMO)

## 2016-10-11 ENCOUNTER — Other Ambulatory Visit: Payer: Managed Care, Other (non HMO)

## 2016-10-16 ENCOUNTER — Other Ambulatory Visit: Payer: Self-pay | Admitting: Family Medicine

## 2016-11-10 ENCOUNTER — Telehealth: Payer: Self-pay | Admitting: Family Medicine

## 2016-11-10 NOTE — Telephone Encounter (Signed)
Pt needs new rx generic adderall 20 mg °

## 2016-11-11 MED ORDER — AMPHETAMINE-DEXTROAMPHETAMINE 20 MG PO TABS: 20.0000 mg | ORAL_TABLET | Freq: Two times a day (BID) | ORAL | 0 refills | Status: DC

## 2016-11-11 NOTE — Telephone Encounter (Signed)
Last rx given on 2/20 for #60

## 2016-11-11 NOTE — Telephone Encounter (Signed)
Refill OK

## 2016-11-11 NOTE — Telephone Encounter (Signed)
I called the pt and informed her the Rx was left at the front desk for her to pick up.

## 2016-11-14 ENCOUNTER — Other Ambulatory Visit: Payer: Self-pay | Admitting: Family Medicine

## 2016-11-21 ENCOUNTER — Other Ambulatory Visit: Payer: Self-pay | Admitting: Family Medicine

## 2016-12-02 NOTE — Addendum Note (Signed)
Addendum  created 12/02/16 1050 by Farrell Pantaleo, MD   Sign clinical note    

## 2016-12-19 ENCOUNTER — Ambulatory Visit (INDEPENDENT_AMBULATORY_CARE_PROVIDER_SITE_OTHER): Payer: Managed Care, Other (non HMO) | Admitting: Family Medicine

## 2016-12-19 VITALS — BP 130/90 | HR 71 | Temp 98.4°F | Wt 241.1 lb

## 2016-12-19 DIAGNOSIS — E785 Hyperlipidemia, unspecified: Secondary | ICD-10-CM | POA: Diagnosis not present

## 2016-12-19 DIAGNOSIS — R600 Localized edema: Secondary | ICD-10-CM | POA: Diagnosis not present

## 2016-12-19 DIAGNOSIS — I10 Essential (primary) hypertension: Secondary | ICD-10-CM

## 2016-12-19 DIAGNOSIS — G4733 Obstructive sleep apnea (adult) (pediatric): Secondary | ICD-10-CM | POA: Diagnosis not present

## 2016-12-19 DIAGNOSIS — E039 Hypothyroidism, unspecified: Secondary | ICD-10-CM | POA: Insufficient documentation

## 2016-12-19 LAB — BASIC METABOLIC PANEL
BUN: 9 mg/dL (ref 6–23)
CALCIUM: 9.2 mg/dL (ref 8.4–10.5)
CO2: 29 mEq/L (ref 19–32)
CREATININE: 0.78 mg/dL (ref 0.40–1.20)
Chloride: 100 mEq/L (ref 96–112)
GFR: 81.74 mL/min (ref >60.00)
Glucose, Bld: 101 mg/dL — ABNORMAL HIGH (ref 70–99)
Potassium: 3.7 mEq/L (ref 3.5–5.1)
Sodium: 136 mEq/L (ref 135–145)

## 2016-12-19 LAB — HEPATIC FUNCTION PANEL
ALT: 20 U/L (ref 0–35)
AST: 17 U/L (ref 0–37)
Albumin: 3.9 g/dL (ref 3.5–5.2)
Alkaline Phosphatase: 107 U/L (ref 39–117)
BILIRUBIN DIRECT: 0.1 mg/dL (ref 0.0–0.3)
BILIRUBIN TOTAL: 0.4 mg/dL (ref 0.2–1.2)
Total Protein: 6.7 g/dL (ref 6.0–8.3)

## 2016-12-19 LAB — LIPID PANEL
CHOLESTEROL: 174 mg/dL (ref 0–200)
HDL: 51.3 mg/dL (ref >39.00)
LDL Cholesterol: 98 mg/dL (ref 0–99)
NONHDL: 122.43
Total CHOL/HDL Ratio: 3
Triglycerides: 122 mg/dL (ref 0.0–149.0)
VLDL: 24.4 mg/dL (ref 0.0–40.0)

## 2016-12-19 LAB — TSH: TSH: 2.95 u[IU]/mL (ref 0.35–4.50)

## 2016-12-19 MED ORDER — LOSARTAN POTASSIUM-HCTZ 50-12.5 MG PO TABS: 1.0000 | ORAL_TABLET | Freq: Every day | ORAL | 3 refills | Status: DC

## 2016-12-19 MED ORDER — ATORVASTATIN CALCIUM 10 MG PO TABS: ORAL_TABLET | ORAL | 3 refills | Status: DC

## 2016-12-19 MED ORDER — AMPHETAMINE-DEXTROAMPHETAMINE 20 MG PO TABS: 20.0000 mg | ORAL_TABLET | Freq: Two times a day (BID) | ORAL | 0 refills | Status: DC

## 2016-12-19 MED ORDER — LEVOTHYROXINE SODIUM 50 MCG PO TABS: 50.0000 ug | ORAL_TABLET | Freq: Every day | ORAL | 3 refills | Status: DC

## 2016-12-19 MED ORDER — FUROSEMIDE 20 MG PO TABS: 20.0000 mg | ORAL_TABLET | Freq: Every day | ORAL | 3 refills | Status: DC

## 2016-12-19 NOTE — Progress Notes (Signed)
Subjective:     Patient ID: [name] Sherman, female   DOB: 1960-09-18, 56 y.o.   MRN: [id number]  HPI Patient seen for medical follow-up. She is seen with chief complaint today is some increased swelling in her feet ankles and lower legs for the past few weeks. She does have obstructive sleep apnea and uses CPAP nightly. She has hypertension treated with Hyzaar. Her edema is worse late in the day. No orthopnea. No exertional dyspnea.  Hypothyroidism recently diagnosed. Currently on levothyroxine 50 g once daily. No recent dietary changes. She had recent ventral hernia repair and has done well since then. She's gained about 7 pounds since then. Changed jobs recently. She is working actually 2 jobs and about 70 hours per week and job is very sedentary and she sits a lot.  Needs refills of several medications today. She has ADD and needs refills of Adderall. She's had some bifrontal headaches past few days. She tried leaving off Adderall headache did not improve. Denies any sinusitis symptoms. No fever. No tick bites.  Past Medical History:  Diagnosis Date  . ADD (attention deficit disorder)   . Anemia   . Arthritis    shoulder, right (arthritis, tendonitis, bursitis)  . Cancer ([place])    cervical cancer (conization)   . History of cervical cancer    s/p  laser ablation of cervix 1980's  . History of kidney stones   . Hyperlipidemia   . Hypertension   . Hypothyroidism   . IBS (irritable bowel syndrome)   . Irregular heart rate    as a child, has never had any problems   . Left ureteral stone   . Migraine   . OSA (obstructive sleep apnea)    pt used cpap up until 2013 states lost wt and did not need anymore  . Restless legs   . Umbilical hernia without obstruction or gangrene   . Urgency of urination   . Vitamin D deficiency    Past Surgical History:  Procedure Laterality Date  . ABDOMINAL HYSTERECTOMY    . BUNIONECTOMY Right 2004  . COLONOSCOPY    . CYSTOSCOPY W/ RETROGRADES  Bilateral 05/26/2015   Procedure: CYSTOSCOPY WITH RETROGRADE PYELOGRAM;  Surgeon: Festus Aloe, MD;  Location: [facility];  Service: Urology;  Laterality: Bilateral;  . CYSTOSCOPY W/ URETERAL STENT REMOVAL Left 05/26/2015   Procedure: CYSTOSCOPY WITH STENT REMOVAL;  Surgeon: Festus Aloe, MD;  Location: [facility];  Service: Urology;  Laterality: Left;  . CYSTOSCOPY WITH RETROGRADE PYELOGRAM, URETEROSCOPY AND STENT PLACEMENT Left 05/01/2015   Procedure: CYSTOSCOPY WITH RETROGRADE PYELOGRAM AND STENT PLACEMENT;  Surgeon: Festus Aloe, MD;  Location: [facility];  Service: Urology;  Laterality: Left;  . CYSTOSCOPY WITH STENT PLACEMENT Bilateral 05/26/2015   Procedure: CYSTOSCOPY WITH STENT PLACEMENT;  Surgeon: Festus Aloe, MD;  Location: [facility];  Service: Urology;  Laterality: Bilateral;  . CYSTOSCOPY WITH URETEROSCOPY Bilateral 05/26/2015   Procedure: CYSTOSCOPY WITH URETEROSCOPY;  Surgeon: Festus Aloe, MD;  Location: [facility];  Service: Urology;  Laterality: Bilateral;  . CYSTOSCOPY/URETEROSCOPY/HOLMIUM LASER/STENT PLACEMENT Right 06/09/2015   Procedure: CYSTOSCOPY/URETEROSCOPY/STENT PLACEMENT REMOVAL LEFT URETERAL STENT;  Surgeon: Festus Aloe, MD;  Location: [facility];  Service: Urology;  Laterality: Right;  . HOLMIUM LASER APPLICATION Left 02/72/5366   Procedure: HOLMIUM LASER APPLICATION;  Surgeon: Festus Aloe, MD;  Location: [facility];  Service: Urology;  Laterality: Left;  . HOLMIUM LASER APPLICATION Right 44/08/4740   Procedure: HOLMIUM LASER  APPLICATION;  Surgeon: Festus Aloe, MD;  Location: [facility];  Service: Urology;  Laterality: Right;  . INSERTION OF MESH N/A 09/05/2016   Procedure: INSERTION OF MESH;  Surgeon: [name], MD;  Location: [place];  Service: General;  Laterality: N/A;  . LAPAROSCOPIC ASSISTED VAGINAL HYSTERECTOMY  04-23-2002   and Left Salpingoophorectomy/   Anterior Repair/  Tension Free Tape Sling placement  . LASER ABLATION OF THE CERVIX  x2  1980's  . TUBAL LIGATION  1980's  . VENTRAL HERNIA REPAIR N/A 09/05/2016   Procedure: LAPAROSCOPIC VENTRAL HERNIA;  Surgeon: [name], MD;  Location: [place];  Service: General;  Laterality: N/A;    reports that she has never smoked. She has never used smokeless tobacco. She reports that she does not drink alcohol or use drugs. family history includes Alcohol abuse in her mother; Arthritis in her father; Cancer in her maternal grandmother and paternal grandmother; Heart disease in her father; Hyperlipidemia in her father; Hypertension in her father; Kidney disease in her paternal uncle; Lung cancer in her mother. Allergies  Allergen Reactions  . Keflex [Cephalexin] Swelling    Tongue swelling     Review of Systems  Constitutional: Negative for chills, fatigue and fever.  Eyes: Negative for visual disturbance.  Respiratory: Negative for cough, chest tightness, shortness of breath and wheezing.   Cardiovascular: Positive for leg swelling. Negative for chest pain and palpitations.  Gastrointestinal: Negative for abdominal pain.  Neurological: Positive for headaches. Negative for dizziness, seizures, syncope, weakness and light-headedness.       Objective:   Physical Exam  Constitutional: She appears well-developed and well-nourished.  Eyes: Pupils are equal, round, and reactive to light.  Neck: Neck supple. No JVD present. No thyromegaly present.  Cardiovascular: Normal rate and regular rhythm.  Exam reveals no gallop.   Pulmonary/Chest: Effort normal and breath sounds normal. No respiratory distress. She has no wheezes. She has no rales.  Musculoskeletal:  Only trace edema feet ankles bilaterally  Neurological: She is alert.       Assessment:     #1 bilateral ankle and foot edema. Relatively mild. Suspect some venous stasis. She does have obstructive apnea but is on CPAP.  #2  hypertension with marginal control  #3 attention deficit disorder  #4 hypothyroidism with recent initiation of levothyroxine  #5 dyslipidemia with recent initiation of Lipitor    Plan:     -Refill several medications-Lipitor, Adderall, Hyzaar -Check labs with basic metabolic panel, TSH, lipid, hepatic panel -Continue with regular use of CPAP -She is encouraged to lose some weight -Elevate legs frequently and watch sodium intake -Short-term only use of Lasix 20 mg once daily for severe swelling  [name] MD [facility] at [facility]

## 2017-01-18 ENCOUNTER — Telehealth: Payer: Self-pay | Admitting: Family Medicine

## 2017-01-18 MED ORDER — AMPHETAMINE-DEXTROAMPHETAMINE 20 MG PO TABS
20.0000 mg | ORAL_TABLET | Freq: Two times a day (BID) | ORAL | 0 refills | Status: DC
Start: 2017-01-18 — End: 2017-03-21

## 2017-01-18 MED ORDER — AMPHETAMINE-DEXTROAMPHETAMINE 20 MG PO TABS: 20.0000 mg | ORAL_TABLET | Freq: Two times a day (BID) | ORAL | 0 refills | Status: DC

## 2017-01-18 NOTE — Telephone Encounter (Signed)
Last refill and office visit 12/19/16.  Okay to fill x 3 ?

## 2017-01-18 NOTE — Telephone Encounter (Signed)
Pt request refill  amphetamine-dextroamphetamine (ADDERALL) 20 MG tablet  Pt states she only got one last month, usually gets 3

## 2017-01-18 NOTE — Telephone Encounter (Signed)
Patient is aware Rx ready for pick up

## 2017-01-18 NOTE — Telephone Encounter (Signed)
Refill times 3 

## 2017-01-28 ENCOUNTER — Other Ambulatory Visit: Payer: Self-pay | Admitting: Family Medicine

## 2017-03-05 ENCOUNTER — Other Ambulatory Visit: Payer: Self-pay | Admitting: Family Medicine

## 2017-03-07 ENCOUNTER — Encounter: Payer: Self-pay | Admitting: Family Medicine

## 2017-03-07 ENCOUNTER — Other Ambulatory Visit: Payer: Self-pay | Admitting: *Deleted

## 2017-03-07 ENCOUNTER — Ambulatory Visit (INDEPENDENT_AMBULATORY_CARE_PROVIDER_SITE_OTHER): Payer: Managed Care, Other (non HMO) | Admitting: Family Medicine

## 2017-03-07 VITALS — BP 120/80 | HR 89 | Temp 99.1°F | Wt 242.3 lb

## 2017-03-07 DIAGNOSIS — R1013 Epigastric pain: Secondary | ICD-10-CM | POA: Diagnosis not present

## 2017-03-07 MED ORDER — PANTOPRAZOLE SODIUM 40 MG PO TBEC: 40.0000 mg | DELAYED_RELEASE_TABLET | Freq: Every day | ORAL | 3 refills | Status: DC

## 2017-03-07 MED ORDER — VITAMIN D (ERGOCALCIFEROL) 1.25 MG (50000 UNIT) PO CAPS: ORAL_CAPSULE | ORAL | 0 refills | Status: DC

## 2017-03-07 MED ORDER — CITALOPRAM HYDROBROMIDE 20 MG PO TABS: ORAL_TABLET | ORAL | 0 refills | Status: DC

## 2017-03-07 NOTE — Patient Instructions (Signed)
Avoid all NSAIDS Start the Protonix one daily May supplement with Zantac or Pepcid Touch base in one week if no better Follow up immediately for any vomiting, worsening abdominal pain

## 2017-03-07 NOTE — Progress Notes (Signed)
Subjective:     Patient ID: [name] Sherman, female   DOB: [birth year], 56 y.o.   MRN: [id number]  HPI Patient seen with some epigastric cramp-like abdominal pains which are intermittent for the past couple weeks along with what she describes as dark color stools about a week ago. She states that she had a couple of "black "colored stools on a couple of days and this more recently has been dark brown. She took some Pepto-Bismol last weekend but states that the discolored stool preceded that. She's had some nausea without vomiting. No consistent relation to eating. She has noted that occasionally fried food which she eats infrequently caused a burning sensation in her epigastric region. No recent appetite or weight changes.  Patient had incisional hernia surgery last March and has done well since then. No pain around that site. Location of current pain is mostly epigastric. No radiation to the back. Rare alcohol use. No nonsteroidal use. Denies any fevers or chills. She is Pepto-Bismol couple of times that seem to help her pain. Also said some mild relief with Tums. There is some question of remote history of ulcer several years ago (per pt). We cannot find those records. She states she had EGD and colonoscopy through [facility] several years ago.  Past Medical History:  Diagnosis Date  . ADD (attention deficit disorder)   . Anemia   . Arthritis    shoulder, right (arthritis, tendonitis, bursitis)  . Cancer ([place])    cervical cancer (conization)   . History of cervical cancer    s/p  laser ablation of cervix 1980's  . History of kidney stones   . Hyperlipidemia   . Hypertension   . Hypothyroidism   . IBS (irritable bowel syndrome)   . Irregular heart rate    as a child, has never had any problems   . Left ureteral stone   . Migraine   . OSA (obstructive sleep apnea)    pt used cpap up until 2013 states lost wt and did not need anymore  . Restless legs   . Umbilical hernia without obstruction  or gangrene   . Urgency of urination   . Vitamin D deficiency    Past Surgical History:  Procedure Laterality Date  . ABDOMINAL HYSTERECTOMY    . BUNIONECTOMY Right 2004  . COLONOSCOPY    . CYSTOSCOPY W/ RETROGRADES Bilateral 05/26/2015   Procedure: CYSTOSCOPY WITH RETROGRADE PYELOGRAM;  Surgeon: Festus Aloe, MD;  Location: [facility];  Service: Urology;  Laterality: Bilateral;  . CYSTOSCOPY W/ URETERAL STENT REMOVAL Left 05/26/2015   Procedure: CYSTOSCOPY WITH STENT REMOVAL;  Surgeon: Festus Aloe, MD;  Location: [facility];  Service: Urology;  Laterality: Left;  . CYSTOSCOPY WITH RETROGRADE PYELOGRAM, URETEROSCOPY AND STENT PLACEMENT Left 05/01/2015   Procedure: CYSTOSCOPY WITH RETROGRADE PYELOGRAM AND STENT PLACEMENT;  Surgeon: Festus Aloe, MD;  Location[facility];  Service: Urology;  Laterality: Left;  . CYSTOSCOPY WITH STENT PLACEMENT Bilateral 05/26/2015   Procedure: CYSTOSCOPY WITH STENT PLACEMENT;  Surgeon: Festus Aloe, MD;  Location: [facility];  Service: Urology;  Laterality: Bilateral;  . CYSTOSCOPY WITH URETEROSCOPY Bilateral 05/26/2015   Procedure: CYSTOSCOPY WITH URETEROSCOPY;  Surgeon: Festus Aloe, MD;  Location: [facility];  Service: Urology;  Laterality: Bilateral;  . CYSTOSCOPY/URETEROSCOPY/HOLMIUM LASER/STENT PLACEMENT Right 06/09/2015   Procedure: CYSTOSCOPY/URETEROSCOPY/STENT PLACEMENT REMOVAL LEFT URETERAL STENT;  Surgeon: Festus Aloe, MD;  Location: [facility];  Service: Urology;  Laterality: Right;  . HOLMIUM  LASER APPLICATION Left 94/85/4627   Procedure: HOLMIUM LASER APPLICATION;  Surgeon: Festus Aloe, MD;  Location: [facility];  Service: Urology;  Laterality: Left;  . HOLMIUM LASER APPLICATION Right 03/50/0938   Procedure: HOLMIUM LASER APPLICATION;  Surgeon: Festus Aloe, MD;  Location: [facility];  Service: Urology;  Laterality: Right;  . INSERTION OF MESH  N/A 09/05/2016   Procedure: INSERTION OF MESH;  Surgeon: [name], MD;  Location: [place];  Service: General;  Laterality: N/A;  . LAPAROSCOPIC ASSISTED VAGINAL HYSTERECTOMY  04-23-2002   and Left Salpingoophorectomy/  Anterior Repair/  Tension Free Tape Sling placement  . LASER ABLATION OF THE CERVIX  x2  1980's  . TUBAL LIGATION  1980's  . VENTRAL HERNIA REPAIR N/A 09/05/2016   Procedure: LAPAROSCOPIC VENTRAL HERNIA;  Surgeon: [name], MD;  Location: [place];  Service: General;  Laterality: N/A;    reports that she has never smoked. She has never used smokeless tobacco. She reports that she does not drink alcohol or use drugs. family history includes Alcohol abuse in her mother; Arthritis in her father; Cancer in her maternal grandmother and paternal grandmother; Heart disease in her father; Hyperlipidemia in her father; Hypertension in her father; Kidney disease in her paternal uncle; Lung cancer in her mother. Allergies  Allergen Reactions  . Keflex [Cephalexin] Swelling    Tongue swelling     Review of Systems  Constitutional: Negative for appetite change and unexpected weight change.  HENT: Negative for trouble swallowing.   Respiratory: Negative for shortness of breath.   Cardiovascular: Negative for chest pain and leg swelling.  Gastrointestinal: Positive for abdominal pain and nausea. Negative for constipation and vomiting.  Musculoskeletal: Negative for back pain.       Objective:   Physical Exam  Constitutional: She appears well-developed and well-nourished.  Cardiovascular: Normal rate and regular rhythm.   Pulmonary/Chest: Effort normal and breath sounds normal. No respiratory distress. She has no wheezes. She has no rales.  Abdominal: Soft. Bowel sounds are normal. She exhibits no distension and no mass. There is tenderness. There is no rebound and no guarding.  Mild tenderness epigastric region with deep palpation. No guarding or rebound. No right upper  quadrant tenderness.  Genitourinary:  Genitourinary Comments: Digital exam reveals no rectal mass. Minimal brown stool Hemoccult negative  Musculoskeletal: She exhibits no edema.       Assessment:     Patient seen with 2 week history of intermittent crampy epigastric type pain. Doubt symptomatic gallstones as she has no pain in her right upper quadrant. She has had some intermittent nausea without vomiting. Hemoccult negative; no evidence for active bleeding. No acute findings on exam at this time. Question gastritis versus peptic ulcer disease versus other    Plan:     -Avoid all non-steroidals -Check CBC -Start Protonix 40 mg once daily -May supplement with Zantac or Pepcid initially -Touch base in one week if symptoms not improving -Follow-up sooner for any vomiting, worsening abdominal pain, or other concerns  [name] MD [facility] at [facility]

## 2017-03-08 ENCOUNTER — Encounter: Payer: Self-pay | Admitting: *Deleted

## 2017-03-08 LAB — CBC WITH DIFFERENTIAL/PLATELET
BASOS PCT: 0.8 % (ref 0.0–3.0)
Basophils Absolute: 0.1 10*3/uL (ref 0.0–0.1)
EOS PCT: 2.8 % (ref 0.0–5.0)
Eosinophils Absolute: 0.2 10*3/uL (ref 0.0–0.7)
HCT: 39.8 % (ref 36.0–46.0)
HEMOGLOBIN: 13.2 g/dL (ref 12.0–15.0)
LYMPHS ABS: 2.5 10*3/uL (ref 0.7–4.0)
Lymphocytes Relative: 28.2 % (ref 12.0–46.0)
MCHC: 33 g/dL (ref 30.0–36.0)
MCV: 82.3 fl (ref 78.0–100.0)
MONO ABS: 0.6 10*3/uL (ref 0.1–1.0)
MONOS PCT: 6.7 % (ref 3.0–12.0)
NEUTROS PCT: 61.5 % (ref 43.0–77.0)
Neutro Abs: 5.4 10*3/uL (ref 1.4–7.7)
PLATELETS: 346 10*3/uL (ref 150.0–400.0)
RBC: 4.84 Mil/uL (ref 3.87–5.11)
RDW: 14 % (ref 11.5–15.5)
WBC: 8.8 10*3/uL (ref 4.0–10.5)

## 2017-03-13 ENCOUNTER — Telehealth: Payer: Self-pay | Admitting: Family Medicine

## 2017-03-13 NOTE — Telephone Encounter (Signed)
Pt is needing a updated work note stating the pt was Monday, Tuesday, Wednesday and Thursday to be faxed to [phone] Attn:  Janith Lima.

## 2017-03-13 NOTE — Telephone Encounter (Signed)
Letter faxed and confirmed

## 2017-03-13 NOTE — Telephone Encounter (Signed)
Okay to fax note.

## 2017-03-13 NOTE — Telephone Encounter (Signed)
OK 

## 2017-03-16 ENCOUNTER — Telehealth: Payer: Self-pay | Admitting: Family Medicine

## 2017-03-16 NOTE — Telephone Encounter (Signed)
[facility] has pt adderall rx and its not due until 03-22-17 to be refilled. [facility] will refill 2 day early on 03-20-17. Please call pharm to confirm its ok to refill early

## 2017-03-17 NOTE — Telephone Encounter (Signed)
° ° ° °  Pt following up on her request to have her ADDERALL filled on 03/19/17

## 2017-03-20 NOTE — Telephone Encounter (Signed)
OK to refill for 3 months. 

## 2017-03-21 MED ORDER — AMPHETAMINE-DEXTROAMPHETAMINE 20 MG PO TABS: 20.0000 mg | ORAL_TABLET | Freq: Two times a day (BID) | ORAL | 0 refills | Status: DC

## 2017-03-21 NOTE — Telephone Encounter (Signed)
Rx ready for pick up and patient is aware 

## 2017-03-24 ENCOUNTER — Encounter (HOSPITAL_BASED_OUTPATIENT_CLINIC_OR_DEPARTMENT_OTHER): Payer: Self-pay | Admitting: Emergency Medicine

## 2017-03-24 ENCOUNTER — Emergency Department (HOSPITAL_BASED_OUTPATIENT_CLINIC_OR_DEPARTMENT_OTHER): Payer: Managed Care, Other (non HMO)

## 2017-03-24 ENCOUNTER — Emergency Department (HOSPITAL_BASED_OUTPATIENT_CLINIC_OR_DEPARTMENT_OTHER)
Admission: EM | Admit: 2017-03-24 | Discharge: 2017-03-24 | Disposition: A | Discharge status: Home / Self Care | Payer: Managed Care, Other (non HMO) | Attending: Emergency Medicine | Admitting: Emergency Medicine

## 2017-03-24 ENCOUNTER — Ambulatory Visit: Payer: Managed Care, Other (non HMO) | Admitting: Family Medicine

## 2017-03-24 DIAGNOSIS — R1013 Epigastric pain: Secondary | ICD-10-CM | POA: Diagnosis not present

## 2017-03-24 DIAGNOSIS — Z79899 Other long term (current) drug therapy: Secondary | ICD-10-CM | POA: Diagnosis not present

## 2017-03-24 DIAGNOSIS — I10 Essential (primary) hypertension: Secondary | ICD-10-CM | POA: Diagnosis not present

## 2017-03-24 DIAGNOSIS — Z8541 Personal history of malignant neoplasm of cervix uteri: Secondary | ICD-10-CM | POA: Insufficient documentation

## 2017-03-24 DIAGNOSIS — K829 Disease of gallbladder, unspecified: Secondary | ICD-10-CM

## 2017-03-24 DIAGNOSIS — E039 Hypothyroidism, unspecified: Secondary | ICD-10-CM | POA: Insufficient documentation

## 2017-03-24 LAB — COMPREHENSIVE METABOLIC PANEL
ALK PHOS: 118 U/L (ref 38–126)
ALT: 36 U/L (ref 14–54)
AST: 30 U/L (ref 15–41)
Albumin: 3.7 g/dL (ref 3.5–5.0)
Anion gap: 6 (ref 5–15)
BILIRUBIN TOTAL: 0.3 mg/dL (ref 0.3–1.2)
BUN: 12 mg/dL (ref 6–20)
CALCIUM: 8.8 mg/dL — AB (ref 8.9–10.3)
CO2: 27 mmol/L (ref 22–32)
Chloride: 104 mmol/L (ref 101–111)
Creatinine, Ser: 0.83 mg/dL (ref 0.44–1.00)
GLUCOSE: 132 mg/dL — AB (ref 65–99)
Potassium: 3.5 mmol/L (ref 3.5–5.1)
Sodium: 137 mmol/L (ref 135–145)
TOTAL PROTEIN: 7.1 g/dL (ref 6.5–8.1)

## 2017-03-24 LAB — CBC
HEMATOCRIT: 38.2 % (ref 36.0–46.0)
HEMOGLOBIN: 12.4 g/dL (ref 12.0–15.0)
MCH: 26.8 pg (ref 26.0–34.0)
MCHC: 32.5 g/dL (ref 30.0–36.0)
MCV: 82.7 fL (ref 78.0–100.0)
Platelets: 325 10*3/uL (ref 150–400)
RBC: 4.62 MIL/uL (ref 3.87–5.11)
RDW: 13.9 % (ref 11.5–15.5)
WBC: 11.4 10*3/uL — ABNORMAL HIGH (ref 4.0–10.5)

## 2017-03-24 LAB — URINALYSIS, ROUTINE W REFLEX MICROSCOPIC
Bilirubin Urine: NEGATIVE
Glucose, UA: NEGATIVE mg/dL
Hgb urine dipstick: NEGATIVE
Ketones, ur: NEGATIVE mg/dL
Leukocytes, UA: NEGATIVE
NITRITE: NEGATIVE
PH: 6 (ref 5.0–8.0)
Protein, ur: NEGATIVE mg/dL
SPECIFIC GRAVITY, URINE: 1.025 (ref 1.005–1.030)

## 2017-03-24 LAB — LIPASE, BLOOD: Lipase: 47 U/L (ref 11–51)

## 2017-03-24 MED ORDER — SUCRALFATE 1 GM/10ML PO SUSP: 1.0000 g | Freq: Three times a day (TID) | ORAL | 0 refills | Status: DC

## 2017-03-24 MED ORDER — ONDANSETRON 4 MG PO TBDP
4.0000 mg | ORAL_TABLET | Freq: Once | ORAL | Status: AC
  Administered 2017-03-24: 4 mg via ORAL
  Filled 2017-03-24: qty 1

## 2017-03-24 MED ORDER — MORPHINE SULFATE (PF) 4 MG/ML IV SOLN
4.0000 mg | Freq: Once | INTRAVENOUS | Status: AC
  Administered 2017-03-24: 4 mg via INTRAMUSCULAR

## 2017-03-24 MED ORDER — GI COCKTAIL ~~LOC~~
30.0000 mL | Freq: Once | ORAL | Status: AC
  Administered 2017-03-24: 30 mL via ORAL
  Filled 2017-03-24: qty 30

## 2017-03-24 MED ORDER — MORPHINE SULFATE (PF) 4 MG/ML IV SOLN
4.0000 mg | Freq: Once | INTRAVENOUS | Status: DC
  Filled 2017-03-24: qty 1

## 2017-03-24 NOTE — Discharge Instructions (Addendum)
Please continue Protonix as discussed. Along with this I want you to take with meals to help try to relieve the epigastric pain. There is no improvement in your epigastric pain allergy to follow-up with your primary care physician to see about being tested for H. Pylori and GI referral if no improvement.

## 2017-03-24 NOTE — ED Provider Notes (Signed)
[place] DEPT [facility] Provider Note   CSN: [id number] Arrival date & time: 03/24/17  1558     History   Chief Complaint Chief Complaint  Patient presents with  . Abdominal Pain    HPI [name] Sherman is a 56 y.o. female.  HPI   Patient with pmhx IBS, hypertension, kidney stones presenting for epigastric pain. Patient states that she has had epigastric pain for about 3 weeks. States that there is always a pressure in her stomach. However the pain that she has is significantly worse after eating a meal. Patient has this pain intermittently. She feels nauseated but did not vomit. Patient has IBS and generally has diarrhea therefore her stool pattern has not changed Patient denies any bloody stools or melena. She denies any alcohol or NSAID use. She was seen by her PCP for this pain about 2 weeks ago and was started on Protonix. Patient states that that has helped with her reflux symptoms but not really with the epigastric pain. Patient has had a ventral hernia repair done on her abdomen and to kidney stones removed. No other abdominal surgeries. Patient denies fevers or chills. Patient denies any upper back pain.  Past Medical History:  Diagnosis Date  . ADD (attention deficit disorder)   . Anemia   . Arthritis    shoulder, right (arthritis, tendonitis, bursitis)  . Cancer ([place])    cervical cancer (conization)   . History of cervical cancer    s/p  laser ablation of cervix 1980's  . History of kidney stones   . Hyperlipidemia   . Hypertension   . Hypothyroidism   . IBS (irritable bowel syndrome)   . Irregular heart rate    as a child, has never had any problems   . Left ureteral stone   . Migraine   . OSA (obstructive sleep apnea)    pt used cpap up until 2013 states lost wt and did not need anymore  . Restless legs   . Umbilical hernia without obstruction or gangrene   . Urgency of urination   . Vitamin D deficiency     Patient Active Problem List   Diagnosis Date  Noted  . Hypothyroid 12/19/2016  . Essential hypertension 09/21/2015  . Elevated blood pressure 07/15/2015  . Ureteral obstruction 05/26/2015  . UTI (lower urinary tract infection) 05/01/2015  . Ureteral stone with hydronephrosis 05/01/2015  . ADD (attention deficit disorder) 12/26/2012  . Incisional hernia, without obstruction or gangrene 12/26/2012  . Migraine headache 11/14/2012  . Hyperlipidemia 11/14/2012  . OSA (obstructive sleep apnea) 11/14/2012    Past Surgical History:  Procedure Laterality Date  . ABDOMINAL HYSTERECTOMY    . BUNIONECTOMY Right 2004  . COLONOSCOPY    . CYSTOSCOPY W/ RETROGRADES Bilateral 05/26/2015   Procedure: CYSTOSCOPY WITH RETROGRADE PYELOGRAM;  Surgeon: Festus Aloe, MD;  Location: [facility];  Service: Urology;  Laterality: Bilateral;  . CYSTOSCOPY W/ URETERAL STENT REMOVAL Left 05/26/2015   Procedure: CYSTOSCOPY WITH STENT REMOVAL;  Surgeon: Festus Aloe, MD;  Location[facility];  Service: Urology;  Laterality: Left;  . CYSTOSCOPY WITH RETROGRADE PYELOGRAM, URETEROSCOPY AND STENT PLACEMENT Left 05/01/2015   Procedure: CYSTOSCOPY WITH RETROGRADE PYELOGRAM AND STENT PLACEMENT;  Surgeon: Festus Aloe, MD;  Location: [facility];  Service: Urology;  Laterality: Left;  . CYSTOSCOPY WITH STENT PLACEMENT Bilateral 05/26/2015   Procedure: CYSTOSCOPY WITH STENT PLACEMENT;  Surgeon: Festus Aloe, MD;  Location: [facility];  Service: Urology;  Laterality: Bilateral;  .  CYSTOSCOPY WITH URETEROSCOPY Bilateral 05/26/2015   Procedure: CYSTOSCOPY WITH URETEROSCOPY;  Surgeon: Festus Aloe, MD;  Location: [facility];  Service: Urology;  Laterality: Bilateral;  . CYSTOSCOPY/URETEROSCOPY/HOLMIUM LASER/STENT PLACEMENT Right 06/09/2015   Procedure: CYSTOSCOPY/URETEROSCOPY/STENT PLACEMENT REMOVAL LEFT URETERAL STENT;  Surgeon: Festus Aloe, MD;  Location: [facility];  Service: Urology;   Laterality: Right;  . HOLMIUM LASER APPLICATION Left 15/17/6160   Procedure: HOLMIUM LASER APPLICATION;  Surgeon: Festus Aloe, MD;  Location: [facility];  Service: Urology;  Laterality: Left;  . HOLMIUM LASER APPLICATION Right 73/71/0626   Procedure: HOLMIUM LASER APPLICATION;  Surgeon: Festus Aloe, MD;  Location: [facility];  Service: Urology;  Laterality: Right;  . INSERTION OF MESH N/A 09/05/2016   Procedure: INSERTION OF MESH;  Surgeon: [name], MD;  Location: [place];  Service: General;  Laterality: N/A;  . LAPAROSCOPIC ASSISTED VAGINAL HYSTERECTOMY  04-23-2002   and Left Salpingoophorectomy/  Anterior Repair/  Tension Free Tape Sling placement  . LASER ABLATION OF THE CERVIX  x2  1980's  . TUBAL LIGATION  1980's  . VENTRAL HERNIA REPAIR N/A 09/05/2016   Procedure: LAPAROSCOPIC VENTRAL HERNIA;  Surgeon: [name], MD;  Location: [place];  Service: General;  Laterality: N/A;    OB History    No data available       Home Medications    Prior to Admission medications   Medication Sig Start Date End Date Taking? Authorizing Provider  albuterol (PROVENTIL HFA;VENTOLIN HFA) 108 (90 Base) MCG/ACT inhaler Inhale 2 puffs into the lungs every 6 (six) hours as needed for wheezing or shortness of breath. Patient not taking: Reported on 03/07/2017 08/16/16   [name], MD  amphetamine-dextroamphetamine (ADDERALL) 20 MG tablet Take 1 tablet (20 mg total) by mouth 2 (two) times daily. 03/21/17   Burchette, Alinda Sierras, MD  amphetamine-dextroamphetamine (ADDERALL) 20 MG tablet Take 1 tablet (20 mg total) by mouth 2 (two) times daily. Fill in one month 03/21/17   Burchette, Alinda Sierras, MD  amphetamine-dextroamphetamine (ADDERALL) 20 MG tablet Take 1 tablet (20 mg total) by mouth 2 (two) times daily. 03/21/17   Burchette, Alinda Sierras, MD  atorvastatin (LIPITOR) 10 MG tablet TAKE 1 TABLET(10 MG) BY MOUTH DAILY 12/19/16   Burchette, Alinda Sierras, MD  citalopram (CELEXA) 20 MG  tablet TAKE 3 TABLETS BY MOUTH NIGHTLY AT BEDTIME OR AS DIRECTED 03/07/17   Burchette, Alinda Sierras, MD  furosemide (LASIX) 20 MG tablet Take 1 tablet (20 mg total) by mouth daily. 12/19/16   Burchette, Alinda Sierras, MD  ibuprofen (ADVIL,MOTRIN) 800 MG tablet Take 1 tablet (800 mg total) by mouth 3 (three) times daily. Patient taking differently: Take 800 mg by mouth every 8 (eight) hours as needed (for pain.).  05/21/15   Palumbo, April, MD  levothyroxine (SYNTHROID, LEVOTHROID) 50 MCG tablet Take 1 tablet (50 mcg total) by mouth daily. 12/19/16   Burchette, Alinda Sierras, MD  losartan-hydrochlorothiazide (HYZAAR) 50-12.5 MG tablet Take 1 tablet by mouth daily. 12/19/16   Burchette, Alinda Sierras, MD  pantoprazole (PROTONIX) 40 MG tablet Take 1 tablet (40 mg total) by mouth daily. 03/07/17   Burchette, Alinda Sierras, MD  SUMAtriptan (IMITREX) 100 MG tablet Take 1 tablet (100 mg total) by mouth every 2 (two) hours as needed for migraine. May repeat in 2 hours if headache persists or recurs. 06/29/16   Burchette, Alinda Sierras, MD  Vitamin D, Ergocalciferol, (DRISDOL) 50000 units CAPS capsule TAKE 1 CAPSULE BY MOUTH 1 TIME A  WEEK 03/07/17   Burchette, Alinda Sierras, MD    Family History Family History  Problem Relation Age of Onset  . Alcohol abuse Mother   . Lung cancer Mother   . Arthritis Father   . Hyperlipidemia Father   . Heart disease Father   . Hypertension Father   . Kidney disease Paternal Uncle   . Cancer Maternal Grandmother        colon  . Cancer Paternal Grandmother        breast    Social History Social History  Substance Use Topics  . Smoking status: Never Smoker  . Smokeless tobacco: Never Used  . Alcohol use No     Comment: 1 x year     Allergies   Keflex [cephalexin]   Review of Systems Review of Systems  Constitutional: Negative for chills.  Gastrointestinal: Positive for abdominal pain, diarrhea and nausea. Negative for constipation.  Genitourinary: Negative for dysuria, flank pain and frequency.    Musculoskeletal: Negative for back pain.     Physical Exam Updated Vital Signs BP 111/60 (BP Location: Right Arm)   Pulse 69   Temp 97.9 F (36.6 C) (Oral)   Resp 18   Ht 5\' 5"  (1.651 m)   Wt 108.9 kg (240 lb)   SpO2 99%   BMI 39.94 kg/m   Physical Exam  Constitutional: She appears well-developed and well-nourished. No distress.  HENT:  Head: Normocephalic and atraumatic.  Eyes: Conjunctivae are normal.  Neck: Neck supple.  Cardiovascular: Normal rate and regular rhythm.   No murmur heard. Pulmonary/Chest: Effort normal and breath sounds normal. No respiratory distress.  Abdominal: Soft. There is tenderness.  Patient with upper quadrant tenderness both right and left.  Musculoskeletal: She exhibits no edema.  Neurological: She is alert.  Skin: Skin is warm and dry.  Psychiatric: She has a normal mood and affect.  Nursing note and vitals reviewed.    ED Treatments / Results  Labs (all labs ordered are listed, but only abnormal results are displayed) Labs Reviewed  COMPREHENSIVE METABOLIC PANEL - Abnormal; Notable for the following:       Result Value   Glucose, Bld 132 (*)    Calcium 8.8 (*)    All other components within normal limits  CBC - Abnormal; Notable for the following:    WBC 11.4 (*)    All other components within normal limits  URINALYSIS, ROUTINE W REFLEX MICROSCOPIC - Abnormal; Notable for the following:    APPearance CLOUDY (*)    All other components within normal limits  LIPASE, BLOOD    EKG  EKG Interpretation None       Radiology US Abdomen Limited Ruq  Result Date: 03/24/2017 CLINICAL DATA:  RIGHT upper quadrant pain and nausea for 3 weeks. History of kidney stones. EXAM: ULTRASOUND ABDOMEN LIMITED RIGHT UPPER QUADRANT COMPARISON:  CT abdomen and pelvis FINDINGS: Gallbladder: Mildly contracted. No gallstones or wall thickening visualized. No sonographic Murphy sign noted by sonographer. Common bile duct: Diameter:  Not  sonographically identified . Liver: No focal lesion identified. Diffusely increased hepatic echotexture. Portal vein is patent on color Doppler imaging with normal direction of blood flow towards the liver. IMPRESSION: Echogenic liver compatible with steatosis/ hepatocellular disease. No acute RIGHT upper quadrant process. Electronically Signed   By: Elon Alas M.D.   On: 03/24/2017 19:48    Procedures Procedures (including critical care time)  Medications Ordered in ED Medications  ondansetron (ZOFRAN-ODT) disintegrating tablet 4 mg (4 mg Oral  Given 03/24/17 1935)  morphine 4 MG/ML injection 4 mg (4 mg Intramuscular Given 03/24/17 1935)  gi cocktail (Maalox,Lidocaine,Donnatal) (30 mLs Oral Given 03/24/17 2020)     Initial Impression / Assessment and Plan / ED Course  I have reviewed the triage vital signs and the nursing notes.  Pertinent labs & imaging results that were available during my care of the patient were reviewed by me and considered in my medical decision making (see chart for details).  Patient presenting with epigastric abdominal pain, slightly elevated white blood cell count of 11.4 which has been noted on previous CBCs. Patient with negative right upper quadrant ultrasound for cholelithiasis or cholecystitis. Patient with normal liver function tests. It is possible given the association of this pain with food that she has an ulcer. Was replaced on Protonix about 2 weeks ago with limited improvement in her epigastric pain. Will start patient on sulfcrate along with Protonix. Have her follow-up with her PCP for a GI consult if not improved. Will provide patient with Zofran for nausea. Given lack of CVA tenderness or hematuria unlikely nephrolithiasis. Given patient normal bowel movements, no vomiting, bowel sounds present and no distention, unlikely SBO.   Clinical Course as of Mar 24 2025  Fri Mar 24, 2017  1857 WBC: (!) 11.4 [AM]  5573 WBC: (!) 11.4 [AM]    Clinical  Course User Index [AM] Tonette Bihari, MD     Final Clinical Impressions(s) / ED Diagnoses   Final diagnoses:  Gallbladder pain    New Prescriptions New Prescriptions   No medications on file     Tonette Bihari, MD 03/24/17 2037    Rex Kras Wenda Overland, MD 03/26/17 [phone]

## 2017-03-24 NOTE — ED Notes (Signed)
Patient transported to Ultrasound 

## 2017-03-24 NOTE — ED Triage Notes (Signed)
Upper mid abd x 3 weeks. Was seen by PCP and given protonix. Pt reports no relief. Pt had a follow up appt with the dr today but left there to come here for further eval.

## 2017-04-02 ENCOUNTER — Other Ambulatory Visit: Payer: Self-pay | Admitting: Family Medicine

## 2017-04-03 ENCOUNTER — Other Ambulatory Visit: Payer: Self-pay | Admitting: Family Medicine

## 2017-04-04 ENCOUNTER — Encounter: Payer: Self-pay | Admitting: Family Medicine

## 2017-04-04 ENCOUNTER — Ambulatory Visit (INDEPENDENT_AMBULATORY_CARE_PROVIDER_SITE_OTHER): Payer: Managed Care, Other (non HMO) | Admitting: Family Medicine

## 2017-04-04 VITALS — BP 142/84 | HR 90 | Temp 98.3°F | Resp 16 | Wt 242.6 lb

## 2017-04-04 DIAGNOSIS — R1013 Epigastric pain: Secondary | ICD-10-CM

## 2017-04-04 DIAGNOSIS — Z23 Encounter for immunization: Secondary | ICD-10-CM

## 2017-04-04 DIAGNOSIS — R350 Frequency of micturition: Secondary | ICD-10-CM

## 2017-04-04 LAB — POCT URINALYSIS DIPSTICK
Bilirubin, UA: NEGATIVE
Blood, UA: NEGATIVE
Glucose, UA: NEGATIVE
Ketones, UA: NEGATIVE
LEUKOCYTES UA: NEGATIVE
NITRITE UA: NEGATIVE
PROTEIN UA: NEGATIVE
Spec Grav, UA: 1.025 (ref 1.010–1.025)
UROBILINOGEN UA: 0.2 U/dL
pH, UA: 6 (ref 5.0–8.0)

## 2017-04-04 MED ORDER — SUCRALFATE 1 GM/10ML PO SUSP: 1.0000 g | Freq: Three times a day (TID) | ORAL | 0 refills | Status: DC

## 2017-04-04 NOTE — Progress Notes (Signed)
Subjective:     Patient ID: [name] Sherman, female   DOB: 10-03-1960, 56 y.o.   MRN: [id number]  HPI   Patient here for ER follow-up and to discuss GI issues. She has past medical history of obesity, hypertension, migraine headaches, kidney stones, hyperlipidemia. She was seen here in office on 03/07/17 with some epigastric cramp-like abdominal pains intermittent past couple weeks. She also complained of some "darker" colored stool but had been taking some Pepto-Bismol. No regular nonsteroidal use. Stool was Hemoccult negative . At that point was not having any right upper quadrant pain. She was having some intermittent nausea without vomiting. We considered peptic ulcer disease versus gastritis in differential. CBC was stable. We started Protonix and she noticed minimal improvement.  She went to ER on 03/26/17 with some persistent epigastric pain worse after eating. She recalls being treated for what sound like Helicobacter pylori in the past. She thinks this was > 10 years ago.. She had ultrasound in ER which showed no gallbladder pathology. Probable fatty liver changes. CBC was stable. She had addition of liquid Carafate which has helped her pain a fair amount. She has no pain with swallowing. No dysphagia. Pain remains epigastric area and no radiation. No melena. No hematemesis. No exertional pain. No chest pain. Slightly decreased appetite but weight is stable.  Patient states she is due for repeat colonoscopy. She had EGD over 10 years ago through [place] for similar type symptoms. She is not aware of any history of peptic ulcer disease.  Pt also c/o some urine frequency but not significant burning and is requesting UA.  No flank pain.    Past Medical History:  Diagnosis Date  . ADD (attention deficit disorder)   . Anemia   . Arthritis    shoulder, right (arthritis, tendonitis, bursitis)  . Cancer ([place])    cervical cancer (conization)   . History of cervical cancer    s/p  laser ablation of  cervix 1980's  . History of kidney stones   . Hyperlipidemia   . Hypertension   . Hypothyroidism   . IBS (irritable bowel syndrome)   . Irregular heart rate    as a child, has never had any problems   . Left ureteral stone   . Migraine   . OSA (obstructive sleep apnea)    pt used cpap up until 2013 states lost wt and did not need anymore  . Restless legs   . Umbilical hernia without obstruction or gangrene   . Urgency of urination   . Vitamin D deficiency    Past Surgical History:  Procedure Laterality Date  . ABDOMINAL HYSTERECTOMY    . BUNIONECTOMY Right 2004  . COLONOSCOPY    . CYSTOSCOPY W/ RETROGRADES Bilateral 05/26/2015   Procedure: CYSTOSCOPY WITH RETROGRADE PYELOGRAM;  Surgeon: Festus Aloe, MD;  Location: [facility];  Service: Urology;  Laterality: Bilateral;  . CYSTOSCOPY W/ URETERAL STENT REMOVAL Left 05/26/2015   Procedure: CYSTOSCOPY WITH STENT REMOVAL;  Surgeon: Festus Aloe, MD;  Location: [facility];  Service: Urology;  Laterality: Left;  . CYSTOSCOPY WITH RETROGRADE PYELOGRAM, URETEROSCOPY AND STENT PLACEMENT Left 05/01/2015   Procedure: CYSTOSCOPY WITH RETROGRADE PYELOGRAM AND STENT PLACEMENT;  Surgeon: Festus Aloe, MD;  [facility];  Service: Urology;  Laterality: Left;  . CYSTOSCOPY WITH STENT PLACEMENT Bilateral 05/26/2015   Procedure: CYSTOSCOPY WITH STENT PLACEMENT;  Surgeon: Festus Aloe, MD;  Location: [facility];  Service: Urology;  Laterality: Bilateral;  .  CYSTOSCOPY WITH URETEROSCOPY Bilateral 05/26/2015   Procedure: CYSTOSCOPY WITH URETEROSCOPY;  Surgeon: Festus Aloe, MD;  Location: [facility];  Service: Urology;  Laterality: Bilateral;  . CYSTOSCOPY/URETEROSCOPY/HOLMIUM LASER/STENT PLACEMENT Right 06/09/2015   Procedure: CYSTOSCOPY/URETEROSCOPY/STENT PLACEMENT REMOVAL LEFT URETERAL STENT;  Surgeon: Festus Aloe, MD;  Location: [facility];  Service: Urology;   Laterality: Right;  . HOLMIUM LASER APPLICATION Left 54/62/7035   Procedure: HOLMIUM LASER APPLICATION;  Surgeon: Festus Aloe, MD;  Location: [facility];  Service: Urology;  Laterality: Left;  . HOLMIUM LASER APPLICATION Right 00/93/8182   Procedure: HOLMIUM LASER APPLICATION;  Surgeon: Festus Aloe, MD;  Location: [facility];  Service: Urology;  Laterality: Right;  . INSERTION OF MESH N/A 09/05/2016   Procedure: INSERTION OF MESH;  Surgeon: [name], MD;  Location: [place];  Service: General;  Laterality: N/A;  . LAPAROSCOPIC ASSISTED VAGINAL HYSTERECTOMY  04-23-2002   and Left Salpingoophorectomy/  Anterior Repair/  Tension Free Tape Sling placement  . LASER ABLATION OF THE CERVIX  x2  1980's  . TUBAL LIGATION  1980's  . VENTRAL HERNIA REPAIR N/A 09/05/2016   Procedure: LAPAROSCOPIC VENTRAL HERNIA;  Surgeon: [name], MD;  Location: [place];  Service: General;  Laterality: N/A;    reports that she has never smoked. She has never used smokeless tobacco. She reports that she does not drink alcohol or use drugs. family history includes Alcohol abuse in her mother; Arthritis in her father; Cancer in her maternal grandmother and paternal grandmother; Heart disease in her father; Hyperlipidemia in her father; Hypertension in her father; Kidney disease in her paternal uncle; Lung cancer in her mother. Allergies  Allergen Reactions  . Keflex [Cephalexin] Swelling    Tongue swelling     Review of Systems  Constitutional: Negative for chills, fever and unexpected weight change.  HENT: Negative for trouble swallowing.   Respiratory: Negative for shortness of breath.   Cardiovascular: Negative for chest pain.  Gastrointestinal: Positive for abdominal pain. Negative for abdominal distention, blood in stool, constipation, nausea and vomiting.  Genitourinary: Negative for dysuria.  Musculoskeletal: Negative for back pain.       Objective:   Physical Exam   Constitutional: She appears well-developed and well-nourished.  Cardiovascular: Normal rate and regular rhythm.   Pulmonary/Chest: Effort normal and breath sounds normal. No respiratory distress. She has no wheezes. She has no rales.  Abdominal: Soft. Bowel sounds are normal. She exhibits no mass. There is no rebound and no guarding.  Mild tenderness epigastric region with deep palpation       Assessment:     #1 Persistent epigastric pains. She did see some improvement with Carafate in combination with Protonix. Question of prior history of Helicobacter pylori. She denies any red flags such as weight loss, persistent vomiting, melena. No evidence for gallstones  #2 urine frequency.  Urine dip normal.      Plan:     -Refilled Carafate since this provided some symptomatic improvement -Check helicobacter pylori stool antigen -Set up GI referral for further evaluation. She thinks she is due for repeat colonoscopy but was not sure of date of last colonoscopy  [name] MD [facility]

## 2017-04-08 ENCOUNTER — Other Ambulatory Visit: Payer: Self-pay | Admitting: Family Medicine

## 2017-04-12 ENCOUNTER — Other Ambulatory Visit: Payer: Self-pay | Admitting: Family Medicine

## 2017-04-12 NOTE — Telephone Encounter (Signed)
Refill once OK. 

## 2017-04-13 ENCOUNTER — Telehealth: Payer: Self-pay | Admitting: Family Medicine

## 2017-04-13 NOTE — Telephone Encounter (Signed)
Pt request refill of the following: amphetamine-dextroamphetamine (ADDERALL) 20 MG tablet ° ° °Phamacy: ° °

## 2017-04-14 ENCOUNTER — Telehealth: Payer: Self-pay

## 2017-04-14 NOTE — Telephone Encounter (Signed)
Fill 04/17/17 as per pharmacy.

## 2017-04-14 NOTE — Telephone Encounter (Signed)
Rx ready for pick up and Left message on machine for patient   

## 2017-04-14 NOTE — Telephone Encounter (Signed)
[facility] is calling to see if we gave permission to pt to have an early fill on her Adderall. The rx was written 03/21/17 but pt picked up yesterday. She told the pharmacist that Dr. Elease Hashimoto told her she could fill it early. They are calling to confirm. I see no notes in her chart pertaining to any authorization for an early fill. Per pharmacy the last fills were: 03/20/17  #60 - they let her fill 2 days early as a courtesy 02/19/17  #60  Dr. Elease Hashimoto - Please advise. Thanks!

## 2017-04-14 NOTE — Telephone Encounter (Signed)
Pharmacist is aware.  

## 2017-04-14 NOTE — Telephone Encounter (Signed)
Spoke with pharmacist and the prescription should be filled 04/17/18.  Okay to fill today?

## 2017-04-21 ENCOUNTER — Other Ambulatory Visit: Payer: Self-pay | Admitting: Family Medicine

## 2017-04-21 NOTE — Telephone Encounter (Signed)
Refill once 

## 2017-04-24 ENCOUNTER — Ambulatory Visit: Payer: Managed Care, Other (non HMO) | Admitting: Physician Assistant

## 2017-04-27 ENCOUNTER — Other Ambulatory Visit: Payer: Self-pay | Admitting: Family Medicine

## 2017-04-29 ENCOUNTER — Other Ambulatory Visit: Payer: Self-pay | Admitting: Family Medicine

## 2017-05-01 ENCOUNTER — Other Ambulatory Visit: Payer: Self-pay | Admitting: Family Medicine

## 2017-05-02 ENCOUNTER — Encounter: Payer: Self-pay | Admitting: Physician Assistant

## 2017-05-09 ENCOUNTER — Ambulatory Visit: Payer: Managed Care, Other (non HMO) | Admitting: Physician Assistant

## 2017-05-14 ENCOUNTER — Other Ambulatory Visit: Payer: Self-pay | Admitting: Family Medicine

## 2017-05-15 ENCOUNTER — Encounter: Payer: Self-pay | Admitting: Family Medicine

## 2017-05-16 ENCOUNTER — Telehealth: Payer: Self-pay | Admitting: Family Medicine

## 2017-05-16 DIAGNOSIS — G4733 Obstructive sleep apnea (adult) (pediatric): Secondary | ICD-10-CM

## 2017-05-16 NOTE — Telephone Encounter (Signed)
Okay to order?

## 2017-05-16 NOTE — Telephone Encounter (Signed)
Ok.   We did discuss this at 12-22-16 visit.

## 2017-05-16 NOTE — Telephone Encounter (Signed)
Copied from [place] [phone]. Topic: General - Other >> May 16, 2017  3:28 PM [name] wrote: Reason for CRM: pt states her CPAP machine broken and was told it cannot be fixed. Pt is requesting a letter from Dr Elease Hashimoto stating the CPAP is medically necessary, she benefits from the machine, she uses it, and it has been discussed within the last 6 months at [place]. Pt needs faxed to [facility] [phone]

## 2017-05-17 NOTE — Telephone Encounter (Signed)
Order placed and patient is aware

## 2017-05-22 ENCOUNTER — Telehealth: Payer: Self-pay | Admitting: Family Medicine

## 2017-05-22 ENCOUNTER — Other Ambulatory Visit: Payer: Self-pay | Admitting: Family Medicine

## 2017-05-22 DIAGNOSIS — G4733 Obstructive sleep apnea (adult) (pediatric): Secondary | ICD-10-CM

## 2017-05-22 NOTE — Telephone Encounter (Signed)
Spoke with patient and new order for CPAP placed

## 2017-05-22 NOTE — Telephone Encounter (Signed)
Copied from [place]. Topic: General - Other >> May 22, 2017  2:00 PM Carolyn Stare wrote: Reason for CRM:   pt would like Apolonio Schneiders to call her concerning her sleep pap machine    [phone]

## 2017-05-23 ENCOUNTER — Telehealth: Payer: Self-pay | Admitting: Family Medicine

## 2017-05-23 NOTE — Telephone Encounter (Signed)
Copied from [place]. Topic: Referral - Request >> May 23, 2017  1:47 PM [name] Sherman, NT wrote: Reason for CRM: Patients husband is calling on behalf of his wife. They would like an order for a sleep study to be ordered for her.

## 2017-05-24 NOTE — Telephone Encounter (Signed)
Spoke with the husband.  [facility]

## 2017-05-29 ENCOUNTER — Other Ambulatory Visit: Payer: Self-pay | Admitting: Family Medicine

## 2017-06-11 ENCOUNTER — Other Ambulatory Visit: Payer: Self-pay | Admitting: Family Medicine

## 2017-06-16 ENCOUNTER — Ambulatory Visit: Payer: Managed Care, Other (non HMO) | Admitting: Family Medicine

## 2017-06-27 ENCOUNTER — Other Ambulatory Visit: Payer: Self-pay | Admitting: Family Medicine

## 2017-06-29 ENCOUNTER — Ambulatory Visit (INDEPENDENT_AMBULATORY_CARE_PROVIDER_SITE_OTHER): Payer: BLUE CROSS/BLUE SHIELD | Admitting: Internal Medicine

## 2017-06-29 ENCOUNTER — Encounter: Payer: Self-pay | Admitting: Internal Medicine

## 2017-06-29 VITALS — BP 118/78 | HR 121 | Temp 98.7°F | Resp 20

## 2017-06-29 DIAGNOSIS — B9789 Other viral agents as the cause of diseases classified elsewhere: Secondary | ICD-10-CM | POA: Diagnosis not present

## 2017-06-29 DIAGNOSIS — I10 Essential (primary) hypertension: Secondary | ICD-10-CM | POA: Diagnosis not present

## 2017-06-29 DIAGNOSIS — J069 Acute upper respiratory infection, unspecified: Secondary | ICD-10-CM | POA: Diagnosis not present

## 2017-06-29 MED ORDER — HYDROCODONE-ACETAMINOPHEN 5-325 MG PO TABS: 1.0000 | ORAL_TABLET | Freq: Four times a day (QID) | ORAL | 0 refills | Status: DC | PRN

## 2017-06-29 NOTE — Progress Notes (Signed)
Subjective:    Patient ID: [name] Sherman, female    DOB: [birth year], 57 y.o.   MRN: [id number]  HPI  57 year old patient who has a history of essential hypertension.  She also has OSA. For the past 3 days she has had sore throat sinus congestion cough sinus pressure and general sense of unwellness.  There is been no fever. She has had a difficult time sleeping due to the congestion  Past Medical History:  Diagnosis Date  . ADD (attention deficit disorder)   . Anemia   . Arthritis    shoulder, right (arthritis, tendonitis, bursitis)  . Cancer ([place])    cervical cancer (conization)   . History of cervical cancer    s/p  laser ablation of cervix 1980's  . History of kidney stones   . Hyperlipidemia   . Hypertension   . Hypothyroidism   . IBS (irritable bowel syndrome)   . Irregular heart rate    as a child, has never had any problems   . Left ureteral stone   . Migraine   . OSA (obstructive sleep apnea)    pt used cpap up until 2013 states lost wt and did not need anymore  . Restless legs   . Umbilical hernia without obstruction or gangrene   . Urgency of urination   . Vitamin D deficiency      Social History   Socioeconomic History  . Marital status: Legally Separated    Spouse name: Not on file  . Number of children: Not on file  . Years of education: Not on file  . Highest education level: Not on file  Social Needs  . Financial resource strain: Not on file  . Food insecurity - worry: Not on file  . Food insecurity - inability: Not on file  . Transportation needs - medical: Not on file  . Transportation needs - non-medical: Not on file  Occupational History  . Not on file  Tobacco Use  . Smoking status: Never Smoker  . Smokeless tobacco: Never Used  Substance and Sexual Activity  . Alcohol use: No    Comment: 1 x year  . Drug use: No  . Sexual activity: No  Other Topics Concern  . Not on file  Social History Narrative  . Not on file    Past  Surgical History:  Procedure Laterality Date  . ABDOMINAL HYSTERECTOMY    . BUNIONECTOMY Right 2004  . COLONOSCOPY    . CYSTOSCOPY W/ RETROGRADES Bilateral 05/26/2015   Procedure: CYSTOSCOPY WITH RETROGRADE PYELOGRAM;  Surgeon: Festus Aloe, MD;  Location: [facility];  Service: Urology;  Laterality: Bilateral;  . CYSTOSCOPY W/ URETERAL STENT REMOVAL Left 05/26/2015   Procedure: CYSTOSCOPY WITH STENT REMOVAL;  Surgeon: Festus Aloe, MD;  Location: [facility];  Service: Urology;  Laterality: Left;  . CYSTOSCOPY WITH RETROGRADE PYELOGRAM, URETEROSCOPY AND STENT PLACEMENT Left 05/01/2015   Procedure: CYSTOSCOPY WITH RETROGRADE PYELOGRAM AND STENT PLACEMENT;  Surgeon: Festus Aloe, MD;  Location: [facility];  Service: Urology;  Laterality: Left;  . CYSTOSCOPY WITH STENT PLACEMENT Bilateral 05/26/2015   Procedure: CYSTOSCOPY WITH STENT PLACEMENT;  Surgeon: Festus Aloe, MD;  Location: [facility];  Service: Urology;  Laterality: Bilateral;  . CYSTOSCOPY WITH URETEROSCOPY Bilateral 05/26/2015   Procedure: CYSTOSCOPY WITH URETEROSCOPY;  Surgeon: Festus Aloe, MD;  Location: [facility];  Service: Urology;  Laterality: Bilateral;  . CYSTOSCOPY/URETEROSCOPY/HOLMIUM LASER/STENT PLACEMENT Right 06/09/2015   Procedure: CYSTOSCOPY/URETEROSCOPY/STENT PLACEMENT REMOVAL  LEFT URETERAL STENT;  Surgeon: Festus Aloe, MD;  Location: [facility];  Service: Urology;  Laterality: Right;  . HOLMIUM LASER APPLICATION Left 64/33/2951   Procedure: HOLMIUM LASER APPLICATION;  Surgeon: Festus Aloe, MD;  Location: [facility];  Service: Urology;  Laterality: Left;  . HOLMIUM LASER APPLICATION Right 88/41/6606   Procedure: HOLMIUM LASER APPLICATION;  Surgeon: Festus Aloe, MD;  Location: [facility];  Service: Urology;  Laterality: Right;  . INSERTION OF MESH N/A 09/05/2016   Procedure: INSERTION OF MESH;  Surgeon: [name],  MD;  Location: [place];  Service: General;  Laterality: N/A;  . LAPAROSCOPIC ASSISTED VAGINAL HYSTERECTOMY  04-23-2002   and Left Salpingoophorectomy/  Anterior Repair/  Tension Free Tape Sling placement  . LASER ABLATION OF THE CERVIX  x2  1980's  . TUBAL LIGATION  1980's  . VENTRAL HERNIA REPAIR N/A 09/05/2016   Procedure: LAPAROSCOPIC VENTRAL HERNIA;  Surgeon: [name], MD;  Location: [place];  Service: General;  Laterality: N/A;    Family History  Problem Relation Age of Onset  . Alcohol abuse Mother   . Lung cancer Mother   . Arthritis Father   . Hyperlipidemia Father   . Heart disease Father   . Hypertension Father   . Kidney disease Paternal Uncle   . Cancer Maternal Grandmother        colon  . Cancer Paternal Grandmother        breast    Allergies  Allergen Reactions  . Keflex [Cephalexin] Swelling    Tongue swelling    Current Outpatient Medications on File Prior to Visit  Medication Sig Dispense Refill  . amphetamine-dextroamphetamine (ADDERALL) 20 MG tablet Take 1 tablet (20 mg total) by mouth 2 (two) times daily. 60 tablet 0  . amphetamine-dextroamphetamine (ADDERALL) 20 MG tablet Take 1 tablet (20 mg total) by mouth 2 (two) times daily. Fill in one month 60 tablet 0  . amphetamine-dextroamphetamine (ADDERALL) 20 MG tablet Take 1 tablet (20 mg total) by mouth 2 (two) times daily. 60 tablet 0  . atorvastatin (LIPITOR) 10 MG tablet TAKE 1 TABLET(10 MG) BY MOUTH DAILY 90 tablet 3  . CARAFATE 1 GM/10ML suspension TAKE 10 ML BY MOUTH FOUR TIMES DAILY, WITH MEALS AND AT BEDTIME 420 mL 0  . citalopram (CELEXA) 20 MG tablet TAKE 3 TABLETS BY MOUTH NIGHTLY AT BEDTIME OR AS DIRECTED 90 tablet 0  . citalopram (CELEXA) 20 MG tablet TAKE 3 TABLETS BY MOUTH EVERY NIGHT AT BEDTIME OR AS DIRECTED 90 tablet 1  . furosemide (LASIX) 20 MG tablet TAKE 1 TABLET BY MOUTH EVERY DAY 90 tablet 0  . ibuprofen (ADVIL,MOTRIN) 800 MG tablet Take 1 tablet (800 mg total) by mouth 3 (three)  times daily. 21 tablet 0  . levothyroxine (SYNTHROID, LEVOTHROID) 50 MCG tablet Take 1 tablet (50 mcg total) by mouth daily. 90 tablet 3  . levothyroxine (SYNTHROID, LEVOTHROID) 50 MCG tablet TAKE 1 TABLET(50 MCG) BY MOUTH DAILY 90 tablet 1  . losartan-hydrochlorothiazide (HYZAAR) 50-12.5 MG tablet Take 1 tablet by mouth daily. 90 tablet 3  . pantoprazole (PROTONIX) 40 MG tablet TAKE 1 TABLET(40 MG) BY MOUTH DAILY 90 tablet 1  . SUMAtriptan (IMITREX) 100 MG tablet Take 1 tablet (100 mg total) by mouth every 2 (two) hours as needed for migraine. May repeat in 2 hours if headache persists or recurs. 10 tablet 11  . Vitamin D, Ergocalciferol, (DRISDOL) 50000 units CAPS capsule TAKE 1 CAPSULE BY MOUTH 1 TIME  A WEEK 12 capsule 0   No current facility-administered medications on file prior to visit.     BP 118/78 (BP Location: Left Arm, Patient Position: Sitting, Cuff Size: Normal)   Pulse (!) 121   Temp 98.7 F (37.1 C) (Oral)   Resp 20   SpO2 97%     Review of Systems  Constitutional: Positive for activity change, appetite change and fatigue. Negative for chills and fever.  HENT: Positive for congestion, rhinorrhea, sinus pressure, sinus pain, sore throat and voice change. Negative for dental problem, hearing loss and tinnitus.   Eyes: Negative for pain, discharge and visual disturbance.  Respiratory: Positive for cough. Negative for shortness of breath.   Cardiovascular: Negative for chest pain, palpitations and leg swelling.  Gastrointestinal: Negative for abdominal distention, abdominal pain, blood in stool, constipation, diarrhea, nausea and vomiting.  Genitourinary: Negative for difficulty urinating, dysuria, flank pain, frequency, hematuria, pelvic pain, urgency, vaginal bleeding, vaginal discharge and vaginal pain.  Musculoskeletal: Negative for arthralgias, gait problem and joint swelling.  Skin: Negative for rash.  Neurological: Negative for dizziness, syncope, speech difficulty,  weakness, numbness and headaches.  Hematological: Negative for adenopathy.  Psychiatric/Behavioral: Positive for sleep disturbance. Negative for agitation, behavioral problems and dysphoric mood. The patient is not nervous/anxious.        Objective:   Physical Exam  Constitutional: She is oriented to person, place, and time. She appears well-developed and well-nourished.  HENT:  Head: Normocephalic.  Right Ear: External ear normal.  Left Ear: External ear normal.  Mouth/Throat: Oropharynx is clear and moist.  No focal sinus tenderness Both tympanic membranes normal Oropharynx appeared only minimally erythematous  Eyes: Conjunctivae and EOM are normal. Pupils are equal, round, and reactive to light.  Neck: Normal range of motion. Neck supple. No thyromegaly present.  Cardiovascular: Normal rate, regular rhythm, normal heart sounds and intact distal pulses.  Pulmonary/Chest: Effort normal and breath sounds normal. No respiratory distress. She has no wheezes. She has no rales.  Abdominal: Soft. Bowel sounds are normal. She exhibits no mass. There is no tenderness.  Musculoskeletal: Normal range of motion.  Lymphadenopathy:    She has no cervical adenopathy.  Neurological: She is alert and oriented to person, place, and time.  Skin: Skin is warm and dry. No rash noted.  Psychiatric: She has a normal mood and affect. Her behavior is normal.          Assessment & Plan:   Viral URI with cough/pharyngitis.  Will treat symptomatically Note to excuse from work for the next few days written Essential hypertension stable   Follow-up PCP if unimproved  Nyoka Cowden

## 2017-06-29 NOTE — Patient Instructions (Addendum)
Acute sinusitis symptoms for less than 10 days are generally not helped by antibiotics.  Take over-the-counter expectorants and cough medications such as  Mucinex DM.  Call if there is no improvement in 5 to 7 days or if  you develop worsening cough, fever, or new symptoms, such as shortness of breath or chest pain.  How is this treated? Treatment for sinusitis depends on the cause and whether your condition is chronic or acute. If a virus is causing your sinusitis, your symptoms will go away on their own within 10 days. You may be given medicines to relieve your symptoms, including:  Topical nasal decongestants. They shrink swollen nasal passages and let mucus drain from your sinuses.  Antihistamines. These drugs block inflammation that is triggered by allergies. This can help to ease swelling in your nose and sinuses.  Topical nasal corticosteroids. These are nasal sprays that ease inflammation and swelling in your nose and sinuses.  Nasal saline washes. These rinses can help to get rid of thick mucus in your nose.   Follow these instructions at home:   Hydrate and Humidify  Drink enough water to keep your urine clear or pale yellow. Staying hydrated will help to thin your mucus.  Use a cool mist humidifier to keep the humidity level in your home above 50%.  Inhale steam for 10-15 minutes, 3-4 times a day or as told by your health care provider. You can do this in the bathroom while a hot shower is running.  Limit your exposure to cool or dry air. Rest  Rest as much as possible.  Sleep with your head raised (elevated).

## 2017-07-06 ENCOUNTER — Telehealth: Payer: Self-pay | Admitting: Family Medicine

## 2017-07-06 ENCOUNTER — Other Ambulatory Visit: Payer: Self-pay

## 2017-07-06 ENCOUNTER — Encounter (HOSPITAL_COMMUNITY): Payer: Self-pay | Admitting: Emergency Medicine

## 2017-07-06 ENCOUNTER — Ambulatory Visit (HOSPITAL_COMMUNITY)
Admission: EM | Admit: 2017-07-06 | Discharge: 2017-07-06 | Disposition: A | Discharge status: Home / Self Care | Payer: BLUE CROSS/BLUE SHIELD | Attending: Family Medicine | Admitting: Family Medicine

## 2017-07-06 DIAGNOSIS — J0140 Acute pansinusitis, unspecified: Secondary | ICD-10-CM

## 2017-07-06 DIAGNOSIS — Z79899 Other long term (current) drug therapy: Secondary | ICD-10-CM | POA: Diagnosis not present

## 2017-07-06 DIAGNOSIS — R05 Cough: Secondary | ICD-10-CM | POA: Diagnosis present

## 2017-07-06 DIAGNOSIS — J029 Acute pharyngitis, unspecified: Secondary | ICD-10-CM | POA: Diagnosis present

## 2017-07-06 LAB — POCT RAPID STREP A: Streptococcus, Group A Screen (Direct): NEGATIVE

## 2017-07-06 MED ORDER — FLUTICASONE PROPIONATE 50 MCG/ACT NA SUSP: 2.0000 | Freq: Every day | NASAL | 0 refills | Status: DC

## 2017-07-06 MED ORDER — BENZONATATE 100 MG PO CAPS: 100.0000 mg | ORAL_CAPSULE | Freq: Three times a day (TID) | ORAL | 0 refills | Status: DC

## 2017-07-06 MED ORDER — HYDROCOD POLST-CPM POLST ER 10-8 MG/5ML PO SUER: 5.0000 mL | Freq: Every evening | ORAL | 0 refills | Status: DC | PRN

## 2017-07-06 MED ORDER — DOXYCYCLINE HYCLATE 100 MG PO CAPS: 100.0000 mg | ORAL_CAPSULE | Freq: Two times a day (BID) | ORAL | 0 refills | Status: DC

## 2017-07-06 NOTE — ED Triage Notes (Signed)
Pt c/o cough, sore throat, states "it feels like im swallowing glass".

## 2017-07-06 NOTE — Telephone Encounter (Signed)
I spoke with pt, she was here on 06/29/2017 and not getting any better. We have no openings today, advised pt to go to a Urgent Care, she was coughing a lot and very hoarse. Pt agreed to follow advice and get evaluated today.

## 2017-07-06 NOTE — Discharge Instructions (Addendum)
Start doxycycline for sinus infection.  Tessalon for cough during the day.  Tussionex for cough during the night.  Continue decongestants as directed.  Start Flonase for nasal congestion.  Stay hydrated, urine should be clear to pale yellow in color.  Follow-up with PCP for further evaluation and treatment needed.  If experiencing worsening symptoms, chest pain, shortness of breath,  wheezing, weakness, dizziness, follow-up for reevaluation.

## 2017-07-06 NOTE — Telephone Encounter (Signed)
Spouse calling back, requesting call back [phone]

## 2017-07-06 NOTE — Telephone Encounter (Unsigned)
Copied from [place] [phone]. Topic: Quick Communication - See Telephone Encounter >> Jul 06, 2017 11:19 AM Percell Belt A wrote: CRM for notification. See Telephone encounter for: pt called in and said that she was seen on the 3rd and is not any better, much worse.  She was barley able to talk. She would like to know if they can call in something for her?    Pharmacy -   [facility]   07/06/17.

## 2017-07-06 NOTE — ED Provider Notes (Signed)
[facility]    CSN: [id number] Arrival date & time: 07/06/17  1512     History   Chief Complaint Chief Complaint  Patient presents with  . Cough  . Sore Throat    HPI [name] Sherman is a 57 y.o. female.   57 year old female comes in for 10-day history of URI symptoms.  She has had nasal congestion, mild productive cough, left ear pain, sore throat.  States saw her PCP last week, and was told it was a virus, and was told to take decongestants, which helped with the rhinorrhea.  She was also given Norco to help with the pain and to help with sleep, which she states has not been helping.  Denies fever, chills, night sweats.  States sore throat and headache has been the worst.  Denies chest pain, shortness of breath, wheezing.  Never smoker.      Past Medical History:  Diagnosis Date  . ADD (attention deficit disorder)   . Anemia   . Arthritis    shoulder, right (arthritis, tendonitis, bursitis)  . Cancer ([place])    cervical cancer (conization)   . History of cervical cancer    s/p  laser ablation of cervix 1980's  . History of kidney stones   . Hyperlipidemia   . Hypertension   . Hypothyroidism   . IBS (irritable bowel syndrome)   . Irregular heart rate    as a child, has never had any problems   . Left ureteral stone   . Migraine   . OSA (obstructive sleep apnea)    pt used cpap up until 2013 states lost wt and did not need anymore  . Restless legs   . Umbilical hernia without obstruction or gangrene   . Urgency of urination   . Vitamin D deficiency     Patient Active Problem List   Diagnosis Date Noted  . Hypothyroid 12/19/2016  . Essential hypertension 09/21/2015  . UTI (lower urinary tract infection) 05/01/2015  . Ureteral stone with hydronephrosis 05/01/2015  . ADD (attention deficit disorder) 12/26/2012  . Incisional hernia, without obstruction or gangrene 12/26/2012  . Migraine headache 11/14/2012  . Hyperlipidemia 11/14/2012  . OSA  (obstructive sleep apnea) 11/14/2012    Past Surgical History:  Procedure Laterality Date  . ABDOMINAL HYSTERECTOMY    . BUNIONECTOMY Right 2004  . COLONOSCOPY    . CYSTOSCOPY W/ RETROGRADES Bilateral 05/26/2015   Procedure: CYSTOSCOPY WITH RETROGRADE PYELOGRAM;  Surgeon: Festus Aloe, MD;  Location: [facility];  Service: Urology;  Laterality: Bilateral;  . CYSTOSCOPY W/ URETERAL STENT REMOVAL Left 05/26/2015   Procedure: CYSTOSCOPY WITH STENT REMOVAL;  Surgeon: Festus Aloe, MD;  Location: [facility];  Service: Urology;  Laterality: Left;  . CYSTOSCOPY WITH RETROGRADE PYELOGRAM, URETEROSCOPY AND STENT PLACEMENT Left 05/01/2015   Procedure: CYSTOSCOPY WITH RETROGRADE PYELOGRAM AND STENT PLACEMENT;  Surgeon: Festus Aloe, MD;  Location: [facility];  Service: Urology;  Laterality: Left;  . CYSTOSCOPY WITH STENT PLACEMENT Bilateral 05/26/2015   Procedure: CYSTOSCOPY WITH STENT PLACEMENT;  Surgeon: Festus Aloe, MD;  Location: [facility];  Service: Urology;  Laterality: Bilateral;  . CYSTOSCOPY WITH URETEROSCOPY Bilateral 05/26/2015   Procedure: CYSTOSCOPY WITH URETEROSCOPY;  Surgeon: Festus Aloe, MD;  Location: [facility];  Service: Urology;  Laterality: Bilateral;  . CYSTOSCOPY/URETEROSCOPY/HOLMIUM LASER/STENT PLACEMENT Right 06/09/2015   Procedure: CYSTOSCOPY/URETEROSCOPY/STENT PLACEMENT REMOVAL LEFT URETERAL STENT;  Surgeon: Festus Aloe, MD;  Location: [facility];  Service: Urology;  Laterality: Right;  . HOLMIUM LASER APPLICATION Left 16/03/9603   Procedure: HOLMIUM LASER APPLICATION;  Surgeon: Festus Aloe, MD;  Location: [facility];  Service: Urology;  Laterality: Left;  . HOLMIUM LASER APPLICATION Right 54/02/8118   Procedure: HOLMIUM LASER APPLICATION;  Surgeon: Festus Aloe, MD;  Location: [facility];  Service: Urology;  Laterality: Right;  . INSERTION OF MESH N/A 09/05/2016   Procedure:  INSERTION OF MESH;  Surgeon: [name], MD;  Location: [place];  Service: General;  Laterality: N/A;  . LAPAROSCOPIC ASSISTED VAGINAL HYSTERECTOMY  04-23-2002   and Left Salpingoophorectomy/  Anterior Repair/  Tension Free Tape Sling placement  . LASER ABLATION OF THE CERVIX  x2  1980's  . TUBAL LIGATION  1980's  . VENTRAL HERNIA REPAIR N/A 09/05/2016   Procedure: LAPAROSCOPIC VENTRAL HERNIA;  Surgeon: [name], MD;  Location: [place];  Service: General;  Laterality: N/A;    OB History    No data available       Home Medications    Prior to Admission medications   Medication Sig Start Date End Date Taking? Authorizing Provider  amphetamine-dextroamphetamine (ADDERALL) 20 MG tablet Take 1 tablet (20 mg total) by mouth 2 (two) times daily. 03/21/17   Burchette, Alinda Sierras, MD  amphetamine-dextroamphetamine (ADDERALL) 20 MG tablet Take 1 tablet (20 mg total) by mouth 2 (two) times daily. Fill in one month 03/21/17   Burchette, Alinda Sierras, MD  amphetamine-dextroamphetamine (ADDERALL) 20 MG tablet Take 1 tablet (20 mg total) by mouth 2 (two) times daily. 03/21/17   Burchette, Alinda Sierras, MD  atorvastatin (LIPITOR) 10 MG tablet TAKE 1 TABLET(10 MG) BY MOUTH DAILY 12/19/16   Burchette, Alinda Sierras, MD  benzonatate (TESSALON) 100 MG capsule Take 1 capsule (100 mg total) by mouth every 8 (eight) hours. 07/06/17   Yu, Amy V, PA-C  CARAFATE 1 GM/10ML suspension TAKE 10 ML BY MOUTH FOUR TIMES DAILY, WITH MEALS AND AT BEDTIME Patient not taking: Reported on 07/06/2017 04/21/17   [name], MD  chlorpheniramine-HYDROcodone (TUSSIONEX PENNKINETIC ER) 10-8 MG/5ML SUER Take 5 mLs by mouth at bedtime as needed for cough. 07/06/17   Tasia Catchings, Amy V, PA-C  citalopram (CELEXA) 20 MG tablet TAKE 3 TABLETS BY MOUTH NIGHTLY AT BEDTIME OR AS DIRECTED 03/07/17   Burchette, Alinda Sierras, MD  citalopram (CELEXA) 20 MG tablet TAKE 3 TABLETS BY MOUTH EVERY NIGHT AT BEDTIME OR AS DIRECTED 06/12/17   Burchette, Alinda Sierras, MD    doxycycline (VIBRAMYCIN) 100 MG capsule Take 1 capsule (100 mg total) by mouth 2 (two) times daily. 07/06/17   Tasia Catchings, Amy V, PA-C  fluticasone (FLONASE) 50 MCG/ACT nasal spray Place 2 sprays into both nostrils daily. 07/06/17   Tasia Catchings, Amy V, PA-C  furosemide (LASIX) 20 MG tablet TAKE 1 TABLET BY MOUTH EVERY DAY 05/29/17   Burchette, Alinda Sierras, MD  HYDROcodone-acetaminophen (NORCO/VICODIN) 5-325 MG tablet Take 1 tablet by mouth every 6 (six) hours as needed for moderate pain. 06/29/17   Marletta Lor, MD  ibuprofen (ADVIL,MOTRIN) 800 MG tablet Take 1 tablet (800 mg total) by mouth 3 (three) times daily. 05/21/15   Palumbo, April, MD  levothyroxine (SYNTHROID, LEVOTHROID) 50 MCG tablet Take 1 tablet (50 mcg total) by mouth daily. 12/19/16   Burchette, Alinda Sierras, MD  levothyroxine (SYNTHROID, LEVOTHROID) 50 MCG tablet TAKE 1 TABLET(50 MCG) BY MOUTH DAILY 04/27/17   Burchette, Alinda Sierras, MD  losartan-hydrochlorothiazide (HYZAAR) 50-12.5 MG tablet Take 1 tablet by mouth daily.  12/19/16   Burchette, Alinda Sierras, MD  pantoprazole (PROTONIX) 40 MG tablet TAKE 1 TABLET(40 MG) BY MOUTH DAILY 06/28/17   Burchette, Alinda Sierras, MD  SUMAtriptan (IMITREX) 100 MG tablet Take 1 tablet (100 mg total) by mouth every 2 (two) hours as needed for migraine. May repeat in 2 hours if headache persists or recurs. 06/29/16   Burchette, Alinda Sierras, MD  Vitamin D, Ergocalciferol, (DRISDOL) 50000 units CAPS capsule TAKE 1 CAPSULE BY MOUTH 1 TIME A WEEK 03/07/17   Burchette, Alinda Sierras, MD    Family History Family History  Problem Relation Age of Onset  . Alcohol abuse Mother   . Lung cancer Mother   . Arthritis Father   . Hyperlipidemia Father   . Heart disease Father   . Hypertension Father   . Kidney disease Paternal Uncle   . Cancer Maternal Grandmother        colon  . Cancer Paternal Grandmother        breast    Social History Social History   Tobacco Use  . Smoking status: Never Smoker  . Smokeless tobacco: Never Used  Substance Use  Topics  . Alcohol use: No    Comment: 1 x year  . Drug use: No     Allergies   Keflex [cephalexin]   Review of Systems Review of Systems  Reason unable to perform ROS: See HPI as above.     Physical Exam Triage Vital Signs ED Triage Vitals  Enc Vitals Group     BP 07/06/17 1531 113/69     Pulse Rate 07/06/17 1531 90     Resp 07/06/17 1531 18     Temp 07/06/17 1531 99.1 F (37.3 C)     Temp src --      SpO2 07/06/17 1531 100 %     Weight --      Height --      Head Circumference --      Peak Flow --      Pain Score 07/06/17 1532 10     Pain Loc --      Pain Edu? --      Excl. in [place]? --    No data found.  Updated Vital Signs BP 113/69   Pulse 90   Temp 99.1 F (37.3 C)   Resp 18   SpO2 100%   Physical Exam  Constitutional: She is oriented to person, place, and time. She appears well-developed and well-nourished. No distress.  HENT:  Head: Normocephalic and atraumatic.  Right Ear: External ear and ear canal normal. Tympanic membrane is erythematous. Tympanic membrane is not bulging.  Left Ear: External ear and ear canal normal. Tympanic membrane is erythematous. Tympanic membrane is not bulging.  Nose: Right sinus exhibits maxillary sinus tenderness and frontal sinus tenderness. Left sinus exhibits maxillary sinus tenderness and frontal sinus tenderness.  Mouth/Throat: Uvula is midline and mucous membranes are normal. Posterior oropharyngeal erythema present. Tonsils are 1+ on the right. Tonsils are 1+ on the left. No tonsillar exudate.  Eyes: Conjunctivae are normal. Pupils are equal, round, and reactive to light.  Neck: Normal range of motion. Neck supple.  Cardiovascular: Normal rate, regular rhythm and normal heart sounds. Exam reveals no gallop and no friction rub.  No murmur heard. Pulmonary/Chest: Effort normal and breath sounds normal. She has no decreased breath sounds. She has no wheezes. She has no rhonchi. She has no rales.  Lymphadenopathy:     She has no cervical adenopathy.  Neurological: She is alert and oriented to person, place, and time.  Skin: Skin is warm and dry.  Psychiatric: She has a normal mood and affect. Her behavior is normal. Judgment normal.     UC Treatments / Results  Labs (all labs ordered are listed, but only abnormal results are displayed) Labs Reviewed  CULTURE, GROUP A STREP [facility])  POCT RAPID STREP A    EKG  EKG Interpretation None       Radiology No results found.  Procedures Procedures (including critical care time)  Medications Ordered in UC Medications - No data to display   Initial Impression / Assessment and Plan / UC Course  I have reviewed the triage vital signs and the nursing notes.  Pertinent labs & imaging results that were available during my care of the patient were reviewed by me and considered in my medical decision making (see chart for details).    Will treat for sinusitis with doxycycline.  Other symptomatic treatment discussed.  Patient to follow-up with PCP for further evaluation as needed.  Return precautions given.  Patient expresses understanding and agrees to plan.  Final Clinical Impressions(s) / UC Diagnoses   Final diagnoses:  Acute non-recurrent pansinusitis    ED Discharge Orders        Ordered    doxycycline (VIBRAMYCIN) 100 MG capsule  2 times daily     07/06/17 1614    fluticasone (FLONASE) 50 MCG/ACT nasal spray  Daily     07/06/17 1614    chlorpheniramine-HYDROcodone (TUSSIONEX PENNKINETIC ER) 10-8 MG/5ML SUER  At bedtime PRN     07/06/17 1614    benzonatate (TESSALON) 100 MG capsule  Every 8 hours     07/06/17 1614        Ok Edwards, PA-C 07/06/17 1626

## 2017-07-09 LAB — CULTURE, GROUP A STREP (THRC)

## 2017-07-12 ENCOUNTER — Telehealth: Payer: Self-pay | Admitting: Family Medicine

## 2017-07-12 NOTE — Telephone Encounter (Signed)
Refills OK. 

## 2017-07-12 NOTE — Telephone Encounter (Signed)
Copied from [place] [id number]. Topic: Quick Communication - Rx Refill/Question >> Jul 12, 2017 12:03 PM Burnis Medin, NT wrote: Medication: amphetamine-dextroamphetamine (ADDERALL) 20 MG tablet   Has the patient contacted their pharmacy? No. Medication can't be called in.   (Agent: If no, request that the patient contact the pharmacy for the refill.)   Preferred Pharmacy (with phone number or street name): Patient has to pick medication up and would like a call back when it's ready for pick up.   Agent: Please be advised that RX refills may take up to 3 business days. We ask that you follow-up with your pharmacy.

## 2017-07-12 NOTE — Telephone Encounter (Signed)
Requesting refill of Adderall  LOV 06/29/17 with Dr. Burnice Logan  Last refill 03/22/17  #60.

## 2017-07-12 NOTE — Telephone Encounter (Signed)
Last refill 03/21/17.  Last office visit with Dr Raliegh Ip 06/29/17 and with Dr Elease Hashimoto 04/04/17.  Okay to fill?

## 2017-07-14 MED ORDER — AMPHETAMINE-DEXTROAMPHETAMINE 20 MG PO TABS: 20.0000 mg | ORAL_TABLET | Freq: Two times a day (BID) | ORAL | 0 refills | Status: DC

## 2017-07-14 NOTE — Telephone Encounter (Signed)
rx ready for pick up and patient is aware  

## 2017-08-04 ENCOUNTER — Telehealth: Payer: Self-pay | Admitting: Family Medicine

## 2017-08-04 MED ORDER — SUMATRIPTAN SUCCINATE 100 MG PO TABS: 100.0000 mg | ORAL_TABLET | ORAL | 11 refills | Status: DC | PRN

## 2017-08-04 NOTE — Telephone Encounter (Signed)
Copied from [place]. Topic: Quick Communication - Rx Refill/Question >> Aug 04, 2017 12:26 PM Robina Ade, Helene Kelp D wrote: Medication:SUMAtriptan (IMITREX) 100 MG tablet Has the patient contacted their pharmacy?No (Agent: If no, request that the patient contact the pharmacy for the refill.) Preferred Pharmacy (with phone number or street name): [facility] [place] [place] Agent: Please be advised that RX refills may take up to 3 business days. We ask that you follow-up with your pharmacy.

## 2017-08-07 ENCOUNTER — Encounter: Payer: Self-pay | Admitting: Family Medicine

## 2017-08-07 ENCOUNTER — Ambulatory Visit (INDEPENDENT_AMBULATORY_CARE_PROVIDER_SITE_OTHER): Payer: BLUE CROSS/BLUE SHIELD | Admitting: Family Medicine

## 2017-08-07 VITALS — BP 110/80 | HR 99 | Temp 98.6°F

## 2017-08-07 DIAGNOSIS — R300 Dysuria: Secondary | ICD-10-CM

## 2017-08-07 LAB — POCT URINALYSIS DIPSTICK
BILIRUBIN UA: NEGATIVE
COLOR UA: NEGATIVE
Glucose, UA: NEGATIVE
KETONES UA: NEGATIVE
Leukocytes, UA: NEGATIVE
Nitrite, UA: POSITIVE
PH UA: 6 (ref 5.0–8.0)
Protein, UA: NEGATIVE
Spec Grav, UA: 1.025 (ref 1.010–1.025)
UROBILINOGEN UA: 0.2 U/dL

## 2017-08-07 MED ORDER — VITAMIN D (ERGOCALCIFEROL) 1.25 MG (50000 UNIT) PO CAPS: ORAL_CAPSULE | ORAL | 3 refills | Status: DC

## 2017-08-07 MED ORDER — NITROFURANTOIN MONOHYD MACRO 100 MG PO CAPS: 100.0000 mg | ORAL_CAPSULE | Freq: Two times a day (BID) | ORAL | 0 refills | Status: DC

## 2017-08-07 NOTE — Progress Notes (Signed)
Subjective:     Patient ID: [name] Sherman, female   DOB: 17-Mar-1961, 57 y.o.   MRN: [id number]  HPI Patient seen with one-day history of dysuria. She has some urine frequency and burning with urination. No gross hematuria. She has some chronic back pain but no acute back pain. No fevers or chills. No nausea or vomiting. Does have past history of kidney stones.  Past Medical History:  Diagnosis Date  . ADD (attention deficit disorder)   . Anemia   . Arthritis    shoulder, right (arthritis, tendonitis, bursitis)  . Cancer ([place])    cervical cancer (conization)   . History of cervical cancer    s/p  laser ablation of cervix 1980's  . History of kidney stones   . Hyperlipidemia   . Hypertension   . Hypothyroidism   . IBS (irritable bowel syndrome)   . Irregular heart rate    as a child, has never had any problems   . Left ureteral stone   . Migraine   . OSA (obstructive sleep apnea)    pt used cpap up until 2013 states lost wt and did not need anymore  . Restless legs   . Umbilical hernia without obstruction or gangrene   . Urgency of urination   . Vitamin D deficiency    Past Surgical History:  Procedure Laterality Date  . ABDOMINAL HYSTERECTOMY    . BUNIONECTOMY Right 2004  . COLONOSCOPY    . CYSTOSCOPY W/ RETROGRADES Bilateral 05/26/2015   Procedure: CYSTOSCOPY WITH RETROGRADE PYELOGRAM;  Surgeon: Festus Aloe, MD;  Location: [facility];  Service: Urology;  Laterality: Bilateral;  . CYSTOSCOPY W/ URETERAL STENT REMOVAL Left 05/26/2015   Procedure: CYSTOSCOPY WITH STENT REMOVAL;  Surgeon: Festus Aloe, MD;  Location: [facility];  Service: Urology;  Laterality: Left;  . CYSTOSCOPY WITH RETROGRADE PYELOGRAM, URETEROSCOPY AND STENT PLACEMENT Left 05/01/2015   Procedure: CYSTOSCOPY WITH RETROGRADE PYELOGRAM AND STENT PLACEMENT;  Surgeon: Festus Aloe, MD;  [facility];  Service: Urology;  Laterality: Left;  . CYSTOSCOPY WITH STENT  PLACEMENT Bilateral 05/26/2015   Procedure: CYSTOSCOPY WITH STENT PLACEMENT;  Surgeon: Festus Aloe, MD;  Location[facility];  Service: Urology;  Laterality: Bilateral;  . CYSTOSCOPY WITH URETEROSCOPY Bilateral 05/26/2015   Procedure: CYSTOSCOPY WITH URETEROSCOPY;  Surgeon: Festus Aloe, MD;  Location: [facility];  Service: Urology;  Laterality: Bilateral;  . CYSTOSCOPY/URETEROSCOPY/HOLMIUM LASER/STENT PLACEMENT Right 06/09/2015   Procedure: CYSTOSCOPY/URETEROSCOPY/STENT PLACEMENT REMOVAL LEFT URETERAL STENT;  Surgeon: Festus Aloe, MD;  [facility];  Service: Urology;  Laterality: Right;  . HOLMIUM LASER APPLICATION Left 13/24/4010   Procedure: HOLMIUM LASER APPLICATION;  Surgeon: Festus Aloe, MD;  Location: [facility];  Service: Urology;  Laterality: Left;  . HOLMIUM LASER APPLICATION Right 27/25/3664   Procedure: HOLMIUM LASER APPLICATION;  Surgeon: Festus Aloe, MD;  [facility];  Service: Urology;  Laterality: Right;  . INSERTION OF MESH N/A 09/05/2016   Procedure: INSERTION OF MESH;  Surgeon: [name], MD;  Location: [place];  Service: General;  Laterality: N/A;  . LAPAROSCOPIC ASSISTED VAGINAL HYSTERECTOMY  04-23-2002   and Left Salpingoophorectomy/  Anterior Repair/  Tension Free Tape Sling placement  . LASER ABLATION OF THE CERVIX  x2  1980's  . TUBAL LIGATION  1980's  . VENTRAL HERNIA REPAIR N/A 09/05/2016   Procedure: LAPAROSCOPIC VENTRAL HERNIA;  Surgeon: [name], MD;  Location: [place];  Service: General;  Laterality:  N/A;    reports that  has never smoked. she has never used smokeless tobacco. She reports that she does not drink alcohol or use drugs. family history includes Alcohol abuse in her mother; Arthritis in her father; Cancer in her maternal grandmother and paternal grandmother; Heart disease in her father; Hyperlipidemia in her father; Hypertension in her father; Kidney disease in  her paternal uncle; Lung cancer in her mother. Allergies  Allergen Reactions  . Keflex [Cephalexin] Swelling    Tongue swelling     Review of Systems  Constitutional: Negative for appetite change, chills and fever.  Gastrointestinal: Negative for abdominal pain, constipation, diarrhea, nausea and vomiting.  Genitourinary: Positive for dysuria and frequency. Negative for hematuria.  Musculoskeletal: Negative for back pain.  Neurological: Negative for dizziness.       Objective:   Physical Exam  Constitutional: She appears well-developed and well-nourished.  HENT:  Head: Normocephalic and atraumatic.  Neck: Neck supple. No thyromegaly present.  Cardiovascular: Normal rate, regular rhythm and normal heart sounds.  Pulmonary/Chest: Breath sounds normal.  Abdominal: Soft. Bowel sounds are normal. There is no tenderness.       Assessment:     Dysuria. Suspect uncomplicated cystitis    Plan:     -Urine culture sent -Start Macrobid 1 twice a day for 5 days pending cultures -Drink plenty of fluids and follow-up immediately for any fever or worsening symptoms  [name] MD  Primary Care at [facility]

## 2017-08-07 NOTE — Patient Instructions (Signed)

## 2017-08-09 LAB — URINE CULTURE
MICRO NUMBER: 90180034
SPECIMEN QUALITY:: ADEQUATE

## 2017-08-13 ENCOUNTER — Other Ambulatory Visit: Payer: Self-pay | Admitting: Family Medicine

## 2017-08-24 ENCOUNTER — Other Ambulatory Visit: Payer: Self-pay | Admitting: Family Medicine

## 2017-08-28 ENCOUNTER — Other Ambulatory Visit: Payer: Self-pay | Admitting: Family Medicine

## 2017-09-13 ENCOUNTER — Other Ambulatory Visit: Payer: Self-pay | Admitting: Family Medicine

## 2017-10-05 ENCOUNTER — Other Ambulatory Visit: Payer: Self-pay | Admitting: Family Medicine

## 2017-10-05 NOTE — Telephone Encounter (Signed)
Copied from [place] [id number]. Topic: Quick Communication - Rx Refill/Question >> Oct 05, 2017  1:25 PM Oliver Pila B wrote: Medication: amphetamine-dextroamphetamine (ADDERALL) 20 MG tablet [[id number]]  Has the patient contacted their pharmacy? No. (Agent: If no, request that the patient contact the pharmacy for the refill.) [facility] Agent: Please be advised that RX refills may take up to 3 business days. We ask that you follow-up with your pharmacy.

## 2017-10-05 NOTE — Telephone Encounter (Signed)
Rx refill request: Adderall 20 mg   LOV: 08/07/17    PCP: [place]: verified

## 2017-10-09 ENCOUNTER — Encounter: Payer: Self-pay | Admitting: Family Medicine

## 2017-10-09 ENCOUNTER — Ambulatory Visit (INDEPENDENT_AMBULATORY_CARE_PROVIDER_SITE_OTHER): Payer: Managed Care, Other (non HMO) | Admitting: Family Medicine

## 2017-10-09 VITALS — BP 120/84 | HR 95 | Temp 98.6°F | Wt 237.9 lb

## 2017-10-09 DIAGNOSIS — R110 Nausea: Secondary | ICD-10-CM

## 2017-10-09 DIAGNOSIS — F988 Other specified behavioral and emotional disorders with onset usually occurring in childhood and adolescence: Secondary | ICD-10-CM | POA: Diagnosis not present

## 2017-10-09 DIAGNOSIS — R1012 Left upper quadrant pain: Secondary | ICD-10-CM | POA: Diagnosis not present

## 2017-10-09 LAB — CBC WITH DIFFERENTIAL/PLATELET
Basophils Absolute: 0.1 10*3/uL (ref 0.0–0.1)
Basophils Relative: 1.1 % (ref 0.0–3.0)
EOS PCT: 1.8 % (ref 0.0–5.0)
Eosinophils Absolute: 0.2 10*3/uL (ref 0.0–0.7)
HEMATOCRIT: 40.6 % (ref 36.0–46.0)
Hemoglobin: 13.6 g/dL (ref 12.0–15.0)
LYMPHS PCT: 27.1 % (ref 12.0–46.0)
Lymphs Abs: 2.7 10*3/uL (ref 0.7–4.0)
MCHC: 33.4 g/dL (ref 30.0–36.0)
MCV: 81.1 fl (ref 78.0–100.0)
MONOS PCT: 7.6 % (ref 3.0–12.0)
Monocytes Absolute: 0.8 10*3/uL (ref 0.1–1.0)
NEUTROS ABS: 6.2 10*3/uL (ref 1.4–7.7)
Neutrophils Relative %: 62.4 % (ref 43.0–77.0)
Platelets: 357 10*3/uL (ref 150.0–400.0)
RBC: 5 Mil/uL (ref 3.87–5.11)
RDW: 14.2 % (ref 11.5–15.5)
WBC: 10 10*3/uL (ref 4.0–10.5)

## 2017-10-09 LAB — BASIC METABOLIC PANEL
BUN: 13 mg/dL (ref 6–23)
CALCIUM: 9.5 mg/dL (ref 8.4–10.5)
CO2: 30 meq/L (ref 19–32)
CREATININE: 1.02 mg/dL (ref 0.40–1.20)
Chloride: 102 mEq/L (ref 96–112)
GFR: 59.8 mL/min — AB (ref >60.00)
Glucose, Bld: 84 mg/dL (ref 70–99)
Potassium: 4.1 mEq/L (ref 3.5–5.1)
Sodium: 137 mEq/L (ref 135–145)

## 2017-10-09 LAB — LIPASE: Lipase: 41 U/L (ref 11.0–59.0)

## 2017-10-09 LAB — HEPATIC FUNCTION PANEL
ALT: 25 U/L (ref 0–35)
AST: 18 U/L (ref 0–37)
Albumin: 3.9 g/dL (ref 3.5–5.2)
Alkaline Phosphatase: 117 U/L (ref 39–117)
BILIRUBIN DIRECT: 0.1 mg/dL (ref 0.0–0.3)
TOTAL PROTEIN: 7.1 g/dL (ref 6.0–8.3)
Total Bilirubin: 0.4 mg/dL (ref 0.2–1.2)

## 2017-10-09 MED ORDER — AMPHETAMINE-DEXTROAMPHETAMINE 20 MG PO TABS: 20.0000 mg | ORAL_TABLET | Freq: Two times a day (BID) | ORAL | 0 refills | Status: DC

## 2017-10-09 NOTE — Patient Instructions (Signed)
Nausea, Adult  Nausea is the feeling of an upset stomach or having to vomit. Nausea on its own is not usually a serious concern, but it may be an early sign of a more serious medical problem. As nausea gets worse, it can lead to vomiting. If vomiting develops, or if you are not able to drink enough fluids, you are at risk of becoming dehydrated. Dehydration can make you tired and thirsty, cause you to have a dry mouth, and decrease how often you urinate. Older adults and people with other diseases or a weak immune system are at higher risk for dehydration. The main goals of treating your nausea are:   To limit repeated nausea episodes.   To prevent vomiting and dehydration.    Follow these instructions at home:  Follow instructions from your health care provider about how to care for yourself at home.  Eating and drinking  Follow these recommendations as told by your health care provider:   Take an oral rehydration solution (ORS). This is a drink that is sold at pharmacies and retail stores.   Drink clear fluids in small amounts as you are able. Clear fluids include water, ice chips, diluted fruit juice, and low-calorie sports drinks.   Eat bland, easy-to-digest foods in small amounts as you are able. These foods include bananas, applesauce, rice, lean meats, toast, and crackers.   Avoid drinking fluids that contain a lot of sugar or caffeine, such as energy drinks, sports drinks, and soda.   Avoid alcohol.   Avoid spicy or fatty foods.    General instructions   Drink enough fluid to keep your urine clear or pale yellow.   Wash your hands often. If soap and water are not available, use hand sanitizer.   Make sure that all people in your household wash their hands well and often.   Rest at home while you recover.   Take over-the-counter and prescription medicines only as told by your health care provider.   Breathe slowly and deeply when you feel nauseous.   Watch your condition for any  changes.   Keep all follow-up visits as told by your health care provider. This is important.  Contact a health care provider if:   You have a headache.   You have new symptoms.   Your nausea gets worse.   You have a fever.   You feel light-headed or dizzy.   You vomit.   You cannot keep fluids down.  Get help right away if:   You have pain in your chest, neck, arm, or jaw.   You feel extremely weak or you faint.   You have vomit that is bright red or looks like coffee grounds.   You have bloody or black stools or stools that look like tar.   You have a severe headache, a stiff neck, or both.   You have severe pain, cramping, or bloating in your abdomen.   You have a rash.   You have difficulty breathing or are breathing very quickly.   Your heart is beating very quickly.   Your skin feels cold and clammy.   You feel confused.   You have pain when you urinate.   You have signs of dehydration, such as:  ? Dark urine, very little, or no urine.  ? Cracked lips.  ? Dry mouth.  ? Sunken eyes.  ? Sleepiness.  ? Weakness.  These symptoms may represent a serious problem that is an emergency. Do not wait   to see if the symptoms will go away. Get medical help right away. Call your local emergency services (911 in the [place] Do not drive yourself to the hospital.  This information is not intended to replace advice given to you by your health care provider. Make sure you discuss any questions you have with your health care provider.  Document Released: 07/21/2004 Document Revised: 11/16/2015 Document Reviewed: 02/17/2015  Elsevier Interactive Patient Education  2018 Elsevier Inc[employer]

## 2017-10-09 NOTE — Telephone Encounter (Signed)
Last refill 07/14/17.  Okay to fill?

## 2017-10-09 NOTE — Progress Notes (Signed)
Subjective:     Patient ID: [name] Sherman, female   DOB: August 10, 1960, 57 y.o.   MRN: [id number]  HPI Patient is here for several following issues:  She relates three-week history of some low-grade nausea. Had only one episode of vomiting this past weekend. Occasional pain left upper quadrant but somewhat inconsistent. Denies any recent melena. No recent nonsteroidal use. No dysuria. No fevers or chills. No appetite or weight changes. She notes that her nausea is actually somewhat improved after eating. No clear exacerbating factors. No recent reflux symptoms. She had abdominal ultrasound in September which showed no gallstones or other abnormalities. No right upper quadrant pain.  Chronic problems include migraine headaches, hypertension, obstructive sleep apnea, hypothyroidism, ADD  Requesting refills of Adderall. No recent insomnia. No consistent headaches.  Past Medical History:  Diagnosis Date  . ADD (attention deficit disorder)   . Anemia   . Arthritis    shoulder, right (arthritis, tendonitis, bursitis)  . Cancer ([place])    cervical cancer (conization)   . History of cervical cancer    s/p  laser ablation of cervix 1980's  . History of kidney stones   . Hyperlipidemia   . Hypertension   . Hypothyroidism   . IBS (irritable bowel syndrome)   . Irregular heart rate    as a child, has never had any problems   . Left ureteral stone   . Migraine   . OSA (obstructive sleep apnea)    pt used cpap up until 2013 states lost wt and did not need anymore  . Restless legs   . Umbilical hernia without obstruction or gangrene   . Urgency of urination   . Vitamin D deficiency    Past Surgical History:  Procedure Laterality Date  . ABDOMINAL HYSTERECTOMY    . BUNIONECTOMY Right 2004  . COLONOSCOPY    . CYSTOSCOPY W/ RETROGRADES Bilateral 05/26/2015   Procedure: CYSTOSCOPY WITH RETROGRADE PYELOGRAM;  Surgeon: Festus Aloe, MD;  Location: [facility];  Service:  Urology;  Laterality: Bilateral;  . CYSTOSCOPY W/ URETERAL STENT REMOVAL Left 05/26/2015   Procedure: CYSTOSCOPY WITH STENT REMOVAL;  Surgeon: Festus Aloe, MD;  Location: [place];  Service: Urology;  Laterality: Left;  . CYSTOSCOPY WITH RETROGRADE PYELOGRAM, URETEROSCOPY AND STENT PLACEMENT Left 05/01/2015   Procedure: CYSTOSCOPY WITH RETROGRADE PYELOGRAM AND STENT PLACEMENT;  Surgeon: Festus Aloe, MD;  [facility];  Service: Urology;  Laterality: Left;  . CYSTOSCOPY WITH STENT PLACEMENT Bilateral 05/26/2015   Procedure: CYSTOSCOPY WITH STENT PLACEMENT;  Surgeon: Festus Aloe, MD;  Location: [facility];  Service: Urology;  Laterality: Bilateral;  . CYSTOSCOPY WITH URETEROSCOPY Bilateral 05/26/2015   Procedure: CYSTOSCOPY WITH URETEROSCOPY;  Surgeon: Festus Aloe, MD;  Location: [facility];  Service: Urology;  Laterality: Bilateral;  . CYSTOSCOPY/URETEROSCOPY/HOLMIUM LASER/STENT PLACEMENT Right 06/09/2015   Procedure: CYSTOSCOPY/URETEROSCOPY/STENT PLACEMENT REMOVAL LEFT URETERAL STENT;  Surgeon: Festus Aloe, MD;  [facility];  Service: Urology;  Laterality: Right;  . HOLMIUM LASER APPLICATION Left 44/31/5400   Procedure: HOLMIUM LASER APPLICATION;  Surgeon: Festus Aloe, MD;  Location: [facility];  Service: Urology;  Laterality: Left;  . HOLMIUM LASER APPLICATION Right 86/76/1950   Procedure: HOLMIUM LASER APPLICATION;  Surgeon: Festus Aloe, MD;  [facility];  Service: Urology;  Laterality: Right;  . INSERTION OF MESH N/A 09/05/2016   Procedure: INSERTION OF MESH;  Surgeon: [name], MD;  Location: [place];  Service: General;  Laterality: N/A;  .  LAPAROSCOPIC ASSISTED VAGINAL HYSTERECTOMY  04-23-2002   and Left Salpingoophorectomy/  Anterior Repair/  Tension Free Tape Sling placement  . LASER ABLATION OF THE CERVIX  x2  1980's  . TUBAL LIGATION  1980's  . VENTRAL HERNIA REPAIR N/A  09/05/2016   Procedure: LAPAROSCOPIC VENTRAL HERNIA;  Surgeon: [name], MD;  Location: [place];  Service: General;  Laterality: N/A;    reports that she has never smoked. She has never used smokeless tobacco. She reports that she does not drink alcohol or use drugs. family history includes Alcohol abuse in her mother; Arthritis in her father; Cancer in her maternal grandmother and paternal grandmother; Heart disease in her father; Hyperlipidemia in her father; Hypertension in her father; Kidney disease in her paternal uncle; Lung cancer in her mother. Allergies  Allergen Reactions  . Keflex [Cephalexin] Swelling    Tongue swelling      Review of Systems  Constitutional: Negative for chills and fever.  HENT: Negative for congestion.   Respiratory: Negative for cough and shortness of breath.   Gastrointestinal: Positive for abdominal pain and nausea. Negative for abdominal distention, blood in stool, constipation, diarrhea, rectal pain and vomiting.  Genitourinary: Negative for dysuria.       Objective:   Physical Exam  Constitutional: She appears well-developed and well-nourished.  Cardiovascular: Normal rate and regular rhythm.  Pulmonary/Chest: Effort normal and breath sounds normal. No respiratory distress. She has no wheezes. She has no rales.  Abdominal: Soft. Bowel sounds are normal. She exhibits no distension. There is no rebound and no guarding.  Minimal tenderness left upper quadrant to deep palpation. No guarding or rebound. No right upper quadrant or epigastric tenderness       Assessment:     #1  3 week history of persistent low-grade nausea. Occasional left upper quadrant abdominal pain but not clear if these are related.  Nonacute abdominal exam.  No history of known gallstones and symptoms do not suggest likely pancreatitis or gallbladder related etiology  #2 attention deficit disorder-stable.    Plan:     -Check further labs with chemistries, CBC,  lipase -Refill Adderall for 3 months -Follow-up immediately for any vomiting, fever, early progressive abdominal pain or other new symptoms  [name] MD [place] [facility] at [facility]

## 2017-10-09 NOTE — Telephone Encounter (Signed)
This was done during her office visit today.

## 2017-11-08 ENCOUNTER — Other Ambulatory Visit: Payer: Self-pay | Admitting: Family Medicine

## 2017-12-06 ENCOUNTER — Other Ambulatory Visit: Payer: Self-pay | Admitting: Family Medicine

## 2017-12-10 ENCOUNTER — Other Ambulatory Visit: Payer: Self-pay | Admitting: Family Medicine

## 2017-12-15 ENCOUNTER — Ambulatory Visit (INDEPENDENT_AMBULATORY_CARE_PROVIDER_SITE_OTHER): Payer: Managed Care, Other (non HMO) | Admitting: Family Medicine

## 2017-12-15 ENCOUNTER — Encounter: Payer: Self-pay | Admitting: Family Medicine

## 2017-12-15 VITALS — BP 120/80 | HR 79 | Temp 98.4°F

## 2017-12-15 DIAGNOSIS — J069 Acute upper respiratory infection, unspecified: Secondary | ICD-10-CM | POA: Diagnosis not present

## 2017-12-15 DIAGNOSIS — B9789 Other viral agents as the cause of diseases classified elsewhere: Secondary | ICD-10-CM | POA: Diagnosis not present

## 2017-12-15 DIAGNOSIS — E039 Hypothyroidism, unspecified: Secondary | ICD-10-CM | POA: Diagnosis not present

## 2017-12-15 LAB — TSH: TSH: 2.43 u[IU]/mL (ref 0.35–4.50)

## 2017-12-15 MED ORDER — HYDROCODONE-HOMATROPINE 5-1.5 MG/5ML PO SYRP: 5.0000 mL | ORAL_SOLUTION | Freq: Four times a day (QID) | ORAL | 0 refills | Status: AC | PRN

## 2017-12-15 NOTE — Patient Instructions (Signed)

## 2017-12-15 NOTE — Progress Notes (Signed)
Subjective:     Patient ID: [name] Sherman, female   DOB: 1961/03/09, 57 y.o.   MRN: [id number]  HPI Patient seen with upper respiratory illness. Her husband has been battling similar symptoms for almost a month. She developed symptoms yesterday sore throat, body aches, nasal congestion, cough. No fever. Cough is severe at times - especially at night. No wheezing. Denies any nausea or vomiting.  Patient has hypothyroidism. She is due for follow-up labs. Requesting TSH. Compliant with therapy  Her sister died suddenly last week following sepsis. She has been grieving that loss obviously over the past week and she thinks that may have lowered her immunity.  Past Medical History:  Diagnosis Date  . ADD (attention deficit disorder)   . Anemia   . Arthritis    shoulder, right (arthritis, tendonitis, bursitis)  . Cancer ([place])    cervical cancer (conization)   . History of cervical cancer    s/p  laser ablation of cervix 1980's  . History of kidney stones   . Hyperlipidemia   . Hypertension   . Hypothyroidism   . IBS (irritable bowel syndrome)   . Irregular heart rate    as a child, has never had any problems   . Left ureteral stone   . Migraine   . OSA (obstructive sleep apnea)    pt used cpap up until 2013 states lost wt and did not need anymore  . Restless legs   . Umbilical hernia without obstruction or gangrene   . Urgency of urination   . Vitamin D deficiency    Past Surgical History:  Procedure Laterality Date  . ABDOMINAL HYSTERECTOMY    . BUNIONECTOMY Right 2004  . COLONOSCOPY    . CYSTOSCOPY W/ RETROGRADES Bilateral 05/26/2015   Procedure: CYSTOSCOPY WITH RETROGRADE PYELOGRAM;  Surgeon: Festus Aloe, MD;  Location: [facility];  Service: Urology;  Laterality: Bilateral;  . CYSTOSCOPY W/ URETERAL STENT REMOVAL Left 05/26/2015   Procedure: CYSTOSCOPY WITH STENT REMOVAL;  Surgeon: Festus Aloe, MD;  Location[facility];  Service:  Urology;  Laterality: Left;  . CYSTOSCOPY WITH RETROGRADE PYELOGRAM, URETEROSCOPY AND STENT PLACEMENT Left 05/01/2015   Procedure: CYSTOSCOPY WITH RETROGRADE PYELOGRAM AND STENT PLACEMENT;  Surgeon: Festus Aloe, MD;  [facility];  Service: Urology;  Laterality: Left;  . CYSTOSCOPY WITH STENT PLACEMENT Bilateral 05/26/2015   Procedure: CYSTOSCOPY WITH STENT PLACEMENT;  Surgeon: Festus Aloe, MD;  Location: [facility];  Service: Urology;  Laterality: Bilateral;  . CYSTOSCOPY WITH URETEROSCOPY Bilateral 05/26/2015   Procedure: CYSTOSCOPY WITH URETEROSCOPY;  Surgeon: Festus Aloe, MD;  Location: [facility];  Service: Urology;  Laterality: Bilateral;  . CYSTOSCOPY/URETEROSCOPY/HOLMIUM LASER/STENT PLACEMENT Right 06/09/2015   Procedure: CYSTOSCOPY/URETEROSCOPY/STENT PLACEMENT REMOVAL LEFT URETERAL STENT;  Surgeon: Festus Aloe, MD;  [facility];  Service: Urology;  Laterality: Right;  . HOLMIUM LASER APPLICATION Left 02/40/9735   Procedure: HOLMIUM LASER APPLICATION;  Surgeon: Festus Aloe, MD;  Location: [facility];  Service: Urology;  Laterality: Left;  . HOLMIUM LASER APPLICATION Right 32/99/2426   Procedure: HOLMIUM LASER APPLICATION;  Surgeon: Festus Aloe, MD;  [facility];  Service: Urology;  Laterality: Right;  . INSERTION OF MESH N/A 09/05/2016   Procedure: INSERTION OF MESH;  Surgeon: [name], MD;  Location: [place];  Service: General;  Laterality: N/A;  . LAPAROSCOPIC ASSISTED VAGINAL HYSTERECTOMY  04-23-2002   and Left Salpingoophorectomy/  Anterior Repair/  Tension Free Tape Sling placement  .  LASER ABLATION OF THE CERVIX  x2  1980's  . TUBAL LIGATION  1980's  . VENTRAL HERNIA REPAIR N/A 09/05/2016   Procedure: LAPAROSCOPIC VENTRAL HERNIA;  Surgeon: [name], MD;  Location: [place];  Service: General;  Laterality: N/A;    reports that she has never smoked. She has never used smokeless  tobacco. She reports that she does not drink alcohol or use drugs. family history includes Alcohol abuse in her mother; Arthritis in her father; Cancer in her maternal grandmother and paternal grandmother; Heart disease in her father; Hyperlipidemia in her father; Hypertension in her father; Kidney disease in her paternal uncle; Lung cancer in her mother. Allergies  Allergen Reactions  . Keflex [Cephalexin] Swelling    Tongue swelling     Review of Systems  Constitutional: Positive for fatigue. Negative for fever.  HENT: Positive for congestion and sore throat.   Respiratory: Positive for cough.   Cardiovascular: Negative for chest pain.       Objective:   Physical Exam  Constitutional: She appears well-developed and well-nourished.  HENT:  Right Ear: Tympanic membrane normal.  Left Ear: Tympanic membrane normal.  Mouth/Throat: Oropharynx is clear and moist and mucous membranes are normal.  Neck: Normal range of motion. Neck supple.  Cardiovascular: Normal rate.  Pulmonary/Chest: Effort normal and breath sounds normal. No respiratory distress. She has no wheezes. She has no rales.  Lymphadenopathy:    She has no cervical adenopathy.       Assessment:     #1 viral URI with cough  #2 hypothyroidism    Plan:     -Recommend plenty of fluids and rest -Hycodan cough syrup 1 teaspoon daily at bedtime when necessary for severe cough #120 mL -Recheck TSH -Follow-up promptly for fever or worsening symptoms  [name] MD [place] [facility] at [facility]

## 2017-12-26 ENCOUNTER — Telehealth: Payer: Self-pay | Admitting: Family Medicine

## 2017-12-26 NOTE — Telephone Encounter (Signed)
Copied from [place] [id number]. Topic: Quick Communication - See Telephone Encounter >> Dec 26, 2017  9:51 AM Burchel, Abbi R wrote: See Telephone encounter for: 12/26/17.  Pt's husband states pt has some "spots" that she would like to have checked by a dermatologist.  They are requesting a referral from Dr Elease Hashimoto.    Please notify pt: [phone]

## 2017-12-26 NOTE — Telephone Encounter (Signed)
OK to refer- but can offer follow up here first- if she would like for [place] to assess.

## 2017-12-27 NOTE — Telephone Encounter (Signed)
Left a message asking patient to call back and schedule follow up.

## 2018-01-01 NOTE — Telephone Encounter (Signed)
Patient will not need a referral per insurance.  Patient will call and make her own appointment.

## 2018-01-12 ENCOUNTER — Other Ambulatory Visit: Payer: Self-pay | Admitting: *Deleted

## 2018-01-12 ENCOUNTER — Other Ambulatory Visit: Payer: Self-pay | Admitting: Family Medicine

## 2018-01-12 MED ORDER — CITALOPRAM HYDROBROMIDE 20 MG PO TABS: ORAL_TABLET | ORAL | 0 refills | Status: DC

## 2018-01-12 NOTE — Telephone Encounter (Signed)
Copied from [place] [id number]. Topic: Quick Communication - Rx Refill/Question >> Jan 12, 2018 11:33 AM Oliver Pila B wrote: Medication: amphetamine-dextroamphetamine (ADDERALL) 20 MG tablet [[id number]]   Has the patient contacted their pharmacy? Yes.   (Agent: If no, request that the patient contact the pharmacy for the refill.) (Agent: If yes, when and what did the pharmacy advise?)  [facility] ([facility]  Agent: Please be advised that RX refills may take up to 3 business days. We ask that you follow-up with your pharmacy.

## 2018-01-15 NOTE — Telephone Encounter (Signed)
Sent to PCP to advise 

## 2018-01-15 NOTE — Telephone Encounter (Signed)
Relation to pt: self  Call back number:[phone] Pharmacy: [facility] [place], [place]  Reason for call:  Patient checking on status of amphetamine-dextroamphetamine (ADDERALL) 20 MG tablet refill, stating she contacted the pharmacy on Wednesday and she's completely out. Patient states in the future she would like to pick up hard copy (3 month supply), please advise on the status

## 2018-01-15 NOTE — Telephone Encounter (Signed)
Refill of adderall  LOV 12/15/17 Dr. Elease Hashimoto  LRF 10/09/17  #60  0 refills  [id number] - [place], [place] AT [address]

## 2018-01-16 MED ORDER — AMPHETAMINE-DEXTROAMPHETAMINE 20 MG PO TABS: 20.0000 mg | ORAL_TABLET | Freq: Two times a day (BID) | ORAL | 0 refills | Status: DC

## 2018-01-16 NOTE — Telephone Encounter (Signed)
Refills sent electronically

## 2018-01-17 ENCOUNTER — Other Ambulatory Visit: Payer: Self-pay | Admitting: Family Medicine

## 2018-02-12 ENCOUNTER — Telehealth: Payer: Self-pay | Admitting: Family Medicine

## 2018-02-12 ENCOUNTER — Ambulatory Visit: Payer: Self-pay

## 2018-02-12 MED ORDER — AMPHETAMINE-DEXTROAMPHETAMINE 20 MG PO TABS: 20.0000 mg | ORAL_TABLET | Freq: Two times a day (BID) | ORAL | 0 refills | Status: DC

## 2018-02-12 NOTE — Telephone Encounter (Signed)
Left message on machine for patient to return our call.  CRM created 

## 2018-02-12 NOTE — Telephone Encounter (Signed)
Can we see why she is requesting early refill?  Travel ? Etc.

## 2018-02-12 NOTE — Telephone Encounter (Signed)
Refill sent for 3 months 

## 2018-02-12 NOTE — Telephone Encounter (Signed)
  Incoming call from patient stating that Pharmacy ( [place] [facility] [place]) "messed up her Rx of Adderall."  Patient states that she request her prescription of Adderall every month on the 19th.  Patient has returned to school  and states "she really need her Medication." needs the medication bad." States " to be refilled soon as possible" [facility]." Patient is requesting her medication . Reason for Disposition . Health Information question, no triage required and triager able to answer question  Answer Assessment - Initial Assessment Questions 1. REASON FOR CALL or QUESTION: "What is your reason for calling today?" or "How can I best help you?" or "What question do you have that I can help answer?"      Medication.  Protocols used: INFORMATION ONLY CALL-A-AH

## 2018-02-12 NOTE — Telephone Encounter (Signed)
  Alan Ripper, RN  Registered Nurse    Telephone Encounter  Signed  Creation Time:  02/12/2018 3:53 PM          Signed         Incoming call from patient stating that Pharmacy ( [place] [facility] [place]) "messed up her Rx of Adderall."  Patient states that she request her prescription of Adderall every month on the 19th.  Patient has returned to school  and states "she really need her Medication." needs the medication bad." States " to be refilled soon as possible" [facility]." Patient is requesting her medication . Reason for Disposition . Health Information question, no triage required and triager able to answer question  Answer Assessment - Initial Assessment Questions 1. REASON FOR CALL or QUESTION: "What is your reason for calling today?" or "How can I best help you?" or "What question do you have that I can help answer?"      Medication.  Protocols used: INFORMATION ONLY CALL-A-AH

## 2018-02-12 NOTE — Telephone Encounter (Signed)
Pharm called to request authorization She says the reason the pt is asking for early refill is: She is asking to have this filled early, pt says that she is out. She says she went without in June and July and that is why she is out

## 2018-02-12 NOTE — Telephone Encounter (Signed)
Duplicate note. Information copied into telephone encounter already open on this issue.

## 2018-02-12 NOTE — Telephone Encounter (Signed)
Copied from [place] [id number]. Topic: Quick Communication - Rx Refill/Question >> Feb 12, 2018  9:08 AM Margot Ables wrote: Medication: pt is requesting refill on adderall for today - per [facility] pt last fill 01/17/18 for #60 Next RX states to fill when due. Typically that would be 30 days so should fill 8/23. Please call to authorize early dispense.  Preferred Pharmacy (with phone number or street name): [facility] [place], [place] [phone] (Phone) [phone] (Fax)

## 2018-02-13 ENCOUNTER — Other Ambulatory Visit: Payer: Self-pay | Admitting: Family Medicine

## 2018-03-13 ENCOUNTER — Other Ambulatory Visit: Payer: Self-pay | Admitting: Family Medicine

## 2018-03-28 ENCOUNTER — Ambulatory Visit: Payer: Managed Care, Other (non HMO) | Admitting: Family Medicine

## 2018-03-28 DIAGNOSIS — Z0289 Encounter for other administrative examinations: Secondary | ICD-10-CM

## 2018-04-02 ENCOUNTER — Ambulatory Visit: Payer: Managed Care, Other (non HMO) | Admitting: Family Medicine

## 2018-04-02 DIAGNOSIS — Z0289 Encounter for other administrative examinations: Secondary | ICD-10-CM

## 2018-04-09 ENCOUNTER — Other Ambulatory Visit: Payer: Self-pay | Admitting: Family Medicine

## 2018-04-09 NOTE — Telephone Encounter (Signed)
Copied from [place] [id number]. Topic: Quick Communication - Rx Refill/Question >> Apr 09, 2018  3:05 PM Sheran Luz wrote: Medication: amphetamine-dextroamphetamine (ADDERALL) 20 MG tablet  Pt is requesting a refill of this medication-stating she is not due for refill until 10/19-she would just like to request it now and pick up RX on 19th.   Has the patient contacted their pharmacy? Yes. Pt was advised to contact office. Pt is requesting a written RX rather than sending it into pharmacy, if possible.

## 2018-04-10 ENCOUNTER — Other Ambulatory Visit: Payer: Self-pay | Admitting: Family Medicine

## 2018-04-10 MED ORDER — AMPHETAMINE-DEXTROAMPHETAMINE 20 MG PO TABS: 20.0000 mg | ORAL_TABLET | Freq: Two times a day (BID) | ORAL | 0 refills | Status: DC

## 2018-04-10 NOTE — Telephone Encounter (Signed)
Last OV 12/15/17, Next OV 04/16/18  Last filled 02/12/18, # 60 with 2 refills

## 2018-04-10 NOTE — Telephone Encounter (Signed)
Requested medication (s) are due for refill today: yes  Requested medication (s) are on the active medication list: yes  Last refill:  03/10/18  Future visit scheduled: yes  Notes to clinic:  undelegated    Requested Prescriptions  Pending Prescriptions Disp Refills   amphetamine-dextroamphetamine (ADDERALL) 20 MG tablet 60 tablet 0    Sig: Take 1 tablet (20 mg total) by mouth 2 (two) times daily. Fill in one month     Not Delegated - Psychiatry:  Stimulants/ADHD Failed - 04/09/2018  4:04 PM      Failed - This refill cannot be delegated      Failed - Urine Drug Screen completed in last 360 days.      Failed - Valid encounter within last 3 months    Recent Outpatient Visits          3 months ago Viral URI with cough   [place] [facility] [place], [place]   6 months ago Nausea   [facility], Alinda Sierras, MD   8 months ago [place] [facility], Alinda Sierras, MD   9 months ago Essential hypertension   [facility], Doretha Sou, MD   1 year ago Abdominal pain, epigastric   [facility] [place], MD      Future Appointments            In 6 days [place], Alinda Sierras, MD [place] [facility] [place], [facility]

## 2018-04-16 ENCOUNTER — Encounter: Payer: Self-pay | Admitting: Family Medicine

## 2018-04-16 ENCOUNTER — Ambulatory Visit (INDEPENDENT_AMBULATORY_CARE_PROVIDER_SITE_OTHER): Payer: Managed Care, Other (non HMO) | Admitting: Family Medicine

## 2018-04-16 ENCOUNTER — Other Ambulatory Visit: Payer: Self-pay

## 2018-04-16 VITALS — BP 122/84 | HR 87 | Temp 98.3°F | Wt 252.2 lb

## 2018-04-16 DIAGNOSIS — R1030 Lower abdominal pain, unspecified: Secondary | ICD-10-CM | POA: Diagnosis not present

## 2018-04-16 DIAGNOSIS — Z23 Encounter for immunization: Secondary | ICD-10-CM

## 2018-04-16 LAB — POCT URINALYSIS DIPSTICK
Bilirubin, UA: NEGATIVE
Blood, UA: NEGATIVE
Glucose, UA: NEGATIVE
KETONES UA: NEGATIVE
Leukocytes, UA: NEGATIVE
NITRITE UA: NEGATIVE
PROTEIN UA: NEGATIVE
Spec Grav, UA: 1.02 (ref 1.010–1.025)
Urobilinogen, UA: 0.2 E.U./dL
pH, UA: 6 (ref 5.0–8.0)

## 2018-04-16 LAB — CBC WITH DIFFERENTIAL/PLATELET
BASOS ABS: 0.1 10*3/uL (ref 0.0–0.1)
Basophils Relative: 1 % (ref 0.0–3.0)
EOS ABS: 0.2 10*3/uL (ref 0.0–0.7)
Eosinophils Relative: 2.1 % (ref 0.0–5.0)
HEMATOCRIT: 38 % (ref 36.0–46.0)
Hemoglobin: 12.8 g/dL (ref 12.0–15.0)
LYMPHS ABS: 2.1 10*3/uL (ref 0.7–4.0)
LYMPHS PCT: 22.1 % (ref 12.0–46.0)
MCHC: 33.7 g/dL (ref 30.0–36.0)
MCV: 81.8 fl (ref 78.0–100.0)
Monocytes Absolute: 0.5 10*3/uL (ref 0.1–1.0)
Monocytes Relative: 5.7 % (ref 3.0–12.0)
NEUTROS ABS: 6.6 10*3/uL (ref 1.4–7.7)
NEUTROS PCT: 69.1 % (ref 43.0–77.0)
PLATELETS: 345 10*3/uL (ref 150.0–400.0)
RBC: 4.64 Mil/uL (ref 3.87–5.11)
RDW: 14.2 % (ref 11.5–15.5)
WBC: 9.5 10*3/uL (ref 4.0–10.5)

## 2018-04-16 NOTE — Patient Instructions (Signed)
Abdominal Pain, Adult Abdominal pain can be caused by many things. Often, abdominal pain is not serious and it gets better with no treatment or by being treated at home. However, sometimes abdominal pain is serious. Your health care provider will do a medical history and a physical exam to try to determine the cause of your abdominal pain. Follow these instructions at home:  Take over-the-counter and prescription medicines only as told by your health care provider. Do not take a laxative unless told by your health care provider.  Drink enough fluid to keep your urine clear or pale yellow.  Watch your condition for any changes.  Keep all follow-up visits as told by your health care provider. This is important. Contact a health care provider if:  Your abdominal pain changes or gets worse.  You are not hungry or you lose weight without trying.  You are constipated or have diarrhea for more than 2-3 days.  You have pain when you urinate or have a bowel movement.  Your abdominal pain wakes you up at night.  Your pain gets worse with meals, after eating, or with certain foods.  You are throwing up and cannot keep anything down.  You have a fever. Get help right away if:  Your pain does not go away as soon as your health care provider told you to expect.  You cannot stop throwing up.  Your pain is only in areas of the abdomen, such as the right side or the left lower portion of the abdomen.  You have bloody or black stools, or stools that look like tar.  You have severe pain, cramping, or bloating in your abdomen.  You have signs of dehydration, such as: ? Dark urine, very little urine, or no urine. ? Cracked lips. ? Dry mouth. ? Sunken eyes. ? Sleepiness. ? Weakness. This information is not intended to replace advice given to you by your health care provider. Make sure you discuss any questions you have with your health care provider. Document Released: 03/23/2005 Document  Revised: 01/01/2016 Document Reviewed: 11/25/2015 Elsevier Interactive Patient Education  2018 [employer].  We are setting up CT abdomen and pelvis to further assess.    Try to confirm date of last colonoscopy.

## 2018-04-16 NOTE — Progress Notes (Signed)
Subjective:     Patient ID: [name] Sherman, female   DOB: 12-09-1960, 57 y.o.   MRN: [id number]  HPI Patient seen with some persistent nausea.  She had onset around end of March last year.  Refer to note from 10/09/2017.  We obtained several labs at that point which were unremarkable.  At that point she was not really having any abdominal pain-other than very mild left upper quadrant pain.  No weight loss.  She has actually gained some weight from 237 pounds back in April 2019 to current weight of 252 pounds.  Patient describes fairly diffuse lower to mid abdominal pain which is somewhat bilateral and very poorly localized without radiation.  Denies any right upper quadrant pain.  She had limited ultrasound of the abdomen to the right upper quadrant back in September 2018 with no gallstones.  Did have evidence for steatosis  Denies any recent fevers or chills.  Denies any burning with urination.  No stool changes.  Patient thinks she had colonoscopy 2017 with [facility] but is not sure of dates. No known hx of diverticulitis.  She has been battling with some back difficulties and has been scheduled for L5-S1 discectomy November 1.  Past Medical History:  Diagnosis Date  . ADD (attention deficit disorder)   . Anemia   . Arthritis    shoulder, right (arthritis, tendonitis, bursitis)  . Cancer ([place])    cervical cancer (conization)   . History of cervical cancer    s/p  laser ablation of cervix 1980's  . History of kidney stones   . Hyperlipidemia   . Hypertension   . Hypothyroidism   . IBS (irritable bowel syndrome)   . Irregular heart rate    as a child, has never had any problems   . Left ureteral stone   . Migraine   . OSA (obstructive sleep apnea)    pt used cpap up until 2013 states lost wt and did not need anymore  . Restless legs   . Umbilical hernia without obstruction or gangrene   . Urgency of urination   . Vitamin D deficiency    Past Surgical History:  Procedure  Laterality Date  . ABDOMINAL HYSTERECTOMY    . BUNIONECTOMY Right 2004  . COLONOSCOPY    . CYSTOSCOPY W/ RETROGRADES Bilateral 05/26/2015   Procedure: CYSTOSCOPY WITH RETROGRADE PYELOGRAM;  Surgeon: Festus Aloe, MD;  [facility];  Service: Urology;  Laterality: Bilateral;  . CYSTOSCOPY W/ URETERAL STENT REMOVAL Left 05/26/2015   Procedure: CYSTOSCOPY WITH STENT REMOVAL;  Surgeon: Festus Aloe, MD;  [facility];  Service: Urology;  Laterality: Left;  . CYSTOSCOPY WITH RETROGRADE PYELOGRAM, URETEROSCOPY AND STENT PLACEMENT Left 05/01/2015   Procedure: CYSTOSCOPY WITH RETROGRADE PYELOGRAM AND STENT PLACEMENT;  Surgeon: Festus Aloe, MD;  Location: [facility];  Service: Urology;  Laterality: Left;  . CYSTOSCOPY WITH STENT PLACEMENT Bilateral 05/26/2015   Procedure: CYSTOSCOPY WITH STENT PLACEMENT;  Surgeon: Festus Aloe, MD;  [facility];  Service: Urology;  Laterality: Bilateral;  . CYSTOSCOPY WITH URETEROSCOPY Bilateral 05/26/2015   Procedure: CYSTOSCOPY WITH URETEROSCOPY;  Surgeon: Festus Aloe, MD;  Location: [facility];  Service: Urology;  Laterality: Bilateral;  . CYSTOSCOPY/URETEROSCOPY/HOLMIUM LASER/STENT PLACEMENT Right 06/09/2015   Procedure: CYSTOSCOPY/URETEROSCOPY/STENT PLACEMENT REMOVAL LEFT URETERAL STENT;  Surgeon: Festus Aloe, MD;  Location: [facility];  Service: Urology;  Laterality: Right;  . HOLMIUM LASER APPLICATION Left 81/85/6314   Procedure: HOLMIUM LASER APPLICATION;  Surgeon: Festus Aloe, MD;  Location: [facility];  Service: Urology;  Laterality: Left;  . HOLMIUM LASER APPLICATION Right 19/62/2297   Procedure: HOLMIUM LASER APPLICATION;  Surgeon: Festus Aloe, MD;  Location: [facility];  Service: Urology;  Laterality: Right;  . INSERTION OF MESH N/A 09/05/2016   Procedure: INSERTION OF MESH;  Surgeon: [name], MD;  Location: [place];   Service: General;  Laterality: N/A;  . LAPAROSCOPIC ASSISTED VAGINAL HYSTERECTOMY  04-23-2002   and Left Salpingoophorectomy/  Anterior Repair/  Tension Free Tape Sling placement  . LASER ABLATION OF THE CERVIX  x2  1980's  . TUBAL LIGATION  1980's  . VENTRAL HERNIA REPAIR N/A 09/05/2016   Procedure: LAPAROSCOPIC VENTRAL HERNIA;  Surgeon: [name], MD;  Location: [place];  Service: General;  Laterality: N/A;    reports that she has never smoked. She has never used smokeless tobacco. She reports that she does not drink alcohol or use drugs. family history includes Alcohol abuse in her mother; Arthritis in her father; Cancer in her maternal grandmother and paternal grandmother; Heart disease in her father; Hyperlipidemia in her father; Hypertension in her father; Kidney disease in her paternal uncle; Lung cancer in her mother. Allergies  Allergen Reactions  . Keflex [Cephalexin] Swelling    Tongue swelling     Review of Systems  Constitutional: Negative for chills and fever.  Respiratory: Negative for shortness of breath.   Cardiovascular: Negative for chest pain.  Gastrointestinal: Positive for abdominal pain and nausea. Negative for blood in stool, constipation, diarrhea and vomiting.  Genitourinary: Negative for dysuria and flank pain.  Neurological: Negative for dizziness.       Objective:   Physical Exam  Constitutional: She appears well-developed and well-nourished.  Cardiovascular: Normal rate and regular rhythm.  Pulmonary/Chest: Effort normal and breath sounds normal.  Abdominal: Soft. Bowel sounds are normal. She exhibits no distension and no mass. There is no rebound and no guarding.  Mild tenderness to palpation diffusely right and left side mid to lower quadrant  Musculoskeletal: She exhibits no edema.       Assessment:     Presents with several month history of some low-grade nausea without vomiting and diffuse bilateral lower abdominal pains.  She has had  previous hysterectomy but has one ovary left in place which she thinks is the right ovary.  Date of last colonoscopy unknown.  She does not have any red flags such as weight loss.    Plan:     -Check CBC with differential and urinalysis -Consider CT abdomen pelvis to further assess -Asked that she confirm date of last colonoscopy -follow up immediately for any fever or increasing pain.  [name] MD [facility] at [facility]

## 2018-04-20 ENCOUNTER — Telehealth: Payer: Self-pay | Admitting: Family Medicine

## 2018-04-20 NOTE — Telephone Encounter (Signed)
Copied from [place] [id number]. Topic: Quick Communication - See Telephone Encounter >> Apr 20, 2018 12:09 PM Blase Mess A wrote: CRM for notification. See Telephone encounter for: 04/20/18.  Patient is calling to check on the status of the refferal for her CT scan.  She states that she is in a lot of pain.  Please advise.

## 2018-04-24 NOTE — Telephone Encounter (Signed)
Called the pt left a message to return my call . Need to inform the pt that the CT is still in pending  review -Service Order:  [id number] pending authorization  Not has ben approve as of yet / clinicals were fax as well

## 2018-04-25 ENCOUNTER — Inpatient Hospital Stay: Admission: RE | Admit: 2018-04-25 | Payer: Managed Care, Other (non HMO) | Source: Ambulatory Visit

## 2018-04-27 HISTORY — PX: BACK SURGERY: SHX140

## 2018-05-09 ENCOUNTER — Other Ambulatory Visit: Payer: Self-pay | Admitting: Family Medicine

## 2018-05-09 NOTE — Telephone Encounter (Signed)
Pt husband talk with [place] yesterday and per wife we should have the answer in 24 hrs

## 2018-05-31 ENCOUNTER — Telehealth: Payer: Self-pay

## 2018-05-31 NOTE — Telephone Encounter (Signed)
Copied from [place] [id number]. Topic: General - Other >> May 31, 2018  4:08 PM Sheran Luz wrote: Reason for CRM: Patient requesting a call back from [facility] regarding a previous conversation about insurance. Please advise.

## 2018-05-31 NOTE — Telephone Encounter (Signed)
Spoke with pt and she advised she was actually calling about CT scan that was ordered in October. She was concerned about why this was denied. She states per [employer] they requested clinical notes and labs and nothing was ever sent to them, so the authorization request was denied. Pt would like an explanation as to why this information was never sent to the insurance company.   Neoma Laming - Please follow up with patient asap.

## 2018-06-04 ENCOUNTER — Ambulatory Visit: Payer: Managed Care, Other (non HMO) | Admitting: Family Medicine

## 2018-06-04 ENCOUNTER — Encounter

## 2018-06-04 DIAGNOSIS — Z0289 Encounter for other administrative examinations: Secondary | ICD-10-CM

## 2018-06-05 NOTE — Telephone Encounter (Signed)
Notes were faxed twice to [employer] fax  [phone]     Reference #-[id number] 04/24/2018 Pending  I will submit  The request again to [employer] and refax

## 2018-06-07 ENCOUNTER — Other Ambulatory Visit: Payer: Self-pay | Admitting: Family Medicine

## 2018-06-07 DIAGNOSIS — Z1231 Encounter for screening mammogram for malignant neoplasm of breast: Secondary | ICD-10-CM

## 2018-06-08 NOTE — Telephone Encounter (Signed)
Caller name: Haskel Khan  Relation to pt: [employer]  Call back number: [phone] fax #  [phone]   Reason for call:  [employer] states fax was not received, informed this would be the 4th time faxing orders, requested an alternate fax, denied having an alternate fax. Please fax again to [phone].

## 2018-06-08 NOTE — Telephone Encounter (Signed)
Faxed the pt notes once again to  The number [phone] I faxed 3x to [employer]

## 2018-06-08 NOTE — Telephone Encounter (Signed)
[employer] insurance called and stated they have not received the order for her to have the CT abdomen/pelvis without contrast yet. The CT is scheduled for 12/16. Please fax too [phone]

## 2018-06-11 NOTE — Telephone Encounter (Signed)
Spoke with the [employer] representative 11:15am -  Received  conformation it went through  re faxed  Notes  again to the number  which was provided by the [employer] agent  provided [phone]

## 2018-06-12 ENCOUNTER — Other Ambulatory Visit: Payer: Self-pay | Admitting: *Deleted

## 2018-06-12 MED ORDER — AMPHETAMINE-DEXTROAMPHETAMINE 20 MG PO TABS: 20.0000 mg | ORAL_TABLET | Freq: Two times a day (BID) | ORAL | 0 refills | Status: DC

## 2018-06-12 NOTE — Telephone Encounter (Signed)
04/10/18 3 prescriptions were sent to the pharmacy for amphetamine-dextroamphetamine (ADDERALL) 20 MG tablet by Dr Sherren Mocha.  Patient was able to pick up 2 of the prescriptions.  It is now time for the third prescription, however Dr Honor Junes DEA is no longer valid.  Could you please send in her last prescription to be filled?

## 2018-06-13 ENCOUNTER — Telehealth: Payer: Self-pay | Admitting: Surgery

## 2018-06-13 NOTE — Telephone Encounter (Signed)
Contacted pt's insurance for peer to peer regarding ordered CT. Per them, the initial order from Dr. Elease Hashimoto (PCP) was approved and approval faxed to his office on 06/12/19. Per reviewer, exam is already scheduled at [facility] for 3:50pm on 06/16/19. Authorization number is:   [id number] Will contact PCP office to confirm receipt and will contact patient to inform of above.

## 2018-06-27 DIAGNOSIS — R0600 Dyspnea, unspecified: Secondary | ICD-10-CM

## 2018-06-27 HISTORY — DX: Dyspnea, unspecified: R06.00

## 2018-07-02 ENCOUNTER — Other Ambulatory Visit: Payer: Self-pay

## 2018-07-02 ENCOUNTER — Ambulatory Visit (INDEPENDENT_AMBULATORY_CARE_PROVIDER_SITE_OTHER): Payer: Managed Care, Other (non HMO) | Admitting: Family Medicine

## 2018-07-02 ENCOUNTER — Encounter: Payer: Self-pay | Admitting: Family Medicine

## 2018-07-02 VITALS — BP 130/78 | HR 91 | Temp 97.9°F | Wt 258.6 lb

## 2018-07-02 DIAGNOSIS — R110 Nausea: Secondary | ICD-10-CM

## 2018-07-02 DIAGNOSIS — R1012 Left upper quadrant pain: Secondary | ICD-10-CM

## 2018-07-02 DIAGNOSIS — F988 Other specified behavioral and emotional disorders with onset usually occurring in childhood and adolescence: Secondary | ICD-10-CM

## 2018-07-02 DIAGNOSIS — R1011 Right upper quadrant pain: Secondary | ICD-10-CM

## 2018-07-02 DIAGNOSIS — R197 Diarrhea, unspecified: Secondary | ICD-10-CM | POA: Diagnosis not present

## 2018-07-02 DIAGNOSIS — Z800 Family history of malignant neoplasm of digestive organs: Secondary | ICD-10-CM

## 2018-07-02 MED ORDER — AMPHETAMINE-DEXTROAMPHETAMINE 30 MG PO TABS: 30.0000 mg | ORAL_TABLET | Freq: Two times a day (BID) | ORAL | 0 refills | Status: DC

## 2018-07-02 NOTE — Patient Instructions (Signed)
Fatty Liver Disease  Fatty liver disease occurs when too much fat has built up in your liver cells. Fatty liver disease is also called hepatic steatosis or steatohepatitis. The liver removes harmful substances from your bloodstream and produces fluids that your body needs. It also helps your body use and store energy from the food you eat. In many cases, fatty liver disease does not cause symptoms or problems. It is often diagnosed when tests are being done for other reasons. However, over time, fatty liver can cause inflammation that may lead to more serious liver problems, such as scarring of the liver (cirrhosis) and liver failure. Fatty liver is associated with insulin resistance, increased body fat, high blood pressure (hypertension), and high cholesterol. These are features of metabolic syndrome and increase your risk for stroke, diabetes, and heart disease. What are the causes? This condition may be caused by:  Drinking too much alcohol.  Poor nutrition.  Obesity.  Cushing's syndrome.  Diabetes.  High cholesterol.  Certain drugs.  Poisons.  Some viral infections.  Pregnancy. What increases the risk? You are more likely to develop this condition if you:  Abuse alcohol.  Are overweight.  Have diabetes.  Have hepatitis.  Have a high triglyceride level.  Are pregnant. What are the signs or symptoms? Fatty liver disease often does not cause symptoms. If symptoms do develop, they can include:  Fatigue.  Weakness.  Weight loss.  Confusion.  Abdominal pain.  Nausea and vomiting.  Yellowing of your skin and the white parts of your eyes (jaundice).  Itchy skin. How is this diagnosed? This condition may be diagnosed by:  A physical exam and medical history.  Blood tests.  Imaging tests, such as an ultrasound, CT scan, or MRI.  A liver biopsy. A small sample of liver tissue is removed using a needle. The sample is then looked at under a microscope. How  is this treated? Fatty liver disease is often caused by other health conditions. Treatment for fatty liver may involve medicines and lifestyle changes to manage conditions such as:  Alcoholism.  High cholesterol.  Diabetes.  Being overweight or obese. Follow these instructions at home:   Do not drink alcohol. If you have trouble quitting, ask your health care provider how to safely quit with the help of medicine or a supervised program. This is important to keep your condition from getting worse.  Eat a healthy diet as told by your health care provider. Ask your health care provider about working with a diet and nutrition specialist (dietitian) to develop an eating plan.  Exercise regularly. This can help you lose weight and control your cholesterol and diabetes. Talk to your health care provider about an exercise plan and which activities are best for you.  Take over-the-counter and prescription medicines only as told by your health care provider.  Keep all follow-up visits as told by your health care provider. This is important. Contact a health care provider if: You have trouble controlling your:  Blood sugar. This is especially important if you have diabetes.  Cholesterol.  Drinking of alcohol. Get help right away if:  You have abdominal pain.  You have jaundice.  You have nausea and vomiting.  You vomit blood or material that looks like coffee grounds.  You have stools that are black, tar-like, or bloody. Summary  Fatty liver disease develops when too much fat builds up in the cells of your liver.  Fatty liver disease often causes no symptoms or problems. However, over   time, fatty liver can cause inflammation that may lead to more serious liver problems, such as scarring of the liver (cirrhosis).  You are more likely to develop this condition if you abuse alcohol, are pregnant, are overweight, have diabetes, have hepatitis, or have high triglyceride  levels.  Contact your health care provider if you have trouble controlling your weight, blood sugar, cholesterol, or drinking of alcohol. This information is not intended to replace advice given to you by your health care provider. Make sure you discuss any questions you have with your health care provider. Document Released: 07/29/2005 Document Revised: 03/22/2017 Document Reviewed: 03/22/2017 Elsevier Interactive Patient Education  [employer].  We will refer to GI for colonoscopy.

## 2018-07-02 NOTE — Progress Notes (Signed)
Subjective:     Patient ID: [name] Sherman, female   DOB: [birth year], 58 y.o.   MRN: [id number]  HPI Patient seen to discuss recent CT results.  Refer to recent visit from 04/16/2018 for details regarding some chronic GI complaints.  "Patient seen with some persistent nausea.  She had onset around end of March last year.  Refer to note from 10/09/2017.  We obtained several labs at that point which were unremarkable.  At that point she was not really having any abdominal pain-other than very mild left upper quadrant pain.  No weight loss.  She has actually gained some weight from 237 pounds back in April 2019 to current weight of 252 pounds.  Patient describes fairly diffuse lower to mid abdominal pain which is somewhat bilateral and very poorly localized without radiation.  Denies any right upper quadrant pain.  She had limited ultrasound of the abdomen to the right upper quadrant back in September 2018 with no gallstones.  Did have evidence for steatosis  Denies any recent fevers or chills.  Denies any burning with urination.  No stool changes.  Patient thinks she had colonoscopy 2017 with [facility] but is not sure of dates. No known hx of diverticulitis."  She had CT scan over at [facility] which showed some fatty liver changes but otherwise no acute abnormalities.  Nothing to explain her chronic left-sided pain.  Her pain is mostly left flank region but she also has some epigastric pain.  She has some chronic nausea which she states fits no "rhyme or reason".  This is not consistently postprandial.  She is also complaining of frequent loose stools.  She has not correlated this with specific foods.  No history of lactose intolerance.  Family history significant for grandmother and mother having colon cancer history.  Patient is overdue for repeat colonoscopy.  She is willing to get this scheduled.  She has considered gastric bypass surgery.  She has tried multiple plans for weight loss in  the past including weight watchers, keto diet, [place], and has taken Adipex from a bariatric clinic without much success.  Patient has attention deficit disorder.  Currently on Adderall 20 mg twice daily.  She is currently working on a Conservator, museum/gallery.  She states that she still has difficulty focusing.  She is requesting trial of increased dosage of Adderall.  He does have history of elevated blood pressure but this has been well controlled.  Past Medical History:  Diagnosis Date  . ADD (attention deficit disorder)   . Anemia   . Arthritis    shoulder, right (arthritis, tendonitis, bursitis)  . Cancer ([place])    cervical cancer (conization)   . History of cervical cancer    s/p  laser ablation of cervix 1980's  . History of kidney stones   . Hyperlipidemia   . Hypertension   . Hypothyroidism   . IBS (irritable bowel syndrome)   . Irregular heart rate    as a child, has never had any problems   . Left ureteral stone   . Migraine   . OSA (obstructive sleep apnea)    pt used cpap up until 2013 states lost wt and did not need anymore  . Restless legs   . Umbilical hernia without obstruction or gangrene   . Urgency of urination   . Vitamin D deficiency    Past Surgical History:  Procedure Laterality Date  . ABDOMINAL HYSTERECTOMY    . BACK SURGERY  04/27/2018  .  BUNIONECTOMY Right 2004  . COLONOSCOPY    . CYSTOSCOPY W/ RETROGRADES Bilateral 05/26/2015   Procedure: CYSTOSCOPY WITH RETROGRADE PYELOGRAM;  Surgeon: Festus Aloe, MD;  Location: [facility];  Service: Urology;  Laterality: Bilateral;  . CYSTOSCOPY W/ URETERAL STENT REMOVAL Left 05/26/2015   Procedure: CYSTOSCOPY WITH STENT REMOVAL;  Surgeon: Festus Aloe, MD;  Location: [facility];  Service: Urology;  Laterality: Left;  . CYSTOSCOPY WITH RETROGRADE PYELOGRAM, URETEROSCOPY AND STENT PLACEMENT Left 05/01/2015   Procedure: CYSTOSCOPY WITH RETROGRADE PYELOGRAM AND STENT PLACEMENT;   Surgeon: Festus Aloe, MD;  [facility];  Service: Urology;  Laterality: Left;  . CYSTOSCOPY WITH STENT PLACEMENT Bilateral 05/26/2015   Procedure: CYSTOSCOPY WITH STENT PLACEMENT;  Surgeon: Festus Aloe, MD;  Location: [facility];  Service: Urology;  Laterality: Bilateral;  . CYSTOSCOPY WITH URETEROSCOPY Bilateral 05/26/2015   Procedure: CYSTOSCOPY WITH URETEROSCOPY;  Surgeon: Festus Aloe, MD;  Location: [facility];  Service: Urology;  Laterality: Bilateral;  . CYSTOSCOPY/URETEROSCOPY/HOLMIUM LASER/STENT PLACEMENT Right 06/09/2015   Procedure: CYSTOSCOPY/URETEROSCOPY/STENT PLACEMENT REMOVAL LEFT URETERAL STENT;  Surgeon: Festus Aloe, MD;  [facility];  Service: Urology;  Laterality: Right;  . HOLMIUM LASER APPLICATION Left 99/83/3825   Procedure: HOLMIUM LASER APPLICATION;  Surgeon: Festus Aloe, MD;  Location: [facility];  Service: Urology;  Laterality: Left;  . HOLMIUM LASER APPLICATION Right 05/39/7673   Procedure: HOLMIUM LASER APPLICATION;  Surgeon: Festus Aloe, MD;  [facility];  Service: Urology;  Laterality: Right;  . INSERTION OF MESH N/A 09/05/2016   Procedure: INSERTION OF MESH;  Surgeon: [name], MD;  Location: [place];  Service: General;  Laterality: N/A;  . LAPAROSCOPIC ASSISTED VAGINAL HYSTERECTOMY  04-23-2002   and Left Salpingoophorectomy/  Anterior Repair/  Tension Free Tape Sling placement  . LASER ABLATION OF THE CERVIX  x2  1980's  . TUBAL LIGATION  1980's  . VENTRAL HERNIA REPAIR N/A 09/05/2016   Procedure: LAPAROSCOPIC VENTRAL HERNIA;  Surgeon: [name], MD;  Location: [place];  Service: General;  Laterality: N/A;    reports that she has never smoked. She has never used smokeless tobacco. She reports that she does not drink alcohol or use drugs. family history includes Alcohol abuse in her mother; Arthritis in her father; Cancer in her maternal grandmother and paternal  grandmother; Heart disease in her father; Hyperlipidemia in her father; Hypertension in her father; Kidney disease in her paternal uncle; Lung cancer in her mother. Allergies  Allergen Reactions  . Keflex [Cephalexin] Swelling    Tongue swelling       Review of Systems  Constitutional: Negative for chills and fever.  Respiratory: Negative for shortness of breath.   Cardiovascular: Negative for chest pain.  Gastrointestinal: Positive for abdominal pain, diarrhea and nausea. Negative for blood in stool.  Genitourinary: Negative for dysuria.       Objective:   Physical Exam Constitutional:      Appearance: Normal appearance.  Cardiovascular:     Rate and Rhythm: Normal rate and regular rhythm.  Pulmonary:     Effort: Pulmonary effort is normal.     Breath sounds: Normal breath sounds. No wheezing or rales.  Abdominal:     General: There is no distension.     Palpations: Abdomen is soft. There is no mass.     Hernia: No hernia is present.     Comments: She has some diffuse tenderness epigastric and left upper quadrant region.  No  guarding.  No rebound.  No masses palpated.  Neurological:     Mental Status: She is alert.        Assessment:     #1 patient has had some chronic GI complaints including intermittent nausea, frequent dyspepsia, intermittent epigastric and left upper quadrant abdominal pain, and frequent loose stools.  No known history of lactose intolerance or gluten sensitivity  #2 positive family history of colon cancer-mother and grandmother.  Patient overdue for colonoscopy  #3 attention deficit disorder    Plan:     -set up GI referral .  She is overdue for repeat colonoscopy with [facility] colon cancer as above -pt has questions whether she should have EGD as well and will defer to GI. -refill Adderall and increase to 30 mg po bid and will need to monitor BP closely.  [name] MD [facility]

## 2018-07-09 ENCOUNTER — Other Ambulatory Visit: Payer: Self-pay | Admitting: Family Medicine

## 2018-07-12 ENCOUNTER — Encounter: Payer: Self-pay | Admitting: Gastroenterology

## 2018-07-20 ENCOUNTER — Telehealth: Payer: Self-pay | Admitting: Family Medicine

## 2018-07-20 NOTE — Telephone Encounter (Signed)
Patient dropped off forms for Bariatric Surgery Program  Fax forms to: [phone] ATTN: Bariatric  Disposition: Dr's Folder  The patient has an upcoming appointment with [facility] and will pick the forms up then after they are faxed.

## 2018-07-23 NOTE — Telephone Encounter (Signed)
Form placed in red folder on Dr. Erick Blinks desk. Please return when complete. Thank you!

## 2018-07-24 ENCOUNTER — Ambulatory Visit: Payer: Managed Care, Other (non HMO) | Admitting: Family Medicine

## 2018-07-24 ENCOUNTER — Other Ambulatory Visit: Payer: Self-pay

## 2018-07-24 ENCOUNTER — Encounter: Payer: Self-pay | Admitting: Family Medicine

## 2018-07-24 VITALS — BP 118/78 | HR 97 | Temp 98.2°F | Wt 261.3 lb

## 2018-07-24 DIAGNOSIS — J069 Acute upper respiratory infection, unspecified: Secondary | ICD-10-CM

## 2018-07-24 DIAGNOSIS — B9789 Other viral agents as the cause of diseases classified elsewhere: Secondary | ICD-10-CM | POA: Diagnosis not present

## 2018-07-24 MED ORDER — HYDROCODONE-HOMATROPINE 5-1.5 MG/5ML PO SYRP: 5.0000 mL | ORAL_SOLUTION | Freq: Four times a day (QID) | ORAL | 0 refills | Status: AC | PRN

## 2018-07-24 NOTE — Telephone Encounter (Signed)
Completed paperwork has been faxed with fax confirmation.

## 2018-07-24 NOTE — Progress Notes (Signed)
Subjective:     Patient ID: [name] Sherman, female   DOB: 07/01/1960, 58 y.o.   MRN: [id number]  HPI Patient seen as a work and with 1 day history of nasal congestion, headache, facial pressure, sore throat, mild body aches.  Her husband was diagnosed last week with strep pharyngitis.  She has not had any fever.  Her cough is her most severe symptom and was keeping her awake last night.  No relief with over-the-counter medication.  Past Medical History:  Diagnosis Date  . ADD (attention deficit disorder)   . Anemia   . Arthritis    shoulder, right (arthritis, tendonitis, bursitis)  . Cancer ([place])    cervical cancer (conization)   . History of cervical cancer    s/p  laser ablation of cervix 1980's  . History of kidney stones   . Hyperlipidemia   . Hypertension   . Hypothyroidism   . IBS (irritable bowel syndrome)   . Irregular heart rate    as a child, has never had any problems   . Left ureteral stone   . Migraine   . OSA (obstructive sleep apnea)    pt used cpap up until 2013 states lost wt and did not need anymore  . Restless legs   . Umbilical hernia without obstruction or gangrene   . Urgency of urination   . Vitamin D deficiency    Past Surgical History:  Procedure Laterality Date  . ABDOMINAL HYSTERECTOMY    . BACK SURGERY  04/27/2018  . BUNIONECTOMY Right 2004  . COLONOSCOPY    . CYSTOSCOPY W/ RETROGRADES Bilateral 05/26/2015   Procedure: CYSTOSCOPY WITH RETROGRADE PYELOGRAM;  Surgeon: Festus Aloe, MD;  [facility];  Service: Urology;  Laterality: Bilateral;  . CYSTOSCOPY W/ URETERAL STENT REMOVAL Left 05/26/2015   Procedure: CYSTOSCOPY WITH STENT REMOVAL;  Surgeon: Festus Aloe, MD;  Location: [facility];  Service: Urology;  Laterality: Left;  . CYSTOSCOPY WITH RETROGRADE PYELOGRAM, URETEROSCOPY AND STENT PLACEMENT Left 05/01/2015   Procedure: CYSTOSCOPY WITH RETROGRADE PYELOGRAM AND STENT PLACEMENT;  Surgeon:  Festus Aloe, MD;  [facility];  Service: Urology;  Laterality: Left;  . CYSTOSCOPY WITH STENT PLACEMENT Bilateral 05/26/2015   Procedure: CYSTOSCOPY WITH STENT PLACEMENT;  Surgeon: Festus Aloe, MD;  Location: [facility];  Service: Urology;  Laterality: Bilateral;  . CYSTOSCOPY WITH URETEROSCOPY Bilateral 05/26/2015   Procedure: CYSTOSCOPY WITH URETEROSCOPY;  Surgeon: Festus Aloe, MD;  Location: [facility];  Service: Urology;  Laterality: Bilateral;  . CYSTOSCOPY/URETEROSCOPY/HOLMIUM LASER/STENT PLACEMENT Right 06/09/2015   Procedure: CYSTOSCOPY/URETEROSCOPY/STENT PLACEMENT REMOVAL LEFT URETERAL STENT;  Surgeon: Festus Aloe, MD;  [facility];  Service: Urology;  Laterality: Right;  . HOLMIUM LASER APPLICATION Left 70/62/3762   Procedure: HOLMIUM LASER APPLICATION;  Surgeon: Festus Aloe, MD;  Location[facility];  Service: Urology;  Laterality: Left;  . HOLMIUM LASER APPLICATION Right 83/15/1761   Procedure: HOLMIUM LASER APPLICATION;  Surgeon: Festus Aloe, MD;  [facility];  Service: Urology;  Laterality: Right;  . INSERTION OF MESH N/A 09/05/2016   Procedure: INSERTION OF MESH;  Surgeon: [name], MD;  Location: [place];  Service: General;  Laterality: N/A;  . LAPAROSCOPIC ASSISTED VAGINAL HYSTERECTOMY  04-23-2002   and Left Salpingoophorectomy/  Anterior Repair/  Tension Free Tape Sling placement  . LASER ABLATION OF THE CERVIX  x2  1980's  . TUBAL LIGATION  1980's  . VENTRAL HERNIA REPAIR N/A 09/05/2016  Procedure: LAPAROSCOPIC VENTRAL HERNIA;  Surgeon: [name], MD;  Location: [place];  Service: General;  Laterality: N/A;    reports that she has never smoked. She has never used smokeless tobacco. She reports that she does not drink alcohol or use drugs. family history includes Alcohol abuse in her mother; Arthritis in her father; Cancer in her maternal grandmother and paternal grandmother;  Heart disease in her father; Hyperlipidemia in her father; Hypertension in her father; Kidney disease in her paternal uncle; Lung cancer in her mother. Allergies  Allergen Reactions  . Keflex [Cephalexin] Swelling    Tongue swelling     Review of Systems  Constitutional: Positive for fatigue.  HENT: Positive for congestion and sore throat.   Respiratory: Positive for cough. Negative for shortness of breath and wheezing.        Objective:   Physical Exam Constitutional:      General: She is not in acute distress.    Appearance: Normal appearance.  HENT:     Right Ear: Tympanic membrane normal.     Left Ear: Tympanic membrane normal.     Mouth/Throat:     Pharynx: Oropharynx is clear. No oropharyngeal exudate.  Neck:     Musculoskeletal: Neck supple.  Cardiovascular:     Rate and Rhythm: Normal rate and regular rhythm.  Pulmonary:     Effort: Pulmonary effort is normal.     Breath sounds: Normal breath sounds. No wheezing or rales.  Lymphadenopathy:     Cervical: No cervical adenopathy.  Neurological:     Mental Status: She is alert.        Assessment:     Acute upper respiratory infection.  Suspect viral    Plan:     -Hycodan cough syrup 1 teaspoon every 6 hours as needed for severe cough.  She is warned about potential sedation with this. -Follow-up for any persistent or worsening symptoms.  She has follow-up in 1 week and reassess then  [name] MD [facility] at [facility]

## 2018-07-24 NOTE — Telephone Encounter (Signed)
Done

## 2018-07-24 NOTE — Patient Instructions (Signed)
Cough, Adult  Coughing is a reflex that clears your throat and your airways. Coughing helps to heal and protect your lungs. It is normal to cough occasionally, but a cough that happens with other symptoms or lasts a long time may be a sign of a condition that needs treatment. A cough may last only 2-3 weeks (acute), or it may last longer than 8 weeks (chronic). What are the causes? Coughing is commonly caused by:  Breathing in substances that irritate your lungs.  A viral or bacterial respiratory infection.  Allergies.  Asthma.  Postnasal drip.  Smoking.  Acid backing up from the stomach into the esophagus (gastroesophageal reflux).  Certain medicines.  Chronic lung problems, including COPD (or rarely, lung cancer).  Other medical conditions such as heart failure. Follow these instructions at home: Pay attention to any changes in your symptoms. Take these actions to help with your discomfort:  Take medicines only as told by your health care provider. ? If you were prescribed an antibiotic medicine, take it as told by your health care provider. Do not stop taking the antibiotic even if you start to feel better. ? Talk with your health care provider before you take a cough suppressant medicine.  Drink enough fluid to keep your urine clear or pale yellow.  If the air is dry, use a cold steam vaporizer or humidifier in your bedroom or your home to help loosen secretions.  Avoid anything that causes you to cough at work or at home.  If your cough is worse at night, try sleeping in a semi-upright position.  Avoid cigarette smoke. If you smoke, quit smoking. If you need help quitting, ask your health care provider.  Avoid caffeine.  Avoid alcohol.  Rest as needed. Contact a health care provider if:  You have new symptoms.  You cough up pus.  Your cough does not get better after 2-3 weeks, or your cough gets worse.  You cannot control your cough with suppressant  medicines and you are losing sleep.  You develop pain that is getting worse or pain that is not controlled with pain medicines.  You have a fever.  You have unexplained weight loss.  You have night sweats. Get help right away if:  You cough up blood.  You have difficulty breathing.  Your heartbeat is very fast. This information is not intended to replace advice given to you by your health care provider. Make sure you discuss any questions you have with your health care provider. Document Released: 12/10/2010 Document Revised: 11/19/2015 Document Reviewed: 08/20/2014 Elsevier Interactive Patient Education  2019 Elsevier Inc.  

## 2018-07-26 ENCOUNTER — Ambulatory Visit: Payer: Managed Care, Other (non HMO) | Admitting: Gastroenterology

## 2018-07-26 NOTE — Progress Notes (Deleted)
Referring Provider: [name], MD Primary Care Physician:  [name], MD   Reason for Consultation:  ***   IMPRESSION:  ***  PLAN: ***   HPI: [name] Sherman is a 58 y.o. female    Quality: Amount: Duration: Timing: Progression: Chronicity: Context: Similar prior episodes: Relieved by: Worsened by: Effective treatments: Ineffective treatments: Associated symptoms: Risks factors:   Past Medical History:  Diagnosis Date  . ADD (attention deficit disorder)   . Anemia   . Arthritis    shoulder, right (arthritis, tendonitis, bursitis)  . Cancer ([place])    cervical cancer (conization)   . History of cervical cancer    s/p  laser ablation of cervix 1980's  . History of kidney stones   . Hyperlipidemia   . Hypertension   . Hypothyroidism   . IBS (irritable bowel syndrome)   . Irregular heart rate    as a child, has never had any problems   . Left ureteral stone   . Migraine   . OSA (obstructive sleep apnea)    pt used cpap up until 2013 states lost wt and did not need anymore  . Restless legs   . Umbilical hernia without obstruction or gangrene   . Urgency of urination   . Vitamin D deficiency     Past Surgical History:  Procedure Laterality Date  . ABDOMINAL HYSTERECTOMY    . BACK SURGERY  04/27/2018  . BUNIONECTOMY Right 2004  . COLONOSCOPY    . CYSTOSCOPY W/ RETROGRADES Bilateral 05/26/2015   Procedure: CYSTOSCOPY WITH RETROGRADE PYELOGRAM;  Surgeon: Festus Aloe, MD;  Location: [facility];  Service: Urology;  Laterality: Bilateral;  . CYSTOSCOPY W/ URETERAL STENT REMOVAL Left 05/26/2015   Procedure: CYSTOSCOPY WITH STENT REMOVAL;  Surgeon: Festus Aloe, MD;  Location: [facility];  Service: Urology;  Laterality: Left;  . CYSTOSCOPY WITH RETROGRADE PYELOGRAM, URETEROSCOPY AND STENT PLACEMENT Left 05/01/2015   Procedure: CYSTOSCOPY WITH RETROGRADE PYELOGRAM AND STENT PLACEMENT;  Surgeon: Festus Aloe, MD;  [facility];  Service: Urology;  Laterality: Left;  . CYSTOSCOPY WITH STENT PLACEMENT Bilateral 05/26/2015   Procedure: CYSTOSCOPY WITH STENT PLACEMENT;  Surgeon: Festus Aloe, MD;  Location[facility];  Service: Urology;  Laterality: Bilateral;  . CYSTOSCOPY WITH URETEROSCOPY Bilateral 05/26/2015   Procedure: CYSTOSCOPY WITH URETEROSCOPY;  Surgeon: Festus Aloe, MD;  Location: [facility];  Service: Urology;  Laterality: Bilateral;  . CYSTOSCOPY/URETEROSCOPY/HOLMIUM LASER/STENT PLACEMENT Right 06/09/2015   Procedure: CYSTOSCOPY/URETEROSCOPY/STENT PLACEMENT REMOVAL LEFT URETERAL STENT;  Surgeon: Festus Aloe, MD;  [facility];  Service: Urology;  Laterality: Right;  . HOLMIUM LASER APPLICATION Left 25/95/6387   Procedure: HOLMIUM LASER APPLICATION;  Surgeon: Festus Aloe, MD;  Location: [facility];  Service: Urology;  Laterality: Left;  . HOLMIUM LASER APPLICATION Right 56/43/3295   Procedure: HOLMIUM LASER APPLICATION;  Surgeon: Festus Aloe, MD;  [facility];  Service: Urology;  Laterality: Right;  . INSERTION OF MESH N/A 09/05/2016   Procedure: INSERTION OF MESH;  Surgeon: [name], MD;  Location: [place];  Service: General;  Laterality: N/A;  . LAPAROSCOPIC ASSISTED VAGINAL HYSTERECTOMY  04-23-2002   and Left Salpingoophorectomy/  Anterior Repair/  Tension Free Tape Sling placement  . LASER ABLATION OF THE CERVIX  x2  1980's  . TUBAL LIGATION  1980's  . VENTRAL HERNIA REPAIR N/A 09/05/2016   Procedure: LAPAROSCOPIC VENTRAL HERNIA;  Surgeon: [name], MD;  [facility]: [facility];  Service: General;  Laterality: N/A;    Current Outpatient Medications  Medication Sig Dispense Refill  . amphetamine-dextroamphetamine (ADDERALL) 30 MG tablet Take 1 tablet by mouth 2 (two) times daily. 60 tablet 0  . atorvastatin (LIPITOR) 10 MG tablet TAKE 1 TABLET BY MOUTH DAILY 90 tablet 0  . citalopram  (CELEXA) 20 MG tablet TAKE 3 TABLETS BY MOUTH EVERY NIGHT AT BEDTIME OR AS DIRECTED 90 tablet 0  . Estradiol (DIVIGEL) 0.75 MG/0.75GM GEL Place 1 g onto the skin daily.    Marland Kitchen HYDROcodone-homatropine (HYCODAN) 5-1.5 MG/5ML syrup Take 5 mLs by mouth every 6 (six) hours as needed for up to 10 days. 120 mL 0  . levothyroxine (SYNTHROID, LEVOTHROID) 50 MCG tablet TAKE 1 TABLET BY MOUTH DAILY 90 tablet 2  . losartan-hydrochlorothiazide (HYZAAR) 50-12.5 MG tablet TAKE 1 TABLET BY MOUTH DAILY 90 tablet 0  . pantoprazole (PROTONIX) 40 MG tablet TAKE 1 TABLET(40 MG) BY MOUTH DAILY 90 tablet 1  . SUMAtriptan (IMITREX) 100 MG tablet Take 1 tablet (100 mg total) by mouth every 2 (two) hours as needed for migraine. May repeat in 2 hours if headache persists or recurs. 10 tablet 11  . Vitamin D, Ergocalciferol, (DRISDOL) 50000 units CAPS capsule TAKE 1 CAPSULE BY MOUTH 1 TIME A WEEK 12 capsule 3   No current facility-administered medications for this visit.     Allergies as of 07/26/2018 - Review Complete 07/24/2018  Allergen Reaction Noted  . Keflex [cephalexin] Swelling 08/16/2016    Family History  Problem Relation Age of Onset  . Alcohol abuse Mother   . Lung cancer Mother   . Arthritis Father   . Hyperlipidemia Father   . Heart disease Father   . Hypertension Father   . Kidney disease Paternal Uncle   . Cancer Maternal Grandmother        colon  . Cancer Paternal Grandmother        breast    Social History   Socioeconomic History  . Marital status: Legally Separated    Spouse name: Not on file  . Number of children: Not on file  . Years of education: Not on file  . Highest education level: Not on file  Occupational History  . Not on file  Social Needs  . Financial resource strain: Not on file  . Food insecurity:    Worry: Not on file    Inability: Not on file  . Transportation needs:    Medical: Not on file    Non-medical: Not on file  Tobacco Use  . Smoking status: Never  Smoker  . Smokeless tobacco: Never Used  Substance and Sexual Activity  . Alcohol use: No    Comment: 1 x year  . Drug use: No  . Sexual activity: Never  Lifestyle  . Physical activity:    Days per week: Not on file    Minutes per session: Not on file  . Stress: Not on file  Relationships  . Social connections:    Talks on phone: Not on file    Gets together: Not on file    Attends religious service: Not on file    Active member of club or organization: Not on file    Attends meetings of clubs or organizations: Not on file    Relationship status: Not on file  . Intimate partner violence:    Fear of current or ex partner: Not on file    Emotionally abused: Not on file  Physically abused: Not on file    Forced sexual activity: Not on file  Other Topics Concern  . Not on file  Social History Narrative  . Not on file    Review of Systems: 12 system ROS is negative except as noted above.  There were no vitals filed for this visit.  Physical Exam: Vital signs were reviewed. General:   Alert, well-nourished, pleasant and cooperative in NAD Head:  Normocephalic and atraumatic. Eyes:  Sclera clear, no icterus.   Conjunctiva pink. Mouth:  No deformity or lesions.   Neck:  Supple; no thyromegaly. Lungs:  Clear throughout to auscultation.   No wheezes.  Heart:  Regular rate and rhythm; no murmurs Abdomen:  Soft, nontender, normal bowel sounds. No rebound or guarding. No hepatosplenomegaly Rectal:  Deferred  Msk:  Symmetrical without gross deformities. Extremities:  No gross deformities or edema. Neurologic:  Alert and  oriented x4;  grossly nonfocal Skin:  No rash or bruise. Psych:  Alert and cooperative. Normal mood and affect.   Tareq Dwan L. Tarri Glenn, MD, MPH [facility] 07/26/2018, 1:25 PM

## 2018-07-27 ENCOUNTER — Other Ambulatory Visit: Payer: Self-pay | Admitting: Neurological Surgery

## 2018-07-31 ENCOUNTER — Telehealth: Payer: Self-pay | Admitting: Family Medicine

## 2018-07-31 ENCOUNTER — Other Ambulatory Visit: Payer: Self-pay | Admitting: Family Medicine

## 2018-07-31 MED ORDER — AMPHETAMINE-DEXTROAMPHETAMINE 30 MG PO TABS: ORAL_TABLET | ORAL | 0 refills | Status: DC

## 2018-07-31 MED ORDER — AMPHETAMINE-DEXTROAMPHETAMINE 30 MG PO TABS: 30.0000 mg | ORAL_TABLET | Freq: Two times a day (BID) | ORAL | 0 refills | Status: DC

## 2018-07-31 NOTE — Telephone Encounter (Signed)
Refills of Adderall given for 3 months.

## 2018-07-31 NOTE — Telephone Encounter (Signed)
Last OV 07/24/18, No future OV  Last filled 07/02/18, # 60 with 0 refills

## 2018-07-31 NOTE — Telephone Encounter (Signed)
Copied from [place] [id number]. Topic: Quick Communication - Rx Refill/Question >> Jul 31, 2018 12:00 PM [name] wrote: Medication: amphetamine-dextroamphetamine (ADDERALL) 30 MG tablet  Pt states she send mychart request for this med, but I do not see this request Pt also wants the dr to know she is having back surgery on 08/21/18  [name], [name] [phone] (Phone) [phone] (Fax

## 2018-08-03 ENCOUNTER — Ambulatory Visit: Payer: Managed Care, Other (non HMO) | Admitting: Family Medicine

## 2018-08-09 ENCOUNTER — Other Ambulatory Visit: Payer: Self-pay | Admitting: Family Medicine

## 2018-08-10 NOTE — Pre-Procedure Instructions (Signed)
[name] Sherman  08/10/2018      [id number], [place] [address] Phone: [phone] Fax: [phone]    Your procedure is scheduled on February 25th.  Report to [facility] Entrance "A" at 10:45 A.M.  Call this number if you have problems the morning of surgery:  [phone]   Remember:  Do not eat or drink after midnight.     Take these medicines the morning of surgery with A SIP OF WATER   Levothyroxine (Synthroid)  Oxycodone  Pantoprazole (Protonix)    Do not wear jewelry, make-up or nail polish.  Do not wear lotions, powders, or perfumes, or deodorant.  Do not shave 48 hours prior to surgery.    Do not bring valuables to the hospital.  [facility] is not responsible for any belongings or valuables.   [facility]- Preparing For Surgery  Before surgery, you can play an important role. Because skin is not sterile, your skin needs to be as free of germs as possible. You can reduce the number of germs on your skin by washing with CHG (chlorahexidine gluconate) Soap before surgery.  CHG is an antiseptic cleaner which kills germs and bonds with the skin to continue killing germs even after washing.    Oral Hygiene is also important to reduce your risk of infection.  Remember - BRUSH YOUR TEETH THE MORNING OF SURGERY WITH YOUR REGULAR TOOTHPASTE  Please do not use if you have an allergy to CHG or antibacterial soaps. If your skin becomes reddened/irritated stop using the CHG.  Do not shave (including legs and underarms) for at least 48 hours prior to first CHG shower. It is OK to shave your face.  Please follow these instructions carefully.   1. Shower the NIGHT BEFORE SURGERY and the MORNING OF SURGERY with CHG.   2. If you chose to wash your hair, wash your hair first as usual with your normal shampoo.  3. After you shampoo, rinse your hair and body  thoroughly to remove the shampoo.  4. Use CHG as you would any other liquid soap. You can apply CHG directly to the skin and wash gently with a scrungie or a clean washcloth.   5. Apply the CHG Soap to your body ONLY FROM THE NECK DOWN.  Do not use on open wounds or open sores. Avoid contact with your eyes, ears, mouth and genitals (private parts). Wash Face and genitals (private parts)  with your normal soap.  6. Wash thoroughly, paying special attention to the area where your surgery will be performed.  7. Thoroughly rinse your body with warm water from the neck down.  8. DO NOT shower/wash with your normal soap after using and rinsing off the CHG Soap.  9. Pat yourself dry with a CLEAN TOWEL.  10. Wear CLEAN PAJAMAS to bed the night before surgery, wear comfortable clothes the morning of surgery  11. Place CLEAN SHEETS on your bed the night of your first shower and DO NOT SLEEP WITH PETS.   Day of Surgery:  Do not apply any deodorants/lotions.  Please wear clean clothes to the hospital/surgery center.   Remember to brush your teeth WITH YOUR REGULAR TOOTHPASTE.   Contacts, dentures or bridgework may not be worn into surgery.  Leave your suitcase in the car.  After surgery it may be brought to your  room.  For patients admitted to the hospital, discharge time will be determined by your treatment team.  Patients discharged the day of surgery will not be allowed to drive home.   Please read over the following fact sheets that you were given. Coughing and Deep Breathing, MRSA Information and Surgical Site Infection Prevention

## 2018-08-13 ENCOUNTER — Other Ambulatory Visit: Payer: Self-pay

## 2018-08-13 ENCOUNTER — Encounter (HOSPITAL_COMMUNITY)
Admission: RE | Admit: 2018-08-13 | Discharge: 2018-08-13 | Disposition: A | Discharge status: Home / Self Care | Payer: Managed Care, Other (non HMO) | Source: Ambulatory Visit | Attending: Neurological Surgery | Admitting: Neurological Surgery

## 2018-08-13 ENCOUNTER — Encounter (HOSPITAL_COMMUNITY): Payer: Self-pay | Admitting: Physician Assistant

## 2018-08-13 ENCOUNTER — Encounter (HOSPITAL_COMMUNITY): Payer: Self-pay

## 2018-08-13 DIAGNOSIS — R9431 Abnormal electrocardiogram [ECG] [EKG]: Secondary | ICD-10-CM | POA: Diagnosis not present

## 2018-08-13 DIAGNOSIS — I10 Essential (primary) hypertension: Secondary | ICD-10-CM | POA: Insufficient documentation

## 2018-08-13 DIAGNOSIS — Z01818 Encounter for other preprocedural examination: Secondary | ICD-10-CM | POA: Insufficient documentation

## 2018-08-13 LAB — TYPE AND SCREEN
ABO/RH(D): A POS
Antibody Screen: NEGATIVE

## 2018-08-13 LAB — CBC
HEMATOCRIT: 40.3 % (ref 36.0–46.0)
Hemoglobin: 12.6 g/dL (ref 12.0–15.0)
MCH: 25.8 pg — ABNORMAL LOW (ref 26.0–34.0)
MCHC: 31.3 g/dL (ref 30.0–36.0)
MCV: 82.6 fL (ref 80.0–100.0)
Platelets: 298 10*3/uL (ref 150–400)
RBC: 4.88 MIL/uL (ref 3.87–5.11)
RDW: 13.1 % (ref 11.5–15.5)
WBC: 8.1 10*3/uL (ref 4.0–10.5)
nRBC: 0 % (ref 0.0–0.2)

## 2018-08-13 LAB — BASIC METABOLIC PANEL
Anion gap: 10 (ref 5–15)
BUN: 12 mg/dL (ref 6–20)
CHLORIDE: 101 mmol/L (ref 98–111)
CO2: 25 mmol/L (ref 22–32)
Calcium: 9.2 mg/dL (ref 8.9–10.3)
Creatinine, Ser: 0.92 mg/dL (ref 0.44–1.00)
GFR calc Af Amer: >60 mL/min (ref >60)
GFR calc non Af Amer: >60 mL/min (ref >60)
Glucose, Bld: 144 mg/dL — ABNORMAL HIGH (ref 70–99)
Potassium: 3.4 mmol/L — ABNORMAL LOW (ref 3.5–5.1)
Sodium: 136 mmol/L (ref 135–145)

## 2018-08-13 LAB — ABO/RH: ABO/RH(D): A POS

## 2018-08-13 LAB — SURGICAL PCR SCREEN
MRSA, PCR: NEGATIVE
STAPHYLOCOCCUS AUREUS: NEGATIVE

## 2018-08-13 NOTE — Progress Notes (Signed)
PCP - Bruce Burchette   Chest x-ray - denies EKG - 08-13-18  DM - denies SA - yes, wears CPAP   Anesthesia review: yes, abnormal EKG  Patient denies shortness of breath, fever, cough and chest pain at PAT appointment   Patient verbalized understanding of instructions that were given to them at the PAT appointment. Patient was also instructed that they will need to review over the PAT instructions again at home before surgery.

## 2018-08-14 NOTE — Anesthesia Preprocedure Evaluation (Deleted)
Anesthesia Evaluation    Airway        Dental   Pulmonary           Cardiovascular hypertension,      Neuro/Psych    GI/Hepatic   Endo/Other    Renal/GU      Musculoskeletal   Abdominal   Peds  Hematology   Anesthesia Other Findings   Reproductive/Obstetrics                             Anesthesia Physical Anesthesia Plan  ASA:   Anesthesia Plan:    Post-op Pain Management:    Induction:   PONV Risk Score and Plan:   Airway Management Planned:   Additional Equipment:   Intra-op Plan:   Post-operative Plan:   Informed Consent:   Plan Discussed with:   Anesthesia Plan Comments: (PAT note written 08/14/2018 by Myra Gianotti, PA-C. )        Anesthesia Quick Evaluation

## 2018-08-14 NOTE — Progress Notes (Signed)
Anesthesia Chart Review:  Case:  [id number] Date/Time:  08/21/18 1238   Procedure:  Lumbar 5 Sacral 1 Posterior lumbar interbody fusion/pedicle screw fixation (N/A Back) - Lumbar 5 Sacral 1 Posterior lumbar interbody fusion/pedicle screw fixation   Anesthesia type:  General   Pre-op diagnosis:  Herniated nucleus pulposus, Lumbar, Synovial Cyst   Location:  [facility] OR ROOM 87 / [facility] [facility]   Surgeon:  Kristeen Miss, MD      DISCUSSION: Patient is a 58 year old female scheduled for the above procedure.  History includes never somker, HLD, ADD, OSA (no CPAP after weight loss), HTN, hypothyroidism, anemia, IBS, cervical cancer (s/p ablation 1980's, hysterectomy 2003). BMI is consistent with morbid obesity.  The operative EKG and labs appear acceptable for OR.  No acute changes and I would anticipate that she can proceed as planned.   VS: BP (!) 134/58   Pulse 91   Temp 36.6 C   Resp 20   Ht 5\' 4"  (1.626 m)   Wt 119 kg   SpO2 96%   BMI 45.02 kg/m   PROVIDERS: [name], MD is PCP   LABS: Labs reviewed: Acceptable for surgery. (all labs ordered are listed, but only abnormal results are displayed)  Labs Reviewed  BASIC METABOLIC PANEL - Abnormal; Notable for the following components:      Result Value   Potassium 3.4 (*)    Glucose, Bld 144 (*)    All other components within normal limits  CBC - Abnormal; Notable for the following components:   MCH 25.8 (*)    All other components within normal limits  SURGICAL PCR SCREEN  TYPE AND SCREEN  ABO/RH    EKG: 08/13/18: Normal sinus rhythm Non-specific ST-t changes No significant change since last tracing 12/31/16 Confirmed by Levin Erp [phone]) on 08/13/2018 1:55:27 PM   CV: N/A  Past Medical History:  Diagnosis Date  . ADD (attention deficit disorder)   . Anemia   . Arthritis    shoulder, right (arthritis, tendonitis, bursitis)  . Cancer ([place])    cervical cancer (conization)   . History of cervical cancer    s/p  laser  ablation of cervix 1980's  . History of kidney stones   . Hyperlipidemia   . Hypertension   . Hypothyroidism   . IBS (irritable bowel syndrome)   . Irregular heart rate    as a child, has never had any problems   . Left ureteral stone   . Migraine   . OSA (obstructive sleep apnea)    pt used cpap up until 2013 states lost wt and did not need anymore  . Restless legs   . Umbilical hernia without obstruction or gangrene   . Urgency of urination   . Vitamin D deficiency     Past Surgical History:  Procedure Laterality Date  . ABDOMINAL HYSTERECTOMY    . BACK SURGERY  04/27/2018  . BUNIONECTOMY Right 2004  . COLONOSCOPY    . CYSTOSCOPY W/ RETROGRADES Bilateral 05/26/2015   Procedure: CYSTOSCOPY WITH RETROGRADE PYELOGRAM;  Surgeon: Festus Aloe, MD;  [facility];  Service: Urology;  Laterality: Bilateral;  . CYSTOSCOPY W/ URETERAL STENT REMOVAL Left 05/26/2015   Procedure: CYSTOSCOPY WITH STENT REMOVAL;  Surgeon: Festus Aloe, MD;  Location: [facility];  Service: Urology;  Laterality: Left;  . CYSTOSCOPY WITH RETROGRADE PYELOGRAM, URETEROSCOPY AND STENT PLACEMENT Left 05/01/2015   Procedure: CYSTOSCOPY WITH RETROGRADE PYELOGRAM AND STENT PLACEMENT;  Surgeon: Festus Aloe,  MD;  Location: [facility];  Service: Urology;  Laterality: Left;  . CYSTOSCOPY WITH STENT PLACEMENT Bilateral 05/26/2015   Procedure: CYSTOSCOPY WITH STENT PLACEMENT;  Surgeon: Festus Aloe, MD;  Location[facility];  Service: Urology;  Laterality: Bilateral;  . CYSTOSCOPY WITH URETEROSCOPY Bilateral 05/26/2015   Procedure: CYSTOSCOPY WITH URETEROSCOPY;  Surgeon: Festus Aloe, MD;  Location: [facility];  Service: Urology;  Laterality: Bilateral;  . CYSTOSCOPY/URETEROSCOPY/HOLMIUM LASER/STENT PLACEMENT Right 06/09/2015   Procedure: CYSTOSCOPY/URETEROSCOPY/STENT PLACEMENT REMOVAL LEFT URETERAL STENT;  Surgeon: Festus Aloe, MD;   [facility];  Service: Urology;  Laterality: Right;  . HOLMIUM LASER APPLICATION Left 68/01/8109   Procedure: HOLMIUM LASER APPLICATION;  Surgeon: Festus Aloe, MD;  Location: [facility];  Service: Urology;  Laterality: Left;  . HOLMIUM LASER APPLICATION Right 31/59/4585   Procedure: HOLMIUM LASER APPLICATION;  Surgeon: Festus Aloe, MD;  Location: [facility];  Service: Urology;  Laterality: Right;  . INSERTION OF MESH N/A 09/05/2016   Procedure: INSERTION OF MESH;  Surgeon: [name], MD;  Location: [place];  Service: General;  Laterality: N/A;  . LAPAROSCOPIC ASSISTED VAGINAL HYSTERECTOMY  04-23-2002   and Left Salpingoophorectomy/  Anterior Repair/  Tension Free Tape Sling placement  . LASER ABLATION OF THE CERVIX  x2  1980's  . TUBAL LIGATION  1980's  . VENTRAL HERNIA REPAIR N/A 09/05/2016   Procedure: LAPAROSCOPIC VENTRAL HERNIA;  Surgeon: [name], MD;  Location: [place];  Service: General;  Laterality: N/A;    MEDICATIONS: . amphetamine-dextroamphetamine (ADDERALL) 30 MG tablet  . atorvastatin (LIPITOR) 10 MG tablet  . citalopram (CELEXA) 20 MG tablet  . Estradiol (DIVIGEL) 0.75 MG/0.75GM GEL  . levothyroxine (SYNTHROID, LEVOTHROID) 50 MCG tablet  . losartan-hydrochlorothiazide (HYZAAR) 50-12.5 MG tablet  . Multiple Vitamins-Minerals (HAIR SKIN AND NAILS FORMULA) TABS  . oxyCODONE-acetaminophen (PERCOCET/ROXICET) 5-325 MG tablet  . pantoprazole (PROTONIX) 40 MG tablet  . SUMAtriptan (IMITREX) 100 MG tablet  . Vitamin D, Ergocalciferol, (DRISDOL) 1.25 MG (50000 UT) CAPS capsule   No current facility-administered medications for this encounter.     Myra Gianotti, PA-C Surgical Short Stay/Anesthesiology [facility] Phone [phone] [facility] Phone [phone] 08/14/2018 2:52 PM

## 2018-08-21 ENCOUNTER — Ambulatory Visit: Payer: Managed Care, Other (non HMO) | Admitting: Gastroenterology

## 2018-09-03 MED ORDER — VANCOMYCIN HCL 10 G IV SOLR
1500.0000 mg | INTRAVENOUS | Status: AC
  Filled 2018-09-03: qty 1500

## 2018-09-04 ENCOUNTER — Inpatient Hospital Stay (HOSPITAL_COMMUNITY)
Admission: RE | Admit: 2018-09-04 | Payer: Managed Care, Other (non HMO) | Source: Ambulatory Visit | Admitting: Neurological Surgery

## 2018-09-04 SURGERY — POSTERIOR LUMBAR FUSION 1 LEVEL
Anesthesia: General | Site: Back

## 2018-09-12 ENCOUNTER — Other Ambulatory Visit: Payer: Self-pay | Admitting: Family Medicine

## 2018-09-22 ENCOUNTER — Encounter: Payer: Self-pay | Admitting: Family Medicine

## 2018-09-22 ENCOUNTER — Other Ambulatory Visit: Payer: Self-pay | Admitting: Family Medicine

## 2018-09-24 MED ORDER — AMPHETAMINE-DEXTROAMPHETAMINE 30 MG PO TABS: 30.0000 mg | ORAL_TABLET | Freq: Two times a day (BID) | ORAL | 0 refills | Status: DC

## 2018-09-24 MED ORDER — CITALOPRAM HYDROBROMIDE 20 MG PO TABS: ORAL_TABLET | ORAL | 0 refills | Status: DC

## 2018-09-24 NOTE — Telephone Encounter (Signed)
Last OV 07/24/18, No future OV  Last filled 07/31/18, # 60 with 0 refills

## 2018-09-25 ENCOUNTER — Other Ambulatory Visit: Payer: Self-pay

## 2018-09-25 MED ORDER — SUMATRIPTAN SUCCINATE 100 MG PO TABS: 100.0000 mg | ORAL_TABLET | ORAL | 11 refills | Status: DC | PRN

## 2018-10-23 ENCOUNTER — Ambulatory Visit: Payer: Managed Care, Other (non HMO) | Admitting: Family Medicine

## 2018-10-23 ENCOUNTER — Encounter: Payer: Self-pay | Admitting: Family Medicine

## 2018-10-23 ENCOUNTER — Other Ambulatory Visit: Payer: Self-pay

## 2018-10-23 VITALS — BP 108/72 | HR 81 | Temp 98.0°F | Ht 64.0 in | Wt 264.5 lb

## 2018-10-23 DIAGNOSIS — G510 Bell's palsy: Secondary | ICD-10-CM

## 2018-10-23 MED ORDER — PREDNISONE 20 MG PO TABS: ORAL_TABLET | ORAL | 0 refills | Status: DC

## 2018-10-23 NOTE — Progress Notes (Signed)
Subjective:     Patient ID: [name] Sherman, female   DOB: Nov 09, 1960, 58 y.o.   MRN: [id number]  HPI We had contacted patient initially earlier today but because of some complaints of left-sided facial pain and headache radiating into the ear and jaw region.  She also has felt like she may have had some swelling of her left upper lip.  She states she did not see any swelling but her lip felt differently.  After further questioning it sounds like she had some left facial weakness.  We ended up converting this to an office evaluation and she comes in now to evaluate.  She states just earlier today she has noticed some increased difficulty with closing her left eyelid compared to the right and has noticed some altered taste.  She has noticed a little bit of droop left corner of the mouth and also a little bit of weakness of the left forehead compared to the right.  She has not had any pain with eating.  No difficulty swallowing.  No upper or lower extremity weakness or numbness.  Past Medical History:  Diagnosis Date  . ADD (attention deficit disorder)   . Anemia   . Arthritis    shoulder, right (arthritis, tendonitis, bursitis)  . Cancer ([place])    cervical cancer (conization)   . History of cervical cancer    s/p  laser ablation of cervix 1980's  . History of kidney stones   . Hyperlipidemia   . Hypertension   . Hypothyroidism   . IBS (irritable bowel syndrome)   . Irregular heart rate    as a child, has never had any problems   . Left ureteral stone   . Migraine   . OSA (obstructive sleep apnea)    pt used cpap up until 2013 states lost wt and did not need anymore  . Restless legs   . Umbilical hernia without obstruction or gangrene   . Urgency of urination   . Vitamin D deficiency    Past Surgical History:  Procedure Laterality Date  . ABDOMINAL HYSTERECTOMY    . BACK SURGERY  04/27/2018  . BUNIONECTOMY Right 2004  . COLONOSCOPY    . CYSTOSCOPY W/ RETROGRADES Bilateral  05/26/2015   Procedure: CYSTOSCOPY WITH RETROGRADE PYELOGRAM;  Surgeon: Festus Aloe, MD;  Location: [facility];  Service: Urology;  Laterality: Bilateral;  . CYSTOSCOPY W/ URETERAL STENT REMOVAL Left 05/26/2015   Procedure: CYSTOSCOPY WITH STENT REMOVAL;  Surgeon: Festus Aloe, MD;  [facility];  Service: Urology;  Laterality: Left;  . CYSTOSCOPY WITH RETROGRADE PYELOGRAM, URETEROSCOPY AND STENT PLACEMENT Left 05/01/2015   Procedure: CYSTOSCOPY WITH RETROGRADE PYELOGRAM AND STENT PLACEMENT;  Surgeon: Festus Aloe, MD;  [facility];  Service: Urology;  Laterality: Left;  . CYSTOSCOPY WITH STENT PLACEMENT Bilateral 05/26/2015   Procedure: CYSTOSCOPY WITH STENT PLACEMENT;  Surgeon: Festus Aloe, MD;  Location: [facility];  Service: Urology;  Laterality: Bilateral;  . CYSTOSCOPY WITH URETEROSCOPY Bilateral 05/26/2015   Procedure: CYSTOSCOPY WITH URETEROSCOPY;  Surgeon: Festus Aloe, MD;  [facility];  Service: Urology;  Laterality: Bilateral;  . CYSTOSCOPY/URETEROSCOPY/HOLMIUM LASER/STENT PLACEMENT Right 06/09/2015   Procedure: CYSTOSCOPY/URETEROSCOPY/STENT PLACEMENT REMOVAL LEFT URETERAL STENT;  Surgeon: Festus Aloe, MD;  [facility];  Service: Urology;  Laterality: Right;  . HOLMIUM LASER APPLICATION Left 03/50/0938   Procedure: HOLMIUM LASER APPLICATION;  Surgeon: Festus Aloe, MD;  Location: [facility];  Service: Urology;  Laterality:  Left;  . HOLMIUM LASER APPLICATION Right 84/66/5993   Procedure: HOLMIUM LASER APPLICATION;  Surgeon: Festus Aloe, MD;  [facility];  Service: Urology;  Laterality: Right;  . INSERTION OF MESH N/A 09/05/2016   Procedure: INSERTION OF MESH;  Surgeon: [name], MD;  Location: [place];  Service: General;  Laterality: N/A;  . LAPAROSCOPIC ASSISTED VAGINAL HYSTERECTOMY  04-23-2002   and Left Salpingoophorectomy/  Anterior  Repair/  Tension Free Tape Sling placement  . LASER ABLATION OF THE CERVIX  x2  1980's  . TUBAL LIGATION  1980's  . VENTRAL HERNIA REPAIR N/A 09/05/2016   Procedure: LAPAROSCOPIC VENTRAL HERNIA;  Surgeon: [name], MD;  Location: [place];  Service: General;  Laterality: N/A;    reports that she has never smoked. She has never used smokeless tobacco. She reports that she does not drink alcohol or use drugs. family history includes Alcohol abuse in her mother; Arthritis in her father; Cancer in her maternal grandmother and paternal grandmother; Heart disease in her father; Hyperlipidemia in her father; Hypertension in her father; Kidney disease in her paternal uncle; Lung cancer in her mother. Allergies  Allergen Reactions  . Keflex [Cephalexin] Swelling    Tongue swelling     Review of Systems  Constitutional: Negative for chills and fever.  HENT: Positive for ear pain. Negative for ear discharge, facial swelling and hearing loss.   Respiratory: Negative for cough and shortness of breath.   Cardiovascular: Negative for chest pain.       Objective:   Physical Exam Constitutional:      Appearance: Normal appearance.  HENT:     Right Ear: Tympanic membrane, ear canal and external ear normal.     Left Ear: Tympanic membrane, ear canal and external ear normal.  Eyes:     Pupils: Pupils are equal, round, and reactive to light.  Neck:     Musculoskeletal: Neck supple.  Cardiovascular:     Rate and Rhythm: Normal rate and regular rhythm.  Pulmonary:     Effort: Pulmonary effort is normal.     Breath sounds: Normal breath sounds.  Lymphadenopathy:     Cervical: No cervical adenopathy.  Neurological:     Mental Status: She is alert and oriented to person, place, and time.     Comments: She has some very mild weakness with closing the left eyelid compared to the right.  There is very diminished wrinkling of the left forehead compared to the right.  She has very mild weakness at  the corner of the left mouth.  Otherwise cranial nerves intact.  No weakness upper or lower extremities.  Cerebellar function normal.  Gait normal.        Assessment:     Presents with few day history of some left facial pain.  She has some subtle neurologic findings suggesting mild left facial nerve weakness.  Suspect Bell's palsy.  She does have some subtle weakness of the left forehead compared to the right and taste changes which may go along with this    Plan:     -We recommended prednisone 60 mg daily for 7 days -Reviewed findings for Bell's palsy and if she has any progressive weakness consider Valtrex thousand milligrams 3 times daily for 7 days -We recommend touch base for follow-up in about a week.  Sooner for any new symptoms or progressive symptoms  [name] MD  Primary Care at [facility]

## 2018-10-23 NOTE — Patient Instructions (Signed)
Bell Palsy, Adult  Bell palsy is a short-term inability to move muscles in part of the face. The inability to move (paralysis) results from inflammation or compression of the facial nerve, which travels along the skull and under the ear to the side of the face (7th cranial nerve). This nerve is responsible for facial movements that include blinking, closing the eyes, smiling, and frowning. What are the causes? The exact cause of this condition is not known. It may be caused by an infection from a virus, such as the chickenpox (herpes zoster), Epstein-Barr, or mumps virus. What increases the risk? You are more likely to develop this condition if:  You are pregnant.  You have diabetes.  You have had a recent infection in your nose, throat, or airways (upper respiratory infection).  You have a weakened body defense system (immune system).  You have had a facial injury, such as a fracture.  You have a family history of Bell palsy. What are the signs or symptoms? Symptoms of this condition include:  Weakness on one side of the face.  Drooping eyelid and corner of the mouth.  Excessive tearing in one eye.  Difficulty closing the eyelid.  Dry eye.  Drooling.  Dry mouth.  Changes in taste.  Change in facial appearance.  Pain behind one ear.  Ringing in one or both ears.  Sensitivity to sound in one ear.  Facial twitching.  Headache.  Impaired speech.  Dizziness.  Difficulty eating or drinking. Most of the time, only one side of the face is affected. Rarely, Bell palsy affects the whole face. How is this diagnosed? This condition is diagnosed based on:  Your symptoms.  Your medical history.  A physical exam. You may also have to see health care providers who specialize in disorders of the nerves (neurologist) or diseases and conditions of the eye (ophthalmologist). You may have tests, such as:  A test to check for nerve damage (electromyogram).  Imaging  studies, such as CT or MRI scans.  Blood tests. How is this treated? This condition affects every person differently. Sometimes symptoms go away without treatment within a couple weeks. If treatment is needed, it varies from person to person. The goal of treatment is to reduce inflammation and protect the eye from damage. Treatment for Bell palsy may include:  Medicines, such as: ? Steroids to reduce swelling and inflammation. ? Antiviral drugs. ? Pain relievers, including aspirin, acetaminophen, or ibuprofen.  Eye drops or ointment to keep your eye moist.  Eye protection, if you cannot close your eye.  Exercises or massage to regain muscle strength and function (physical therapy). Follow these instructions at home:   Take over-the-counter and prescription medicines only as told by your health care provider.  If your eye is affected: ? Keep your eye moist with eye drops or ointment as told by your health care provider. ? Follow instructions for eye care and protection as told by your health care provider.  Do any physical therapy exercises as told by your health care provider.  Keep all follow-up visits as told by your health care provider. This is important. Contact a health care provider if:  You have a fever.  Your symptoms do not get better within 2-3 weeks, or your symptoms get worse.  Your eye is red, irritated, or painful.  You have new symptoms. Get help right away if:  You have weakness or numbness in a part of your body other than your face.  You have   trouble swallowing.  You develop neck pain or stiffness.  You develop dizziness or shortness of breath. Summary  Bell palsy is a short-term inability to move muscles in part of the face. The inability to move (paralysis) results from inflammation or compression of the facial nerve.  This condition affects every person differently. Sometimes symptoms go away without treatment within a couple weeks.  If  treatment is needed, it varies from person to person. The goal of treatment is to reduce inflammation and protect the eye from damage.  Contact your health care provider if your symptoms do not get better within 2-3 weeks, or your symptoms get worse. This information is not intended to replace advice given to you by your health care provider. Make sure you discuss any questions you have with your health care provider. Document Released: 06/13/2005 Document Revised: 05/12/2017 Document Reviewed: 08/16/2016 Elsevier Interactive Patient Education  2019 Elsevier Inc.  

## 2018-11-19 ENCOUNTER — Other Ambulatory Visit: Payer: Self-pay | Admitting: Family Medicine

## 2018-11-20 ENCOUNTER — Other Ambulatory Visit: Payer: Self-pay | Admitting: Family Medicine

## 2018-11-20 NOTE — Telephone Encounter (Signed)
Medication is not on current medication list. Patient has taken in the past.   OK to fill?

## 2018-11-20 NOTE — Telephone Encounter (Signed)
Refill once 

## 2018-11-26 ENCOUNTER — Telehealth: Payer: Self-pay | Admitting: Family Medicine

## 2018-11-26 MED ORDER — AMPHETAMINE-DEXTROAMPHETAMINE 30 MG PO TABS: 30.0000 mg | ORAL_TABLET | Freq: Two times a day (BID) | ORAL | 0 refills | Status: DC

## 2018-11-26 NOTE — Telephone Encounter (Signed)
Last OV 10/23/18, No future OV  Last filled 09/24/18, # 68 with 0 refills

## 2018-11-26 NOTE — Addendum Note (Signed)
Addended by: [name] on: 11/26/2018 05:01 PM   Modules accepted: Orders

## 2018-11-26 NOTE — Telephone Encounter (Signed)
Patient needs a refill  amphetamine-dextroamphetamine (ADDERALL) 30 MG tablet    Sent to:  [facility] [place], [place] [phone] (Phone) [phone] (Fax)

## 2018-12-17 ENCOUNTER — Other Ambulatory Visit: Payer: Self-pay | Admitting: Family Medicine

## 2018-12-20 ENCOUNTER — Telehealth: Payer: Self-pay

## 2018-12-20 ENCOUNTER — Other Ambulatory Visit: Payer: Self-pay | Admitting: Family Medicine

## 2018-12-20 NOTE — Telephone Encounter (Signed)
Patient called to advise she has been having bouts of vertigo for several weeks now, it has made her nauseous and she has vomited once. She states any movement causes her head to spin. She denies any recent nasal/sinus congestion, ear pain, throat pain or other symptoms. She does report frequent headaches, these are not new. She would like to know your recommendations. She would like to have a brain scan.  Dr. Elease Hashimoto - Please advise if pt should be seen in office or via virtual visit. Thanks!

## 2018-12-20 NOTE — Telephone Encounter (Signed)
Your schedule is completely full - I am not sure where to try and work her in. Please advise. Thanks!

## 2018-12-20 NOTE — Telephone Encounter (Signed)
Probably should set up as office exam.  Needs neuro exam

## 2018-12-21 ENCOUNTER — Encounter: Payer: Self-pay | Admitting: Family Medicine

## 2018-12-21 ENCOUNTER — Other Ambulatory Visit: Payer: Self-pay

## 2018-12-21 ENCOUNTER — Ambulatory Visit (INDEPENDENT_AMBULATORY_CARE_PROVIDER_SITE_OTHER): Payer: Managed Care, Other (non HMO) | Admitting: Family Medicine

## 2018-12-21 VITALS — BP 128/82 | HR 100 | Temp 98.1°F | Ht 64.0 in | Wt 258.8 lb

## 2018-12-21 DIAGNOSIS — R42 Dizziness and giddiness: Secondary | ICD-10-CM

## 2018-12-21 DIAGNOSIS — R110 Nausea: Secondary | ICD-10-CM

## 2018-12-21 NOTE — Telephone Encounter (Signed)
Are we allowed to create a 4 pm slot?  If not, I guess we would have to see next week- unless she can see another provider.

## 2018-12-21 NOTE — Progress Notes (Signed)
Subjective:     Patient ID: [name] Sherman, female   DOB: 1960-07-24, 58 y.o.   MRN: [id number]  HPI Patient seen with vertigo.  Onset about 3 weeks ago.  First time she noticed that she had fairly severe associated nausea and vomiting.  She has had some intermittent nausea since then.  No atypical headaches.  Symptoms are clearly worse when looking to the left.  She has not had any hearing loss.  No tinnitus.  No fevers or chills.  No ear pain.  No ataxia.  No focal weakness.  No confusion.  Only prior episode vertigo was many years ago following a head trauma.  She has had none since then until this episode.  She has not tried any medications.  Symptoms are clearly worse with head movement.  No speech changes.  No focal weakness.  Past Medical History:  Diagnosis Date  . ADD (attention deficit disorder)   . Anemia   . Arthritis    shoulder, right (arthritis, tendonitis, bursitis)  . Cancer ([place])    cervical cancer ([place])   . History of cervical cancer    s/p  laser ablation of cervix 1980's  . History of kidney stones   . Hyperlipidemia   . Hypertension   . Hypothyroidism   . IBS (irritable bowel syndrome)   . Irregular heart rate    as a child, has never had any problems   . Left ureteral stone   . Migraine   . OSA (obstructive sleep apnea)    pt used cpap up until 2013 states lost wt and did not need anymore  . Restless legs   . Umbilical hernia without obstruction or gangrene   . Urgency of urination   . Vitamin D deficiency    Past Surgical History:  Procedure Laterality Date  . ABDOMINAL HYSTERECTOMY    . BACK SURGERY  04/27/2018  . BUNIONECTOMY Right 2004  . COLONOSCOPY    . CYSTOSCOPY W/ RETROGRADES Bilateral 05/26/2015   Procedure: CYSTOSCOPY WITH RETROGRADE PYELOGRAM;  Surgeon: Festus Aloe, MD;  Location: [facility];  Service: Urology;  Laterality: Bilateral;  . CYSTOSCOPY W/ URETERAL STENT REMOVAL Left 05/26/2015   Procedure:  CYSTOSCOPY WITH STENT REMOVAL;  Surgeon: Festus Aloe, MD;  Location: [facility];  Service: Urology;  Laterality: Left;  . CYSTOSCOPY WITH RETROGRADE PYELOGRAM, URETEROSCOPY AND STENT PLACEMENT Left 05/01/2015   Procedure: CYSTOSCOPY WITH RETROGRADE PYELOGRAM AND STENT PLACEMENT;  Surgeon: Festus Aloe, MD;  Location: [facility];  Service: Urology;  Laterality: Left;  . CYSTOSCOPY WITH STENT PLACEMENT Bilateral 05/26/2015   Procedure: CYSTOSCOPY WITH STENT PLACEMENT;  Surgeon: Festus Aloe, MD;  Location: [facility];  Service: Urology;  Laterality: Bilateral;  . CYSTOSCOPY WITH URETEROSCOPY Bilateral 05/26/2015   Procedure: CYSTOSCOPY WITH URETEROSCOPY;  Surgeon: Festus Aloe, MD;  Location: [facility];  Service: Urology;  Laterality: Bilateral;  . CYSTOSCOPY/URETEROSCOPY/HOLMIUM LASER/STENT PLACEMENT Right 06/09/2015   Procedure: CYSTOSCOPY/URETEROSCOPY/STENT PLACEMENT REMOVAL LEFT URETERAL STENT;  Surgeon: Festus Aloe, MD;  Location: [facility];  Service: Urology;  Laterality: Right;  . HOLMIUM LASER APPLICATION Left 21/97/5883   Procedure: HOLMIUM LASER APPLICATION;  Surgeon: Festus Aloe, MD;  [facility];  Service: Urology;  Laterality: Left;  . HOLMIUM LASER APPLICATION Right 25/49/8264   Procedure: HOLMIUM LASER APPLICATION;  Surgeon: Festus Aloe, MD;  Location: [facility];  Service: Urology;  Laterality: Right;  . INSERTION OF MESH N/A 09/05/2016   Procedure: INSERTION OF  MESH;  Surgeon: [name], MD;  Location: [place];  Service: General;  Laterality: N/A;  . LAPAROSCOPIC ASSISTED VAGINAL HYSTERECTOMY  04-23-2002   and Left Salpingoophorectomy/  Anterior Repair/  Tension Free Tape Sling placement  . LASER ABLATION OF THE CERVIX  x2  1980's  . TUBAL LIGATION  1980's  . VENTRAL HERNIA REPAIR N/A 09/05/2016   Procedure: LAPAROSCOPIC VENTRAL HERNIA;  Surgeon: [name], MD;  Location: [place];   Service: General;  Laterality: N/A;    reports that she has never smoked. She has never used smokeless tobacco. She reports that she does not drink alcohol or use drugs. family history includes Alcohol abuse in her mother; Arthritis in her father; Cancer in her maternal grandmother and paternal grandmother; Heart disease in her father; Hyperlipidemia in her father; Hypertension in her father; Kidney disease in her paternal uncle; Lung cancer in her mother. Allergies  Allergen Reactions  . Keflex [Cephalexin] Swelling    Tongue swelling     Review of Systems  Constitutional: Negative for chills and fever.  HENT: Negative for ear discharge, ear pain and hearing loss.   Eyes: Negative for visual disturbance.  Respiratory: Negative for cough and shortness of breath.   Cardiovascular: Negative for chest pain.  Neurological: Positive for dizziness. Negative for seizures, syncope, speech difficulty, weakness and headaches.  Psychiatric/Behavioral: Negative for confusion.       Objective:   Physical Exam Constitutional:      Appearance: Normal appearance.  HENT:     Right Ear: Tympanic membrane and external ear normal.     Left Ear: Tympanic membrane and external ear normal.  Eyes:     Extraocular Movements: Extraocular movements intact.     Pupils: Pupils are equal, round, and reactive to light.  Neck:     Musculoskeletal: Neck supple.  Cardiovascular:     Rate and Rhythm: Normal rate and regular rhythm.  Pulmonary:     Effort: Pulmonary effort is normal.     Breath sounds: Normal breath sounds.  Neurological:     General: No focal deficit present.     Mental Status: She is alert and oriented to person, place, and time.     Cranial Nerves: No cranial nerve deficit.     Motor: No weakness.     Coordination: Coordination normal.     Gait: Gait normal.     Comments: No obvious nystagmus.  She has vertigo symptoms that are reproduced with looking to the left and laying back quickly  but not to the right        Assessment:     Vertigo.  Suspect benign peripheral positional vertigo to the left    Plan:     -We recommended Epley maneuvers with handout given -She will try these at least 3 times daily and be in touch by Monday if symptoms not resolving.  Consider vestibular rehab if not improved with home exercises -We also reviewed red flags to watch out for for more worrisome vertigo  [name] MD [facility]

## 2018-12-21 NOTE — Patient Instructions (Signed)
Benign Positional Vertigo Vertigo is the feeling that you or your surroundings are moving when they are not. Benign positional vertigo is the most common form of vertigo. This is usually a harmless condition (benign). This condition is positional. This means that symptoms are triggered by certain movements and positions. This condition can be dangerous if it occurs while you are doing something that could cause harm to you or others. This includes activities such as driving or operating machinery. What are the causes? In many cases, the cause of this condition is not known. It may be caused by a disturbance in an area of the inner ear that helps your brain to sense movement and balance. This disturbance can be caused by:  Viral infection (labyrinthitis).  Head injury.  Repetitive motion, such as jumping, dancing, or running. What increases the risk? You are more likely to develop this condition if:  You are a woman.  You are 50 years of age or older. What are the signs or symptoms? Symptoms of this condition usually happen when you move your head or your eyes in different directions. Symptoms may start suddenly, and usually last for less than a minute. They include:  Loss of balance and falling.  Feeling like you are spinning or moving.  Feeling like your surroundings are spinning or moving.  Nausea and vomiting.  Blurred vision.  Dizziness.  Involuntary eye movement (nystagmus). Symptoms can be mild and cause only minor problems, or they can be severe and interfere with daily life. Episodes of benign positional vertigo may return (recur) over time. Symptoms may improve over time. How is this diagnosed? This condition may be diagnosed based on:  Your medical history.  Physical exam of the head, neck, and ears.  Tests, such as: ? MRI. ? CT scan. ? Eye movement tests. Your health care provider may ask you to change positions quickly while he or she watches you for symptoms  of benign positional vertigo, such as nystagmus. Eye movement may be tested with a variety of exams that are designed to evaluate or stimulate vertigo. ? An electroencephalogram (EEG). This records electrical activity in your brain. ? Hearing tests. You may be referred to a health care provider who specializes in ear, nose, and throat (ENT) problems (otolaryngologist) or a provider who specializes in disorders of the nervous system (neurologist). How is this treated?  This condition may be treated in a session in which your health care provider moves your head in specific positions to adjust your inner ear back to normal. Treatment for this condition may take several sessions. Surgery may be needed in severe cases, but this is rare. In some cases, benign positional vertigo may resolve on its own in 2-4 weeks. Follow these instructions at home: Safety  Move slowly. Avoid sudden body or head movements or certain positions, as told by your health care provider.  Avoid driving until your health care provider says it is safe for you to do so.  Avoid operating heavy machinery until your health care provider says it is safe for you to do so.  Avoid doing any tasks that would be dangerous to you or others if vertigo occurs.  If you have trouble walking or keeping your balance, try using a cane for stability. If you feel dizzy or unstable, sit down right away.  Return to your normal activities as told by your health care provider. Ask your health care provider what activities are safe for you. General instructions  Take over-the-counter   and prescription medicines only as told by your health care provider.  Drink enough fluid to keep your urine pale yellow.  Keep all follow-up visits as told by your health care provider. This is important. Contact a health care provider if:  You have a fever.  Your condition gets worse or you develop new symptoms.  Your family or friends notice any  behavioral changes.  You have nausea or vomiting that gets worse.  You have numbness or a "pins and needles" sensation. Get help right away if you:  Have difficulty speaking or moving.  Are always dizzy.  Faint.  Develop severe headaches.  Have weakness in your legs or arms.  Have changes in your hearing or vision.  Develop a stiff neck.  Develop sensitivity to light. Summary  Vertigo is the feeling that you or your surroundings are moving when they are not. Benign positional vertigo is the most common form of vertigo.  The cause of this condition is not known. It may be caused by a disturbance in an area of the inner ear that helps your brain to sense movement and balance.  Symptoms include loss of balance and falling, feeling that you or your surroundings are moving, nausea and vomiting, and blurred vision.  This condition can be diagnosed based on symptoms, physical exam, and other tests, such as MRI, CT scan, eye movement tests, and hearing tests.  Follow safety instructions as told by your health care provider. You will also be told when to contact your health care provider in case of problems. This information is not intended to replace advice given to you by your health care provider. Make sure you discuss any questions you have with your health care provider. Document Released: 03/21/2006 Document Revised: 11/22/2017 Document Reviewed: 11/22/2017 Elsevier Patient Education  2020 [employer].

## 2018-12-21 NOTE — Telephone Encounter (Signed)
Pt scheduled for 4pm today per BB.

## 2018-12-25 ENCOUNTER — Telehealth: Payer: Self-pay | Admitting: Family Medicine

## 2018-12-25 NOTE — Telephone Encounter (Signed)
Medication Refill - Medication: amphetamine-dextroamphetamine (ADDERALL) 30 MG tablet   Pt is requesting 3 month supply. Please advise if possible.   Has the patient contacted their pharmacy? Yes.   (Agent: If no, request that the patient contact the pharmacy for the refill.) (Agent: If yes, when and what did the pharmacy advise?)  Preferred Pharmacy (with phone number or street name):  [facility] [address], [address]  [place] [address]  Phone: [phone] Fax: [phone]      Agent: Please be advised that RX refills may take up to 3 business days. We ask that you follow-up with your pharmacy.

## 2018-12-26 MED ORDER — AMPHETAMINE-DEXTROAMPHETAMINE 30 MG PO TABS: 30.0000 mg | ORAL_TABLET | Freq: Two times a day (BID) | ORAL | 0 refills | Status: DC

## 2018-12-26 NOTE — Telephone Encounter (Signed)
Last OV 12/21/18, No future OV  Last filled 11/26/18, #60 with 0 refills

## 2019-01-03 ENCOUNTER — Other Ambulatory Visit: Payer: Self-pay | Admitting: Neurological Surgery

## 2019-01-16 NOTE — Pre-Procedure Instructions (Signed)
[name] Sherman  01/16/2019      [id number] [address], [address] [place] [address] Phone: [phone] Fax: [phone]    Your procedure is scheduled on January 21, 2019.  Report to [facility] Admitting at 945 AM.  Call this number if you have problems the morning of surgery:  [phone]  Call [phone] if you have any questions prior to your surgery date Monday-Friday 8am-4pm    Remember:  Do not eat or drink after midnight.     Take these medicines the morning of surgery with A SIP OF WATER  levothyroxine (SYNTHROID) pantoprazole (PROTONIX)  oxyCODONE-acetaminophen (PERCOCET/ROXICET)-if needed  DO NOT take Adderall the day of your procedure  Beginning now, STOP taking any Indomethacin (Indocin), Aspirin (unless otherwise instructed by your surgeon), Aleve, Naproxen, Ibuprofen, Motrin, Advil, Goody's, BC's, all herbal medications, fish oil, and all vitamins    Day of surgery:  Do not wear jewelry, make-up or nail polish.  Do not wear lotions, powders, or perfumes, or deodorant.  Do not shave 48 hours prior to surgery.    Do not bring valuables to the hospital  [facility] is not responsible for any belongings or valuables.  IF you are a smoker, DO NOT Smoke 24 hours prior to surgery   IF you wear a CPAP at night please bring your mask, tubing, and machine the morning of surgery    Remember that you must have someone to transport you home after your surgery, and remain with you for 24 hours if you are discharged the same day.  Contacts, dentures or bridgework may not be worn into surgery.  Leave your suitcase in the car.  After surgery it may be brought to your room.  For patients admitted to the hospital, discharge time will be determined by your treatment team.  Patients discharged the day of surgery will not be allowed to drive home.    [facility]- Preparing For Surgery  Before surgery, you can play an important role. Because skin is not sterile, your skin needs to be as free of germs as possible. You can reduce the number of germs on your skin by washing with CHG (chlorahexidine gluconate) Soap before surgery.  CHG is an antiseptic cleaner which kills germs and bonds with the skin to continue killing germs even after washing.    Oral Hygiene is also important to reduce your risk of infection.  Remember - BRUSH YOUR TEETH THE MORNING OF SURGERY WITH YOUR REGULAR TOOTHPASTE  Please do not use if you have an allergy to CHG or antibacterial soaps. If your skin becomes reddened/irritated stop using the CHG.  Do not shave (including legs and underarms) for at least 48 hours prior to first CHG shower. It is OK to shave your face.  Please follow these instructions carefully.   1. Shower the NIGHT BEFORE SURGERY and the MORNING OF SURGERY with CHG.   2. If you chose to wash your hair, wash your hair first as usual with your normal shampoo.  3. After you shampoo, rinse your hair and body thoroughly to remove the shampoo.  4. Use CHG as you would any other liquid soap. You can apply CHG directly to the skin and wash gently with a scrungie or a clean washcloth.   5. Apply the CHG Soap to your body ONLY FROM THE NECK  DOWN.  Do not use on open wounds or open sores. Avoid contact with your eyes, ears, mouth and genitals (private parts). Wash Face and genitals (private parts)  with your normal soap.  6. Wash thoroughly, paying special attention to the area where your surgery will be performed.  7. Thoroughly rinse your body with warm water from the neck down.  8. DO NOT shower/wash with your normal soap after using and rinsing off the CHG Soap.  9. Pat yourself dry with a CLEAN TOWEL.  10. Wear CLEAN PAJAMAS to bed the night before surgery, wear comfortable clothes the morning of surgery  11. Place CLEAN SHEETS on your bed the night of  your first shower and DO NOT SLEEP WITH PETS.  Day of Surgery: Shower as above Do not apply any deodorants/lotions.  Please wear clean clothes to the hospital/surgery center.   Remember to brush your teeth WITH YOUR REGULAR TOOTHPASTE.  Please read over the following fact sheets that you were given.

## 2019-01-17 ENCOUNTER — Other Ambulatory Visit: Payer: Self-pay | Admitting: Family Medicine

## 2019-01-17 ENCOUNTER — Encounter (HOSPITAL_COMMUNITY)
Admission: RE | Admit: 2019-01-17 | Discharge: 2019-01-17 | Disposition: A | Discharge status: Home / Self Care | Payer: Managed Care, Other (non HMO) | Source: Ambulatory Visit | Attending: Neurological Surgery | Admitting: Neurological Surgery

## 2019-01-17 ENCOUNTER — Encounter (HOSPITAL_COMMUNITY): Payer: Self-pay

## 2019-01-17 ENCOUNTER — Other Ambulatory Visit (HOSPITAL_COMMUNITY)
Admission: RE | Admit: 2019-01-17 | Discharge: 2019-01-17 | Disposition: A | Discharge status: Home / Self Care | Payer: Managed Care, Other (non HMO) | Source: Ambulatory Visit | Attending: Neurological Surgery | Admitting: Neurological Surgery

## 2019-01-17 ENCOUNTER — Other Ambulatory Visit: Payer: Self-pay

## 2019-01-17 DIAGNOSIS — Z01812 Encounter for preprocedural laboratory examination: Secondary | ICD-10-CM | POA: Diagnosis not present

## 2019-01-17 DIAGNOSIS — Z20828 Contact with and (suspected) exposure to other viral communicable diseases: Secondary | ICD-10-CM | POA: Diagnosis not present

## 2019-01-17 HISTORY — DX: Depression, unspecified: F32.A

## 2019-01-17 HISTORY — DX: Dizziness and giddiness: R42

## 2019-01-17 HISTORY — DX: Anxiety disorder, unspecified: F41.9

## 2019-01-17 LAB — CBC
HCT: 41.2 % (ref 36.0–46.0)
Hemoglobin: 12.8 g/dL (ref 12.0–15.0)
MCH: 26.1 pg (ref 26.0–34.0)
MCHC: 31.1 g/dL (ref 30.0–36.0)
MCV: 84.1 fL (ref 80.0–100.0)
Platelets: 337 10*3/uL (ref 150–400)
RBC: 4.9 MIL/uL (ref 3.87–5.11)
RDW: 12.9 % (ref 11.5–15.5)
WBC: 7.7 10*3/uL (ref 4.0–10.5)
nRBC: 0 % (ref 0.0–0.2)

## 2019-01-17 LAB — BASIC METABOLIC PANEL
Anion gap: 10 (ref 5–15)
BUN: 10 mg/dL (ref 6–20)
CO2: 25 mmol/L (ref 22–32)
Calcium: 9.3 mg/dL (ref 8.9–10.3)
Chloride: 104 mmol/L (ref 98–111)
Creatinine, Ser: 0.94 mg/dL (ref 0.44–1.00)
GFR calc Af Amer: >60 mL/min (ref >60)
GFR calc non Af Amer: >60 mL/min (ref >60)
Glucose, Bld: 118 mg/dL — ABNORMAL HIGH (ref 70–99)
Potassium: 3.7 mmol/L (ref 3.5–5.1)
Sodium: 139 mmol/L (ref 135–145)

## 2019-01-17 LAB — TYPE AND SCREEN
ABO/RH(D): A POS
Antibody Screen: NEGATIVE

## 2019-01-17 LAB — SURGICAL PCR SCREEN
MRSA, PCR: NEGATIVE
Staphylococcus aureus: NEGATIVE

## 2019-01-17 LAB — SARS CORONAVIRUS 2 (TAT 6-24 HRS): SARS Coronavirus 2: NEGATIVE

## 2019-01-17 MED ORDER — CHLORHEXIDINE GLUCONATE CLOTH 2 % EX PADS: 6.0000 | MEDICATED_PAD | Freq: Once | CUTANEOUS | Status: DC

## 2019-01-18 NOTE — Telephone Encounter (Signed)
Is patient supposed to be taking 3 per day or one pill per day?

## 2019-01-19 MED ORDER — VANCOMYCIN HCL 10 G IV SOLR
1500.0000 mg | INTRAVENOUS | Status: AC
  Administered 2019-01-21: 1500 mg via INTRAVENOUS
  Filled 2019-01-19 (×2): qty 1500

## 2019-01-19 NOTE — Telephone Encounter (Signed)
The only approved indication for Celexa 60 mg daily is obsessive compulsive disorder and I have never prescribed Celexa at that dose.  I would confirm is she is actually taking 60 mg daily and, if so, what the Celexa was started for.

## 2019-01-21 ENCOUNTER — Encounter (HOSPITAL_COMMUNITY)
Admission: RE | Disposition: A | Discharge status: Home / Self Care | Payer: Self-pay | Source: Home / Self Care | Attending: Neurological Surgery

## 2019-01-21 ENCOUNTER — Inpatient Hospital Stay (HOSPITAL_COMMUNITY): Payer: 59

## 2019-01-21 ENCOUNTER — Inpatient Hospital Stay (HOSPITAL_COMMUNITY): Payer: 59 | Admitting: Anesthesiology

## 2019-01-21 ENCOUNTER — Encounter (HOSPITAL_COMMUNITY): Payer: Self-pay | Admitting: Anesthesiology

## 2019-01-21 ENCOUNTER — Inpatient Hospital Stay (HOSPITAL_COMMUNITY)
Admission: RE | Admit: 2019-01-21 | Discharge: 2019-01-24 | DRG: 454 | Disposition: A | Discharge status: Home / Self Care | Payer: 59 | Attending: Neurological Surgery | Admitting: Neurological Surgery

## 2019-01-21 ENCOUNTER — Other Ambulatory Visit: Payer: Self-pay

## 2019-01-21 DIAGNOSIS — M2578 Osteophyte, vertebrae: Secondary | ICD-10-CM | POA: Diagnosis present

## 2019-01-21 DIAGNOSIS — M5116 Intervertebral disc disorders with radiculopathy, lumbar region: Secondary | ICD-10-CM | POA: Diagnosis present

## 2019-01-21 DIAGNOSIS — K219 Gastro-esophageal reflux disease without esophagitis: Secondary | ICD-10-CM | POA: Diagnosis present

## 2019-01-21 DIAGNOSIS — D62 Acute posthemorrhagic anemia: Secondary | ICD-10-CM | POA: Diagnosis not present

## 2019-01-21 DIAGNOSIS — M4807 Spinal stenosis, lumbosacral region: Principal | ICD-10-CM | POA: Diagnosis present

## 2019-01-21 DIAGNOSIS — Z419 Encounter for procedure for purposes other than remedying health state, unspecified: Secondary | ICD-10-CM

## 2019-01-21 DIAGNOSIS — M1990 Unspecified osteoarthritis, unspecified site: Secondary | ICD-10-CM | POA: Diagnosis present

## 2019-01-21 DIAGNOSIS — M5416 Radiculopathy, lumbar region: Secondary | ICD-10-CM | POA: Diagnosis present

## 2019-01-21 DIAGNOSIS — M4157 Other secondary scoliosis, lumbosacral region: Secondary | ICD-10-CM | POA: Diagnosis present

## 2019-01-21 DIAGNOSIS — M5117 Intervertebral disc disorders with radiculopathy, lumbosacral region: Secondary | ICD-10-CM | POA: Diagnosis present

## 2019-01-21 DIAGNOSIS — E039 Hypothyroidism, unspecified: Secondary | ICD-10-CM | POA: Diagnosis present

## 2019-01-21 HISTORY — PX: POSTERIOR LUMBAR FUSION: SHX6036

## 2019-01-21 SURGERY — POSTERIOR LUMBAR FUSION 1 LEVEL
Anesthesia: General | Site: Back

## 2019-01-21 MED ORDER — SODIUM CHLORIDE 0.9% FLUSH: 3.0000 mL | INTRAVENOUS | Status: DC | PRN

## 2019-01-21 MED ORDER — 0.9 % SODIUM CHLORIDE (POUR BTL) OPTIME
TOPICAL | Status: DC | PRN
  Administered 2019-01-21: 1000 mL

## 2019-01-21 MED ORDER — THROMBIN 5000 UNITS EX SOLR
CUTANEOUS | Status: AC
  Filled 2019-01-21: qty 5000

## 2019-01-21 MED ORDER — PHENYLEPHRINE 40 MCG/ML (10ML) SYRINGE FOR IV PUSH (FOR BLOOD PRESSURE SUPPORT)
PREFILLED_SYRINGE | INTRAVENOUS | Status: AC
  Filled 2019-01-21: qty 10

## 2019-01-21 MED ORDER — DEXAMETHASONE SODIUM PHOSPHATE 10 MG/ML IJ SOLN
INTRAMUSCULAR | Status: AC
  Filled 2019-01-21: qty 1

## 2019-01-21 MED ORDER — SUCCINYLCHOLINE CHLORIDE 200 MG/10ML IV SOSY
PREFILLED_SYRINGE | INTRAVENOUS | Status: AC
  Filled 2019-01-21: qty 10

## 2019-01-21 MED ORDER — SODIUM CHLORIDE 0.9 % IV SOLN
INTRAVENOUS | Status: DC | PRN
  Administered 2019-01-21: 500 mL

## 2019-01-21 MED ORDER — OXYCODONE HCL 5 MG/5ML PO SOLN: 5.0000 mg | Freq: Once | ORAL | Status: AC | PRN

## 2019-01-21 MED ORDER — LIDOCAINE-EPINEPHRINE 1 %-1:100000 IJ SOLN
INTRAMUSCULAR | Status: DC | PRN
  Administered 2019-01-21: 5 mL

## 2019-01-21 MED ORDER — LEVOTHYROXINE SODIUM 50 MCG PO TABS
50.0000 ug | ORAL_TABLET | Freq: Every day | ORAL | Status: DC
  Administered 2019-01-22 – 2019-01-24 (×3): 50 ug via ORAL
  Filled 2019-01-21 (×3): qty 1

## 2019-01-21 MED ORDER — ACETAMINOPHEN 10 MG/ML IV SOLN
INTRAVENOUS | Status: AC
  Filled 2019-01-21: qty 100

## 2019-01-21 MED ORDER — PROPOFOL 10 MG/ML IV BOLUS
INTRAVENOUS | Status: DC | PRN
  Administered 2019-01-21: 40 mg via INTRAVENOUS
  Administered 2019-01-21: 160 mg via INTRAVENOUS

## 2019-01-21 MED ORDER — FENTANYL CITRATE (PF) 250 MCG/5ML IJ SOLN
INTRAMUSCULAR | Status: AC
  Filled 2019-01-21: qty 5

## 2019-01-21 MED ORDER — POLYETHYLENE GLYCOL 3350 17 G PO PACK
17.0000 g | PACK | Freq: Every day | ORAL | Status: DC | PRN
  Administered 2019-01-24: 17 g via ORAL
  Filled 2019-01-21: qty 1

## 2019-01-21 MED ORDER — MENTHOL 3 MG MT LOZG: 1.0000 | LOZENGE | OROMUCOSAL | Status: DC | PRN

## 2019-01-21 MED ORDER — ONDANSETRON HCL 4 MG/2ML IJ SOLN
INTRAMUSCULAR | Status: AC
  Filled 2019-01-21: qty 2

## 2019-01-21 MED ORDER — CITALOPRAM HYDROBROMIDE 20 MG PO TABS
60.0000 mg | ORAL_TABLET | Freq: Every day | ORAL | Status: DC
  Administered 2019-01-21 – 2019-01-23 (×3): 60 mg via ORAL
  Filled 2019-01-21 (×3): qty 3

## 2019-01-21 MED ORDER — FENTANYL CITRATE (PF) 100 MCG/2ML IJ SOLN
25.0000 ug | INTRAMUSCULAR | Status: DC | PRN
  Administered 2019-01-21 (×2): 50 ug via INTRAVENOUS

## 2019-01-21 MED ORDER — BUPIVACAINE HCL (PF) 0.5 % IJ SOLN
INTRAMUSCULAR | Status: DC | PRN
  Administered 2019-01-21: 5 mL

## 2019-01-21 MED ORDER — LOSARTAN POTASSIUM-HCTZ 50-12.5 MG PO TABS: 1.0000 | ORAL_TABLET | Freq: Every day | ORAL | Status: DC

## 2019-01-21 MED ORDER — DEXAMETHASONE SODIUM PHOSPHATE 10 MG/ML IJ SOLN
INTRAMUSCULAR | Status: DC | PRN
  Administered 2019-01-21: 10 mg via INTRAVENOUS

## 2019-01-21 MED ORDER — ATORVASTATIN CALCIUM 10 MG PO TABS
10.0000 mg | ORAL_TABLET | Freq: Every day | ORAL | Status: DC
  Administered 2019-01-21 – 2019-01-23 (×3): 10 mg via ORAL
  Filled 2019-01-21 (×3): qty 1

## 2019-01-21 MED ORDER — LIDOCAINE 2% (20 MG/ML) 5 ML SYRINGE
INTRAMUSCULAR | Status: DC | PRN
  Administered 2019-01-21: 80 mg via INTRAVENOUS

## 2019-01-21 MED ORDER — SODIUM CHLORIDE (PF) 0.9 % IJ SOLN
INTRAMUSCULAR | Status: AC
  Filled 2019-01-21: qty 20

## 2019-01-21 MED ORDER — METHOCARBAMOL 500 MG PO TABS
500.0000 mg | ORAL_TABLET | Freq: Four times a day (QID) | ORAL | Status: DC | PRN
  Administered 2019-01-22 – 2019-01-24 (×6): 500 mg via ORAL
  Filled 2019-01-21 (×6): qty 1

## 2019-01-21 MED ORDER — ALBUMIN HUMAN 5 % IV SOLN
INTRAVENOUS | Status: DC | PRN
  Administered 2019-01-21: 16:00:00 via INTRAVENOUS

## 2019-01-21 MED ORDER — HYDROCHLOROTHIAZIDE 12.5 MG PO CAPS
12.5000 mg | ORAL_CAPSULE | Freq: Every day | ORAL | Status: DC
  Administered 2019-01-21 – 2019-01-23 (×3): 12.5 mg via ORAL
  Filled 2019-01-21 (×3): qty 1

## 2019-01-21 MED ORDER — LIDOCAINE-EPINEPHRINE 1 %-1:100000 IJ SOLN
INTRAMUSCULAR | Status: AC
  Filled 2019-01-21: qty 1

## 2019-01-21 MED ORDER — ACETAMINOPHEN 650 MG RE SUPP: 650.0000 mg | RECTAL | Status: DC | PRN

## 2019-01-21 MED ORDER — PHENOL 1.4 % MT LIQD: 1.0000 | OROMUCOSAL | Status: DC | PRN

## 2019-01-21 MED ORDER — PHENYLEPHRINE 40 MCG/ML (10ML) SYRINGE FOR IV PUSH (FOR BLOOD PRESSURE SUPPORT)
PREFILLED_SYRINGE | INTRAVENOUS | Status: DC | PRN
  Administered 2019-01-21 (×3): 80 ug via INTRAVENOUS

## 2019-01-21 MED ORDER — FENTANYL CITRATE (PF) 100 MCG/2ML IJ SOLN
INTRAMUSCULAR | Status: AC
  Filled 2019-01-21: qty 2

## 2019-01-21 MED ORDER — ACETAMINOPHEN 500 MG PO TABS: 1000.0000 mg | ORAL_TABLET | Freq: Once | ORAL | Status: DC | PRN

## 2019-01-21 MED ORDER — MIDAZOLAM HCL 2 MG/2ML IJ SOLN
INTRAMUSCULAR | Status: AC
  Filled 2019-01-21: qty 2

## 2019-01-21 MED ORDER — ONDANSETRON HCL 4 MG PO TABS: 4.0000 mg | ORAL_TABLET | Freq: Four times a day (QID) | ORAL | Status: DC | PRN

## 2019-01-21 MED ORDER — LACTATED RINGERS IV SOLN
INTRAVENOUS | Status: DC
  Administered 2019-01-21 (×2): via INTRAVENOUS

## 2019-01-21 MED ORDER — SODIUM CHLORIDE 0.9 % IV SOLN
INTRAVENOUS | Status: DC | PRN
  Administered 2019-01-21: 20 ug/min via INTRAVENOUS

## 2019-01-21 MED ORDER — CEFAZOLIN SODIUM-DEXTROSE 2-4 GM/100ML-% IV SOLN
2.0000 g | Freq: Three times a day (TID) | INTRAVENOUS | Status: AC
  Administered 2019-01-21 – 2019-01-22 (×2): 2 g via INTRAVENOUS
  Filled 2019-01-21 (×2): qty 100

## 2019-01-21 MED ORDER — ONDANSETRON HCL 4 MG/2ML IJ SOLN: 4.0000 mg | Freq: Four times a day (QID) | INTRAMUSCULAR | Status: DC | PRN

## 2019-01-21 MED ORDER — ACETAMINOPHEN 325 MG PO TABS: 650.0000 mg | ORAL_TABLET | ORAL | Status: DC | PRN

## 2019-01-21 MED ORDER — ALUM & MAG HYDROXIDE-SIMETH 200-200-20 MG/5ML PO SUSP
30.0000 mL | Freq: Four times a day (QID) | ORAL | Status: DC | PRN
  Administered 2019-01-23 (×2): 30 mL via ORAL
  Filled 2019-01-21 (×2): qty 30

## 2019-01-21 MED ORDER — THROMBIN 5000 UNITS EX SOLR
OROMUCOSAL | Status: DC | PRN
  Administered 2019-01-21: 5 mL via TOPICAL

## 2019-01-21 MED ORDER — BUPIVACAINE HCL (PF) 0.5 % IJ SOLN
INTRAMUSCULAR | Status: AC
  Filled 2019-01-21: qty 30

## 2019-01-21 MED ORDER — MORPHINE SULFATE (PF) 2 MG/ML IV SOLN
2.0000 mg | INTRAVENOUS | Status: DC | PRN
  Administered 2019-01-22 – 2019-01-23 (×4): 4 mg via INTRAVENOUS
  Filled 2019-01-21: qty 1
  Filled 2019-01-21 (×2): qty 2
  Filled 2019-01-21: qty 1
  Filled 2019-01-21: qty 2

## 2019-01-21 MED ORDER — FENTANYL CITRATE (PF) 250 MCG/5ML IJ SOLN
INTRAMUSCULAR | Status: DC | PRN
  Administered 2019-01-21: 100 ug via INTRAVENOUS
  Administered 2019-01-21 (×6): 50 ug via INTRAVENOUS

## 2019-01-21 MED ORDER — LACTATED RINGERS IV SOLN: INTRAVENOUS | Status: DC

## 2019-01-21 MED ORDER — ROCURONIUM BROMIDE 10 MG/ML (PF) SYRINGE
PREFILLED_SYRINGE | INTRAVENOUS | Status: AC
  Filled 2019-01-21: qty 10

## 2019-01-21 MED ORDER — SUMATRIPTAN SUCCINATE 100 MG PO TABS
100.0000 mg | ORAL_TABLET | ORAL | Status: DC | PRN
  Filled 2019-01-21: qty 1

## 2019-01-21 MED ORDER — THROMBIN 20000 UNITS EX SOLR
CUTANEOUS | Status: AC
  Filled 2019-01-21: qty 20000

## 2019-01-21 MED ORDER — BISACODYL 10 MG RE SUPP: 10.0000 mg | Freq: Every day | RECTAL | Status: DC | PRN

## 2019-01-21 MED ORDER — SUGAMMADEX SODIUM 200 MG/2ML IV SOLN
INTRAVENOUS | Status: DC | PRN
  Administered 2019-01-21: 200 mg via INTRAVENOUS

## 2019-01-21 MED ORDER — HYDROMORPHONE HCL 1 MG/ML IJ SOLN
0.2500 mg | INTRAMUSCULAR | Status: DC | PRN
  Administered 2019-01-21 (×2): 0.25 mg via INTRAVENOUS

## 2019-01-21 MED ORDER — THROMBIN 20000 UNITS EX SOLR
CUTANEOUS | Status: DC | PRN
  Administered 2019-01-21: 20 mL via TOPICAL

## 2019-01-21 MED ORDER — METHOCARBAMOL 1000 MG/10ML IJ SOLN
500.0000 mg | Freq: Four times a day (QID) | INTRAVENOUS | Status: DC | PRN
  Administered 2019-01-22: 01:00:00 500 mg via INTRAVENOUS
  Filled 2019-01-21 (×2): qty 5

## 2019-01-21 MED ORDER — SODIUM CHLORIDE 0.9 % IV SOLN
250.0000 mL | INTRAVENOUS | Status: DC
  Administered 2019-01-21: 250 mL via INTRAVENOUS

## 2019-01-21 MED ORDER — LIDOCAINE 2% (20 MG/ML) 5 ML SYRINGE
INTRAMUSCULAR | Status: AC
  Filled 2019-01-21: qty 5

## 2019-01-21 MED ORDER — OXYCODONE HCL 5 MG PO TABS
ORAL_TABLET | ORAL | Status: AC
  Filled 2019-01-21: qty 1

## 2019-01-21 MED ORDER — FLEET ENEMA 7-19 GM/118ML RE ENEM: 1.0000 | ENEMA | Freq: Once | RECTAL | Status: DC | PRN

## 2019-01-21 MED ORDER — SODIUM CHLORIDE 0.9% FLUSH
3.0000 mL | Freq: Two times a day (BID) | INTRAVENOUS | Status: DC
  Administered 2019-01-21 – 2019-01-24 (×5): 3 mL via INTRAVENOUS

## 2019-01-21 MED ORDER — SENNA 8.6 MG PO TABS
1.0000 | ORAL_TABLET | Freq: Two times a day (BID) | ORAL | Status: DC
  Administered 2019-01-21 – 2019-01-24 (×6): 8.6 mg via ORAL
  Filled 2019-01-21 (×6): qty 1

## 2019-01-21 MED ORDER — HYDROMORPHONE HCL 1 MG/ML IJ SOLN
INTRAMUSCULAR | Status: AC
  Filled 2019-01-21: qty 1

## 2019-01-21 MED ORDER — OXYCODONE-ACETAMINOPHEN 5-325 MG PO TABS
1.0000 | ORAL_TABLET | ORAL | Status: DC | PRN
  Administered 2019-01-22 – 2019-01-24 (×12): 2 via ORAL
  Filled 2019-01-21 (×13): qty 2

## 2019-01-21 MED ORDER — MIDAZOLAM HCL 5 MG/5ML IJ SOLN
INTRAMUSCULAR | Status: DC | PRN
  Administered 2019-01-21: 2 mg via INTRAVENOUS

## 2019-01-21 MED ORDER — ACETAMINOPHEN 10 MG/ML IV SOLN: 1000.0000 mg | Freq: Once | INTRAVENOUS | Status: DC | PRN

## 2019-01-21 MED ORDER — SUCCINYLCHOLINE CHLORIDE 200 MG/10ML IV SOSY
PREFILLED_SYRINGE | INTRAVENOUS | Status: DC | PRN
  Administered 2019-01-21: 120 mg via INTRAVENOUS

## 2019-01-21 MED ORDER — AMPHETAMINE-DEXTROAMPHETAMINE 10 MG PO TABS
30.0000 mg | ORAL_TABLET | Freq: Every day | ORAL | Status: DC
  Administered 2019-01-22 – 2019-01-24 (×3): 30 mg via ORAL
  Filled 2019-01-21 (×3): qty 3

## 2019-01-21 MED ORDER — LOSARTAN POTASSIUM 50 MG PO TABS
50.0000 mg | ORAL_TABLET | Freq: Every day | ORAL | Status: DC
  Administered 2019-01-21 – 2019-01-23 (×3): 50 mg via ORAL
  Filled 2019-01-21 (×3): qty 1

## 2019-01-21 MED ORDER — ACETAMINOPHEN 10 MG/ML IV SOLN
INTRAVENOUS | Status: DC | PRN
  Administered 2019-01-21: 1000 mg via INTRAVENOUS

## 2019-01-21 MED ORDER — ACETAMINOPHEN 160 MG/5ML PO SOLN: 1000.0000 mg | Freq: Once | ORAL | Status: DC | PRN

## 2019-01-21 MED ORDER — PANTOPRAZOLE SODIUM 40 MG PO TBEC
40.0000 mg | DELAYED_RELEASE_TABLET | Freq: Every day | ORAL | Status: DC
  Administered 2019-01-21 – 2019-01-23 (×3): 40 mg via ORAL
  Filled 2019-01-21 (×3): qty 1

## 2019-01-21 MED ORDER — DOCUSATE SODIUM 100 MG PO CAPS
100.0000 mg | ORAL_CAPSULE | Freq: Two times a day (BID) | ORAL | Status: DC
  Administered 2019-01-22 – 2019-01-24 (×5): 100 mg via ORAL
  Filled 2019-01-21 (×5): qty 1

## 2019-01-21 MED ORDER — ONDANSETRON HCL 4 MG/2ML IJ SOLN
INTRAMUSCULAR | Status: DC | PRN
  Administered 2019-01-21: 4 mg via INTRAVENOUS

## 2019-01-21 MED ORDER — OXYCODONE HCL 5 MG PO TABS
5.0000 mg | ORAL_TABLET | Freq: Once | ORAL | Status: AC | PRN
  Administered 2019-01-21: 5 mg via ORAL

## 2019-01-21 MED ORDER — ROCURONIUM BROMIDE 10 MG/ML (PF) SYRINGE
PREFILLED_SYRINGE | INTRAVENOUS | Status: DC | PRN
  Administered 2019-01-21: 50 mg via INTRAVENOUS

## 2019-01-21 MED ORDER — KETOROLAC TROMETHAMINE 15 MG/ML IJ SOLN
15.0000 mg | Freq: Four times a day (QID) | INTRAMUSCULAR | Status: AC
  Administered 2019-01-21 – 2019-01-22 (×3): 15 mg via INTRAVENOUS
  Filled 2019-01-21 (×3): qty 1

## 2019-01-21 MED ORDER — FUROSEMIDE 20 MG PO TABS: 20.0000 mg | ORAL_TABLET | Freq: Every day | ORAL | Status: DC | PRN

## 2019-01-21 SURGICAL SUPPLY — 68 items
ADH SKN CLS APL DERMABOND .7 (GAUZE/BANDAGES/DRESSINGS) ×1
APL SRG 60D 8 XTD TIP BNDBL (TIP)
BAG DECANTER FOR FLEXI CONT (MISCELLANEOUS) ×2 IMPLANT
BASKET BONE COLLECTION (BASKET) ×2 IMPLANT
BLADE CLIPPER SURG (BLADE) IMPLANT
BONE CANC CHIPS 40CC CAN1/2 (Bone Implant) ×2 IMPLANT
BUR MATCHSTICK NEURO 3.0 LAGG (BURR) ×2 IMPLANT
CAGE COROENT MP 8X9X23M-8 SPIN (Cage) ×2 IMPLANT
CANISTER SUCT 3000ML PPV (MISCELLANEOUS) ×2 IMPLANT
CHIPS CANC BONE 40CC CAN1/2 (Bone Implant) ×1 IMPLANT
CONT SPEC 4OZ CLIKSEAL STRL BL (MISCELLANEOUS) ×2 IMPLANT
COVER BACK TABLE 60X90IN (DRAPES) ×2 IMPLANT
COVER WAND RF STERILE (DRAPES) ×1 IMPLANT
DECANTER SPIKE VIAL GLASS SM (MISCELLANEOUS) ×2 IMPLANT
DERMABOND ADVANCED (GAUZE/BANDAGES/DRESSINGS) ×1
DERMABOND ADVANCED .7 DNX12 (GAUZE/BANDAGES/DRESSINGS) ×1 IMPLANT
DEVICE DISSECT PLASMABLAD 3.0S (MISCELLANEOUS) ×1 IMPLANT
DRAPE C-ARM 42X72 X-RAY (DRAPES) ×3 IMPLANT
DRAPE HALF SHEET 40X57 (DRAPES) ×1 IMPLANT
DRAPE LAPAROTOMY 100X72X124 (DRAPES) ×2 IMPLANT
DRSG OPSITE POSTOP 4X6 (GAUZE/BANDAGES/DRESSINGS) ×1 IMPLANT
DURAPREP 26ML APPLICATOR (WOUND CARE) ×2 IMPLANT
DURASEAL APPLICATOR TIP (TIP) IMPLANT
DURASEAL SPINE SEALANT 3ML (MISCELLANEOUS) IMPLANT
ELECT REM PT RETURN 9FT ADLT (ELECTROSURGICAL) ×2
ELECTRODE REM PT RTRN 9FT ADLT (ELECTROSURGICAL) ×1 IMPLANT
GAUZE 4X4 16PLY RFD (DISPOSABLE) IMPLANT
GAUZE SPONGE 4X4 12PLY STRL (GAUZE/BANDAGES/DRESSINGS) ×2 IMPLANT
GLOVE BIO SURGEON STRL SZ 6.5 (GLOVE) ×3 IMPLANT
GLOVE BIOGEL PI IND STRL 7.5 (GLOVE) IMPLANT
GLOVE BIOGEL PI IND STRL 8.5 (GLOVE) ×2 IMPLANT
GLOVE BIOGEL PI INDICATOR 7.5 (GLOVE) ×2
GLOVE BIOGEL PI INDICATOR 8.5 (GLOVE) ×2
GLOVE ECLIPSE 8.5 STRL (GLOVE) ×5 IMPLANT
GLOVE INDICATOR 7.5 STRL GRN (GLOVE) ×1 IMPLANT
GLOVE SURG SS PI 7.0 STRL IVOR (GLOVE) ×3 IMPLANT
GLOVE SURG SS PI 8.0 STRL IVOR (GLOVE) ×1 IMPLANT
GOWN STRL REUS W/ TWL LRG LVL3 (GOWN DISPOSABLE) IMPLANT
GOWN STRL REUS W/ TWL XL LVL3 (GOWN DISPOSABLE) IMPLANT
GOWN STRL REUS W/TWL 2XL LVL3 (GOWN DISPOSABLE) ×4 IMPLANT
GOWN STRL REUS W/TWL LRG LVL3 (GOWN DISPOSABLE) ×6
GOWN STRL REUS W/TWL XL LVL3 (GOWN DISPOSABLE) ×2
GRAFT BNE CHIP CANC 1-8 40 (Bone Implant) IMPLANT
HEMOSTAT POWDER KIT SURGIFOAM (HEMOSTASIS) ×1 IMPLANT
KIT BASIN OR (CUSTOM PROCEDURE TRAY) ×2 IMPLANT
KIT TURNOVER KIT B (KITS) ×2 IMPLANT
MILL MEDIUM DISP (BLADE) ×2 IMPLANT
NEEDLE HYPO 22GX1.5 SAFETY (NEEDLE) ×2 IMPLANT
NS IRRIG 1000ML POUR BTL (IV SOLUTION) ×2 IMPLANT
PACK LAMINECTOMY NEURO (CUSTOM PROCEDURE TRAY) ×2 IMPLANT
PAD ARMBOARD 7.5X6 YLW CONV (MISCELLANEOUS) ×6 IMPLANT
PATTIES SURGICAL .5 X1 (DISPOSABLE) ×2 IMPLANT
PLASMABLADE 3.0S (MISCELLANEOUS) ×2
ROD RELINE-O LORD 5.5X35MM (Rod) ×2 IMPLANT
SCREW LOCK RELINE 5.5 TULIP (Screw) ×4 IMPLANT
SCREW RELINE-O POLY 6.5X45 (Screw) ×4 IMPLANT
SPONGE LAP 4X18 RFD (DISPOSABLE) IMPLANT
SPONGE SURGIFOAM ABS GEL 100 (HEMOSTASIS) ×2 IMPLANT
SUT PROLENE 6 0 BV (SUTURE) IMPLANT
SUT VIC AB 1 CT1 18XBRD ANBCTR (SUTURE) ×1 IMPLANT
SUT VIC AB 1 CT1 8-18 (SUTURE) ×2
SUT VIC AB 2-0 CP2 18 (SUTURE) ×2 IMPLANT
SUT VIC AB 3-0 SH 8-18 (SUTURE) ×3 IMPLANT
SYR 3ML LL SCALE MARK (SYRINGE) ×4 IMPLANT
TOWEL GREEN STERILE (TOWEL DISPOSABLE) ×2 IMPLANT
TOWEL GREEN STERILE FF (TOWEL DISPOSABLE) ×2 IMPLANT
TRAY FOLEY MTR SLVR 16FR STAT (SET/KITS/TRAYS/PACK) ×2 IMPLANT
WATER STERILE IRR 1000ML POUR (IV SOLUTION) ×2 IMPLANT

## 2019-01-21 NOTE — Telephone Encounter (Signed)
Called patient and left a detailed voice message to see how many of this medication she was taking.  OK for [facility] to discuss and advise to patient.  CRM Created.

## 2019-01-21 NOTE — Op Note (Signed)
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  Pedicle screw fixation L5-S1 posterior lateral arthrodesis with local autograft and allograft. Surgeon: Kristeen Miss First Assistant: Newman Pies, MD Anesthesia: General endotracheal Indications: [name] Sherman is a 58 year old individual who has had a significant chronic radiculopathy after having had a discectomy nearly a year ago.  She had early evidence of recurrence and was advised that she should undergo surgical decompression and stabilization at L5-S1 because of significant degenerative processes in the disc space.  After undergoing extensive efforts at conservative therapy and failing these she was approved to undergo surgical decompression stabilization at L5-S1.  Procedure: Patient was brought to the operating room supine on the stretcher after the smooth induction of general endotracheal anesthesia she was carefully turned prone.  The back was prepped with alcohol DuraPrep and draped in a sterile fashion.  Previously made lumbar incision for the discectomy was reopened and extended cephalad and caudad.  Dissection was taken down to the lumbodorsal fascia which was opened on either side of midline to expose the interlaminar space at L5-S1.  Radiographic confirmation of the space was obtained.  Then large laminotomies were created on the left and on the right remove the inferior margin lamina of L5 out to and including the bulk of the facet joint at the L5-S1 joint.  This allowed exposure of the common dural tube and the path of the S1 nerve root inferiorly and the L5  nerve root superiorly.  On the right side there is been the evidence of the previous discectomy and significant scar tissue because adherence of the dura this was carefully dissected without encountering any CSF leaks and underneath this a significant quantity of severely degenerated desiccated disc material with bony osteophytic formation that had formed from the superior margin of the vertebral body of S1 was encountered as this was removed and the dissection was carried out further to enter the disc space and remove moderate quantity of severely degenerated desiccated disc material this space was evacuated of all this material and once evacuation was completed interbody spacer could be placed to enlarge this opening to a 9 mm height maximally.  The contralateral side was decompressed in a similar fashion the endplates were curettaged to remove all endplate material and good bony grafting surfaces were encountered then by mixing the autologous bone that had been harvested from the laminectomies with 40 cc of cortical cancellus croutons the interspace was filled with a total of 9 cc of bone graft and 2 cages.  Then attention was turned to placing pedicle screws at L5 and S1 and this was done with fluoroscopic guidance placing 6.5 x 45 mm screws and using precontoured 35 mm rods to connect these together prior to doing so lateral gutters that is the intertransverse space from L5 to the sacral ala was cleaned and decorticated and packed with approximately 6 more cc of the bone graft mixture.  Then the rods were placed and connected in a neutral construct.  Once all was checked radiographically the laminotomy site was inspected visually to make sure that good decompression of the L5 and S1 nerve roots was achieved no leaks were noted and then final closure was performed  with #1 Vicryl and the lumbodorsal fascia 2-0 Vicryl subcutaneous tissues 3-0 Vicryl subcuticularly and Dermabond on the skin blood loss for the  procedure was estimated 250 cc and 20 cc of half percent Marcaine solution was injected into the paraspinous fascia.

## 2019-01-21 NOTE — Transfer of Care (Signed)
Immediate Anesthesia Transfer of Care Note  Patient: Valentina Alcoser Tribbey  Procedure(s) Performed: Lumbar five Sacral one Posterior lumbar interbody fusion (N/A Back)  Patient Location: PACU  Anesthesia Type:General  Level of Consciousness: awake  Airway & Oxygen Therapy: Patient Spontanous Breathing and Patient connected to nasal cannula oxygen  Post-op Assessment: Report given to RN and Post -op Vital signs reviewed and stable  Post vital signs: Reviewed and stable  Last Vitals:  Vitals Value Taken Time  BP 118/66 01/21/19 1734  Temp    Pulse 91 01/21/19 1736  Resp 19 01/21/19 1736  SpO2 95 % 01/21/19 1736  Vitals shown include unvalidated device data.  Last Pain:  Vitals:   01/21/19 1031  TempSrc:   PainSc: 6       Patients Stated Pain Goal: 2 (80/16/55 3748)  Complications: No apparent anesthesia complications

## 2019-01-21 NOTE — H&P (Signed)
Date of admission: 01/21/2019 Chief complaint: Back and right leg pain and weakness. History of present illness: [name] Sherman is a 58 year old individuals had significant back and right leg pain now weakness in the right and the left leg.  She has had a herniated nucleus pulposus at L5-S1 that was operated on a year ago.  There is also a synovial cyst that was resected.  She is developed worsening degenerative changes and severe spondylitic stenosis and after careful evaluation was advised regarding the need for surgical decompression and arthrodesis at L5-S1.  She is now admitted for this procedure.  In preparation for the procedure because there had been so much time that it passed from her initial symptoms and her symptoms had worsened considerably an MRI was repeated on 21 July.  After giving contrast it is clearly evident that there is still significant inflammatory process at the L5-S1 space with right-sided nerve root cut off and left-sided foraminal stenosis.  Patient also has some adjacent level disease at L2 334 and 4 5 but is not felt that these are clinically significant to the current situation.  Past medical history reveals the patient's general health has been fair.  She does suffer from morbid obesity and has been considered for weight control surgery.  System review reveals of the side from the items in history of present illness patient has generally had good health.  Physical examination reveals that the patient is morbidly obese.  She stands and walks with significant antalgia.  There is weakness in the tibialis anterior on the right at 4+ out of 5 weakness in the tibialis anterior on the left 4 out of 5.  Sensation is decreased in the dorsal aspect of the right leg.  Gastroc reflexes are absent bilaterally patellar reflexes are 1+.  Straight leg raising is positive on right side at 15 degrees positive on left side at 60 degrees.  Patrick's maneuver is negative bilaterally.  Upper  extremity strength and reflexes are normal.  Cranial nerve examination is normal.  Lungs are clear to auscultation her heart has a regular rate and rhythm the abdomen is soft bowel sounds are positive no masses are noted extremities reveal no cyanosis clubbing or edema.  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  Having failed efforts at conservative management she has been advised regarding surgical decompression and stabilization at L5-S1 via posterior lumbar interbody technique.

## 2019-01-21 NOTE — Anesthesia Procedure Notes (Signed)
Procedure Name: Intubation Date/Time: 01/21/2019 1:23 PM Performed by: Renato Shin, CRNA Pre-anesthesia Checklist: Patient identified, Emergency Drugs available, Suction available and Patient being monitored Patient Re-evaluated:Patient Re-evaluated prior to induction Oxygen Delivery Method: Circle system utilized Preoxygenation: Pre-oxygenation with 100% oxygen Induction Type: IV induction Ventilation: Mask ventilation without difficulty Laryngoscope Size: Miller and 2 Grade View: Grade II Tube type: Oral Tube size: 7.0 mm Number of attempts: 2 Airway Equipment and Method: Stylet and Oral airway Placement Confirmation: ETT inserted through vocal cords under direct vision,  positive ETCO2 and breath sounds checked- equal and bilateral Secured at: 21 cm Tube secured with: Tape Dental Injury: Teeth and Oropharynx as per pre-operative assessment

## 2019-01-21 NOTE — Anesthesia Preprocedure Evaluation (Signed)
Anesthesia Evaluation  Patient identified by MRN, date of birth, ID band Patient awake    Reviewed: Allergy & Precautions, NPO status , Patient's Chart, lab work & pertinent test results  History of Anesthesia Complications Negative for: history of anesthetic complications  Airway Mallampati: III  TM Distance: >3 FB Neck ROM: Full    Dental  (+) Teeth Intact, Dental Advisory Given   Pulmonary sleep apnea ,    breath sounds clear to auscultation       Cardiovascular hypertension, Pt. on medications (-) angina(-) Past MI and (-) CHF  Rhythm:Regular     Neuro/Psych  Headaches, PSYCHIATRIC DISORDERS Anxiety Depression    GI/Hepatic GERD  Medicated,  Endo/Other  Hypothyroidism Morbid obesity  Renal/GU Renal disease     Musculoskeletal  (+) Arthritis ,   Abdominal   Peds  Hematology  (+) anemia ,   Anesthesia Other Findings   Reproductive/Obstetrics                             Anesthesia Physical Anesthesia Plan  ASA: III  Anesthesia Plan: General   Post-op Pain Management:    Induction: Intravenous  PONV Risk Score and Plan: 3 and Ondansetron and Dexamethasone  Airway Management Planned: Oral ETT  Additional Equipment: None  Intra-op Plan:   Post-operative Plan: Extubation in OR  Informed Consent: I have reviewed the patients History and Physical, chart, labs and discussed the procedure including the risks, benefits and alternatives for the proposed anesthesia with the patient or authorized representative who has indicated his/her understanding and acceptance.     Dental advisory given  Plan Discussed with: CRNA and Surgeon  Anesthesia Plan Comments:         Anesthesia Quick Evaluation

## 2019-01-22 LAB — CBC
HCT: 31 % — ABNORMAL LOW (ref 36.0–46.0)
Hemoglobin: 9.7 g/dL — ABNORMAL LOW (ref 12.0–15.0)
MCH: 26.4 pg (ref 26.0–34.0)
MCHC: 31.3 g/dL (ref 30.0–36.0)
MCV: 84.2 fL (ref 80.0–100.0)
Platelets: 290 10*3/uL (ref 150–400)
RBC: 3.68 MIL/uL — ABNORMAL LOW (ref 3.87–5.11)
RDW: 12.8 % (ref 11.5–15.5)
WBC: 13.2 10*3/uL — ABNORMAL HIGH (ref 4.0–10.5)
nRBC: 0 % (ref 0.0–0.2)

## 2019-01-22 LAB — BASIC METABOLIC PANEL
Anion gap: 8 (ref 5–15)
BUN: 18 mg/dL (ref 6–20)
CO2: 26 mmol/L (ref 22–32)
Calcium: 8.5 mg/dL — ABNORMAL LOW (ref 8.9–10.3)
Chloride: 104 mmol/L (ref 98–111)
Creatinine, Ser: 1.04 mg/dL — ABNORMAL HIGH (ref 0.44–1.00)
GFR calc Af Amer: >60 mL/min (ref >60)
GFR calc non Af Amer: >60 mL/min (ref >60)
Glucose, Bld: 156 mg/dL — ABNORMAL HIGH (ref 70–99)
Potassium: 3.9 mmol/L (ref 3.5–5.1)
Sodium: 138 mmol/L (ref 135–145)

## 2019-01-22 NOTE — Progress Notes (Signed)
   CSW acknowledges consult for SNF placement. Will follow for therapy recommendations.   Thurmond Butts, MSW, [facility] Clinical Social Worker [phone]

## 2019-01-22 NOTE — Evaluation (Signed)
Physical Therapy Evaluation Patient Details Name: [name] Sherman MRN: [id number] DOB: 12/17/1960 Today's Date: 01/22/2019   History of Present Illness  58 yo female s/p BIL decompression L5-S1 with posterior lumbar interbody arthrodesis using local autograft and allograft placement cages . Pedicle screw fixation L5-s1 posterior lateral arthrodesis with local autograft and allograft PMH: morbid obesity   Clinical Impression  Patient is s/p above surgery resulting in the deficits listed below (see PT Problem List). Pt mobilizing well. Patient will benefit from skilled PT to increase their independence and safety with mobility (while adhering to their precautions) to allow discharge to the venue listed below.     Follow Up Recommendations No PT follow up;Supervision - Intermittent    Equipment Recommendations  Rolling walker with 5" wheels    Recommendations for Other Services       Precautions / Restrictions Precautions Precautions: Back Precaution Booklet Issued: Yes (comment) Precaution Comments: back handout provided and reviewed for adls. pt able to state precautions, pt with good carry over Required Braces or Orthoses: Spinal Brace Restrictions Weight Bearing Restrictions: No      Mobility  Bed Mobility Overal bed mobility: Modified Independent             General bed mobility comments: min cues for back precautions with hoB >45 dgrees  Transfers Overall transfer level: Needs assistance Equipment used: Rolling walker (2 wheeled) Transfers: Sit to/from Stand Sit to Stand: Min guard         General transfer comment: pt requires cues to scoot to the EOB and pulling up on RW. pt instructed on proper hand placement  Ambulation/Gait Ambulation/Gait assistance: Supervision Gait Distance (Feet): 150 Feet Assistive device: Rolling walker (2 wheeled) Gait Pattern/deviations: Step-through pattern;Decreased stride length Gait velocity: slow   General Gait Details:  pt with freq standing rest stops with increased UE dependence but no episode of LOB  Stairs            Wheelchair Mobility    Modified Rankin (Stroke Patients Only)       Balance Overall balance assessment: Needs assistance Sitting-balance support: No upper extremity supported;Feet supported Sitting balance-Leahy Scale: Good     Standing balance support: Bilateral upper extremity supported;During functional activity Standing balance-Leahy Scale: Fair                               Pertinent Vitals/Pain Pain Assessment: 0-10 Pain Score: 5  Faces Pain Scale: Hurts even more Pain Location: back Pain Descriptors / Indicators: Operative site guarding Pain Intervention(s): Monitored during session    Home Living Family/patient expects to be discharged to:: Private residence Living Arrangements: Spouse/significant other Available Help at Discharge: Family Type of Home: House Home Access: Level entry     Home Layout: One level Home Equipment: Electronics engineer Comments: lives with elderly father and spouse working from home    Prior Function Level of Independence: Needs assistance   Gait / Transfers Assistance Needed: no AD  ADL's / Homemaking Assistance Needed: spouse helping with LB bathing and dressing. Spouse performs peri care after bowel movement prior to admission        Hand Dominance   Dominant Hand: Right    Extremity/Trunk Assessment   Upper Extremity Assessment Upper Extremity Assessment: Overall WFL for tasks assessed    Lower Extremity Assessment Lower Extremity Assessment: Overall WFL for tasks assessed    Cervical / Trunk Assessment Cervical / Trunk Assessment: Other exceptions(s/p  srug) Cervical / Trunk Exceptions: back surgery  Communication   Communication: No difficulties  Cognition Arousal/Alertness: Awake/alert Behavior During Therapy: WFL for tasks assessed/performed Overall Cognitive Status: Within Functional  Limits for tasks assessed                                        General Comments General comments (skin integrity, edema, etc.): incision with honeycomb on with minimal drainage    Exercises     Assessment/Plan    PT Assessment Patient needs continued PT services  PT Problem List Decreased strength;Decreased range of motion;Decreased activity tolerance;Decreased balance;Decreased mobility;Decreased knowledge of use of DME;Decreased knowledge of precautions       PT Treatment Interventions DME instruction;Gait training;Stair training;Functional mobility training;Therapeutic activities;Therapeutic exercise;Balance training;Neuromuscular re-education    PT Goals (Current goals can be found in the Care Plan section)  Acute Rehab PT Goals Patient Stated Goal: to be able to go shopping again. i just havent because this pain has been so bad PT Goal Formulation: With patient Time For Goal Achievement: 02/05/19 Potential to Achieve Goals: Good    Frequency Min 5X/week   Barriers to discharge        Co-evaluation               AM-PAC PT "6 Clicks" Mobility  Outcome Measure Help needed turning from your back to your side while in a flat bed without using bedrails?: A Little Help needed moving from lying on your back to sitting on the side of a flat bed without using bedrails?: A Little Help needed moving to and from a bed to a chair (including a wheelchair)?: A Little Help needed standing up from a chair using your arms (e.g., wheelchair or bedside chair)?: A Little Help needed to walk in hospital room?: A Little Help needed climbing 3-5 steps with a railing? : A Little 6 Click Score: 18    End of Session Equipment Utilized During Treatment: Back brace Activity Tolerance: Patient tolerated treatment well Patient left: in chair;with call bell/phone within reach Nurse Communication: Mobility status PT Visit Diagnosis: Unsteadiness on feet (R26.81);Pain Pain  - part of body: (back)    Time: 5885-0277 PT Time Calculation (min) (ACUTE ONLY): 12 min   Charges:   PT Evaluation $PT Eval Moderate Complexity: 1 Mod          Kittie Plater, PT, DPT Acute Rehabilitation Services Pager #: [phone] Office #: [phone]   Berline Lopes 01/22/2019, 2:14 PM

## 2019-01-22 NOTE — Telephone Encounter (Signed)
Refill at 3 daily for one year.

## 2019-01-22 NOTE — Progress Notes (Signed)
Vital signs are stable Postoperative hemoglobin is stable though decreased and consistent with acute blood loss anemia. Patient is ambulatory Note some proximal numbness in her thighs but her distal lower extremities appear to be functioning well Incision is clean and dry.

## 2019-01-22 NOTE — Evaluation (Signed)
Occupational Therapy Evaluation Patient Details Name: [name] Sherman MRN: [id number] DOB: 1960-11-15 Today's Date: 01/22/2019    History of Present Illness 58 yo female s/p BIL decompression L5-S1 with posterior lumbar interbody arthrodesis using local autograft and allograft placement cages . Pedicle screw fixation L5-s1 posterior lateral arthrodesis with local autograft and allograft PMH: morbid obesity    Clinical Impression   Patient is s/p L5-s1 PLIF surgery resulting in functional limitations due to the deficits listed below (see OT problem list). Pt currently with balance deficits and pain for tub transfer. Recommending sponge bath only at this time until pt can tolerate the transfer. Pt will have family (A) upon dc/ Patient will benefit from skilled OT acutely to increase independence and safety with ADLS to allow discharge home with family.     Follow Up Recommendations  No OT follow up    Equipment Recommendations  None recommended by OT    Recommendations for Other Services       Precautions / Restrictions Precautions Precautions: Back Precaution Comments: back handout provided and reviewed for adls. pt able to state precautions      Mobility Bed Mobility Overal bed mobility: Modified Independent             General bed mobility comments: min cues for back precautions with hoB >45 dgrees  Transfers Overall transfer level: Needs assistance Equipment used: Rolling walker (2 wheeled) Transfers: Sit to/from Stand Sit to Stand: Min guard         General transfer comment: pt requires cues to scoot to the EOB and pulling up on RW. pt instructed on proper hand placement    Balance Overall balance assessment: Needs assistance Sitting-balance support: No upper extremity supported;Feet supported Sitting balance-Leahy Scale: Good     Standing balance support: Bilateral upper extremity supported;During functional activity Standing balance-Leahy Scale: Fair                              ADL either performed or assessed with clinical judgement   ADL Overall ADL's : Needs assistance/impaired Eating/Feeding: Independent   Grooming: Wash/dry hands;Set up;Standing   Upper Body Bathing: Modified independent;Sitting Upper Body Bathing Details (indicate cue type and reason): requires sitting Lower Body Bathing: Moderate assistance;Sit to/from stand   Upper Body Dressing : Modified independent;Sitting   Lower Body Dressing: Min guard Lower Body Dressing Details (indicate cue type and reason): pt able to hook bil LE into pants at this time. pt reports spouse (A). pt don underwear and pj pants.  Toilet Transfer: Minimal Print production planner Details (indicate cue type and reason): pt requires use of bil UE on RW to power up          Functional mobility during ADLs: Min guard;Rolling walker General ADL Comments: pt requires rest breaks due to back pain. pt has a Secondary school teacher at home     Vision Baseline Vision/History: Wears glasses Wears Glasses: Reading only       Perception     Praxis      Pertinent Vitals/Pain Pain Assessment: Faces Faces Pain Scale: Hurts even more Pain Location: back Pain Descriptors / Indicators: Operative site guarding Pain Intervention(s): Monitored during session;Premedicated before session;Repositioned     Hand Dominance Right   Extremity/Trunk Assessment Upper Extremity Assessment Upper Extremity Assessment: Overall WFL for tasks assessed   Lower Extremity Assessment Lower Extremity Assessment: Defer to PT evaluation   Cervical / Trunk Assessment Cervical / Trunk Assessment: Other  exceptions(s/p srug)   Communication Communication Communication: No difficulties   Cognition Arousal/Alertness: Awake/alert Behavior During Therapy: WFL for tasks assessed/performed Overall Cognitive Status: Within Functional Limits for tasks assessed                                      General Comments  incision dry and intact    Exercises     Shoulder Instructions      Home Living Family/patient expects to be discharged to:: Private residence Living Arrangements: Spouse/significant other Available Help at Discharge: Family Type of Home: House Home Access: Level entry     Home Layout: One level     Bathroom Shower/Tub: Teacher, early years/pre: Standard     Home Equipment: Building services engineer Comments: lives with elderly father and spouse working from home      Prior Functioning/Environment Level of Independence: Needs assistance    ADL's / Homemaking Assistance Needed: spouse helping with LB bathing and dressing. Spouse performs peri care after bowel movement prior to admission            OT Problem List: Decreased activity tolerance;Decreased knowledge of precautions;Decreased knowledge of use of DME or AE;Decreased safety awareness;Obesity;Pain      OT Treatment/Interventions: Self-care/ADL training;Therapeutic activities;Therapeutic exercise;DME and/or AE instruction;Patient/family education;Balance training    OT Goals(Current goals can be found in the care plan section) Acute Rehab OT Goals Patient Stated Goal: to be able to go shopping again. i just havent because this pain has been so bad OT Goal Formulation: With patient Time For Goal Achievement: 02/05/19 Potential to Achieve Goals: Good  OT Frequency: Min 3X/week   Barriers to D/C:            Co-evaluation              AM-PAC OT "6 Clicks" Daily Activity     Outcome Measure Help from another person eating meals?: None Help from another person taking care of personal grooming?: None Help from another person toileting, which includes using toliet, bedpan, or urinal?: A Little Help from another person bathing (including washing, rinsing, drying)?: A Little Help from another person to put on and taking off regular upper body clothing?: A Little Help from  another person to put on and taking off regular lower body clothing?: A Lot 6 Click Score: 19   End of Session Equipment Utilized During Treatment: Rolling walker;Back brace Nurse Communication: Mobility status;Precautions  Activity Tolerance: Patient tolerated treatment well Patient left: in chair;with call bell/phone within reach  OT Visit Diagnosis: Unsteadiness on feet (R26.81)                Time: 2595-6387 OT Time Calculation (min): 14 min Charges:  OT General Charges $OT Visit: 1 Visit OT Evaluation $OT Eval Moderate Complexity: 1 Mod   Jeri Modena, OTR/L  Acute Rehabilitation Services Pager: [phone] Office: [phone] .   Jeri Modena 01/22/2019, 12:16 PM

## 2019-01-22 NOTE — Anesthesia Postprocedure Evaluation (Signed)
Anesthesia Post Note  Patient: [name] Sherman  Procedure(s) Performed: Lumbar five Sacral one Posterior lumbar interbody fusion (N/A Back)     Patient location during evaluation: PACU Anesthesia Type: General Level of consciousness: awake and alert Pain management: pain level controlled Vital Signs Assessment: post-procedure vital signs reviewed and stable Respiratory status: spontaneous breathing, nonlabored ventilation, respiratory function stable and patient connected to nasal cannula oxygen Cardiovascular status: blood pressure returned to baseline and stable Postop Assessment: no apparent nausea or vomiting Anesthetic complications: no    Last Vitals:  Vitals:   01/22/19 0340 01/22/19 0918  BP: 101/64 114/61  Pulse: 75 71  Resp: 20 20  Temp: 36.5 C (!) 36.4 C  SpO2: 92% 93%    Last Pain:  Vitals:   01/22/19 0918  TempSrc: Oral  PainSc:                  Lajarvis Italiano

## 2019-01-22 NOTE — Progress Notes (Signed)
Orthopedic Tech Progress Note Patient Details:  [name] Sherman 10-Jan-1961 [id number] Patient brace is in room on counter top Patient ID: [name] Sherman, female   DOB: June 25, 1961, 58 y.o.   MRN: [id number]   [name] Sherman 01/22/2019, 8:41 AM

## 2019-01-22 NOTE — Progress Notes (Signed)
Pt been up in chair, walked to bathroom, steady gait. Back to bed with assist, at this time pt noted her upper thighs closer to groin area that she had numbness. She states " I can't feel me scratching this area", on both sides.  Assessment earlier this morning pt did not note that this area had numbness.  Pt was able to walk with no problem.  Instructed pt to mention to Dr Ellene Route when he rounds.

## 2019-01-22 NOTE — Telephone Encounter (Signed)
Called patient and she stated that she is taking 3 Celexa daily at 60mg  a day. Patient stated that she has been taking this for years and it is to help keep her migraines in check and another doctor prescribed to her a long time ago and she stated that she has already discussed this with you.  Please advise if OK to continue.

## 2019-01-23 ENCOUNTER — Inpatient Hospital Stay (HOSPITAL_COMMUNITY): Payer: 59

## 2019-01-23 MED ORDER — DEXAMETHASONE 4 MG PO TABS
4.0000 mg | ORAL_TABLET | Freq: Two times a day (BID) | ORAL | Status: DC
  Administered 2019-01-23 – 2019-01-24 (×3): 4 mg via ORAL
  Filled 2019-01-23 (×3): qty 1

## 2019-01-23 MED ORDER — GABAPENTIN 300 MG PO CAPS
300.0000 mg | ORAL_CAPSULE | Freq: Three times a day (TID) | ORAL | Status: DC
  Administered 2019-01-23 – 2019-01-24 (×4): 300 mg via ORAL
  Filled 2019-01-23 (×4): qty 1

## 2019-01-23 NOTE — Progress Notes (Signed)
Vital signs are stable Patient experiencing significant left buttock and hip pain not noted yesterday Incision is clean and dry and motor function appears good however pain is acute and sharp in her left hip. We will start gabapentin for pain control CT lumbar spine ordered Monitor in hospital.

## 2019-01-23 NOTE — Progress Notes (Signed)
Physical Therapy Treatment Patient Details Name: [name] Sherman MRN: [id number] DOB: 12-Aug-1960 Today's Date: 01/23/2019    History of Present Illness 58 yo female s/p BIL decompression L5-S1 with posterior lumbar interbody arthrodesis using local autograft and allograft placement cages . Pedicle screw fixation L5-s1 posterior lateral arthrodesis with local autograft and allograft PMH: morbid obesity     PT Comments    Pt with 10/10 L radiating burning pain from low back, down butt, to front of thigh. RN and Dr. Ellene Route aware. Pain limiting ambulation and mobility today. Acute PT to cont to follow.    Follow Up Recommendations  No PT follow up;Supervision - Intermittent     Equipment Recommendations  Rolling walker with 5" wheels    Recommendations for Other Services       Precautions / Restrictions Precautions Precautions: Back Precaution Booklet Issued: Yes (comment) Precaution Comments: reviewed back precautions Required Braces or Orthoses: Spinal Brace Restrictions Weight Bearing Restrictions: No    Mobility  Bed Mobility Overal bed mobility: Needs Assistance Bed Mobility: Sit to Sidelying     Supine to sit: Min assist   Sit to sidelying: Min guard General bed mobility comments: pt up in chair upon PT arrival, then returned to bed. pt able to transfer with increased time and use of bed rail  Transfers Overall transfer level: Needs assistance Equipment used: Rolling walker (2 wheeled) Transfers: Sit to/from Stand Sit to Stand: Min assist         General transfer comment: increased time, verbal cues to minimize arching of the back  Ambulation/Gait Ambulation/Gait assistance: Min guard Gait Distance (Feet): 35 Feet Assistive device: Rolling walker (2 wheeled) Gait Pattern/deviations: Step-to pattern;Decreased stance time - left Gait velocity: slow   General Gait Details: pt with significant L LE burning and pain limiting WBing tolerance. Pt only able to  amb 35' today due to pain   Stairs             Wheelchair Mobility    Modified Rankin (Stroke Patients Only)       Balance Overall balance assessment: Needs assistance Sitting-balance support: No upper extremity supported Sitting balance-Leahy Scale: Fair Sitting balance - Comments: pt bracing with bil UE due to pain   Standing balance support: Bilateral upper extremity supported Standing balance-Leahy Scale: Fair Standing balance comment: heavy reliance on RLE> unable to sustain L LE foot flat on the floor                            Cognition Arousal/Alertness: Awake/alert Behavior During Therapy: WFL for tasks assessed/performed Overall Cognitive Status: Within Functional Limits for tasks assessed                                        Exercises      General Comments General comments (skin integrity, edema, etc.): VSS      Pertinent Vitals/Pain Pain Assessment: 0-10 Pain Score: 10-Worst pain ever Pain Location: Left side ( buttock and then quad when standing) Pain Descriptors / Indicators: Burning;Numbness Pain Intervention(s): Monitored during session    Home Living                      Prior Function            PT Goals (current goals can now be found in the care plan section)  Acute Rehab PT Goals Patient Stated Goal: stop the burning Progress towards PT goals: Not progressing toward goals - comment(limited by pain)    Frequency    Min 5X/week      PT Plan Current plan remains appropriate    Co-evaluation              AM-PAC PT "6 Clicks" Mobility   Outcome Measure  Help needed turning from your back to your side while in a flat bed without using bedrails?: A Little Help needed moving from lying on your back to sitting on the side of a flat bed without using bedrails?: A Little Help needed moving to and from a bed to a chair (including a wheelchair)?: A Little Help needed standing up from a  chair using your arms (e.g., wheelchair or bedside chair)?: A Little Help needed to walk in hospital room?: A Little Help needed climbing 3-5 steps with a railing? : A Little 6 Click Score: 18    End of Session Equipment Utilized During Treatment: Back brace Activity Tolerance: Patient tolerated treatment well Patient left: in bed;with call bell/phone within reach;with nursing/sitter in room Nurse Communication: Mobility status PT Visit Diagnosis: Unsteadiness on feet (R26.81);Pain Pain - part of body: (back)     Time: 8811-0315 PT Time Calculation (min) (ACUTE ONLY): 25 min  Charges:  $Gait Training: 8-22 mins $Therapeutic Activity: 8-22 mins                     Kittie Plater, PT, DPT Acute Rehabilitation Services Pager #: [phone] Office #: [phone]    Berline Lopes 01/23/2019, 9:04 AM

## 2019-01-23 NOTE — Progress Notes (Signed)
Occupational Therapy Treatment Patient Details Name: [name] Sherman MRN: [id number] DOB: [birth year] Today's Date: 01/23/2019    History of present illness 58 yo female s/p BIL decompression L5-S1 with posterior lumbar interbody arthrodesis using local autograft and allograft placement cages . Pedicle screw fixation L5-s1 posterior lateral arthrodesis with local autograft and allograft PMH: morbid obesity    OT comments  Pt demonstrates 9 out 10 pain in L side that is limiting progress toward goals. Pt willing to participate and asking "what do you want me to do next?" between grimaces. Pt noted to have L LE twitch (bouncing) in static standing with oral care. Pt states "see that is new". Pt reports numbness at bil hip area and numbness L buttock with shooting pain buttock down thigh. Pt requires min (A) with heavy use of RW for basic transfer. Pt sitting with heavy R lean due to L side pain in chair.    Follow Up Recommendations  No OT follow up    Equipment Recommendations  Other (comment)(RW)    Recommendations for Other Services      Precautions / Restrictions Precautions Precautions: Back Precaution Comments: reviewing back precautions during adls Required Braces or Orthoses: Spinal Brace       Mobility Bed Mobility Overal bed mobility: Needs Assistance Bed Mobility: Supine to Sit     Supine to sit: Min assist     General bed mobility comments: pt with hOb >45 degrees heavy dependence on bed rail. pt unable to tolerate HOb repositioning . pt able to come to sitting with min (A) for trunk elevation.   Transfers Overall transfer level: Needs assistance Equipment used: Rolling walker (2 wheeled) Transfers: Sit to/from Stand Sit to Stand: Min assist         General transfer comment: pt requires widen base of support to complete sit<>STand from bed. pt attempting to pull up on RW and cued to push from bed    Balance Overall balance assessment: Needs  assistance Sitting-balance support: Bilateral upper extremity supported;Feet supported Sitting balance-Leahy Scale: Fair Sitting balance - Comments: pt bracing with bil UE due to pain   Standing balance support: Bilateral upper extremity supported;During functional activity Standing balance-Leahy Scale: Fair Standing balance comment: heavy reliance on RLE> unable to sustain L LE foot flat on the floor                           ADL either performed or assessed with clinical judgement   ADL Overall ADL's : Needs assistance/impaired Eating/Feeding: Independent   Grooming: Wash/dry hands;Oral care;Minimal assistance;Standing Grooming Details (indicate cue type and reason): Min (A) for balance due to with L LE pain         Upper Body Dressing : Moderate assistance Upper Body Dressing Details (indicate cue type and reason): requires Mod (A) to apply brace sitting due to shooting pain on L side. Pt requires (A) to place brace and then in standing able to secure brace herself       Toilet Transfer Details (indicate cue type and reason): pt reports getting up with staff this morning and inability to complete peri care. Deferring this information to next session due to patient currently uncomfortable and decreased sustain attention to education due to pain           General ADL Comments: pt agreeable to participate despite pain and reports feeling nauseate from the pain     Vision  Perception     Praxis      Cognition Arousal/Alertness: Awake/alert Behavior During Therapy: WFL for tasks assessed/performed Overall Cognitive Status: Within Functional Limits for tasks assessed                                          Exercises     Shoulder Instructions       General Comments pain increased from elevation and pt less talkive / joking than previously. pt now very stoic in appearance.     Pertinent Vitals/ Pain       Pain Assessment:  0-10 Pain Score: 9  Pain Location: Left side ( buttock and then quad when standing) Pain Descriptors / Indicators: Operative site guarding Pain Intervention(s): Monitored during session;Repositioned  Home Living                                          Prior Functioning/Environment              Frequency  Min 3X/week        Progress Toward Goals  OT Goals(current goals can now be found in the care plan section)  Progress towards OT goals: Not progressing toward goals - comment(due to pain increased compared to 01/22/19)  Acute Rehab OT Goals Patient Stated Goal: to get this pain under control OT Goal Formulation: With patient Time For Goal Achievement: 02/05/19 Potential to Achieve Goals: Good ADL Goals Pt Will Perform Lower Body Dressing: with modified independence;with adaptive equipment Pt Will Transfer to Toilet: with modified independence  Plan Discharge plan remains appropriate    Co-evaluation                 AM-PAC OT "6 Clicks" Daily Activity     Outcome Measure   Help from another person eating meals?: None Help from another person taking care of personal grooming?: None Help from another person toileting, which includes using toliet, bedpan, or urinal?: A Lot Help from another person bathing (including washing, rinsing, drying)?: A Lot Help from another person to put on and taking off regular upper body clothing?: A Little Help from another person to put on and taking off regular lower body clothing?: A Lot 6 Click Score: 17    End of Session Equipment Utilized During Treatment: Rolling walker;Back brace  OT Visit Diagnosis: Unsteadiness on feet (R26.81)   Activity Tolerance Patient tolerated treatment well   Patient Left in chair;with call bell/phone within reach   Nurse Communication Mobility status;Precautions        Time: 7902-4097 OT Time Calculation (min): 16 min  Charges: OT General Charges $OT Visit: 1  Visit OT Treatments $Self Care/Home Management : 8-22 mins   Jeri Modena, OTR/L  Acute Rehabilitation Services Pager: [phone] Office: [phone] .    Jeri Modena 01/23/2019, 8:40 AM

## 2019-01-23 NOTE — Progress Notes (Signed)
Last pm pt had pain focused on left lower back, left buttock.   This morning pt's pain in left lower back, buttock and some burning pain around to lateral and front of upper thigh. Still has numbness in upper thigh/groin area, finds it difficult to put left foot down flat on floor.  Has worked some with PT and OT, and has worked thru the pain as well as she can, with medications on board.   See PT/OT notes as well.

## 2019-01-24 MED ORDER — DEXAMETHASONE 1 MG PO TABS: ORAL_TABLET | ORAL | 0 refills | Status: DC

## 2019-01-24 MED ORDER — OXYCODONE-ACETAMINOPHEN 10-325 MG PO TABS: 1.0000 | ORAL_TABLET | ORAL | 0 refills | Status: DC | PRN

## 2019-01-24 MED ORDER — METHOCARBAMOL 500 MG PO TABS: 500.0000 mg | ORAL_TABLET | Freq: Four times a day (QID) | ORAL | 3 refills | Status: DC | PRN

## 2019-01-24 MED ORDER — GABAPENTIN 300 MG PO CAPS: 300.0000 mg | ORAL_CAPSULE | Freq: Three times a day (TID) | ORAL | 3 refills | Status: DC

## 2019-01-24 NOTE — Progress Notes (Signed)
Physical Therapy Treatment Patient Details Name: [name] Sherman MRN: [id number] DOB: 1961-02-11 Today's Date: 01/24/2019    History of Present Illness 58 yo female s/p BIL decompression L5-S1 with posterior lumbar interbody arthrodesis using local autograft and allograft placement cages . Pedicle screw fixation L5-s1 posterior lateral arthrodesis with local autograft and allograft PMH: morbid obesity     PT Comments    Pt met her physical therapy goals during her inpatient stay. Pt with improved pain control today, still noting some glute "burning," and "numbness," with progressive mobility. Pt ambulating 200 feet with walker without physical assist. Education and demonstration provided re: car transfer technique, maintaining spinal precautions with ADL's including toileting, generalized walking program and activity progression. Pt with no further questions/concerns. PT signing off.     Follow Up Recommendations  No PT follow up;Supervision - Intermittent     Equipment Recommendations  Rolling walker with 5" wheels    Recommendations for Other Services       Precautions / Restrictions Precautions Precautions: Back Precaution Booklet Issued: Yes (comment) Precaution Comments: pt recalling 3/3 precautions Required Braces or Orthoses: Spinal Brace Spinal Brace: Thoracolumbosacral orthotic Restrictions Weight Bearing Restrictions: No    Mobility  Bed Mobility Overal bed mobility: Modified Independent                Transfers Overall transfer level: Modified independent Equipment used: Rolling walker (2 wheeled)                Ambulation/Gait Ambulation/Gait assistance: Modified independent (Device/Increase time) Gait Distance (Feet): 200 Feet Assistive device: Rolling walker (2 wheeled) Gait Pattern/deviations: Decreased stance time - left;Step-through pattern;Decreased stride length Gait velocity: slow   General Gait Details: Slow speed, decreased bilateral  foot clearance but overall steady. Lightly utilizing walker, increased weightbearing through arms towards end of walk with pain   Stairs             Wheelchair Mobility    Modified Rankin (Stroke Patients Only)       Balance Overall balance assessment: Needs assistance   Sitting balance-Leahy Scale: Good     Standing balance support: No upper extremity supported;During functional activity Standing balance-Leahy Scale: Fair                              Cognition Arousal/Alertness: Awake/alert Behavior During Therapy: WFL for tasks assessed/performed Overall Cognitive Status: Within Functional Limits for tasks assessed                                        Exercises      General Comments        Pertinent Vitals/Pain Pain Assessment: Faces Faces Pain Scale: Hurts little more Pain Location: Bilateral glutes, radiating into proximal hamstrings Pain Descriptors / Indicators: Burning;Numbness Pain Intervention(s): Monitored during session;Limited activity within patient's tolerance    Home Living                      Prior Function            PT Goals (current goals can now be found in the care plan section) Acute Rehab PT Goals Patient Stated Goal: "go for long walks." Potential to Achieve Goals: Good Progress towards PT goals: Progressing toward goals    Frequency    Min 5X/week      PT  Plan Current plan remains appropriate    Co-evaluation              AM-PAC PT "6 Clicks" Mobility   Outcome Measure  Help needed turning from your back to your side while in a flat bed without using bedrails?: None Help needed moving from lying on your back to sitting on the side of a flat bed without using bedrails?: None Help needed moving to and from a bed to a chair (including a wheelchair)?: None Help needed standing up from a chair using your arms (e.g., wheelchair or bedside chair)?: None Help needed to walk  in hospital room?: None Help needed climbing 3-5 steps with a railing? : A Little 6 Click Score: 23    End of Session Equipment Utilized During Treatment: Back brace Activity Tolerance: Patient tolerated treatment well Patient left: with call bell/phone within reach;in bed Nurse Communication: Mobility status PT Visit Diagnosis: Unsteadiness on feet (R26.81);Pain Pain - part of body: (back)     Time: 0964-3838 PT Time Calculation (min) (ACUTE ONLY): 18 min  Charges:  $Therapeutic Activity: 8-22 mins                     Ellamae Sia, PT, DPT Acute Rehabilitation Services Pager [phone] Office [phone]    Willy Eddy 01/24/2019, 12:00 PM

## 2019-01-24 NOTE — Progress Notes (Signed)
Occupational Therapy Treatment Patient Details Name: [name] Sherman MRN: [id number] DOB: Nov 02, 1960 Today's Date: 01/24/2019    History of present illness 58 yo female s/p BIL decompression L5-S1 with posterior lumbar interbody arthrodesis using local autograft and allograft placement cages . Pedicle screw fixation L5-s1 posterior lateral arthrodesis with local autograft and allograft PMH: morbid obesity    OT comments  Pt progressing towards OT goals, reports pain much improved from yesterday. Focus of session on education re: AE for LB and toileting ADL. Educated and demonstrated use of AE including reacher and toilet aide with pt verbalizing understanding throughout. Pt very appreciative of education as pericare for toileting has been challenging and she reports wishes to perform without assist from spouse. Reports plans to purchase at time of discharge. Pt demonstrating functional transfers at mod independent - min guard assist level today. Pt anticipating d/c home later today.    Follow Up Recommendations  No OT follow up    Equipment Recommendations  None recommended by OT          Precautions / Restrictions Precautions Precautions: Back Precaution Booklet Issued: Yes (comment) Precaution Comments: pt recalling 3/3 precautions Required Braces or Orthoses: Spinal Brace Spinal Brace: Thoracolumbosacral orthotic Restrictions Weight Bearing Restrictions: No       Mobility Bed Mobility Overal bed mobility: Modified Independent             General bed mobility comments: received OOB in recliner  Transfers Overall transfer level: Modified independent Equipment used: Rolling walker (2 wheeled)             General transfer comment: sit<>Stand without difficulty    Balance Overall balance assessment: Needs assistance   Sitting balance-Leahy Scale: Good     Standing balance support: No upper extremity supported;During functional activity Standing balance-Leahy  Scale: Fair                             ADL either performed or assessed with clinical judgement   ADL Overall ADL's : Needs assistance/impaired Eating/Feeding: Independent Eating/Feeding Details (indicate cue type and reason): setup for lunch end of session                 Lower Body Dressing: Min guard Lower Body Dressing Details (indicate cue type and reason): reviewed use of AE for increased ease of completing LB ADL including use of reacher; pt verbalizing understanding of demosntration        Toileting - Clothing Manipulation Details (indicate cue type and reason): educated in use of AE (toilet aide) for increasing adherence to back precautions during pericare; pt very appreciative of education and planning to obtain; demonstration of AE completed this session     Functional mobility during ADLs: Min guard;Rolling walker       Vision       Perception     Praxis      Cognition Arousal/Alertness: Awake/alert Behavior During Therapy: WFL for tasks assessed/performed Overall Cognitive Status: Within Functional Limits for tasks assessed                                          Exercises     Shoulder Instructions       General Comments      Pertinent Vitals/ Pain       Pain Assessment: Faces Faces Pain Scale: Hurts little  more Pain Location: Bilateral glutes, radiating into proximal hamstrings Pain Descriptors / Indicators: Burning;Numbness Pain Intervention(s): Monitored during session;Repositioned;Limited activity within patient's tolerance  Home Living                                          Prior Functioning/Environment              Frequency  Min 3X/week        Progress Toward Goals  OT Goals(current goals can now be found in the care plan section)  Progress towards OT goals: Progressing toward goals  Acute Rehab OT Goals Patient Stated Goal: home today OT Goal Formulation: With  patient Time For Goal Achievement: 02/05/19 Potential to Achieve Goals: Good  Plan Discharge plan remains appropriate    Co-evaluation                 AM-PAC OT "6 Clicks" Daily Activity     Outcome Measure   Help from another person eating meals?: None Help from another person taking care of personal grooming?: None Help from another person toileting, which includes using toliet, bedpan, or urinal?: A Lot Help from another person bathing (including washing, rinsing, drying)?: A Lot Help from another person to put on and taking off regular upper body clothing?: A Little Help from another person to put on and taking off regular lower body clothing?: A Lot 6 Click Score: 17    End of Session Equipment Utilized During Treatment: Rolling walker(AE)  OT Visit Diagnosis: Unsteadiness on feet (R26.81)   Activity Tolerance Patient tolerated treatment well   Patient Left in chair;with call bell/phone within reach   Nurse Communication Mobility status        Time: 4503-8882 OT Time Calculation (min): 16 min  Charges: OT General Charges $OT Visit: 1 Visit OT Treatments $Self Care/Home Management : 8-22 mins  Lou Cal, OT Supplemental Rehabilitation Services Pager [phone] Office [phone]    Raymondo Band 01/24/2019, 3:20 PM

## 2019-01-24 NOTE — Discharge Summary (Addendum)
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 Discharge and final diagnoses: Recurrent herniated nucleus pulposus L5-S1.  Lumbar radiculopathy.  Advanced degenerative disease with degenerative scoliosis L5-S1. Hospital course: Patient was admitted to undergo surgical decompression and stabilization at L5-S1 having had a herniated nucleus pulposus last year that was resected using a microdiscectomy.  She developed a substantial recurrence of the disc in addition had advanced degenerative changes with a degenerative scoliosis that was forming and it was felt that she would require surgical stabilization at L5-S1 in order to overcome the chronic radicular pain in addition to back pain that she was experiencing.  She tolerated her surgery well and the following day was able to ambulate however soon she had an exquisite amount of pain in the left buttock and left lower extremity and it was felt that this was secondary due to inflammation a CT scan of the lumbar spine demonstrated good positioning of the hardware and good decompression of the L5 nerve roots.  She was started on Decadron and gabapentin 300 mg 3 times a day.  This seemed to help ameliorate the pain considerably.  Her incision has remained clean and dry.  Discharge medications include Percocet 10 mg #61 every 4 hours as needed pain, Robaxin 500 mg #40 with 3 refills 1 every 6 hours as needed muscle spasm, Decadron 1 mg tablets taper over the next 5 days total of 15 given, and gabapentin 300 mg 1 3 times daily #100 with 3 refills.

## 2019-01-24 NOTE — Progress Notes (Signed)
Pt given D/C education and all questions answered. No printed prescriptions to give and equipment delivered to room. IV removed. Pt taken to car with all belongings.

## 2019-01-26 LAB — AEROBIC/ANAEROBIC CULTURE W GRAM STAIN (SURGICAL/DEEP WOUND)
Culture: NO GROWTH
Gram Stain: NONE SEEN

## 2019-01-28 ENCOUNTER — Other Ambulatory Visit: Payer: Self-pay | Admitting: Family Medicine

## 2019-01-29 ENCOUNTER — Other Ambulatory Visit: Payer: Self-pay | Admitting: Family Medicine

## 2019-01-29 MED ORDER — AMPHETAMINE-DEXTROAMPHETAMINE 30 MG PO TABS: ORAL_TABLET | ORAL | 0 refills | Status: DC

## 2019-01-29 NOTE — Telephone Encounter (Signed)
Please see message. °

## 2019-01-29 NOTE — Telephone Encounter (Signed)
Last OV 12/21/18, No future OV  Last filled 12/26/18, # 45 with 0 refills

## 2019-01-29 NOTE — Telephone Encounter (Signed)
Pt called to check status of a refill on amphetamine-dextroamphetamine (ADDERALL) 30 MG tablet  / please advise

## 2019-01-30 ENCOUNTER — Encounter: Payer: Self-pay | Admitting: Family Medicine

## 2019-01-30 ENCOUNTER — Other Ambulatory Visit: Payer: Self-pay

## 2019-01-30 ENCOUNTER — Ambulatory Visit (INDEPENDENT_AMBULATORY_CARE_PROVIDER_SITE_OTHER): Payer: 59 | Admitting: Family Medicine

## 2019-01-30 DIAGNOSIS — E039 Hypothyroidism, unspecified: Secondary | ICD-10-CM | POA: Diagnosis not present

## 2019-01-30 DIAGNOSIS — M5416 Radiculopathy, lumbar region: Secondary | ICD-10-CM | POA: Diagnosis not present

## 2019-01-30 DIAGNOSIS — R42 Dizziness and giddiness: Secondary | ICD-10-CM

## 2019-01-30 DIAGNOSIS — H8190 Unspecified disorder of vestibular function, unspecified ear: Secondary | ICD-10-CM | POA: Insufficient documentation

## 2019-01-30 DIAGNOSIS — E785 Hyperlipidemia, unspecified: Secondary | ICD-10-CM | POA: Diagnosis not present

## 2019-01-30 MED ORDER — ONDANSETRON 4 MG PO TBDP: 4.0000 mg | ORAL_TABLET | Freq: Three times a day (TID) | ORAL | 0 refills | Status: DC | PRN

## 2019-01-30 NOTE — Progress Notes (Signed)
This visit type was conducted due to national recommendations for restrictions regarding the COVID-19 pandemic in an effort to limit this patient's exposure and mitigate transmission in our community.   Virtual Visit via Video Note  I connected with [name] Sherman on 01/30/19 at  8:45 AM EDT by a video enabled telemedicine application and verified that I am speaking with the correct person using two identifiers.  Location patient: home Location provider:work or home office Persons participating in the virtual visit: patient, provider  I discussed the limitations of evaluation and management by telemedicine and the availability of in person appointments. The patient expressed understanding and agreed to proceed.   HPI: Hospital follow-up from recent back surgery and patient also having increased vertigo symptoms.  She was admitted on the 27th and had bilateral decompression L5-S1 with posterior lumbar interbody arthrodesis.  She did relatively well after surgery but did develop some acute left lower extremity pain and was placed on Decadron and gabapentin per neurosurgeon.  She was also discharged on Percocet and Robaxin.  She is trying to scale back Percocet.  Tapering off Decadron.  On gabapentin 300 mg 3 times daily.  Back pain is relatively stable.  Postoperative hemoglobin 9.7.  Patient is having some low-grade nausea but no vomiting.  She is not sure if this is medication related.  She is also having though some recurrent vertigo.  This has been somewhat chronic and intermittent.  Symptoms are worse to the left.  Denies any associated speech changes, swallowing difficulties, ataxia, or any focal weakness.  She has done Epley maneuvers in the past with some success but obviously cannot do those maneuvers now.  She has hypothyroidism and is overdue for labs with TSH and also lipids.  She takes Lipitor 10 mg daily for hyperlipidemia.   ROS: See pertinent positives and negatives per  HPI.  Past Medical History:  Diagnosis Date  . ADD (attention deficit disorder)   . Anemia   . Anxiety   . Arthritis    shoulder, right (arthritis, tendonitis, bursitis)  . Cancer ([place])    cervical cancer (conization)   . Depression   . History of cervical cancer    s/p  laser ablation of cervix 1980's  . History of kidney stones   . Hyperlipidemia   . Hypertension   . Hypothyroidism   . IBS (irritable bowel syndrome)   . Irregular heart rate    as a child, has never had any problems   . Left ureteral stone   . Migraine   . OSA (obstructive sleep apnea)    pt used cpap up until 2013 states lost wt and did not need anymore  . Restless legs   . Umbilical hernia without obstruction or gangrene   . Urgency of urination   . Vertigo   . Vitamin D deficiency     Past Surgical History:  Procedure Laterality Date  . ABDOMINAL HYSTERECTOMY    . BACK SURGERY  04/27/2018  . BUNIONECTOMY Right 2004  . COLONOSCOPY    . CYSTOSCOPY W/ RETROGRADES Bilateral 05/26/2015   Procedure: CYSTOSCOPY WITH RETROGRADE PYELOGRAM;  Surgeon: Festus Aloe, MD;  Location[facility];  Service: Urology;  Laterality: Bilateral;  . CYSTOSCOPY W/ URETERAL STENT REMOVAL Left 05/26/2015   Procedure: CYSTOSCOPY WITH STENT REMOVAL;  Surgeon: Festus Aloe, MD;  Location: [facility];  Service: Urology;  Laterality: Left;  . CYSTOSCOPY WITH RETROGRADE PYELOGRAM, URETEROSCOPY AND STENT PLACEMENT Left 05/01/2015   Procedure: CYSTOSCOPY  WITH RETROGRADE PYELOGRAM AND STENT PLACEMENT;  Surgeon: Festus Aloe, MD;  Location: [facility];  Service: Urology;  Laterality: Left;  . CYSTOSCOPY WITH STENT PLACEMENT Bilateral 05/26/2015   Procedure: CYSTOSCOPY WITH STENT PLACEMENT;  Surgeon: Festus Aloe, MD;  Location[facility];  Service: Urology;  Laterality: Bilateral;  . CYSTOSCOPY WITH URETEROSCOPY Bilateral 05/26/2015   Procedure: CYSTOSCOPY WITH URETEROSCOPY;   Surgeon: Festus Aloe, MD;  Location: [facility];  Service: Urology;  Laterality: Bilateral;  . CYSTOSCOPY/URETEROSCOPY/HOLMIUM LASER/STENT PLACEMENT Right 06/09/2015   Procedure: CYSTOSCOPY/URETEROSCOPY/STENT PLACEMENT REMOVAL LEFT URETERAL STENT;  Surgeon: Festus Aloe, MD;  Location: [facility];  Service: Urology;  Laterality: Right;  . HOLMIUM LASER APPLICATION Left 71/24/5809   Procedure: HOLMIUM LASER APPLICATION;  Surgeon: Festus Aloe, MD;  Location: [facility];  Service: Urology;  Laterality: Left;  . HOLMIUM LASER APPLICATION Right 98/33/8250   Procedure: HOLMIUM LASER APPLICATION;  Surgeon: Festus Aloe, MD;  Location: [facility];  Service: Urology;  Laterality: Right;  . INSERTION OF MESH N/A 09/05/2016   Procedure: INSERTION OF MESH;  Surgeon: [name], MD;  Location: [place];  Service: General;  Laterality: N/A;  . LAPAROSCOPIC ASSISTED VAGINAL HYSTERECTOMY  04-23-2002   and Left Salpingoophorectomy/  Anterior Repair/  Tension Free Tape Sling placement  . LASER ABLATION OF THE CERVIX  x2  1980's  . TUBAL LIGATION  1980's  . VENTRAL HERNIA REPAIR N/A 09/05/2016   Procedure: LAPAROSCOPIC VENTRAL HERNIA;  Surgeon: [name], MD;  Location: [place];  Service: General;  Laterality: N/A;    Family History  Problem Relation Age of Onset  . Alcohol abuse Mother   . Lung cancer Mother   . Arthritis Father   . Hyperlipidemia Father   . Heart disease Father   . Hypertension Father   . Kidney disease Paternal Uncle   . Cancer Maternal Grandmother        colon  . Cancer Paternal Grandmother        breast    SOCIAL HX: Non-smoker   Current Outpatient Medications:  .  amphetamine-dextroamphetamine (ADDERALL) 30 MG tablet, Take one tablet twice daily, Disp: 60 tablet, Rfl: 0 .  amphetamine-dextroamphetamine (ADDERALL) 30 MG tablet, Take one tablet by mouth twice daily.  May refill in one month, Disp: 60 tablet, Rfl: 0 .   amphetamine-dextroamphetamine (ADDERALL) 30 MG tablet, Take one tablet by mouth twice daily.   May refill in two months., Disp: 60 tablet, Rfl: 0 .  atorvastatin (LIPITOR) 10 MG tablet, TAKE 1 TABLET BY MOUTH DAILY (Patient taking differently: Take 10 mg by mouth at bedtime. ), Disp: 90 tablet, Rfl: 1 .  citalopram (CELEXA) 20 MG tablet, TAKE 3 TABLETS BY MOUTH EVERY NIGHT AT BEDTIME OR AS DIRECTED, Disp: 90 tablet, Rfl: 3 .  dexamethasone (DECADRON) 1 MG tablet, 2 tablets twice daily for 2 days, one tablet twice daily for 2 days, one tablet daily for 2 days., Disp: 15 tablet, Rfl: 0 .  furosemide (LASIX) 20 MG tablet, TAKE 1 TABLET BY MOUTH EVERY DAY (Patient taking differently: Take 20 mg by mouth daily as needed for edema. ), Disp: 30 tablet, Rfl: 1 .  gabapentin (NEURONTIN) 300 MG capsule, Take 1 capsule (300 mg total) by mouth 3 (three) times daily., Disp: 100 capsule, Rfl: 3 .  levothyroxine (SYNTHROID) 50 MCG tablet, TAKE 1 TABLET BY MOUTH DAILY (Patient taking differently: Take 50 mcg by mouth at bedtime. ), Disp: 90 tablet, Rfl: 0 .  losartan-hydrochlorothiazide (HYZAAR) 50-12.5 MG tablet, TAKE 1 TABLET BY MOUTH DAILY (Patient taking differently: Take 1 tablet by mouth at bedtime. ), Disp: 90 tablet, Rfl: 1 .  methocarbamol (ROBAXIN) 500 MG tablet, Take 1 tablet (500 mg total) by mouth every 6 (six) hours as needed for muscle spasms., Disp: 40 tablet, Rfl: 3 .  Multiple Vitamins-Minerals (HAIR SKIN AND NAILS FORMULA) TABS, Take 2 tablets by mouth at bedtime., Disp: , Rfl:  .  ondansetron (ZOFRAN ODT) 4 MG disintegrating tablet, Take 1 tablet (4 mg total) by mouth every 8 (eight) hours as needed for nausea or vomiting., Disp: 20 tablet, Rfl: 0 .  oxyCODONE-acetaminophen (PERCOCET) 10-325 MG tablet, Take 1 tablet by mouth every 4 (four) hours as needed for pain., Disp: 60 tablet, Rfl: 0 .  pantoprazole (PROTONIX) 40 MG tablet, TAKE 1 TABLET(40 MG) BY MOUTH DAILY, Disp: 90 tablet, Rfl: 1 .   SUMAtriptan (IMITREX) 100 MG tablet, Take 1 tablet (100 mg total) by mouth every 2 (two) hours as needed for migraine. May repeat in 2 hours if headache persists or recurs., Disp: 10 tablet, Rfl: 11 .  Vitamin D, Ergocalciferol, (DRISDOL) 1.25 MG (50000 UT) CAPS capsule, TAKE 1 CAPSULE BY MOUTH 1 TIME A WEEK (Patient taking differently: Take 50,000 Units by mouth every Monday. ), Disp: 12 capsule, Rfl: 3  EXAM:  VITALS per patient if applicable:  GENERAL: alert, oriented, appears well and in no acute distress  HEENT: atraumatic, conjunttiva clear, no obvious abnormalities on inspection of external nose and ears  NECK: normal movements of the head and neck  LUNGS: on inspection no signs of respiratory distress, breathing rate appears normal, no obvious gross SOB, gasping or wheezing  CV: no obvious cyanosis  MS: moves all visible extremities without noticeable abnormality  PSYCH/NEURO: pleasant and cooperative, no obvious depression or anxiety, speech and thought processing grossly intact  ASSESSMENT AND PLAN:  Discussed the following assessment and plan:  #1 recent back surgery for L5-S1 bilateral decompression and posterior lumbar interbody arthrodesis-overall doing fairly well  -She will continue gabapentin at discretion of neurosurgeon  #2 intermittent vertigo.  History of benign peripheral positional vertigo.  -Cannot do Epley maneuvers currently because of recent surgery. -We sent in some Zofran 4 mg ODT 1 every 8 hours as needed for nausea.  We will try to avoid meclizine with other current sedative medications including muscle relaxer, opioid, and gabapentin -Consider vestibular rehab if symptoms persisting after recovery from surgery  #3 hypothyroidism-in need of follow-up labs  -Patient will set up follow-up this fall after she is recovered from surgery for TSH  #4 hyperlipidemia  -Needs follow-up lipids and will schedule after recovers from surgery  #5  postoperative anemia  -Suggest that she try over-the-counter iron as tolerated and also plenty of iron rich foods   I discussed the assessment and treatment plan with the patient. The patient was provided an opportunity to ask questions and all were answered. The patient agreed with the plan and demonstrated an understanding of the instructions.   The patient was advised to call back or seek an in-person evaluation if the symptoms worsen or if the condition fails to improve as anticipated.    Carolann Littler, MD

## 2019-02-17 ENCOUNTER — Other Ambulatory Visit: Payer: Self-pay | Admitting: Family Medicine

## 2019-02-24 ENCOUNTER — Other Ambulatory Visit: Payer: Self-pay | Admitting: Family Medicine

## 2019-02-26 ENCOUNTER — Other Ambulatory Visit: Payer: Self-pay | Admitting: Family Medicine

## 2019-02-26 ENCOUNTER — Telehealth: Payer: Self-pay | Admitting: Family Medicine

## 2019-02-26 NOTE — Telephone Encounter (Signed)
Please see below request  

## 2019-02-26 NOTE — Telephone Encounter (Signed)
RX REFILL amphetamine-dextroamphetamine (ADDERALL) 30 MG tablet  PHARMACY [facility] [id number], [address] AT [phone] (Phone) [phone] (Fax

## 2019-02-26 NOTE — Telephone Encounter (Signed)
This was refilled for 3 months on 01-29-19, so she should have refills.  Check with pharmacy.

## 2019-03-12 ENCOUNTER — Encounter: Payer: Managed Care, Other (non HMO) | Admitting: Family Medicine

## 2019-03-19 ENCOUNTER — Other Ambulatory Visit: Payer: Self-pay | Admitting: Family Medicine

## 2019-03-22 ENCOUNTER — Other Ambulatory Visit: Payer: Self-pay

## 2019-03-22 ENCOUNTER — Ambulatory Visit (INDEPENDENT_AMBULATORY_CARE_PROVIDER_SITE_OTHER): Payer: Managed Care, Other (non HMO) | Admitting: Family Medicine

## 2019-03-22 VITALS — BP 128/68 | HR 94 | Temp 98.1°F | Ht 64.0 in | Wt 266.5 lb

## 2019-03-22 DIAGNOSIS — Z23 Encounter for immunization: Secondary | ICD-10-CM

## 2019-03-22 DIAGNOSIS — Z0000 Encounter for general adult medical examination without abnormal findings: Secondary | ICD-10-CM

## 2019-03-22 LAB — CBC WITH DIFFERENTIAL/PLATELET
Basophils Absolute: 0.1 10*3/uL (ref 0.0–0.1)
Basophils Relative: 0.7 % (ref 0.0–3.0)
Eosinophils Absolute: 0.2 10*3/uL (ref 0.0–0.7)
Eosinophils Relative: 1.9 % (ref 0.0–5.0)
HCT: 37.1 % (ref 36.0–46.0)
Hemoglobin: 12 g/dL (ref 12.0–15.0)
Lymphocytes Relative: 24.9 % (ref 12.0–46.0)
Lymphs Abs: 2.5 10*3/uL (ref 0.7–4.0)
MCHC: 32.5 g/dL (ref 30.0–36.0)
MCV: 79.6 fl (ref 78.0–100.0)
Monocytes Absolute: 0.5 10*3/uL (ref 0.1–1.0)
Monocytes Relative: 5.2 % (ref 3.0–12.0)
Neutro Abs: 6.6 10*3/uL (ref 1.4–7.7)
Neutrophils Relative %: 67.3 % (ref 43.0–77.0)
Platelets: 337 10*3/uL (ref 150.0–400.0)
RBC: 4.66 Mil/uL (ref 3.87–5.11)
RDW: 14.5 % (ref 11.5–15.5)
WBC: 9.9 10*3/uL (ref 4.0–10.5)

## 2019-03-22 LAB — LIPID PANEL
Cholesterol: 155 mg/dL (ref 0–200)
HDL: 58.9 mg/dL (ref >39.00)
LDL Cholesterol: 74 mg/dL (ref 0–99)
NonHDL: 96.18
Total CHOL/HDL Ratio: 3
Triglycerides: 112 mg/dL (ref 0.0–149.0)
VLDL: 22.4 mg/dL (ref 0.0–40.0)

## 2019-03-22 LAB — HEPATIC FUNCTION PANEL
ALT: 20 U/L (ref 0–35)
AST: 14 U/L (ref 0–37)
Albumin: 3.5 g/dL (ref 3.5–5.2)
Alkaline Phosphatase: 138 U/L — ABNORMAL HIGH (ref 39–117)
Bilirubin, Direct: 0.1 mg/dL (ref 0.0–0.3)
Total Bilirubin: 0.3 mg/dL (ref 0.2–1.2)
Total Protein: 6.5 g/dL (ref 6.0–8.3)

## 2019-03-22 LAB — BASIC METABOLIC PANEL
BUN: 14 mg/dL (ref 6–23)
CO2: 27 mEq/L (ref 19–32)
Calcium: 9.1 mg/dL (ref 8.4–10.5)
Chloride: 106 mEq/L (ref 96–112)
Creatinine, Ser: 0.86 mg/dL (ref 0.40–1.20)
GFR: 68.15 mL/min (ref >60.00)
Glucose, Bld: 113 mg/dL — ABNORMAL HIGH (ref 70–99)
Potassium: 4 mEq/L (ref 3.5–5.1)
Sodium: 141 mEq/L (ref 135–145)

## 2019-03-22 LAB — T3, FREE: T3, Free: 2.5 pg/mL (ref 2.3–4.2)

## 2019-03-22 LAB — TSH: TSH: 1.34 u[IU]/mL (ref 0.35–4.50)

## 2019-03-22 LAB — T4, FREE: Free T4: 1.01 ng/dL (ref 0.60–1.60)

## 2019-03-22 MED ORDER — LOSARTAN POTASSIUM-HCTZ 50-12.5 MG PO TABS: 1.0000 | ORAL_TABLET | Freq: Every day | ORAL | 3 refills | Status: DC

## 2019-03-22 MED ORDER — PRAMIPEXOLE DIHYDROCHLORIDE 0.125 MG PO TABS: ORAL_TABLET | ORAL | 3 refills | Status: DC

## 2019-03-22 NOTE — Patient Instructions (Signed)

## 2019-03-22 NOTE — Progress Notes (Signed)
Subjective:     Patient ID: [name] Sherman, female   DOB: March 02, 1961, 58 y.o.   MRN: [id number]  HPI   Patient is seen for complete physical.  Her chronic problems include history of hypothyroidism, hyperlipidemia, obesity.  She has concerns for possible restless leg syndrome.  She states that her legs tend to "move a lot" at night and her spouse has particularly noted this.  She frequently wakes up feeling exhausted in the morning.  She does have obstructive sleep apnea and is on CPAP.  She has had some recent increased fatigue issues.  She had recent back surgery and, as expected, postoperative anemia.  She is requesting free T4 and free T3.  She is compliant with taking her thyroid medication.  Health maintenance reviewed  -She has had previous hysterectomy for benign disease so no Pap smears indicated  -Needs repeat colonoscopy.  Her mom had colon cancer and also maternal grandmother had colon cancer.  Her last colonoscopy was 7 years ago -Needs flu vaccine and also tetanus -Due for repeat mammography  Past Medical History:  Diagnosis Date  . ADD (attention deficit disorder)   . Anemia   . Anxiety   . Arthritis    shoulder, right (arthritis, tendonitis, bursitis)  . Cancer ([place])    cervical cancer (conization)   . Depression   . History of cervical cancer    s/p  laser ablation of cervix 1980's  . History of kidney stones   . Hyperlipidemia   . Hypertension   . Hypothyroidism   . IBS (irritable bowel syndrome)   . Irregular heart rate    as a child, has never had any problems   . Left ureteral stone   . Migraine   . OSA (obstructive sleep apnea)    pt used cpap up until 2013 states lost wt and did not need anymore  . Restless legs   . Umbilical hernia without obstruction or gangrene   . Urgency of urination   . Vertigo   . Vitamin D deficiency    Past Surgical History:  Procedure Laterality Date  . ABDOMINAL HYSTERECTOMY    . BACK SURGERY  04/27/2018  .  BUNIONECTOMY Right 2004  . COLONOSCOPY    . CYSTOSCOPY W/ RETROGRADES Bilateral 05/26/2015   Procedure: CYSTOSCOPY WITH RETROGRADE PYELOGRAM;  Surgeon: Festus Aloe, MD;  Location: [facility];  Service: Urology;  Laterality: Bilateral;  . CYSTOSCOPY W/ URETERAL STENT REMOVAL Left 05/26/2015   Procedure: CYSTOSCOPY WITH STENT REMOVAL;  Surgeon: Festus Aloe, MD;  Location[facility];  Service: Urology;  Laterality: Left;  . CYSTOSCOPY WITH RETROGRADE PYELOGRAM, URETEROSCOPY AND STENT PLACEMENT Left 05/01/2015   Procedure: CYSTOSCOPY WITH RETROGRADE PYELOGRAM AND STENT PLACEMENT;  Surgeon: Festus Aloe, MD;  Location[facility];  Service: Urology;  Laterality: Left;  . CYSTOSCOPY WITH STENT PLACEMENT Bilateral 05/26/2015   Procedure: CYSTOSCOPY WITH STENT PLACEMENT;  Surgeon: Festus Aloe, MD;  Location: [facility];  Service: Urology;  Laterality: Bilateral;  . CYSTOSCOPY WITH URETEROSCOPY Bilateral 05/26/2015   Procedure: CYSTOSCOPY WITH URETEROSCOPY;  Surgeon: Festus Aloe, MD;  Location: [facility];  Service: Urology;  Laterality: Bilateral;  . CYSTOSCOPY/URETEROSCOPY/HOLMIUM LASER/STENT PLACEMENT Right 06/09/2015   Procedure: CYSTOSCOPY/URETEROSCOPY/STENT PLACEMENT REMOVAL LEFT URETERAL STENT;  Surgeon: Festus Aloe, MD;  Location[facility];  Service: Urology;  Laterality: Right;  . HOLMIUM LASER APPLICATION Left [id number]   Procedure: HOLMIUM LASER APPLICATION;  Surgeon: Festus Aloe, MD;  Location: [facility];  Service: Urology;  Laterality: Left;  . HOLMIUM LASER APPLICATION Right [id number]   Procedure: HOLMIUM LASER APPLICATION;  Surgeon: Festus Aloe, MD;  Location: [facility];  Service: Urology;  Laterality: Right;  . INSERTION OF MESH N/A 09/05/2016   Procedure: INSERTION OF MESH;  Surgeon: [name], MD;  Location: [place];  Service: General;  Laterality: N/A;  . LAPAROSCOPIC  ASSISTED VAGINAL HYSTERECTOMY  04-23-2002   and Left Salpingoophorectomy/  Anterior Repair/  Tension Free Tape Sling placement  . LASER ABLATION OF THE CERVIX  x2  1980's  . TUBAL LIGATION  1980's  . VENTRAL HERNIA REPAIR N/A 09/05/2016   Procedure: LAPAROSCOPIC VENTRAL HERNIA;  Surgeon: [name], MD;  Location: [place];  Service: General;  Laterality: N/A;    reports that she has never smoked. She has never used smokeless tobacco. She reports that she does not drink alcohol or use drugs. family history includes Alcohol abuse in her mother; Arthritis in her father; Cancer in her maternal grandmother and paternal grandmother; Heart disease in her father; Hyperlipidemia in her father; Hypertension in her father; Kidney disease in her paternal uncle; Lung cancer in her mother. Allergies  Allergen Reactions  . Keflex [Cephalexin] Swelling    Tongue swelling     Review of Systems  Constitutional: Positive for fatigue. Negative for activity change, appetite change, fever and unexpected weight change.  HENT: Negative for ear pain, hearing loss, sore throat and trouble swallowing.   Eyes: Negative for visual disturbance.  Respiratory: Negative for cough and shortness of breath.   Cardiovascular: Negative for chest pain and palpitations.  Gastrointestinal: Negative for abdominal pain, blood in stool, constipation and diarrhea.  Genitourinary: Negative for dysuria and hematuria.  Musculoskeletal: Positive for back pain. Negative for arthralgias and myalgias.  Skin: Negative for rash.  Neurological: Positive for dizziness. Negative for syncope and headaches.  Hematological: Negative for adenopathy.  Psychiatric/Behavioral: Negative for confusion and dysphoric mood.       Objective:   Physical Exam Constitutional:      Appearance: Normal appearance.  HENT:     Right Ear: Tympanic membrane and ear canal normal.     Left Ear: Tympanic membrane and ear canal normal.  Cardiovascular:      Rate and Rhythm: Normal rate and regular rhythm.  Pulmonary:     Effort: Pulmonary effort is normal.     Breath sounds: Normal breath sounds.  Musculoskeletal:     Right lower leg: No edema.     Left lower leg: No edema.  Neurological:     General: No focal deficit present.     Mental Status: She is alert and oriented to person, place, and time.     Cranial Nerves: No cranial nerve deficit.  Psychiatric:        Mood and Affect: Mood normal.        Thought Content: Thought content normal.        Assessment:     #1 physical exam.  We discussed health maintenance issues as below  #2 probable restless leg syndrome.  Symptoms becoming more bothersome especially at night    Plan:     -Flu vaccine and Tdap given -Set up repeat colonoscopy.  Positive family history of colon cancer in first-degree relative -Obtain follow-up lab work -Correct any anemia from surgery -Consider trial of Mirapex 0.125 mg 1 to 2 at night for restless leg symptoms -Avoid caffeine late in the day -Set up repeat mammogram  [name] MD [facility]

## 2019-03-25 ENCOUNTER — Encounter: Payer: 59 | Admitting: Family Medicine

## 2019-03-25 ENCOUNTER — Encounter: Payer: Self-pay | Admitting: Family Medicine

## 2019-03-25 LAB — VITAMIN D 25 HYDROXY (VIT D DEFICIENCY, FRACTURES): VITD: 37.95 ng/mL (ref 30.00–100.00)

## 2019-03-27 ENCOUNTER — Ambulatory Visit: Payer: Managed Care, Other (non HMO) | Admitting: Gastroenterology

## 2019-03-27 NOTE — Progress Notes (Deleted)
HPI :   RUQ [place] 02/2017 - no gallstones, hepatic steatosis   Past Medical History:  Diagnosis Date  . ADD (attention deficit disorder)   . Anemia   . Anxiety   . Arthritis    shoulder, right (arthritis, tendonitis, bursitis)  . Cancer ([place])    cervical cancer (conization)   . Depression   . History of cervical cancer    s/p  laser ablation of cervix 1980's  . History of kidney stones   . Hyperlipidemia   . Hypertension   . Hypothyroidism   . IBS (irritable bowel syndrome)   . Irregular heart rate    as a child, has never had any problems   . Left ureteral stone   . Migraine   . OSA (obstructive sleep apnea)    pt used cpap up until 2013 states lost wt and did not need anymore  . Restless legs   . Umbilical hernia without obstruction or gangrene   . Urgency of urination   . Vertigo   . Vitamin D deficiency      Past Surgical History:  Procedure Laterality Date  . ABDOMINAL HYSTERECTOMY    . BACK SURGERY  04/27/2018  . BUNIONECTOMY Right 2004  . COLONOSCOPY    . CYSTOSCOPY W/ RETROGRADES Bilateral 05/26/2015   Procedure: CYSTOSCOPY WITH RETROGRADE PYELOGRAM;  Surgeon: Festus Aloe, MD;  Location: [facility];  Service: Urology;  Laterality: Bilateral;  . CYSTOSCOPY W/ URETERAL STENT REMOVAL Left 05/26/2015   Procedure: CYSTOSCOPY WITH STENT REMOVAL;  Surgeon: Festus Aloe, MD;  Location: [facility];  Service: Urology;  Laterality: Left;  . CYSTOSCOPY WITH RETROGRADE PYELOGRAM, URETEROSCOPY AND STENT PLACEMENT Left 05/01/2015   Procedure: CYSTOSCOPY WITH RETROGRADE PYELOGRAM AND STENT PLACEMENT;  Surgeon: Festus Aloe, MD;  Location: [facility];  Service: Urology;  Laterality: Left;  . CYSTOSCOPY WITH STENT PLACEMENT Bilateral 05/26/2015   Procedure: CYSTOSCOPY WITH STENT PLACEMENT;  Surgeon: Festus Aloe, MD;  Location: [facility];  Service: Urology;  Laterality: Bilateral;  . CYSTOSCOPY WITH URETEROSCOPY  Bilateral 05/26/2015   Procedure: CYSTOSCOPY WITH URETEROSCOPY;  Surgeon: Festus Aloe, MD;  Location: [facility];  Service: Urology;  Laterality: Bilateral;  . CYSTOSCOPY/URETEROSCOPY/HOLMIUM LASER/STENT PLACEMENT Right 06/09/2015   Procedure: CYSTOSCOPY/URETEROSCOPY/STENT PLACEMENT REMOVAL LEFT URETERAL STENT;  Surgeon: Festus Aloe, MD;  Location: [facility];  Service: Urology;  Laterality: Right;  . HOLMIUM LASER APPLICATION Left [id number]   Procedure: HOLMIUM LASER APPLICATION;  Surgeon: Festus Aloe, MD;  Location: [facility];  Service: Urology;  Laterality: Left;  . HOLMIUM LASER APPLICATION Right 0000000   Procedure: HOLMIUM LASER APPLICATION;  Surgeon: Festus Aloe, MD;  Location: [facility];  Service: Urology;  Laterality: Right;  . INSERTION OF MESH N/A 09/05/2016   Procedure: INSERTION OF MESH;  Surgeon: [name], MD;  Location: [place];  Service: General;  Laterality: N/A;  . LAPAROSCOPIC ASSISTED VAGINAL HYSTERECTOMY  04-23-2002   and Left Salpingoophorectomy/  Anterior Repair/  Tension Free Tape Sling placement  . LASER ABLATION OF THE CERVIX  x2  1980's  . TUBAL LIGATION  1980's  . VENTRAL HERNIA REPAIR N/A 09/05/2016   Procedure: LAPAROSCOPIC VENTRAL HERNIA;  Surgeon: [name], MD;  Location: [place];  Service: General;  Laterality: N/A;   Family History  Problem Relation Age of Onset  . Alcohol abuse Mother   . Lung cancer Mother   . Arthritis Father   . Hyperlipidemia Father   .  Heart disease Father   . Hypertension Father   . Kidney disease Paternal Uncle   . Cancer Maternal Grandmother        colon  . Cancer Paternal Grandmother        breast   Social History   Tobacco Use  . Smoking status: Never Smoker  . Smokeless tobacco: Never Used  Substance Use Topics  . Alcohol use: No    Comment: 1 x year  . Drug use: No   Current Outpatient Medications  Medication Sig Dispense Refill  .  amphetamine-dextroamphetamine (ADDERALL) 30 MG tablet Take one tablet twice daily 60 tablet 0  . amphetamine-dextroamphetamine (ADDERALL) 30 MG tablet Take one tablet by mouth twice daily.  May refill in one month 60 tablet 0  . amphetamine-dextroamphetamine (ADDERALL) 30 MG tablet Take one tablet by mouth twice daily.   May refill in two months. 60 tablet 0  . atorvastatin (LIPITOR) 10 MG tablet TAKE 1 TABLET BY MOUTH DAILY 90 tablet 0  . Biotin 1000 MCG tablet Take 1,000 mcg by mouth 3 (three) times daily.    . celecoxib (CELEBREX) 100 MG capsule Take 1 capsule by mouth daily.    . citalopram (CELEXA) 20 MG tablet TAKE 3 TABLETS BY MOUTH EVERY NIGHT AT BEDTIME OR AS DIRECTED 90 tablet 3  . furosemide (LASIX) 20 MG tablet Take 1 tablet (20 mg total) by mouth daily as needed for edema. 30 tablet 1  . gabapentin (NEURONTIN) 300 MG capsule Take 1 capsule (300 mg total) by mouth 3 (three) times daily. 100 capsule 3  . levothyroxine (SYNTHROID) 50 MCG tablet TAKE 1 TABLET BY MOUTH DAILY 30 tablet 0  . losartan-hydrochlorothiazide (HYZAAR) 50-12.5 MG tablet Take 1 tablet by mouth daily. 90 tablet 3  . Multiple Vitamins-Minerals (HAIR SKIN AND NAILS FORMULA) TABS Take 2 tablets by mouth at bedtime.    . ondansetron (ZOFRAN ODT) 4 MG disintegrating tablet Take 1 tablet (4 mg total) by mouth every 8 (eight) hours as needed for nausea or vomiting. 20 tablet 0  . pantoprazole (PROTONIX) 40 MG tablet TAKE 1 TABLET(40 MG) BY MOUTH DAILY 90 tablet 1  . pramipexole (MIRAPEX) 0.125 MG tablet Take one to two tablets at night as needed for restless leg symptoms. 60 tablet 3  . SUMAtriptan (IMITREX) 100 MG tablet Take 1 tablet (100 mg total) by mouth every 2 (two) hours as needed for migraine. May repeat in 2 hours if headache persists or recurs. 10 tablet 11  . Vitamin D, Ergocalciferol, (DRISDOL) 1.25 MG (50000 UT) CAPS capsule TAKE 1 CAPSULE BY MOUTH 1 TIME A WEEK (Patient taking differently: Take 50,000 Units by  mouth every Monday. ) 12 capsule 3   No current facility-administered medications for this visit.    Allergies  Allergen Reactions  . Keflex [Cephalexin] Swelling    Tongue swelling     Review of Systems: All systems reviewed and negative except where noted in HPI.    No results found.  Physical Exam: There were no vitals taken for this visit. Constitutional: Pleasant,well-developed, ***female in no acute distress. HEENT: Normocephalic and atraumatic. Conjunctivae are normal. No scleral icterus. Neck supple.  Cardiovascular: Normal rate, regular rhythm.  Pulmonary/chest: Effort normal and breath sounds normal. No wheezing, rales or rhonchi. Abdominal: Soft, nondistended, nontender. Bowel sounds active throughout. There are no masses palpable. No hepatomegaly. Extremities: no edema Lymphadenopathy: No cervical adenopathy noted. Neurological: Alert and oriented to person place and time. Skin: Skin is warm and dry.  No rashes noted. Psychiatric: Normal mood and affect. Behavior is normal.   ASSESSMENT AND PLAN:  [name], MD

## 2019-04-15 ENCOUNTER — Other Ambulatory Visit: Payer: Self-pay | Admitting: Family Medicine

## 2019-04-16 ENCOUNTER — Telehealth (INDEPENDENT_AMBULATORY_CARE_PROVIDER_SITE_OTHER): Payer: Managed Care, Other (non HMO) | Admitting: Family Medicine

## 2019-04-16 ENCOUNTER — Other Ambulatory Visit: Payer: Self-pay

## 2019-04-16 DIAGNOSIS — F988 Other specified behavioral and emotional disorders with onset usually occurring in childhood and adolescence: Secondary | ICD-10-CM

## 2019-04-16 DIAGNOSIS — J0100 Acute maxillary sinusitis, unspecified: Secondary | ICD-10-CM

## 2019-04-16 DIAGNOSIS — G2581 Restless legs syndrome: Secondary | ICD-10-CM | POA: Diagnosis not present

## 2019-04-16 MED ORDER — AMPHETAMINE-DEXTROAMPHETAMINE 30 MG PO TABS: ORAL_TABLET | ORAL | 0 refills | Status: DC

## 2019-04-16 MED ORDER — DOXYCYCLINE HYCLATE 100 MG PO CAPS: 100.0000 mg | ORAL_CAPSULE | Freq: Two times a day (BID) | ORAL | 0 refills | Status: DC

## 2019-04-16 MED ORDER — HYDROCODONE-HOMATROPINE 5-1.5 MG/5ML PO SYRP: 5.0000 mL | ORAL_SOLUTION | Freq: Four times a day (QID) | ORAL | 0 refills | Status: AC | PRN

## 2019-04-16 NOTE — Progress Notes (Signed)
This visit type was conducted due to national recommendations for restrictions regarding the COVID-19 pandemic in an effort to limit this patient's exposure and mitigate transmission in our community.   Virtual Visit via Video Note  I connected with [name] Sherman on 04/16/19 at  1:15 PM EDT by a video enabled telemedicine application and verified that I am speaking with the correct person using two identifiers.  Location patient: home Location provider:work or home office Persons participating in the virtual visit: patient, provider  I discussed the limitations of evaluation and management by telemedicine and the availability of in person appointments. The patient expressed understanding and agreed to proceed.   HPI: Seen with approximately 1 week history of sinusitis type symptoms.  She states that her husband has very similar symptoms.  She states that she has been very isolated.  She has increasing facial pain maxillary and frontal sinus region bilaterally.  No fever.  No chills.  Increased malaise.  Occasional headaches.  Cough has been mostly nonproductive and sometimes severe at night.  Interfering with sleep.  She had a leftover cough syrup which helped.  Denies any dyspnea.  No diarrhea.  No loss of taste.  No recent travels.  Recently diagnosed with restless leg syndrome.  She started Mirapex and is taking 0.25 mg at night and this is working very well for her.  Sleeping much better overall until recent cough started.  She has attention deficit disorder.  Will need refills of Adderall in a little over 1 week and she is requesting these be sent in.   ROS: See pertinent positives and negatives per HPI.  Past Medical History:  Diagnosis Date  . ADD (attention deficit disorder)   . Anemia   . Anxiety   . Arthritis    shoulder, right (arthritis, tendonitis, bursitis)  . Cancer ([place])    cervical cancer (conization)   . Depression   . History of cervical cancer    s/p  laser  ablation of cervix 1980's  . History of kidney stones   . Hyperlipidemia   . Hypertension   . Hypothyroidism   . IBS (irritable bowel syndrome)   . Irregular heart rate    as a child, has never had any problems   . Left ureteral stone   . Migraine   . OSA (obstructive sleep apnea)    pt used cpap up until 2013 states lost wt and did not need anymore  . Restless legs   . Umbilical hernia without obstruction or gangrene   . Urgency of urination   . Vertigo   . Vitamin D deficiency     Past Surgical History:  Procedure Laterality Date  . ABDOMINAL HYSTERECTOMY    . BACK SURGERY  04/27/2018  . BUNIONECTOMY Right 2004  . COLONOSCOPY    . CYSTOSCOPY W/ RETROGRADES Bilateral 05/26/2015   Procedure: CYSTOSCOPY WITH RETROGRADE PYELOGRAM;  Surgeon: Festus Aloe, MD;  Location: [facility];  Service: Urology;  Laterality: Bilateral;  . CYSTOSCOPY W/ URETERAL STENT REMOVAL Left 05/26/2015   Procedure: CYSTOSCOPY WITH STENT REMOVAL;  Surgeon: Festus Aloe, MD;  Location: [facility];  Service: Urology;  Laterality: Left;  . CYSTOSCOPY WITH RETROGRADE PYELOGRAM, URETEROSCOPY AND STENT PLACEMENT Left 05/01/2015   Procedure: CYSTOSCOPY WITH RETROGRADE PYELOGRAM AND STENT PLACEMENT;  Surgeon: Festus Aloe, MD;  Location: [facility];  Service: Urology;  Laterality: Left;  . CYSTOSCOPY WITH STENT PLACEMENT Bilateral 05/26/2015   Procedure: CYSTOSCOPY WITH STENT PLACEMENT;  Surgeon: Rodman Key  Junious Silk, MD;  Location: [facility];  Service: Urology;  Laterality: Bilateral;  . CYSTOSCOPY WITH URETEROSCOPY Bilateral 05/26/2015   Procedure: CYSTOSCOPY WITH URETEROSCOPY;  Surgeon: Festus Aloe, MD;  Location: [facility];  Service: Urology;  Laterality: Bilateral;  . CYSTOSCOPY/URETEROSCOPY/HOLMIUM LASER/STENT PLACEMENT Right 06/09/2015   Procedure: CYSTOSCOPY/URETEROSCOPY/STENT PLACEMENT REMOVAL LEFT URETERAL STENT;  Surgeon: Festus Aloe, MD;  Location: [facility];  Service: Urology;  Laterality: Right;  . HOLMIUM LASER APPLICATION Left [id number]   Procedure: HOLMIUM LASER APPLICATION;  Surgeon: Festus Aloe, MD;  Location: [facility];  Service: Urology;  Laterality: Left;  . HOLMIUM LASER APPLICATION Right 0000000   Procedure: HOLMIUM LASER APPLICATION;  Surgeon: Festus Aloe, MD;  Location: [facility];  Service: Urology;  Laterality: Right;  . INSERTION OF MESH N/A 09/05/2016   Procedure: INSERTION OF MESH;  Surgeon: [name], MD;  Location: [place];  Service: General;  Laterality: N/A;  . LAPAROSCOPIC ASSISTED VAGINAL HYSTERECTOMY  04-23-2002   and Left Salpingoophorectomy/  Anterior Repair/  Tension Free Tape Sling placement  . LASER ABLATION OF THE CERVIX  x2  1980's  . TUBAL LIGATION  1980's  . VENTRAL HERNIA REPAIR N/A 09/05/2016   Procedure: LAPAROSCOPIC VENTRAL HERNIA;  Surgeon: [name], MD;  Location: [place];  Service: General;  Laterality: N/A;    Family History  Problem Relation Age of Onset  . Alcohol abuse Mother   . Lung cancer Mother   . Arthritis Father   . Hyperlipidemia Father   . Heart disease Father   . Hypertension Father   . Kidney disease Paternal Uncle   . Cancer Maternal Grandmother        colon  . Cancer Paternal Grandmother        breast    SOCIAL HX: Non-smoker   Current Outpatient Medications:  .  amphetamine-dextroamphetamine (ADDERALL) 30 MG tablet, Take one tablet twice daily.  May refill on or after Nov 2., Disp: 60 tablet, Rfl: 0 .  amphetamine-dextroamphetamine (ADDERALL) 30 MG tablet, Take one tablet by mouth twice daily.  May refill in one month, Disp: 60 tablet, Rfl: 0 .  amphetamine-dextroamphetamine (ADDERALL) 30 MG tablet, Take one tablet by mouth twice daily.   May refill in two months., Disp: 60 tablet, Rfl: 0 .  atorvastatin (LIPITOR) 10 MG tablet, TAKE 1 TABLET BY MOUTH DAILY, Disp: 90 tablet, Rfl: 0 .  Biotin 1000 MCG tablet,  Take 1,000 mcg by mouth 3 (three) times daily., Disp: , Rfl:  .  celecoxib (CELEBREX) 100 MG capsule, Take 1 capsule by mouth daily., Disp: , Rfl:  .  citalopram (CELEXA) 20 MG tablet, TAKE 3 TABLETS BY MOUTH EVERY NIGHT AT BEDTIME OR AS DIRECTED, Disp: 90 tablet, Rfl: 3 .  doxycycline (VIBRAMYCIN) 100 MG capsule, Take 1 capsule (100 mg total) by mouth 2 (two) times daily., Disp: 20 capsule, Rfl: 0 .  furosemide (LASIX) 20 MG tablet, Take 1 tablet (20 mg total) by mouth daily as needed for edema., Disp: 30 tablet, Rfl: 1 .  gabapentin (NEURONTIN) 300 MG capsule, Take 1 capsule (300 mg total) by mouth 3 (three) times daily., Disp: 100 capsule, Rfl: 3 .  HYDROcodone-homatropine (HYCODAN) 5-1.5 MG/5ML syrup, Take 5 mLs by mouth every 6 (six) hours as needed for up to 10 days., Disp: 120 mL, Rfl: 0 .  levothyroxine (SYNTHROID) 50 MCG tablet, TAKE 1 TABLET BY MOUTH DAILY, Disp: 30 tablet, Rfl: 0 .  losartan-hydrochlorothiazide (HYZAAR) 50-12.5 MG  tablet, Take 1 tablet by mouth daily., Disp: 90 tablet, Rfl: 3 .  Multiple Vitamins-Minerals (HAIR SKIN AND NAILS FORMULA) TABS, Take 2 tablets by mouth at bedtime., Disp: , Rfl:  .  ondansetron (ZOFRAN ODT) 4 MG disintegrating tablet, Take 1 tablet (4 mg total) by mouth every 8 (eight) hours as needed for nausea or vomiting., Disp: 20 tablet, Rfl: 0 .  pantoprazole (PROTONIX) 40 MG tablet, TAKE 1 TABLET(40 MG) BY MOUTH DAILY, Disp: 90 tablet, Rfl: 1 .  pramipexole (MIRAPEX) 0.125 MG tablet, Take one to two tablets at night as needed for restless leg symptoms., Disp: 60 tablet, Rfl: 3 .  SUMAtriptan (IMITREX) 100 MG tablet, Take 1 tablet (100 mg total) by mouth every 2 (two) hours as needed for migraine. May repeat in 2 hours if headache persists or recurs., Disp: 10 tablet, Rfl: 11 .  Vitamin D, Ergocalciferol, (DRISDOL) 1.25 MG (50000 UT) CAPS capsule, TAKE 1 CAPSULE BY MOUTH 1 TIME A WEEK (Patient taking differently: Take 50,000 Units by mouth every Monday. ),  Disp: 12 capsule, Rfl: 3  EXAM:  VITALS per patient if applicable:  GENERAL: alert, oriented, appears well and in no acute distress  HEENT: atraumatic, conjunttiva clear, no obvious abnormalities on inspection of external nose and ears  NECK: normal movements of the head and neck  LUNGS: on inspection no signs of respiratory distress, breathing rate appears normal, no obvious gross SOB, gasping or wheezing  CV: no obvious cyanosis  MS: moves all visible extremities without noticeable abnormality  PSYCH/NEURO: pleasant and cooperative, no obvious depression or anxiety, speech and thought processing grossly intact  ASSESSMENT AND PLAN:  Discussed the following assessment and plan:  #1 acute sinusitis symptoms.  We explained differentiating acute sinus infection from COVID-19 could be difficult.  She feels "certain "that she is not had any exposures that this is a sinusitis.  -She is aware of local testing sites and definitely get tested if she is having any shortness of breath, fever or other concerns -Agreed to start doxycycline 100 mg twice daily with food for 10 days -Refill limited Hycodan 1 teaspoon nightly for severe cough -Would suggest a minimum of 10 days of quarantine if she does not get tested  #2 attention deficit disorder  -Refill Adderall for 3 months  #3 restless leg syndrome improved on low-dose Mirapex  -Continue Mirapex 0.125 mg 1-2 at night     I discussed the assessment and treatment plan with the patient. The patient was provided an opportunity to ask questions and all were answered. The patient agreed with the plan and demonstrated an understanding of the instructions.   The patient was advised to call back or seek an in-person evaluation if the symptoms worsen or if the condition fails to improve as anticipated.     Carolann Littler, MD

## 2019-05-01 ENCOUNTER — Other Ambulatory Visit: Payer: Self-pay | Admitting: Family Medicine

## 2019-05-01 MED ORDER — LEVOTHYROXINE SODIUM 50 MCG PO TABS: 50.0000 ug | ORAL_TABLET | Freq: Every day | ORAL | 1 refills | Status: DC

## 2019-05-03 ENCOUNTER — Encounter: Payer: Self-pay | Admitting: Gastroenterology

## 2019-05-03 ENCOUNTER — Ambulatory Visit (INDEPENDENT_AMBULATORY_CARE_PROVIDER_SITE_OTHER): Payer: Managed Care, Other (non HMO) | Admitting: Gastroenterology

## 2019-05-03 VITALS — BP 122/74 | HR 88 | Temp 98.2°F | Ht 64.0 in | Wt 267.0 lb

## 2019-05-03 DIAGNOSIS — R110 Nausea: Secondary | ICD-10-CM | POA: Diagnosis not present

## 2019-05-03 DIAGNOSIS — Z1159 Encounter for screening for other viral diseases: Secondary | ICD-10-CM

## 2019-05-03 DIAGNOSIS — K219 Gastro-esophageal reflux disease without esophagitis: Secondary | ICD-10-CM | POA: Diagnosis not present

## 2019-05-03 DIAGNOSIS — R194 Change in bowel habit: Secondary | ICD-10-CM | POA: Diagnosis not present

## 2019-05-03 DIAGNOSIS — R1084 Generalized abdominal pain: Secondary | ICD-10-CM

## 2019-05-03 DIAGNOSIS — Z800 Family history of malignant neoplasm of digestive organs: Secondary | ICD-10-CM

## 2019-05-03 MED ORDER — PANTOPRAZOLE SODIUM 40 MG PO TBEC: 40.0000 mg | DELAYED_RELEASE_TABLET | Freq: Two times a day (BID) | ORAL | 2 refills | Status: DC

## 2019-05-03 MED ORDER — SUPREP BOWEL PREP KIT 17.5-3.13-1.6 GM/177ML PO SOLN: ORAL | 0 refills | Status: DC

## 2019-05-03 MED ORDER — ONDANSETRON 8 MG PO TBDP: 8.0000 mg | ORAL_TABLET | Freq: Three times a day (TID) | ORAL | 3 refills | Status: DC | PRN

## 2019-05-03 NOTE — Progress Notes (Signed)
HPI :  58 year old female with a history of cervical cancer, reported irritable bowel syndrome, arthritis, referred by Carolann Littler, MD for ongoing abdominal pain, nausea, reflux.  She states for the past at least 6 years she has had chronic discomfort in her mid abdomen.  She has had a ventral hernia repair x2 in 2018 which was thought to be potentially a source of her discomfort, she states the surgery did not help her at all.  She feels an achiness in her mid to left abdomen which is present all the time and never goes away.  Today she rates it as 8 out of 10, typically gets 5-6 out of 10.  She denies any worsening with positional changes.  She denies any change in her pain with bowel movements or eating.  She has had 2 back surgeries and now getting injections into her back for worsening back pain.  She has been on gabapentin 300 mg 3 times a day for chronic back pain and states this has not helped her abdomen at all.  She just started on Celebrex for this issue and unsure if this is helped yet.  She does have nausea frequently but does not vomit.  She uses Zofran occasionally and this does help her nausea when she takes it.  She denies any clear triggers to her nausea, does not necessarily occur with eating, can come and go at any time, sometimes with food, sometimes without.  She denies any postprandial pain.  She does have some ongoing reflux symptoms such as pyrosis and regurgitation which bother her.  She has been on pantoprazole 40 mg a day and her symptoms still persist despite this dosing.  She denies any dysphagia.  She thinks she has a history of irritable bowel syndrome.  She has frequent bowel movements after she eats which are typically normal form but often within minutes of eating she has urgency.  She denies any blood in her stools.  Her father had ulcerative colitis and her grandmother had colon cancer.  Her mother was diagnosed with gastric cancer in her 56s.  Her sister she  thinks had lung and renal cell carcinoma.  Over the past year she has gained about 60 pounds she thinks.  She had a CT scan as below in December 2019, she had hepatic steatosis and some questionable focal adenomyomatosis.  She previously had an ultrasound of her right upper quadrant which showed fatty liver no gallstones in 2018.  She thinks she had an EGD and colonoscopy both about 10 years ago, results of these are not known at this time.  CT abdomen pelvis 06/15/2018 - hepatic steatosis, mild hepatomegaly, mild thickening of the fundus ? Focal adenomyomatosis  RUQ [place] 03/24/17 - fatty liver, no gallstones   Past Medical History:  Diagnosis Date   ADD (attention deficit disorder)    Anemia    Anxiety    Arthritis    shoulder, right (arthritis, tendonitis, bursitis)   Cancer (HCC)    cervical cancer (conization)    Depression    History of cervical cancer    s/p  laser ablation of cervix 1980's   History of kidney stones    Hyperlipidemia    Hypertension    Hypothyroidism    IBS (irritable bowel syndrome)    Irregular heart rate    as a child, has never had any problems    Left ureteral stone    Migraine    OSA (obstructive sleep apnea)  pt used cpap up until 2013 states lost wt and did not need anymore   Restless legs    Umbilical hernia without obstruction or gangrene    Urgency of urination    Vertigo    Vitamin D deficiency      Past Surgical History:  Procedure Laterality Date   ABDOMINAL HYSTERECTOMY     BACK SURGERY  04/27/2018   BUNIONECTOMY Right 2004   COLONOSCOPY     CYSTOSCOPY W/ RETROGRADES Bilateral 05/26/2015   Procedure: CYSTOSCOPY WITH RETROGRADE PYELOGRAM;  Surgeon: Festus Aloe, MD;  Location: [facility];  Service: Urology;  Laterality: Bilateral;   CYSTOSCOPY W/ URETERAL STENT REMOVAL Left 05/26/2015   Procedure: CYSTOSCOPY WITH STENT REMOVAL;  Surgeon: Festus Aloe, MD;  Location: [facility];  Service: Urology;  Laterality: Left;   CYSTOSCOPY WITH RETROGRADE PYELOGRAM, URETEROSCOPY AND STENT PLACEMENT Left 05/01/2015   Procedure: CYSTOSCOPY WITH RETROGRADE PYELOGRAM AND STENT PLACEMENT;  Surgeon: Festus Aloe, MD;  Location: [facility];  Service: Urology;  Laterality: Left;   CYSTOSCOPY WITH STENT PLACEMENT Bilateral 05/26/2015   Procedure: CYSTOSCOPY WITH STENT PLACEMENT;  Surgeon: Festus Aloe, MD;  Location: [facility];  Service: Urology;  Laterality: Bilateral;   CYSTOSCOPY WITH URETEROSCOPY Bilateral 05/26/2015   Procedure: CYSTOSCOPY WITH URETEROSCOPY;  Surgeon: Festus Aloe, MD;  Location: [facility];  Service: Urology;  Laterality: Bilateral;   CYSTOSCOPY/URETEROSCOPY/HOLMIUM LASER/STENT PLACEMENT Right 06/09/2015   Procedure: CYSTOSCOPY/URETEROSCOPY/STENT PLACEMENT REMOVAL LEFT URETERAL STENT;  Surgeon: Festus Aloe, MD;  Location: [facility];  Service: Urology;  Laterality: Right;   HOLMIUM LASER APPLICATION Left [id number]   Procedure: HOLMIUM LASER APPLICATION;  Surgeon: Festus Aloe, MD;  Location: [facility];  Service: Urology;  Laterality: Left;   HOLMIUM LASER APPLICATION Right [id number]   Procedure: HOLMIUM LASER APPLICATION;  Surgeon: Festus Aloe, MD;  Location: [facility];  Service: Urology;  Laterality: Right;   INSERTION OF MESH N/A 09/05/2016   Procedure: INSERTION OF MESH;  Surgeon: [name], MD;  Location: [facility];  Service: General;  Laterality: N/A;   LAPAROSCOPIC ASSISTED VAGINAL HYSTERECTOMY  04-23-2002   and Left Salpingoophorectomy/  Anterior Repair/  Tension Free Tape Sling placement   LASER ABLATION OF THE CERVIX  x2  1980's   TUBAL LIGATION  1980's   VENTRAL HERNIA REPAIR N/A 09/05/2016   Procedure: LAPAROSCOPIC VENTRAL HERNIA;  Surgeon: [name], MD;  Location: [facility];  Service: General;  Laterality: N/A;   Family History  Problem Relation Age of  Onset   Alcohol abuse Mother    Lung cancer Mother    Arthritis Father    Hyperlipidemia Father    Heart disease Father    Hypertension Father    Kidney disease Paternal Uncle    Cancer Maternal Grandmother        colon   Cancer Paternal Grandmother        breast   Social History   Tobacco Use   Smoking status: Never Smoker   Smokeless tobacco: Never Used  Substance Use Topics   Alcohol use: No    Comment: 1 x year   Drug use: No   Current Outpatient Medications  Medication Sig Dispense Refill   amphetamine-dextroamphetamine (ADDERALL) 30 MG tablet Take one tablet twice daily.  May refill on or after Nov 2. 60 tablet 0   atorvastatin (LIPITOR) 10 MG tablet TAKE 1 TABLET BY MOUTH DAILY 90 tablet 0   Biotin 1000 MCG tablet  Take 1,000 mcg by mouth 3 (three) times daily.     celecoxib (CELEBREX) 100 MG capsule Take 1 capsule by mouth daily.     citalopram (CELEXA) 20 MG tablet TAKE 3 TABLETS BY MOUTH EVERY NIGHT AT BEDTIME OR AS DIRECTED 90 tablet 3   furosemide (LASIX) 20 MG tablet TAKE 1 TABLET(20 MG) BY MOUTH DAILY AS NEEDED FOR SWELLING 30 tablet 1   gabapentin (NEURONTIN) 300 MG capsule Take 1 capsule (300 mg total) by mouth 3 (three) times daily. 100 capsule 3   levothyroxine (SYNTHROID) 50 MCG tablet Take 1 tablet (50 mcg total) by mouth daily. 30 tablet 1   losartan-hydrochlorothiazide (HYZAAR) 50-12.5 MG tablet Take 1 tablet by mouth daily. 90 tablet 3   Multiple Vitamins-Minerals (HAIR SKIN AND NAILS FORMULA) TABS Take 2 tablets by mouth at bedtime.     ondansetron (ZOFRAN ODT) 4 MG disintegrating tablet Take 1 tablet (4 mg total) by mouth every 8 (eight) hours as needed for nausea or vomiting. 20 tablet 0   pantoprazole (PROTONIX) 40 MG tablet TAKE 1 TABLET(40 MG) BY MOUTH DAILY 90 tablet 1   pramipexole (MIRAPEX) 0.125 MG tablet Take one to two tablets at night as needed for restless leg symptoms. 60 tablet 3   SUMAtriptan (IMITREX) 100 MG  tablet Take 1 tablet (100 mg total) by mouth every 2 (two) hours as needed for migraine. May repeat in 2 hours if headache persists or recurs. 10 tablet 11   Vitamin D, Ergocalciferol, (DRISDOL) 1.25 MG (50000 UT) CAPS capsule TAKE 1 CAPSULE BY MOUTH 1 TIME A WEEK (Patient taking differently: Take 50,000 Units by mouth every Monday. ) 12 capsule 3   No current facility-administered medications for this visit.    Allergies  Allergen Reactions   Keflex [Cephalexin] Swelling    Tongue swelling     Review of Systems: All systems reviewed and negative except where noted in HPI.   Lab Results  Component Value Date   WBC 9.9 03/22/2019   HGB 12.0 03/22/2019   HCT 37.1 03/22/2019   MCV 79.6 03/22/2019   PLT 337.0 03/22/2019    Lab Results  Component Value Date   CREATININE 0.86 03/22/2019   BUN 14 03/22/2019   NA 141 03/22/2019   K 4.0 03/22/2019   CL 106 03/22/2019   CO2 27 03/22/2019    Lab Results  Component Value Date   ALT 20 03/22/2019   AST 14 03/22/2019   ALKPHOS 138 (H) 03/22/2019   BILITOT 0.3 03/22/2019     Physical Exam: BP 122/74 (BP Location: Left Arm, Patient Position: Sitting, Cuff Size: Normal) Comment (Cuff Size): forearm   Pulse 88    Temp 98.2 F (36.8 C) (Oral)    Ht 5\' 4"  (1.626 m)    Wt 267 lb (121.1 kg)    BMI 45.83 kg/m  Constitutional: Pleasant,well-developed, female in no acute distress. HEENT: Normocephalic and atraumatic. Conjunctivae are normal. No scleral icterus. Neck supple.  Cardiovascular: Normal rate, regular rhythm.  Pulmonary/chest: Effort normal and breath sounds normal. No wheezing, rales or rhonchi. Abdominal: Soft, protuberant, tenderness to mid abdomen - no rash, negative [place], diffusely tender without peritoneal signs. There are no masses palpable. No hepatomegaly. Extremities: no edema Lymphadenopathy: No cervical adenopathy noted. Neurological: Alert and oriented to person place and time. Skin: Skin is warm and dry. No  rashes noted. Psychiatric: Normal mood and affect. Behavior is normal.   ASSESSMENT AND PLAN: 58 year old female here for new patient assessment of  the following:  Chronic abdominal pain / chronic nausea / GERD / family history of gastric cancer / morbid obesity - as above, patient with years worth of abdominal discomfort and other upper tract symptoms as outlined.  She has had hernia repaired twice, last in 2018, which did not provide any benefit to her pain.  She had an ultrasound and CT scan in the past 1 to 2 years which did not show a clear cause.  Her labs are otherwise pretty normal and reassuring.  We discussed differential diagnosis for her pain.  She does have chronic back pain, and neuromuscular pain is quite possible in her abdominal wall, she is quite tender to palpation.  However, given her chronic nausea, and persistent reflux despite appropriate therapy, and family history of gastric cancer, recommend an EGD to further evaluate, clear her upper GI tract, rule out H. pylori, PUD, large hiatal hernia, celiac, etc. I discussed risks and benefits of endoscopy and anesthesia with her and she wanted to proceed.  In the interim recommend she increase her Protonix to 40 mg twice a day and I would increase her Zofran to 8 mg and use this more frequently if it helps.  If this exam does not show any clear cause for her pain, we may consider trigger point injection of the abdominal wall, and potentially adding another option to her pain regimen such as Cymbalta or TCA.  If this pain is deemed to be neuromuscular, weight loss in general should help this in place less pressure on the abdominal wall. She agreed  Altered bowel habits - postprandial urgency with her stools which is longstanding.  Her bowels are not in any way related to her pain as she describes them today.  Sounds like she is due for a screening colonoscopy given her last exam was around 10 years ago, in light of her bowel symptoms and pain  recommend optical colonoscopy, will evaluate for polyps, rule out microscopic colitis, etc.  I discussed risks and benefits of this exam and she wanted to proceed at the same time as her endoscopy.  Further recommendations pending results and her course.  Sandia Cellar, MD [facility]  CC: [name], MD

## 2019-05-03 NOTE — Patient Instructions (Addendum)
If you are age 56 or older, your body mass index should be between 23-30. Your Body mass index is 45.83 kg/m. If this is out of the aforementioned range listed, please consider follow up with your Primary Care Provider.  If you are age 78 or younger, your body mass index should be between 19-25. Your Body mass index is 45.83 kg/m. If this is out of the aformentioned range listed, please consider follow up with your Primary Care Provider.   To help prevent the possible spread of infection to our patients, communities, and staff; we will be implementing the following measures:  As of now we are not allowing any visitors/family members to accompany you to any upcoming appointments with [facility]. If you have any concerns about this please contact our office to discuss prior to the appointment.   You have been scheduled for an endoscopy and colonoscopy. Please follow the written instructions given to you at your visit today. Please pick up your prep supplies at the pharmacy within the next 1-3 days. If you use inhalers (even only as needed), please bring them with you on the day of your procedure. Your physician has requested that you go to www.startemmi.com and enter the access code given to you at your visit today. This web site gives a general overview about your procedure. However, you should still follow specific instructions given to you by our office regarding your preparation for the procedure.  We have sent the following medications to your pharmacy for you to pick up at your convenience: Protonix 40mg : Take twice a day  Zofran 8mg  ODT: Take every 8 hours as needed  Thank you for entrusting me with your care and for choosing [employer], Dr. Eleele Cellar

## 2019-05-06 LAB — SARS CORONAVIRUS 2 (TAT 6-24 HRS): SARS Coronavirus 2: NEGATIVE

## 2019-05-07 ENCOUNTER — Ambulatory Visit (AMBULATORY_SURGERY_CENTER): Payer: Managed Care, Other (non HMO) | Admitting: Gastroenterology

## 2019-05-07 ENCOUNTER — Other Ambulatory Visit: Payer: Self-pay

## 2019-05-07 ENCOUNTER — Encounter: Payer: Self-pay | Admitting: Gastroenterology

## 2019-05-07 VITALS — BP 144/96 | HR 65 | Temp 98.6°F | Resp 18 | Ht 64.0 in | Wt 267.0 lb

## 2019-05-07 DIAGNOSIS — K219 Gastro-esophageal reflux disease without esophagitis: Secondary | ICD-10-CM

## 2019-05-07 DIAGNOSIS — R194 Change in bowel habit: Secondary | ICD-10-CM

## 2019-05-07 DIAGNOSIS — K648 Other hemorrhoids: Secondary | ICD-10-CM

## 2019-05-07 DIAGNOSIS — K449 Diaphragmatic hernia without obstruction or gangrene: Secondary | ICD-10-CM | POA: Diagnosis not present

## 2019-05-07 DIAGNOSIS — Z800 Family history of malignant neoplasm of digestive organs: Secondary | ICD-10-CM

## 2019-05-07 DIAGNOSIS — R110 Nausea: Secondary | ICD-10-CM

## 2019-05-07 DIAGNOSIS — D120 Benign neoplasm of cecum: Secondary | ICD-10-CM

## 2019-05-07 DIAGNOSIS — R1084 Generalized abdominal pain: Secondary | ICD-10-CM

## 2019-05-07 MED ORDER — SODIUM CHLORIDE 0.9 % IV SOLN: 500.0000 mL | Freq: Once | INTRAVENOUS | Status: DC

## 2019-05-07 NOTE — Progress Notes (Signed)
Called to room to assist during endoscopic procedure.  Patient ID and intended procedure confirmed with present staff. Received instructions for my participation in the procedure from the performing physician.  

## 2019-05-07 NOTE — Op Note (Signed)
[facility] Patient Name: [name] Sherman Procedure Date: 05/07/2019 2:22 PM MRN: [id number] Endoscopist: Remo Lipps P. Havery Moros , MD Age: 58 Referring MD:  Date of Birth: December 27, 1960 Gender: Female Account #: [id number] Procedure:                Upper GI endoscopy Indications:              Generalized abdominal pain, gastro-esophageal                            reflux disease, nausea, altered bowel habits,                            family history of gastric cancer - protonix                            recently increased to 40mg  twice daily Medicines:                Monitored Anesthesia Care Procedure:                Pre-Anesthesia Assessment:                           - Prior to the procedure, a History and Physical                            was performed, and patient medications and                            allergies were reviewed. The patient's tolerance of                            previous anesthesia was also reviewed. The risks                            and benefits of the procedure and the sedation                            options and risks were discussed with the patient.                            All questions were answered, and informed consent                            was obtained. Prior Anticoagulants: The patient has                            taken no previous anticoagulant or antiplatelet                            agents. ASA Grade Assessment: III - A patient with                            severe systemic disease. After reviewing the risks  and benefits, the patient was deemed in                            satisfactory condition to undergo the procedure.                           After obtaining informed consent, the endoscope was                            passed under direct vision. Throughout the                            procedure, the patient's blood pressure, pulse, and                            oxygen saturations  were monitored continuously. The                            Endoscope was introduced through the mouth, and                            advanced to the second part of duodenum. The upper                            GI endoscopy was accomplished without difficulty.                            The patient tolerated the procedure well. Scope In: Scope Out: Findings:                 Esophagogastric landmarks were identified: the                            Z-line was found at 35 cm, the gastroesophageal                            junction was found at 34-35 cm and the upper extent                            of the gastric folds was found at 35 cm from the                            incisors. 1cm sliding hiatal hernia                           The exam of the esophagus was otherwise normal.                           The entire examined stomach was normal. Biopsies                            were taken with a cold forceps for Helicobacter  pylori testing.                           The duodenal bulb and second portion of the                            duodenum were normal. Biopsies for histology were                            taken with a cold forceps for evaluation of celiac                            disease. Complications:            No immediate complications. Estimated blood loss:                            Minimal. Estimated Blood Loss:     Estimated blood loss was minimal. Impression:               - Esophagogastric landmarks identified.                           - 1cm sliding hiatal hernia                           - Normal esophagus.                           - Normal stomach. Biopsied to rule out H pylori.                           - Normal duodenal bulb and second portion of the                            duodenum. Biopsied to rule out celiac disease.                           No cause for abdominal pain on this exam, will                            await  biopsies. Musculoskeletal / neuropathic pain                            otherwise possible as cause for discomfort. Recommendation:           - Patient has a contact number available for                            emergencies. The signs and symptoms of potential                            delayed complications were discussed with the                            patient. Return to normal activities tomorrow.  Written discharge instructions were provided to the                            patient.                           - Resume previous diet.                           - Continue present medications.                           - Await pathology results. Remo Lipps P. Emaree Chiu, MD 05/07/2019 3:13:35 PM This report has been signed electronically.

## 2019-05-07 NOTE — Patient Instructions (Signed)
Impression/Recommendations:  Hiatal hernia handout given to patient. Polyp handout given to patient. Hemorrhoid handout given to patient.  Resume previous diet. Continue present medications.  Await pathology results.  YOU HAD AN ENDOSCOPIC PROCEDURE TODAY AT [facility]:   Refer to the procedure report that was given to you for any specific questions about what was found during the examination.  If the procedure report does not answer your questions, please call your gastroenterologist to clarify.  If you requested that your care partner not be given the details of your procedure findings, then the procedure report has been included in a sealed envelope for you to review at your convenience later.  YOU SHOULD EXPECT: Some feelings of bloating in the abdomen. Passage of more gas than usual.  Walking can help get rid of the air that was put into your GI tract during the procedure and reduce the bloating. If you had a lower endoscopy (such as a colonoscopy or flexible sigmoidoscopy) you may notice spotting of blood in your stool or on the toilet paper. If you underwent a bowel prep for your procedure, you may not have a normal bowel movement for a few days.  Please Note:  You might notice some irritation and congestion in your nose or some drainage.  This is from the oxygen used during your procedure.  There is no need for concern and it should clear up in a day or so.  SYMPTOMS TO REPORT IMMEDIATELY:   Following lower endoscopy (colonoscopy or flexible sigmoidoscopy):  Excessive amounts of blood in the stool  Significant tenderness or worsening of abdominal pains  Swelling of the abdomen that is new, acute  Fever of 100F or higher   Following upper endoscopy (EGD)  Vomiting of blood or coffee ground material  New chest pain or pain under the shoulder blades  Painful or persistently difficult swallowing  New shortness of breath  Fever of 100F or higher  Black,  tarry-looking stools  For urgent or emergent issues, a gastroenterologist can be reached at any hour by calling [phone].   DIET:  We do recommend a small meal at first, but then you may proceed to your regular diet.  Drink plenty of fluids but you should avoid alcoholic beverages for 24 hours.  ACTIVITY:  You should plan to take it easy for the rest of today and you should NOT DRIVE or use heavy machinery until tomorrow (because of the sedation medicines used during the test).    FOLLOW UP: Our staff will call the number listed on your records 48-72 hours following your procedure to check on you and address any questions or concerns that you may have regarding the information given to you following your procedure. If we do not reach you, we will leave a message.  We will attempt to reach you two times.  During this call, we will ask if you have developed any symptoms of COVID 19. If you develop any symptoms (ie: fever, flu-like symptoms, shortness of breath, cough etc.) before then, please call [phone].  If you test positive for Covid 19 in the 2 weeks post procedure, please call and report this information to [place].    If any biopsies were taken you will be contacted by phone or by letter within the next 1-3 weeks.  Please call us at [phone] if you have not heard about the biopsies in 3 weeks.    SIGNATURES/CONFIDENTIALITY: You and/or your care partner have signed paperwork which  will be entered into your electronic medical record.  These signatures attest to the fact that that the information above on your After Visit Summary has been reviewed and is understood.  Full responsibility of the confidentiality of this discharge information lies with you and/or your care-partner.

## 2019-05-07 NOTE — Progress Notes (Signed)
Pt. Reports her stomach hurts.  Responded to questioning that it is the same as it hurt prior to her procedure.

## 2019-05-07 NOTE — Progress Notes (Signed)
PT taken to PACU. Monitors in place. VSS. Report given to RN. 

## 2019-05-07 NOTE — Progress Notes (Signed)
VS-[name] Sherman Temperature- [name] Sherman   

## 2019-05-07 NOTE — Op Note (Signed)
[facility] Patient Name: [name] Sherman Procedure Date: 05/07/2019 2:22 PM MRN: [id number] Endoscopist: Remo Lipps P. Havery Moros , MD Age: 58 Referring MD:  Date of Birth: 30-Nov-1960 Gender: Female Account #: [id number] Procedure:                Colonoscopy Indications:              Abdominal pain, altered bowel habits - significant                            urgency of bowels Medicines:                Monitored Anesthesia Care Procedure:                Pre-Anesthesia Assessment:                           - Prior to the procedure, a History and Physical                            was performed, and patient medications and                            allergies were reviewed. The patient's tolerance of                            previous anesthesia was also reviewed. The risks                            and benefits of the procedure and the sedation                            options and risks were discussed with the patient.                            All questions were answered, and informed consent                            was obtained. Prior Anticoagulants: The patient has                            taken no previous anticoagulant or antiplatelet                            agents. ASA Grade Assessment: III - A patient with                            severe systemic disease. After reviewing the risks                            and benefits, the patient was deemed in                            satisfactory condition to undergo the procedure.  After obtaining informed consent, the colonoscope                            was passed under direct vision. Throughout the                            procedure, the patient's blood pressure, pulse, and                            oxygen saturations were monitored continuously. The                            Colonoscope was introduced through the anus and                            advanced to the the terminal  ileum, with                            identification of the appendiceal orifice and IC                            valve. The colonoscopy was performed without                            difficulty. The patient tolerated the procedure                            well. The quality of the bowel preparation was                            adequate. The terminal ileum, ileocecal valve,                            appendiceal orifice, and rectum were photographed. Scope In: 2:38:40 PM Scope Out: 3:00:13 PM Scope Withdrawal Time: 0 hours 17 minutes 15 seconds  Total Procedure Duration: 0 hours 21 minutes 33 seconds  Findings:                 The perianal and digital rectal examinations were                            normal.                           The terminal ileum appeared normal.                           Two sessile polyps were found in the cecum. The                            polyps were 3 mm in size. These polyps were removed                            with a cold snare. Resection and retrieval were  complete.                           A large amount of liquid stool was found in the                            entire colon, making visualization difficult                            initially, particularly in the right colon. Lavage                            of the area was performed using copious amounts of                            sterile water, resulting in clearance with adequate                            visualization.                           Internal hemorrhoids were found during retroflexion.                           The exam was otherwise without abnormality.                           Biopsies for histology were taken with a cold                            forceps from the right colon, left colon and                            transverse colon for evaluation of microscopic                            colitis. Complications:            No immediate  complications. Estimated blood loss:                            Minimal. Estimated Blood Loss:     Estimated blood loss was minimal. Impression:               - The examined portion of the ileum was normal.                           - Two 3 mm polyps in the cecum, removed with a cold                            snare. Resected and retrieved.                           - Liquid residual stool noted throughout, worst in  right colon, extensive lavage done to obtain                            adequate views.                           - Internal hemorrhoids.                           - The examination was otherwise normal.                           - Biopsies were taken with a cold forceps from the                            right colon, left colon and transverse colon for                            evaluation of microscopic colitis.                           No clear cause for abdominal pain on this exam,                            will await biopsy results from EGD and colonoscopy,                            pain could otherwise be neuromuscular as discussed                            in clinic. Recommendation:           - Patient has a contact number available for                            emergencies. The signs and symptoms of potential                            delayed complications were discussed with the                            patient. Return to normal activities tomorrow.                            Written discharge instructions were provided to the                            patient.                           - Resume previous diet.                           - Continue present medications.                           -  Await pathology results. Remo Lipps P. Armbruster, MD 05/07/2019 3:07:34 PM This report has been signed electronically.

## 2019-05-09 ENCOUNTER — Telehealth: Payer: Self-pay

## 2019-05-09 NOTE — Telephone Encounter (Signed)
  Follow up Call-  Call back number 05/07/2019  Post procedure Call Back phone  # [phone]  Permission to leave phone message Yes  Some recent data might be hidden     Patient questions:  Do you have a fever, pain , or abdominal swelling? No. Pain Score  0 *  Have you tolerated food without any problems? Yes.    Have you been able to return to your normal activities? Yes.    Do you have any questions about your discharge instructions: Diet   No. Medications  No. Follow up visit  No.  Do you have questions or concerns about your Care? No.  Actions: * If pain score is 4 or above: No action needed, pain <4.  1. Have you developed a fever since your procedure? no  2.   Have you had an respiratory symptoms (SOB or cough) since your procedure? no  3.   Have you tested positive for COVID 19 since your procedure? no  4.   Have you had any family members/close contacts diagnosed with the COVID 19 since your procedure?  no   If yes to any of these questions please route to Joylene John, RN and Alphonsa Gin, Therapist, sports.

## 2019-05-13 ENCOUNTER — Other Ambulatory Visit: Payer: Self-pay | Admitting: Family Medicine

## 2019-05-15 ENCOUNTER — Other Ambulatory Visit (HOSPITAL_COMMUNITY): Payer: Self-pay | Admitting: Neurological Surgery

## 2019-05-15 ENCOUNTER — Other Ambulatory Visit: Payer: Self-pay | Admitting: Neurological Surgery

## 2019-05-15 DIAGNOSIS — M5416 Radiculopathy, lumbar region: Secondary | ICD-10-CM

## 2019-05-17 ENCOUNTER — Other Ambulatory Visit: Payer: Self-pay

## 2019-05-17 ENCOUNTER — Telehealth (INDEPENDENT_AMBULATORY_CARE_PROVIDER_SITE_OTHER): Payer: Managed Care, Other (non HMO) | Admitting: Family Medicine

## 2019-05-17 DIAGNOSIS — R109 Unspecified abdominal pain: Secondary | ICD-10-CM

## 2019-05-17 DIAGNOSIS — G8929 Other chronic pain: Secondary | ICD-10-CM | POA: Diagnosis not present

## 2019-05-17 MED ORDER — DULOXETINE HCL 30 MG PO CPEP: ORAL_CAPSULE | ORAL | 3 refills | Status: DC

## 2019-05-17 NOTE — Progress Notes (Signed)
This visit type was conducted due to national recommendations for restrictions regarding the COVID-19 pandemic in an effort to limit this patient's exposure and mitigate transmission in our community.   Virtual Visit via Video Note  I connected with [name] Sherman on 05/17/19 at 10:45 AM EST by a video enabled telemedicine application and verified that I am speaking with the correct person using two identifiers.  Location patient: home Location provider:work or home office Persons participating in the virtual visit: patient, provider  I discussed the limitations of evaluation and management by telemedicine and the availability of in person appointments. The patient expressed understanding and agreed to proceed.   HPI: [name] Sherman seen to discuss medications.  She has had some chronic abdominal pain and had recent EGD and colonoscopy.  She had evidence for hiatal hernia and couple of polyps in her cecum.  No clear explanation for her pain issues.  She had biopsies of stomach and small bowel with no clear explanation for her symptoms.  Felt by GI she may be having neuropathic or musculoskeletal pain.  She has had other extensive work-up in the past including CT the abdomen pelvis.  Gastroenterologist had proposed possible Cymbalta or TCA such as Elavil for symptoms but noted patient is on Celexa.  She has been on this for apparently several years and states she was placed on this initially for "migraines "she still has occasional headaches even with the Celexa.  She is calling to discuss possible change to medication such as Cymbalta.  She has had significant weight gain over recent years and would like to avoid TCAs if possible because of increased risk of weight gain with that class.  Denies any current depression issues   ROS: See pertinent positives and negatives per HPI.  Past Medical History:  Diagnosis Date  . ADD (attention deficit disorder)   . Anemia   . Anxiety   . Arthritis    shoulder,  right (arthritis, tendonitis, bursitis)  . Cancer ([employer])    cervical cancer (conization)   . Chronic kidney disease   . Depression   . GERD (gastroesophageal reflux disease)   . History of cervical cancer    s/p  laser ablation of cervix 1980's  . History of kidney stones   . Hyperlipidemia   . Hypertension   . Hypothyroidism   . IBS (irritable bowel syndrome)   . Irregular heart rate    as a child, has never had any problems   . Kidney stones   . Left ureteral stone   . Migraine   . OSA (obstructive sleep apnea)    pt used cpap up until 2013 states lost wt and did not need anymore  . Restless legs   . Sleep apnea    wears CPAP  . Umbilical hernia without obstruction or gangrene   . Urgency of urination   . Vertigo   . Vitamin D deficiency     Past Surgical History:  Procedure Laterality Date  . ABDOMINAL HYSTERECTOMY    . BACK SURGERY  04/27/2018  . BUNIONECTOMY Right 2004  . COLONOSCOPY    . CYSTOSCOPY W/ RETROGRADES Bilateral 05/26/2015   Procedure: CYSTOSCOPY WITH RETROGRADE PYELOGRAM;  Surgeon: Festus Aloe, MD;  Location[facility];  Service: Urology;  Laterality: Bilateral;  . CYSTOSCOPY W/ URETERAL STENT REMOVAL Left 05/26/2015   Procedure: CYSTOSCOPY WITH STENT REMOVAL;  Surgeon: Festus Aloe, MD;  Location: [facility];  Service: Urology;  Laterality: Left;  . CYSTOSCOPY WITH  RETROGRADE PYELOGRAM, URETEROSCOPY AND STENT PLACEMENT Left 05/01/2015   Procedure: CYSTOSCOPY WITH RETROGRADE PYELOGRAM AND STENT PLACEMENT;  Surgeon: Festus Aloe, MD;  Location: [facility];  Service: Urology;  Laterality: Left;  . CYSTOSCOPY WITH STENT PLACEMENT Bilateral 05/26/2015   Procedure: CYSTOSCOPY WITH STENT PLACEMENT;  Surgeon: Festus Aloe, MD;  Location: [facility];  Service: Urology;  Laterality: Bilateral;  . CYSTOSCOPY WITH URETEROSCOPY Bilateral 05/26/2015   Procedure: CYSTOSCOPY WITH URETEROSCOPY;  Surgeon: Festus Aloe, MD;  Location: [facility];  Service: Urology;  Laterality: Bilateral;  . CYSTOSCOPY/URETEROSCOPY/HOLMIUM LASER/STENT PLACEMENT Right 06/09/2015   Procedure: CYSTOSCOPY/URETEROSCOPY/STENT PLACEMENT REMOVAL LEFT URETERAL STENT;  Surgeon: Festus Aloe, MD;  Location: [facility];  Service: Urology;  Laterality: Right;  . HOLMIUM LASER APPLICATION Left 30/16/0109   Procedure: HOLMIUM LASER APPLICATION;  Surgeon: Festus Aloe, MD;  Location: [facility];  Service: Urology;  Laterality: Left;  . HOLMIUM LASER APPLICATION Right 32/35/5732   Procedure: HOLMIUM LASER APPLICATION;  Surgeon: Festus Aloe, MD;  Location: [facility];  Service: Urology;  Laterality: Right;  . INSERTION OF MESH N/A 09/05/2016   Procedure: INSERTION OF MESH;  Surgeon: [name], MD;  Location: [facility];  Service: General;  Laterality: N/A;  . LAPAROSCOPIC ASSISTED VAGINAL HYSTERECTOMY  04-23-2002   and Left Salpingoophorectomy/  Anterior Repair/  Tension Free Tape Sling placement  . LASER ABLATION OF THE CERVIX  x2  1980's  . TUBAL LIGATION  1980's  . VENTRAL HERNIA REPAIR N/A 09/05/2016   Procedure: LAPAROSCOPIC VENTRAL HERNIA;  Surgeon: [name], MD;  Location: [facility];  Service: General;  Laterality: N/A;    Family History  Problem Relation Age of Onset  . Alcohol abuse Mother   . Lung cancer Mother   . Colon cancer Mother   . Arthritis Father   . Hyperlipidemia Father   . Heart disease Father   . Hypertension Father   . Kidney disease Paternal Uncle   . Cancer Maternal Grandmother        colon  . Colon cancer Maternal Grandmother   . Rectal cancer Maternal Grandmother   . Cancer Paternal Grandmother        breast  . Esophageal cancer Neg Hx   . Stomach cancer Neg Hx     SOCIAL HX: Non-smoker   Current Outpatient Medications:  .  amphetamine-dextroamphetamine (ADDERALL) 30 MG tablet, Take one tablet twice daily.  May refill on or after Nov 2., Disp: 60  tablet, Rfl: 0 .  atorvastatin (LIPITOR) 10 MG tablet, TAKE 1 TABLET BY MOUTH DAILY, Disp: 90 tablet, Rfl: 0 .  Biotin 1000 MCG tablet, Take 1,000 mcg by mouth 3 (three) times daily., Disp: , Rfl:  .  celecoxib (CELEBREX) 100 MG capsule, Take 1 capsule by mouth daily., Disp: , Rfl:  .  DULoxetine (CYMBALTA) 30 MG capsule, Start 1 tablet daily for 1 week and then increase to 2 tablets daily, Disp: 60 capsule, Rfl: 3 .  furosemide (LASIX) 20 MG tablet, TAKE 1 TABLET(20 MG) BY MOUTH DAILY AS NEEDED FOR SWELLING, Disp: 30 tablet, Rfl: 1 .  gabapentin (NEURONTIN) 300 MG capsule, Take 1 capsule (300 mg total) by mouth 3 (three) times daily., Disp: 100 capsule, Rfl: 3 .  levothyroxine (SYNTHROID) 50 MCG tablet, Take 1 tablet (50 mcg total) by mouth daily., Disp: 30 tablet, Rfl: 1 .  losartan-hydrochlorothiazide (HYZAAR) 50-12.5 MG tablet, Take 1 tablet by mouth daily., Disp: 90 tablet, Rfl: 3 .  Multiple  Vitamins-Minerals (HAIR SKIN AND NAILS FORMULA) TABS, Take 2 tablets by mouth at bedtime., Disp: , Rfl:  .  ondansetron (ZOFRAN-ODT) 8 MG disintegrating tablet, Take 1 tablet (8 mg total) by mouth every 8 (eight) hours as needed for nausea or vomiting., Disp: 60 tablet, Rfl: 3 .  pantoprazole (PROTONIX) 40 MG tablet, Take 1 tablet (40 mg total) by mouth 2 (two) times daily., Disp: 60 tablet, Rfl: 2 .  pramipexole (MIRAPEX) 0.125 MG tablet, Take one to two tablets at night as needed for restless leg symptoms., Disp: 60 tablet, Rfl: 3 .  SUMAtriptan (IMITREX) 100 MG tablet, Take 1 tablet (100 mg total) by mouth every 2 (two) hours as needed for migraine. May repeat in 2 hours if headache persists or recurs., Disp: 10 tablet, Rfl: 11 .  SUPREP BOWEL PREP KIT 17.5-3.13-1.6 GM/177ML SOLN, Suprep-Use as directed, Disp: 354 mL, Rfl: 0 .  Vitamin D, Ergocalciferol, (DRISDOL) 1.25 MG (50000 UT) CAPS capsule, TAKE 1 CAPSULE BY MOUTH 1 TIME A WEEK (Patient taking differently: Take 50,000 Units by mouth every Monday. ),  Disp: 12 capsule, Rfl: 3  EXAM:  VITALS per patient if applicable:  GENERAL: alert, oriented, appears well and in no acute distress  HEENT: atraumatic, conjunttiva clear, no obvious abnormalities on inspection of external nose and ears  NECK: normal movements of the head and neck  LUNGS: on inspection no signs of respiratory distress, breathing rate appears normal, no obvious gross SOB, gasping or wheezing  CV: no obvious cyanosis  MS: moves all visible extremities without noticeable abnormality  PSYCH/NEURO: pleasant and cooperative, no obvious depression or anxiety, speech and thought processing grossly intact  ASSESSMENT AND PLAN:  Discussed the following assessment and plan:  Chronic abdominal pain.  Question neuropathic or musculoskeletal component  -We discussed tapering off Celexa.  She will try to taper down to 40 mg daily for 1 week then 20 mg daily for 1 week then discontinue. -We will start Cymbalta 30 mg daily after stopping her Celexa and continue that for 1 week and if tolerated well then increase to 60 mg daily and touch base a month after making that change for follow-up     I discussed the assessment and treatment plan with the patient. The patient was provided an opportunity to ask questions and all were answered. The patient agreed with the plan and demonstrated an understanding of the instructions.   The patient was advised to call back or seek an in-person evaluation if the symptoms worsen or if the condition fails to improve as anticipated.     Carolann Littler, MD

## 2019-05-22 ENCOUNTER — Other Ambulatory Visit: Payer: Self-pay | Admitting: Family Medicine

## 2019-06-05 ENCOUNTER — Ambulatory Visit (HOSPITAL_COMMUNITY)
Admission: RE | Admit: 2019-06-05 | Discharge: 2019-06-05 | Disposition: A | Discharge status: Home / Self Care | Payer: Managed Care, Other (non HMO) | Source: Ambulatory Visit | Attending: Neurological Surgery | Admitting: Neurological Surgery

## 2019-06-05 ENCOUNTER — Other Ambulatory Visit: Payer: Self-pay

## 2019-06-05 DIAGNOSIS — M5416 Radiculopathy, lumbar region: Secondary | ICD-10-CM

## 2019-06-05 DIAGNOSIS — M48061 Spinal stenosis, lumbar region without neurogenic claudication: Secondary | ICD-10-CM | POA: Diagnosis not present

## 2019-06-05 DIAGNOSIS — M5116 Intervertebral disc disorders with radiculopathy, lumbar region: Secondary | ICD-10-CM | POA: Diagnosis not present

## 2019-06-05 MED ORDER — OXYCODONE-ACETAMINOPHEN 5-325 MG PO TABS: 1.0000 | ORAL_TABLET | ORAL | Status: DC | PRN

## 2019-06-05 MED ORDER — ONDANSETRON HCL 4 MG/2ML IJ SOLN: 4.0000 mg | Freq: Four times a day (QID) | INTRAMUSCULAR | Status: DC | PRN

## 2019-06-05 MED ORDER — SODIUM CHLORIDE 0.9 % IV BOLUS
500.0000 mL | Freq: Once | INTRAVENOUS | Status: AC
  Administered 2019-06-05: 11:00:00 via INTRAVENOUS

## 2019-06-05 MED ORDER — OXYCODONE-ACETAMINOPHEN 5-325 MG PO TABS
ORAL_TABLET | ORAL | Status: AC
  Administered 2019-06-05: 1 via ORAL
  Filled 2019-06-05: qty 1

## 2019-06-05 MED ORDER — LIDOCAINE HCL (PF) 1 % IJ SOLN
5.0000 mL | Freq: Once | INTRAMUSCULAR | Status: AC
  Administered 2019-06-05: 5 mL via INTRADERMAL

## 2019-06-05 MED ORDER — IOHEXOL 300 MG/ML  SOLN
10.0000 mL | Freq: Once | INTRAMUSCULAR | Status: AC | PRN
  Administered 2019-06-05: 14 mL via INTRATHECAL

## 2019-06-05 MED ORDER — DIAZEPAM 5 MG PO TABS
10.0000 mg | ORAL_TABLET | Freq: Once | ORAL | Status: AC
  Administered 2019-06-05: 10 mg via ORAL
  Filled 2019-06-05: qty 2

## 2019-06-05 NOTE — Discharge Instructions (Signed)
Myelogram ° °A myelogram is an imaging study of the spinal cord and the places where nerves attach to the spinal cord (nerve roots). A dye (contrast material) is injected into the spine before the X-ray. This provides a clearer image for your health care provider to see. °You may need this study done if you have a spinal cord problem that cannot be diagnosed with other imaging studies, such as a CT scan or an MRI. You may also have this study to check your spine after surgery. °Tell a health care provider about: °· Any allergies you have, especially to iodine. °· All medicines you are taking, including vitamins, herbs, eye drops, creams, and over-the-counter medicines. °· Any problems you or family members have had with anesthetic medicines or contrast material. °· Any blood disorders you have. °· Any surgeries you have had. °· Any medical conditions you have or have had, including asthma. °· Whether you are pregnant or may be pregnant. °What are the risks? °Generally, this is a safe procedure. However, problems may occur, including: °· Infection. °· Bleeding. °· Allergic reaction to medicines or dyes. °· Damage to your spinal cord or nerves. °· Loss or leaking of spinal fluid. This can lead to headaches. °· Damage to kidneys. °· Seizures. This is rare. °What happens before the procedure? °· Follow instructions from your health care provider about eating or drinking restrictions. You may be asked to drink more fluids. °· Ask your health care provider about changing or stopping your regular medicines. This is especially important if you are taking diabetes medicines or blood thinners. °· Plan to have someone take you home from the hospital or clinic. °· If you will be going home right after the procedure, plan to have someone with you for 24 hours. °What happens during the procedure? °· You will lie face down on a table. °· Your health care provider will locate the best injection site on your spine. This is most  often in the lower back. °· The area of injection will be washed with soap. °· You will be given a medicine to numb the area (local anesthetic). °· Your health care provider will insert a long needle into the space around your spinal cord (subarachnoid space). °· A sample of spinal fluid may be taken and sent to the lab for testing. °· The contrast material will be injected into the subarachnoid space. °· The exam table may be tilted to help the contrast material flow up or down your spine. °· The X-ray will take images of your spinal cord for your health care provider to examine. °· A bandage (dressing) may be placed over the injection site. °The procedure may vary among health care providers and hospitals. °What can I expect after this procedure? °· Your blood pressure, heart rate, breathing rate, and blood oxygen level may be monitored until you leave the hospital or clinic. °· You may have: °? Soreness on your injection site. °? A mild headache. °· You will be asked to lie down with your head raised (elevated). This reduces the risk of a headache. °· It is up to you to get the results of your procedure. Ask your health care provider, or the department that is doing the procedure, when your results will be ready. °Follow these instructions at home: ° °· Rest as told by your health care provider. Lie flat with your head slightly elevated to reduce the risk of a headache. °· Do not bend, lift, or do hard work   for 24-48 hours, or as told by your health care provider. °· Take over-the-counter and prescription medicines only as told by your health care provider. °· Take care of your dressing as told by your health care provider. °· Drink enough fluid to keep your urine pale yellow. °· Bathe or shower as told by your health care provider. °Contact a health care provider if: °· You have a fever. °· You have a headache that lasts longer than 24 hours. °· You feel nauseous or vomit. °· You have a stiff neck or numbness in  your legs. °· You are unable to urinate or have a bowel movement. °· You develop a rash, itching, or sneezing. °Get help right away if: °· You have new symptoms or your symptoms get worse. °· You have a seizure. °· You have trouble breathing. °Summary °· A myelogram is an imaging study of the spinal cord and the places where nerves attach to the spinal cord (nerve roots). °· Before the procedure, follow instructions from your health care provider about changing or stopping your regular medicines, and eating and drinking restrictions. °· After this procedure, you will be asked to lie down with your head raised (elevated). This reduces your risk of a headache. °· Do not bend, lift, or do hard work for 24-48 hours, or as told by your health care provider. °· Contact a health care provider if you have a stiff neck or numbness in your legs. Get help right away if symptoms get worse, or you have a seizure or trouble breathing. °This information is not intended to replace advice given to you by your health care provider. Make sure you discuss any questions you have with your health care provider. °Document Released: 02/04/2004 Document Revised: 08/22/2018 Document Reviewed: 08/23/2018 °Elsevier Patient Education © 2020 [employer] ° °

## 2019-06-05 NOTE — Progress Notes (Signed)
Spoke with Hillary at Dr. Clarice Pole office about patient c/o severe headache . Patient states,  "head is pounding" pain is 10/10.  Bandaid to back is CDI, BP 94/58 .

## 2019-06-05 NOTE — Procedures (Signed)
[name] Sherman has had a decompression and fusion at L5 S1 about 6 months ago and is still having substantial pain in both lower extremities and in her back. She has some mild bulging of the discs at several levels but notes the pain is intolerable.  A myelogram is performed to obtain a dynamic study of her spine.  Pre op [id number] radiculopathy and back pain Post op [id number] Procedure:Lumbar myelopgram Surgeon:Erminie Foulks Puncture level:L2-3 Fluid color:clear colorless Injection:iohexol 200 14 cc Findings:mild bulging of all lumbar discs with retrolisthesis of L2-3 further eval with CT.

## 2019-06-05 NOTE — Progress Notes (Signed)
Spoke to Suzie from Dr. Clarice Pole office about patient still c/o headache 8/10, now has bilateral leg and foot cramps.  Patient was sat up slightly in bed and drinking coke.

## 2019-06-16 ENCOUNTER — Other Ambulatory Visit: Payer: Self-pay | Admitting: Family Medicine

## 2019-07-02 ENCOUNTER — Other Ambulatory Visit: Payer: Self-pay | Admitting: Family Medicine

## 2019-07-02 ENCOUNTER — Encounter: Payer: Self-pay | Admitting: Family Medicine

## 2019-07-03 MED ORDER — AMPHETAMINE-DEXTROAMPHETAMINE 30 MG PO TABS: ORAL_TABLET | ORAL | 0 refills | Status: DC

## 2019-07-11 ENCOUNTER — Telehealth: Payer: Self-pay

## 2019-07-11 ENCOUNTER — Other Ambulatory Visit: Payer: Self-pay

## 2019-07-11 MED ORDER — DULOXETINE HCL 60 MG PO CPEP: 60.0000 mg | ORAL_CAPSULE | Freq: Two times a day (BID) | ORAL | 0 refills | Status: DC

## 2019-07-11 NOTE — Telephone Encounter (Signed)
Sent order to patient's pharmacy for Cymbalta-60mg  BID for 1 month. Called patient and gave Dr. Doyne Keel recommendations to just do a trial for 1 month

## 2019-07-13 ENCOUNTER — Other Ambulatory Visit: Payer: Self-pay | Admitting: Family Medicine

## 2019-07-29 ENCOUNTER — Other Ambulatory Visit: Payer: Self-pay | Admitting: Family Medicine

## 2019-07-29 DIAGNOSIS — Z8616 Personal history of COVID-19: Secondary | ICD-10-CM

## 2019-07-29 DIAGNOSIS — U071 COVID-19: Secondary | ICD-10-CM

## 2019-07-29 HISTORY — DX: COVID-19: U07.1

## 2019-07-29 HISTORY — DX: Personal history of COVID-19: Z86.16

## 2019-08-02 ENCOUNTER — Telehealth (INDEPENDENT_AMBULATORY_CARE_PROVIDER_SITE_OTHER): Payer: Managed Care, Other (non HMO) | Admitting: Family Medicine

## 2019-08-02 ENCOUNTER — Other Ambulatory Visit: Payer: Self-pay

## 2019-08-02 VITALS — Wt 274.0 lb

## 2019-08-02 DIAGNOSIS — R233 Spontaneous ecchymoses: Secondary | ICD-10-CM | POA: Diagnosis not present

## 2019-08-02 DIAGNOSIS — R7303 Prediabetes: Secondary | ICD-10-CM | POA: Diagnosis not present

## 2019-08-02 MED ORDER — SAXENDA 18 MG/3ML ~~LOC~~ SOPN: 0.6000 mg | PEN_INJECTOR | Freq: Every day | SUBCUTANEOUS | 2 refills | Status: DC

## 2019-08-02 NOTE — Progress Notes (Signed)
This visit type was conducted due to national recommendations for restrictions regarding the COVID-19 pandemic in an effort to limit this patient's exposure and mitigate transmission in our community.   Virtual Visit via Video Note  I connected with [name] Sherman on 08/02/19 at  2:30 PM EST by a video enabled telemedicine application and verified that I am speaking with the correct person using two identifiers.  Location patient: home Location provider:work or home office Persons participating in the virtual visit: patient, provider  I discussed the limitations of evaluation and management by telemedicine and the availability of in person appointments. The patient expressed understanding and agreed to proceed.   HPI: [name] Sherman called with several issues to discuss as follows  She states she has had some recent easy bruising on her upper extremities.  One location was her forearm where her dog had scratched.  She had another one on the dorsum of her right hand but does not recall an injury.  She is not describing any generalized bruising or petechiae.  She has had occasional nosebleeds but not regularly.  No bleeding from the gums or any other worrisome bleeding.  No aspirin use.    Her major concern is that she has had some recent weight gain.  She tried multiple things in class including weight watchers, keto diet, noon, Truvy, Adipex, and Fen/Phen without improvement.  She specifically had questions regarding Saxenda.  She does have history of prediabetes range blood sugars and is concerned about her diabetes risk.   ROS: See pertinent positives and negatives per HPI.  Past Medical History:  Diagnosis Date  . ADD (attention deficit disorder)   . Anemia   . Anxiety   . Arthritis    shoulder, right (arthritis, tendonitis, bursitis)  . Cancer ([place])    cervical cancer (conization)   . Chronic kidney disease   . Depression   . GERD (gastroesophageal reflux disease)   . History of cervical  cancer    s/p  laser ablation of cervix 1980's  . History of kidney stones   . Hyperlipidemia   . Hypertension   . Hypothyroidism   . IBS (irritable bowel syndrome)   . Irregular heart rate    as a child, has never had any problems   . Kidney stones   . Left ureteral stone   . Migraine   . OSA (obstructive sleep apnea)    pt used cpap up until 2013 states lost wt and did not need anymore  . Restless legs   . Sleep apnea    wears CPAP  . Umbilical hernia without obstruction or gangrene   . Urgency of urination   . Vertigo   . Vitamin D deficiency     Past Surgical History:  Procedure Laterality Date  . ABDOMINAL HYSTERECTOMY    . BACK SURGERY  04/27/2018  . BUNIONECTOMY Right 2004  . COLONOSCOPY    . CYSTOSCOPY W/ RETROGRADES Bilateral 05/26/2015   Procedure: CYSTOSCOPY WITH RETROGRADE PYELOGRAM;  Surgeon: Festus Aloe, MD;  Location[facility];  Service: Urology;  Laterality: Bilateral;  . CYSTOSCOPY W/ URETERAL STENT REMOVAL Left 05/26/2015   Procedure: CYSTOSCOPY WITH STENT REMOVAL;  Surgeon: Festus Aloe, MD;  Location: [facility];  Service: Urology;  Laterality: Left;  . CYSTOSCOPY WITH RETROGRADE PYELOGRAM, URETEROSCOPY AND STENT PLACEMENT Left 05/01/2015   Procedure: CYSTOSCOPY WITH RETROGRADE PYELOGRAM AND STENT PLACEMENT;  Surgeon: Festus Aloe, MD;  Location: [facility];  Service: Urology;  Laterality: Left;  .  CYSTOSCOPY WITH STENT PLACEMENT Bilateral 05/26/2015   Procedure: CYSTOSCOPY WITH STENT PLACEMENT;  Surgeon: Festus Aloe, MD;  Location: [facility];  Service: Urology;  Laterality: Bilateral;  . CYSTOSCOPY WITH URETEROSCOPY Bilateral 05/26/2015   Procedure: CYSTOSCOPY WITH URETEROSCOPY;  Surgeon: Festus Aloe, MD;  Location: [facility];  Service: Urology;  Laterality: Bilateral;  . CYSTOSCOPY/URETEROSCOPY/HOLMIUM LASER/STENT PLACEMENT Right 06/09/2015   Procedure:  CYSTOSCOPY/URETEROSCOPY/STENT PLACEMENT REMOVAL LEFT URETERAL STENT;  Surgeon: Festus Aloe, MD;  Location: [facility];  Service: Urology;  Laterality: Right;  . HOLMIUM LASER APPLICATION Left [id number]   Procedure: HOLMIUM LASER APPLICATION;  Surgeon: Festus Aloe, MD;  Location: [facility];  Service: Urology;  Laterality: Left;  . HOLMIUM LASER APPLICATION Right 0000000   Procedure: HOLMIUM LASER APPLICATION;  Surgeon: Festus Aloe, MD;  Location: [facility];  Service: Urology;  Laterality: Right;  . INSERTION OF MESH N/A 09/05/2016   Procedure: INSERTION OF MESH;  Surgeon: [name], MD;  Location: [place];  Service: General;  Laterality: N/A;  . LAPAROSCOPIC ASSISTED VAGINAL HYSTERECTOMY  04-23-2002   and Left Salpingoophorectomy/  Anterior Repair/  Tension Free Tape Sling placement  . LASER ABLATION OF THE CERVIX  x2  1980's  . TUBAL LIGATION  1980's  . VENTRAL HERNIA REPAIR N/A 09/05/2016   Procedure: LAPAROSCOPIC VENTRAL HERNIA;  Surgeon: [name], MD;  Location: [place];  Service: General;  Laterality: N/A;    Family History  Problem Relation Age of Onset  . Alcohol abuse Mother   . Lung cancer Mother   . Colon cancer Mother   . Arthritis Father   . Hyperlipidemia Father   . Heart disease Father   . Hypertension Father   . Kidney disease Paternal Uncle   . Cancer Maternal Grandmother        colon  . Colon cancer Maternal Grandmother   . Rectal cancer Maternal Grandmother   . Cancer Paternal Grandmother        breast  . Esophageal cancer Neg Hx   . Stomach cancer Neg Hx     SOCIAL HX: Non-smoker   Current Outpatient Medications:  .  amphetamine-dextroamphetamine (ADDERALL) 30 MG tablet, Take one tablet twice daily., Disp: 60 tablet, Rfl: 0 .  atorvastatin (LIPITOR) 10 MG tablet, TAKE 1 TABLET BY MOUTH DAILY, Disp: 90 tablet, Rfl: 2 .  Biotin 1000 MCG tablet, Take 1,000 mcg by mouth 3 (three) times daily., Disp: , Rfl:  .  DULoxetine  (CYMBALTA) 60 MG capsule, Take 1 capsule (60 mg total) by mouth 2 (two) times daily., Disp: 60 capsule, Rfl: 0 .  furosemide (LASIX) 20 MG tablet, TAKE 1 TABLET(20 MG) BY MOUTH DAILY AS NEEDED FOR SWELLING, Disp: 30 tablet, Rfl: 1 .  gabapentin (NEURONTIN) 300 MG capsule, Take 1 capsule (300 mg total) by mouth 3 (three) times daily., Disp: 100 capsule, Rfl: 3 .  levothyroxine (SYNTHROID) 50 MCG tablet, Take 1 tablet (50 mcg total) by mouth daily. Schedule repeat labs for refills. [phone], Disp: 15 tablet, Rfl: 0 .  losartan-hydrochlorothiazide (HYZAAR) 50-12.5 MG tablet, Take 1 tablet by mouth daily., Disp: 90 tablet, Rfl: 3 .  ondansetron (ZOFRAN-ODT) 8 MG disintegrating tablet, Take 1 tablet (8 mg total) by mouth every 8 (eight) hours as needed for nausea or vomiting., Disp: 60 tablet, Rfl: 3 .  pantoprazole (PROTONIX) 40 MG tablet, Take 1 tablet (40 mg total) by mouth 2 (two) times daily., Disp: 60 tablet, Rfl: 2 .  pramipexole (MIRAPEX)  0.125 MG tablet, TAKE 1-2 TABLET EVERY EVENING AS NEEDED FOR RESTLESS LEG SYNDROME, Disp: 60 tablet, Rfl: 3 .  SUMAtriptan (IMITREX) 100 MG tablet, Take 1 tablet (100 mg total) by mouth every 2 (two) hours as needed for migraine. May repeat in 2 hours if headache persists or recurs., Disp: 10 tablet, Rfl: 11 .  Vitamin D, Ergocalciferol, (DRISDOL) 1.25 MG (50000 UT) CAPS capsule, TAKE 1 CAPSULE BY MOUTH 1 TIME A WEEK (Patient taking differently: Take 50,000 Units by mouth every Monday. ), Disp: 12 capsule, Rfl: 3  EXAM:  VITALS per patient if applicable:  GENERAL: alert, oriented, appears well and in no acute distress  HEENT: atraumatic, conjunttiva clear, no obvious abnormalities on inspection of external nose and ears  NECK: normal movements of the head and neck  LUNGS: on inspection no signs of respiratory distress, breathing rate appears normal, no obvious gross SOB, gasping or wheezing  CV: no obvious cyanosis  MS: moves all visible extremities  without noticeable abnormality  PSYCH/NEURO: pleasant and cooperative, no obvious depression or anxiety, speech and thought processing grossly intact  ASSESSMENT AND PLAN:  Discussed the following assessment and plan:  #1 obesity.  She has BMI over 40.  She tried multiple plans as above without success.  She specifically is interested in therapy with Saxenda.  We explained this would likely have to be prior authorized.  She does not have any contraindications such as pancreatitis history or gastroparesis.  -We will try Saxenda 0.6 mg daily for 1 week and then titrate to 1.2 mg.  Have recommended office follow-up within 1 month after starting  #2 spontaneous ecchymosis upper extremities.  No generalized ecchymoses.  Doubt platelet disorder or major clotting issue based on history  -We suggest that she follow-up in office for CBC and further evaluation if she has any progressive ecchymoses or other concerns  #3 prediabetes.   Needs to lose some weight and trying to get Saxenda approved, as above.       I discussed the assessment and treatment plan with the patient. The patient was provided an opportunity to ask questions and all were answered. The patient agreed with the plan and demonstrated an understanding of the instructions.   The patient was advised to call back or seek an in-person evaluation if the symptoms worsen or if the condition fails to improve as anticipated.     Carolann Littler, MD

## 2019-08-03 ENCOUNTER — Other Ambulatory Visit: Payer: Self-pay | Admitting: Family Medicine

## 2019-08-05 ENCOUNTER — Telehealth: Payer: Self-pay | Admitting: Family Medicine

## 2019-08-05 MED ORDER — AMPHETAMINE-DEXTROAMPHETAMINE 30 MG PO TABS: ORAL_TABLET | ORAL | 0 refills | Status: DC

## 2019-08-05 NOTE — Telephone Encounter (Signed)
Please see messages.

## 2019-08-05 NOTE — Telephone Encounter (Signed)
Pt is requesting a medication change from 60 to 30 but did not know the name of the medication, it possibly may be DULoxetine (CYMBALTA). Pt also wants to make sure the adderall was sent in for refill.

## 2019-08-05 NOTE — Telephone Encounter (Signed)
Pt is calling in stating that she is in need of the Rx Adderall and would like to see if it can be called in today due to her having a test and she really needs it.  Pt is aware that Dr. Elease Hashimoto is in with a pt and will get it as soon as he get out from seeing a pt.

## 2019-08-20 ENCOUNTER — Other Ambulatory Visit: Payer: Self-pay | Admitting: Family Medicine

## 2019-08-25 ENCOUNTER — Other Ambulatory Visit: Payer: Self-pay | Admitting: Family Medicine

## 2019-08-26 ENCOUNTER — Encounter: Payer: Self-pay | Admitting: Family Medicine

## 2019-08-26 NOTE — Telephone Encounter (Signed)
Refill for one year 

## 2019-08-27 ENCOUNTER — Other Ambulatory Visit: Payer: Self-pay

## 2019-08-27 ENCOUNTER — Other Ambulatory Visit: Payer: Self-pay | Admitting: Physician Assistant

## 2019-08-27 ENCOUNTER — Telehealth (INDEPENDENT_AMBULATORY_CARE_PROVIDER_SITE_OTHER): Payer: Managed Care, Other (non HMO) | Admitting: Family Medicine

## 2019-08-27 DIAGNOSIS — U071 COVID-19: Secondary | ICD-10-CM

## 2019-08-27 DIAGNOSIS — I10 Essential (primary) hypertension: Secondary | ICD-10-CM

## 2019-08-27 DIAGNOSIS — R05 Cough: Secondary | ICD-10-CM | POA: Diagnosis not present

## 2019-08-27 DIAGNOSIS — R059 Cough, unspecified: Secondary | ICD-10-CM

## 2019-08-27 MED ORDER — BENZONATATE 100 MG PO CAPS: 100.0000 mg | ORAL_CAPSULE | Freq: Three times a day (TID) | ORAL | 1 refills | Status: DC | PRN

## 2019-08-27 NOTE — Progress Notes (Signed)
  I connected by phone with [name] Sherman on 08/27/2019 at 3:35 PM to discuss the potential use of an new treatment for mild to moderate COVID-19 viral infection in non-hospitalized patients.  This patient is a 59 y.o. female that meets the FDA criteria for Emergency Use Authorization of bamlanivimab or casirivimab\imdevimab.  Has a (+) direct SARS-CoV-2 viral test result  Has mild or moderate COVID-19   Is ? 57 years of age and weighs ? 40 kg  Is NOT hospitalized due to COVID-19  Is NOT requiring oxygen therapy or requiring an increase in baseline oxygen flow rate due to COVID-19  Is within 10 days of symptom onset  Has at least one of the high risk factor(s) for progression to severe COVID-19 and/or hospitalization as defined in EUA.  Specific high risk criteria : BMI >/= 35  Hx of HTN  I have spoken and communicated the following to the patient or parent/caregiver:  1. FDA has authorized the emergency use of bamlanivimab and casirivimab\imdevimab for the treatment of mild to moderate COVID-19 in adults and pediatric patients with positive results of direct SARS-CoV-2 viral testing who are 37 years of age and older weighing at least 40 kg, and who are at high risk for progressing to severe COVID-19 and/or hospitalization.  2. The significant known and potential risks and benefits of bamlanivimab and casirivimab\imdevimab, and the extent to which such potential risks and benefits are unknown.  3. Information on available alternative treatments and the risks and benefits of those alternatives, including clinical trials.  4. Patients treated with bamlanivimab and casirivimab\imdevimab should continue to self-isolate and use infection control measures (e.g., wear mask, isolate, social distance, avoid sharing personal items, clean and disinfect "high touch" surfaces, and frequent handwashing) according to CDC guidelines.   5. The patient or parent/caregiver has the option to accept or  refuse bamlanivimab or casirivimab\imdevimab .  After reviewing this information with the patient, The patient agreed to proceed with receiving the bamlanimivab infusion and will be provided a copy of the Fact sheet prior to receiving the infusion.   [name] 08/27/2019 3:35 PM

## 2019-08-27 NOTE — Progress Notes (Signed)
This visit type was conducted due to national recommendations for restrictions regarding the COVID-19 pandemic in an effort to limit this patient's exposure and mitigate transmission in our community.   Virtual Visit via Video Note  I connected with [name] Sherman on 08/27/19 at  2:30 PM EST by a video enabled telemedicine application and verified that I am speaking with the correct person using two identifiers.  Location patient: home Location provider:work or home office Persons participating in the virtual visit: patient, provider  I discussed the limitations of evaluation and management by telemedicine and the availability of in person appointments. The patient expressed understanding and agreed to proceed.   HPI: [name] Sherman called with onset of symptoms last week of cough, congestion, and headache.  She had particularly noted worsening last weekend of symptoms.  She went Saturday for Covid testing and results came back yesterday positive.  She has now lost taste and smell.  She had some mild diarrhea initially.  No dyspnea at rest but does have some with activity.  She has had fever up as high as 101.6.  Diffuse myalgias.  No nausea or vomiting.  Husband also has similar symptoms though less severe.  [name] is a non-smoker.  She has comorbidities including hypertension history and morbid obesity with BMI over 40.  She also has history of obstructive sleep apnea.  Her cough has been her most bothersome symptom along with the myalgias and extreme fatigue.   ROS: See pertinent positives and negatives per HPI.  Past Medical History:  Diagnosis Date  . ADD (attention deficit disorder)   . Anemia   . Anxiety   . Arthritis    shoulder, right (arthritis, tendonitis, bursitis)  . Cancer ([place])    cervical cancer (conization)   . Chronic kidney disease   . Depression   . GERD (gastroesophageal reflux disease)   . History of cervical cancer    s/p  laser ablation of cervix 1980's  . History of  kidney stones   . Hyperlipidemia   . Hypertension   . Hypothyroidism   . IBS (irritable bowel syndrome)   . Irregular heart rate    as a child, has never had any problems   . Kidney stones   . Left ureteral stone   . Migraine   . OSA (obstructive sleep apnea)    pt used cpap up until 2013 states lost wt and did not need anymore  . Restless legs   . Sleep apnea    wears CPAP  . Umbilical hernia without obstruction or gangrene   . Urgency of urination   . Vertigo   . Vitamin D deficiency     Past Surgical History:  Procedure Laterality Date  . ABDOMINAL HYSTERECTOMY    . BACK SURGERY  04/27/2018  . BUNIONECTOMY Right 2004  . COLONOSCOPY    . CYSTOSCOPY W/ RETROGRADES Bilateral 05/26/2015   Procedure: CYSTOSCOPY WITH RETROGRADE PYELOGRAM;  Surgeon: Festus Aloe, MD;  Location: [facility];  Service: Urology;  Laterality: Bilateral;  . CYSTOSCOPY W/ URETERAL STENT REMOVAL Left 05/26/2015   Procedure: CYSTOSCOPY WITH STENT REMOVAL;  Surgeon: Festus Aloe, MD;  [facility];  Service: Urology;  Laterality: Left;  . CYSTOSCOPY WITH RETROGRADE PYELOGRAM, URETEROSCOPY AND STENT PLACEMENT Left 05/01/2015   Procedure: CYSTOSCOPY WITH RETROGRADE PYELOGRAM AND STENT PLACEMENT;  Surgeon: Festus Aloe, MD;  [facility];  Service: Urology;  Laterality: Left;  . CYSTOSCOPY WITH STENT PLACEMENT Bilateral 05/26/2015   Procedure: CYSTOSCOPY  WITH STENT PLACEMENT;  Surgeon: Festus Aloe, MD;  Location: [facility];  Service: Urology;  Laterality: Bilateral;  . CYSTOSCOPY WITH URETEROSCOPY Bilateral 05/26/2015   Procedure: CYSTOSCOPY WITH URETEROSCOPY;  Surgeon: Festus Aloe, MD;  Location: [facility];  Service: Urology;  Laterality: Bilateral;  . CYSTOSCOPY/URETEROSCOPY/HOLMIUM LASER/STENT PLACEMENT Right 06/09/2015   Procedure: CYSTOSCOPY/URETEROSCOPY/STENT PLACEMENT REMOVAL LEFT URETERAL STENT;  Surgeon:  Festus Aloe, MD;  Location: [facility];  Service: Urology;  Laterality: Right;  . HOLMIUM LASER APPLICATION Left [id number]   Procedure: HOLMIUM LASER APPLICATION;  Surgeon: Festus Aloe, MD;  Location[facility];  Service: Urology;  Laterality: Left;  . HOLMIUM LASER APPLICATION Right 0000000   Procedure: HOLMIUM LASER APPLICATION;  Surgeon: Festus Aloe, MD;  Location: [facility];  Service: Urology;  Laterality: Right;  . INSERTION OF MESH N/A 09/05/2016   Procedure: INSERTION OF MESH;  Surgeon: [name], MD;  Location: [place];  Service: General;  Laterality: N/A;  . LAPAROSCOPIC ASSISTED VAGINAL HYSTERECTOMY  04-23-2002   and Left Salpingoophorectomy/  Anterior Repair/  Tension Free Tape Sling placement  . LASER ABLATION OF THE CERVIX  x2  1980's  . TUBAL LIGATION  1980's  . VENTRAL HERNIA REPAIR N/A 09/05/2016   Procedure: LAPAROSCOPIC VENTRAL HERNIA;  Surgeon: [name], MD;  Location: [place];  Service: General;  Laterality: N/A;    Family History  Problem Relation Age of Onset  . Alcohol abuse Mother   . Lung cancer Mother   . Colon cancer Mother   . Arthritis Father   . Hyperlipidemia Father   . Heart disease Father   . Hypertension Father   . Kidney disease Paternal Uncle   . Cancer Maternal Grandmother        colon  . Colon cancer Maternal Grandmother   . Rectal cancer Maternal Grandmother   . Cancer Paternal Grandmother        breast  . Esophageal cancer Neg Hx   . Stomach cancer Neg Hx     SOCIAL HX: Non-smoker   Current Outpatient Medications:  .  amphetamine-dextroamphetamine (ADDERALL) 30 MG tablet, Take one tablet twice daily., Disp: 60 tablet, Rfl: 0 .  atorvastatin (LIPITOR) 10 MG tablet, TAKE 1 TABLET BY MOUTH DAILY, Disp: 90 tablet, Rfl: 2 .  Biotin 1000 MCG tablet, Take 1,000 mcg by mouth 3 (three) times daily., Disp: , Rfl:  .  DULoxetine (CYMBALTA) 30 MG capsule, , Disp: , Rfl:  .  furosemide (LASIX) 20 MG tablet,  TAKE 1 TABLET(20 MG) BY MOUTH DAILY AS NEEDED FOR SWELLING, Disp: 30 tablet, Rfl: 1 .  gabapentin (NEURONTIN) 300 MG capsule, Take 1 capsule (300 mg total) by mouth 3 (three) times daily., Disp: 100 capsule, Rfl: 3 .  levothyroxine (SYNTHROID) 50 MCG tablet, Take 1 tablet (50 mcg total) by mouth daily. Schedule repeat labs for refills. [phone], Disp: 15 tablet, Rfl: 0 .  Liraglutide -Weight Management (SAXENDA) 18 MG/3ML SOPN, Inject 0.6 mg into the skin daily. Inject 0.6 mg sub-cutaneous into the skin once daily for 1 week and then increase to 1.2 mg subcutaneous once daily, Disp: 6 mL, Rfl: 2 .  losartan-hydrochlorothiazide (HYZAAR) 50-12.5 MG tablet, Take 1 tablet by mouth daily., Disp: 90 tablet, Rfl: 3 .  ondansetron (ZOFRAN-ODT) 8 MG disintegrating tablet, Take 1 tablet (8 mg total) by mouth every 8 (eight) hours as needed for nausea or vomiting., Disp: 60 tablet, Rfl: 3 .  oxyCODONE-acetaminophen (PERCOCET/ROXICET) 5-325 MG tablet, Take  1 tablet by mouth every 8 (eight) hours as needed., Disp: , Rfl:  .  pantoprazole (PROTONIX) 40 MG tablet, Take 1 tablet (40 mg total) by mouth 2 (two) times daily., Disp: 60 tablet, Rfl: 2 .  pramipexole (MIRAPEX) 0.125 MG tablet, TAKE 1-2 TABLET EVERY EVENING AS NEEDED FOR RESTLESS LEG SYNDROME, Disp: 60 tablet, Rfl: 3 .  SUMAtriptan (IMITREX) 100 MG tablet, Take 1 tablet (100 mg total) by mouth every 2 (two) hours as needed for migraine. May repeat in 2 hours if headache persists or recurs., Disp: 10 tablet, Rfl: 11 .  Vitamin D, Ergocalciferol, (DRISDOL) 1.25 MG (50000 UNIT) CAPS capsule, TAKE 1 CAPSULE BY MOUTH 1 TIME A WEEK, Disp: 12 capsule, Rfl: 3 .  benzonatate (TESSALON PERLES) 100 MG capsule, Take 1 capsule (100 mg total) by mouth 3 (three) times daily as needed for cough., Disp: 40 capsule, Rfl: 1  EXAM:  VITALS per patient if applicable:  GENERAL: alert, oriented, appears well and in no acute distress  HEENT: atraumatic, conjunttiva clear,  no obvious abnormalities on inspection of external nose and ears  NECK: normal movements of the head and neck  LUNGS: on inspection no signs of respiratory distress, breathing rate appears normal, no obvious gross SOB, gasping or wheezing  CV: no obvious cyanosis  MS: moves all visible extremities without noticeable abnormality  PSYCH/NEURO: pleasant and cooperative, no obvious depression or anxiety, speech and thought processing grossly intact  ASSESSMENT AND PLAN:  Discussed the following assessment and plan:  COVID-19 infection.  Patient is within 10 days of onset of symptoms.  She has increased risk because of her morbid obesity, age, hypertension history.  -Continue quarantine for minimum of 10 days from onset of symptoms -Plenty of fluids and rest -We discussed possible referral for monoclonal antibody infusion if she is deemed appropriate candidate and she was interested in looking at that.  Referral was placed. -Continue to monitor oxygen levels closely and she knows to go to the ER if oxygen levels are dropping -Send in [place] Perles 100 mg every 8 hours as needed for cough    I discussed the assessment and treatment plan with the patient. The patient was provided an opportunity to ask questions and all were answered. The patient agreed with the plan and demonstrated an understanding of the instructions.   The patient was advised to call back or seek an in-person evaluation if the symptoms worsen or if the condition fails to improve as anticipated.     Carolann Littler, MD

## 2019-08-28 ENCOUNTER — Ambulatory Visit (HOSPITAL_COMMUNITY)
Admission: RE | Admit: 2019-08-28 | Discharge: 2019-08-28 | Disposition: A | Discharge status: Home / Self Care | Payer: Managed Care, Other (non HMO) | Source: Ambulatory Visit | Attending: Pulmonary Disease | Admitting: Pulmonary Disease

## 2019-08-28 ENCOUNTER — Encounter (HOSPITAL_COMMUNITY): Payer: Self-pay

## 2019-08-28 DIAGNOSIS — U071 COVID-19: Secondary | ICD-10-CM | POA: Insufficient documentation

## 2019-08-28 MED ORDER — SODIUM CHLORIDE 0.9 % IV SOLN
700.0000 mg | Freq: Once | INTRAVENOUS | Status: AC
  Administered 2019-08-28: 700 mg via INTRAVENOUS
  Filled 2019-08-28: qty 20

## 2019-08-28 MED ORDER — SODIUM CHLORIDE 0.9 % IV SOLN: INTRAVENOUS | Status: DC | PRN

## 2019-08-28 MED ORDER — ALBUTEROL SULFATE HFA 108 (90 BASE) MCG/ACT IN AERS: 2.0000 | INHALATION_SPRAY | Freq: Once | RESPIRATORY_TRACT | Status: DC | PRN

## 2019-08-28 MED ORDER — FAMOTIDINE IN NACL 20-0.9 MG/50ML-% IV SOLN: 20.0000 mg | Freq: Once | INTRAVENOUS | Status: DC | PRN

## 2019-08-28 MED ORDER — EPINEPHRINE 0.3 MG/0.3ML IJ SOAJ: 0.3000 mg | Freq: Once | INTRAMUSCULAR | Status: DC | PRN

## 2019-08-28 MED ORDER — DIPHENHYDRAMINE HCL 50 MG/ML IJ SOLN: 50.0000 mg | Freq: Once | INTRAMUSCULAR | Status: DC | PRN

## 2019-08-28 MED ORDER — METHYLPREDNISOLONE SODIUM SUCC 125 MG IJ SOLR: 125.0000 mg | Freq: Once | INTRAMUSCULAR | Status: DC | PRN

## 2019-08-28 NOTE — Progress Notes (Signed)
  Diagnosis: COVID-19  Physician: Dr. Joya Gaskins  Procedure: [facility] Med: bamlanivimab infusion - Provided patient with bamlanimivab fact sheet for patients, parents and caregivers prior to infusion.  Complications: No immediate complications noted.  Discharge: Discharged home   [name] Sherman, [name] Sherman 08/28/2019

## 2019-08-28 NOTE — Discharge Instructions (Signed)

## 2019-08-29 ENCOUNTER — Telehealth: Payer: Self-pay | Admitting: Family Medicine

## 2019-08-29 MED ORDER — AMPHETAMINE-DEXTROAMPHETAMINE 30 MG PO TABS: ORAL_TABLET | ORAL | 0 refills | Status: DC

## 2019-08-29 NOTE — Telephone Encounter (Signed)
Message Routed to PCP

## 2019-08-29 NOTE — Telephone Encounter (Signed)
Pt is requesting a refill on Adderall 30 mg sent to [facility] on [address]. Thanks

## 2019-09-10 ENCOUNTER — Other Ambulatory Visit: Payer: Self-pay

## 2019-09-10 ENCOUNTER — Telehealth (INDEPENDENT_AMBULATORY_CARE_PROVIDER_SITE_OTHER): Payer: Managed Care, Other (non HMO) | Admitting: Family Medicine

## 2019-09-10 DIAGNOSIS — R21 Rash and other nonspecific skin eruption: Secondary | ICD-10-CM | POA: Diagnosis not present

## 2019-09-10 NOTE — Progress Notes (Signed)
This visit type was conducted due to national recommendations for restrictions regarding the COVID-19 pandemic in an effort to limit this patient's exposure and mitigate transmission in our community.   Virtual Visit via Video Note  I connected with [name] Sherman on 09/10/19 at  4:30 PM EDT by a video enabled telemedicine application and verified that I am speaking with the correct person using two identifiers.  Location patient: home Location provider:work or home office Persons participating in the virtual visit: patient, provider  I discussed the limitations of evaluation and management by telemedicine and the availability of in person appointments. The patient expressed understanding and agreed to proceed.   HPI: [name] Sherman had Covid infection a few weeks ago.  She ended up with monoclonal antibody infusion.  She feels that she is overall gradually improving but she woke up this morning with rash right side of face predominantly underneath the right.  This is slightly burning and painful.  Slightly raised.  No hx of herpetic peri-ocular infection.  She has not used any make-up in a few weeks.  She does states she might have some mild blurred vision.  No diplopia.  No fevers or chills.  Denies any scalp rash or any other facial rash.  No systemic rash.   ROS: See pertinent positives and negatives per HPI.  Past Medical History:  Diagnosis Date  . ADD (attention deficit disorder)   . Anemia   . Anxiety   . Arthritis    shoulder, right (arthritis, tendonitis, bursitis)  . Cancer ([place])    cervical cancer (conization)   . Chronic kidney disease   . Depression   . GERD (gastroesophageal reflux disease)   . History of cervical cancer    s/p  laser ablation of cervix 1980's  . History of kidney stones   . Hyperlipidemia   . Hypertension   . Hypothyroidism   . IBS (irritable bowel syndrome)   . Irregular heart rate    as a child, has never had any problems   . Kidney stones   . Left  ureteral stone   . Migraine   . OSA (obstructive sleep apnea)    pt used cpap up until 2013 states lost wt and did not need anymore  . Restless legs   . Sleep apnea    wears CPAP  . Umbilical hernia without obstruction or gangrene   . Urgency of urination   . Vertigo   . Vitamin D deficiency     Past Surgical History:  Procedure Laterality Date  . ABDOMINAL HYSTERECTOMY    . BACK SURGERY  04/27/2018  . BUNIONECTOMY Right 2004  . COLONOSCOPY    . CYSTOSCOPY W/ RETROGRADES Bilateral 05/26/2015   Procedure: CYSTOSCOPY WITH RETROGRADE PYELOGRAM;  Surgeon: Festus Aloe, MD;  Location: [facility];  Service: Urology;  Laterality: Bilateral;  . CYSTOSCOPY W/ URETERAL STENT REMOVAL Left 05/26/2015   Procedure: CYSTOSCOPY WITH STENT REMOVAL;  Surgeon: Festus Aloe, MD;  Location: [facility];  Service: Urology;  Laterality: Left;  . CYSTOSCOPY WITH RETROGRADE PYELOGRAM, URETEROSCOPY AND STENT PLACEMENT Left 05/01/2015   Procedure: CYSTOSCOPY WITH RETROGRADE PYELOGRAM AND STENT PLACEMENT;  Surgeon: Festus Aloe, MD;  [facility];  Service: Urology;  Laterality: Left;  . CYSTOSCOPY WITH STENT PLACEMENT Bilateral 05/26/2015   Procedure: CYSTOSCOPY WITH STENT PLACEMENT;  Surgeon: Festus Aloe, MD;  Location: [facility];  Service: Urology;  Laterality: Bilateral;  . CYSTOSCOPY WITH URETEROSCOPY Bilateral 05/26/2015   Procedure: CYSTOSCOPY  WITH URETEROSCOPY;  Surgeon: Festus Aloe, MD;  Location: [facility];  Service: Urology;  Laterality: Bilateral;  . CYSTOSCOPY/URETEROSCOPY/HOLMIUM LASER/STENT PLACEMENT Right 06/09/2015   Procedure: CYSTOSCOPY/URETEROSCOPY/STENT PLACEMENT REMOVAL LEFT URETERAL STENT;  Surgeon: Festus Aloe, MD;  [facility];  Service: Urology;  Laterality: Right;  . HOLMIUM LASER APPLICATION Left [id number]   Procedure: HOLMIUM LASER APPLICATION;  Surgeon: Festus Aloe, MD;   Location[facility];  Service: Urology;  Laterality: Left;  . HOLMIUM LASER APPLICATION Right [id number]   Procedure: HOLMIUM LASER APPLICATION;  Surgeon: Festus Aloe, MD;  [facility];  Service: Urology;  Laterality: Right;  . INSERTION OF MESH N/A 09/05/2016   Procedure: INSERTION OF MESH;  Surgeon: [name], MD;  [facility];  Service: General;  Laterality: N/A;  . LAPAROSCOPIC ASSISTED VAGINAL HYSTERECTOMY  04-23-2002   and Left Salpingoophorectomy/  Anterior Repair/  Tension Free Tape Sling placement  . LASER ABLATION OF THE CERVIX  x2  1980's  . TUBAL LIGATION  1980's  . VENTRAL HERNIA REPAIR N/A 09/05/2016   Procedure: LAPAROSCOPIC VENTRAL HERNIA;  Surgeon: [name], MD;  Location: [place];  Service: General;  Laterality: N/A;    Family History  Problem Relation Age of Onset  . Alcohol abuse Mother   . Lung cancer Mother   . Colon cancer Mother   . Arthritis Father   . Hyperlipidemia Father   . Heart disease Father   . Hypertension Father   . Kidney disease Paternal Uncle   . Cancer Maternal Grandmother        colon  . Colon cancer Maternal Grandmother   . Rectal cancer Maternal Grandmother   . Cancer Paternal Grandmother        breast  . Esophageal cancer Neg Hx   . Stomach cancer Neg Hx     SOCIAL HX: Non-smoker   Current Outpatient Medications:  .  amphetamine-dextroamphetamine (ADDERALL) 30 MG tablet, Take one tablet twice daily., Disp: 60 tablet, Rfl: 0 .  atorvastatin (LIPITOR) 10 MG tablet, TAKE 1 TABLET BY MOUTH DAILY, Disp: 90 tablet, Rfl: 2 .  benzonatate (TESSALON PERLES) 100 MG capsule, Take 1 capsule (100 mg total) by mouth 3 (three) times daily as needed for cough., Disp: 40 capsule, Rfl: 1 .  Biotin 1000 MCG tablet, Take 1,000 mcg by mouth 3 (three) times daily., Disp: , Rfl:  .  DULoxetine (CYMBALTA) 30 MG capsule, , Disp: , Rfl:  .  furosemide (LASIX) 20 MG tablet, TAKE 1 TABLET(20 MG) BY MOUTH DAILY AS  NEEDED FOR SWELLING, Disp: 30 tablet, Rfl: 1 .  gabapentin (NEURONTIN) 300 MG capsule, Take 1 capsule (300 mg total) by mouth 3 (three) times daily., Disp: 100 capsule, Rfl: 3 .  levothyroxine (SYNTHROID) 50 MCG tablet, Take 1 tablet (50 mcg total) by mouth daily. Schedule repeat labs for refills. [phone], Disp: 15 tablet, Rfl: 0 .  Liraglutide -Weight Management (SAXENDA) 18 MG/3ML SOPN, Inject 0.6 mg into the skin daily. Inject 0.6 mg sub-cutaneous into the skin once daily for 1 week and then increase to 1.2 mg subcutaneous once daily, Disp: 6 mL, Rfl: 2 .  losartan-hydrochlorothiazide (HYZAAR) 50-12.5 MG tablet, Take 1 tablet by mouth daily., Disp: 90 tablet, Rfl: 3 .  ondansetron (ZOFRAN-ODT) 8 MG disintegrating tablet, Take 1 tablet (8 mg total) by mouth every 8 (eight) hours as needed for nausea or vomiting., Disp: 60 tablet, Rfl: 3 .  oxyCODONE-acetaminophen (PERCOCET/ROXICET) 5-325 MG tablet, Take 1 tablet  by mouth every 8 (eight) hours as needed., Disp: , Rfl:  .  pantoprazole (PROTONIX) 40 MG tablet, Take 1 tablet (40 mg total) by mouth 2 (two) times daily., Disp: 60 tablet, Rfl: 2 .  pramipexole (MIRAPEX) 0.125 MG tablet, TAKE 1-2 TABLET EVERY EVENING AS NEEDED FOR RESTLESS LEG SYNDROME, Disp: 60 tablet, Rfl: 3 .  SUMAtriptan (IMITREX) 100 MG tablet, Take 1 tablet (100 mg total) by mouth every 2 (two) hours as needed for migraine. May repeat in 2 hours if headache persists or recurs., Disp: 10 tablet, Rfl: 11 .  Vitamin D, Ergocalciferol, (DRISDOL) 1.25 MG (50000 UNIT) CAPS capsule, TAKE 1 CAPSULE BY MOUTH 1 TIME A WEEK, Disp: 12 capsule, Rfl: 3  EXAM:  VITALS per patient if applicable:  GENERAL: alert, oriented, appears well and in no acute distress  HEENT: atraumatic, conjunttiva clear, no obvious abnormalities on inspection of external nose and ears  NECK: normal movements of the head and neck  LUNGS: on inspection no signs of respiratory distress, breathing rate appears  normal, no obvious gross SOB, gasping or wheezing  CV: no obvious cyanosis  MS: moves all visible extremities without noticeable abnormality  PSYCH/NEURO: pleasant and cooperative, no obvious depression or anxiety, speech and thought processing grossly intact  ASSESSMENT AND PLAN:  Discussed the following assessment and plan:  Patient describes right periocular rash.  This does not look like cellulitis on the video but we had some difficulty making out details.  Given the fact that she is complaining of some blurred vision we have recommended prompt ophthalmologic evaluation.  Doubt zoster ophthalmicus given distribution of rash which is just below the eye.  She does not have any nasal or frontal scalp involvement.    -Patient will call and set up ophthalmology referral immediately -she is instructed to let me know tomorrow if she is unable to get in to see ophthalmology.  -We recommended avoiding any prescription creams until this is further evaluated     I discussed the assessment and treatment plan with the patient. The patient was provided an opportunity to ask questions and all were answered. The patient agreed with the plan and demonstrated an understanding of the instructions.   The patient was advised to call back or seek an in-person evaluation if the symptoms worsen or if the condition fails to improve as anticipated.     Carolann Littler, MD

## 2019-09-20 ENCOUNTER — Other Ambulatory Visit: Payer: Self-pay | Admitting: Family Medicine

## 2019-09-21 MED ORDER — LEVOTHYROXINE SODIUM 50 MCG PO TABS: 50.0000 ug | ORAL_TABLET | Freq: Every day | ORAL | 0 refills | Status: DC

## 2019-09-26 ENCOUNTER — Telehealth: Payer: Self-pay | Admitting: Family Medicine

## 2019-09-26 NOTE — Telephone Encounter (Signed)
Medication: Adderall  Pharmacy: [facility]

## 2019-09-27 ENCOUNTER — Encounter: Payer: Self-pay | Admitting: Family Medicine

## 2019-09-27 ENCOUNTER — Other Ambulatory Visit: Payer: Self-pay | Admitting: Family Medicine

## 2019-09-29 MED ORDER — AMPHETAMINE-DEXTROAMPHETAMINE 30 MG PO TABS: ORAL_TABLET | ORAL | 0 refills | Status: DC

## 2019-09-30 ENCOUNTER — Other Ambulatory Visit: Payer: Self-pay | Admitting: Family Medicine

## 2019-10-09 ENCOUNTER — Encounter: Payer: Self-pay | Admitting: Family Medicine

## 2019-10-09 DIAGNOSIS — G43911 Migraine, unspecified, intractable, with status migrainosus: Secondary | ICD-10-CM

## 2019-10-12 ENCOUNTER — Other Ambulatory Visit: Payer: Self-pay | Admitting: Family Medicine

## 2019-10-15 ENCOUNTER — Other Ambulatory Visit: Payer: Self-pay | Admitting: Family Medicine

## 2019-10-15 ENCOUNTER — Other Ambulatory Visit: Payer: Self-pay

## 2019-10-15 MED ORDER — PANTOPRAZOLE SODIUM 40 MG PO TBEC: 40.0000 mg | DELAYED_RELEASE_TABLET | Freq: Two times a day (BID) | ORAL | 3 refills | Status: DC

## 2019-10-15 NOTE — Progress Notes (Signed)
Refill request via fax for Protonix BID. Rx sent to [facility]

## 2019-10-16 ENCOUNTER — Encounter: Payer: Self-pay | Admitting: Family Medicine

## 2019-10-29 ENCOUNTER — Encounter: Payer: Self-pay | Admitting: Family Medicine

## 2019-10-29 NOTE — Telephone Encounter (Signed)
Hi Deb,  I see the referral was placed. What do we need to do now? Please advise

## 2019-11-09 ENCOUNTER — Other Ambulatory Visit: Payer: Self-pay | Admitting: Family Medicine

## 2019-11-11 ENCOUNTER — Encounter: Payer: Self-pay | Admitting: Family Medicine

## 2019-11-14 ENCOUNTER — Other Ambulatory Visit: Payer: Self-pay | Admitting: Family Medicine

## 2019-12-02 ENCOUNTER — Ambulatory Visit (INDEPENDENT_AMBULATORY_CARE_PROVIDER_SITE_OTHER): Payer: Managed Care, Other (non HMO) | Admitting: Family Medicine

## 2019-12-02 ENCOUNTER — Encounter: Payer: Self-pay | Admitting: Family Medicine

## 2019-12-02 ENCOUNTER — Other Ambulatory Visit: Payer: Self-pay

## 2019-12-02 VITALS — BP 136/78 | HR 105 | Temp 98.2°F | Wt 258.4 lb

## 2019-12-02 DIAGNOSIS — R631 Polydipsia: Secondary | ICD-10-CM | POA: Diagnosis not present

## 2019-12-02 DIAGNOSIS — H04123 Dry eye syndrome of bilateral lacrimal glands: Secondary | ICD-10-CM

## 2019-12-02 DIAGNOSIS — R233 Spontaneous ecchymoses: Secondary | ICD-10-CM | POA: Diagnosis not present

## 2019-12-02 DIAGNOSIS — R7303 Prediabetes: Secondary | ICD-10-CM | POA: Diagnosis not present

## 2019-12-02 LAB — POCT GLYCOSYLATED HEMOGLOBIN (HGB A1C): Hemoglobin A1C: 6.7 % — AB (ref 4.0–5.6)

## 2019-12-02 LAB — GLUCOSE, POCT (MANUAL RESULT ENTRY): POC Glucose: 137 mg/dl — AB (ref 70–99)

## 2019-12-02 MED ORDER — AMPHETAMINE-DEXTROAMPHETAMINE 30 MG PO TABS: ORAL_TABLET | ORAL | 0 refills | Status: DC

## 2019-12-02 MED ORDER — OZEMPIC (0.25 OR 0.5 MG/DOSE) 2 MG/1.5ML ~~LOC~~ SOPN: 0.2500 mg | PEN_INJECTOR | SUBCUTANEOUS | 3 refills | Status: DC

## 2019-12-02 MED ORDER — SUMATRIPTAN SUCCINATE 100 MG PO TABS: ORAL_TABLET | ORAL | 11 refills | Status: DC

## 2019-12-02 MED ORDER — CITALOPRAM HYDROBROMIDE 20 MG PO TABS: 20.0000 mg | ORAL_TABLET | Freq: Every day | ORAL | 5 refills | Status: DC

## 2019-12-02 NOTE — Progress Notes (Signed)
Subjective:     Patient ID: [name] Sherman, female   DOB: 1961-06-04, 59 y.o.   MRN: [id number]  HPI Kasidi seen with chief complaint today of "dry mouth ".  She had Covid infection back in February and was very sick and she states she had increased thirst since then.  She has not noted increased urine frequency.  She has had frequent dry symptoms as well.  She has had some recent modest weight loss and by her scales here is lost about 8 pounds since last visit.  She is trying to lose some weight.  She has had elevated glucose readings in the past but no formal diagnosis of type 2 diabetes.  She does take Adderall chronically but has not noted particularly severe dry mouth in the past with that.  She is currently not taking any regular anticholinergic medications.  She for some time has been on Cymbalta for chronic radiculopathy type pains but she feels that she was doing much better on Celexa.  She would like to discontinue Cymbalta altogether.  She is currently taper down to 30 mg daily.  She had requested Saxenda to help with weight loss but could not get insurance coverage.  She relates another issue of easy bruising recently.  Particular on her forearms.  She is noted bruising in the absence of known trauma.  No other signs of bleeding such as hematuria, hematochezia, or bleeding from the gums.  Wt Readings from Last 3 Encounters:  12/02/19 258 lb 6.4 oz (117.2 kg)  08/02/19 274 lb (124.3 kg)  06/05/19 260 lb (117.9 kg)     Past Medical History:  Diagnosis Date  . ADD (attention deficit disorder)   . Anemia   . Anxiety   . Arthritis    shoulder, right (arthritis, tendonitis, bursitis)  . Cancer ([place])    cervical cancer (conization)   . Chronic kidney disease   . Depression   . GERD (gastroesophageal reflux disease)   . History of cervical cancer    s/p  laser ablation of cervix 1980's  . History of kidney stones   . Hyperlipidemia   . Hypertension   . Hypothyroidism   .  IBS (irritable bowel syndrome)   . Irregular heart rate    as a child, has never had any problems   . Kidney stones   . Left ureteral stone   . Migraine   . OSA (obstructive sleep apnea)    pt used cpap up until 2013 states lost wt and did not need anymore  . Restless legs   . Sleep apnea    wears CPAP  . Umbilical hernia without obstruction or gangrene   . Urgency of urination   . Vertigo   . Vitamin D deficiency    Past Surgical History:  Procedure Laterality Date  . ABDOMINAL HYSTERECTOMY    . BACK SURGERY  04/27/2018  . BUNIONECTOMY Right 2004  . COLONOSCOPY    . CYSTOSCOPY W/ RETROGRADES Bilateral 05/26/2015   Procedure: CYSTOSCOPY WITH RETROGRADE PYELOGRAM;  Surgeon: Festus Aloe, MD;  Location: [facility];  Service: Urology;  Laterality: Bilateral;  . CYSTOSCOPY W/ URETERAL STENT REMOVAL Left 05/26/2015   Procedure: CYSTOSCOPY WITH STENT REMOVAL;  Surgeon: Festus Aloe, MD;  Location: [facility];  Service: Urology;  Laterality: Left;  . CYSTOSCOPY WITH RETROGRADE PYELOGRAM, URETEROSCOPY AND STENT PLACEMENT Left 05/01/2015   Procedure: CYSTOSCOPY WITH RETROGRADE PYELOGRAM AND STENT PLACEMENT;  Surgeon: Festus Aloe, MD;  [facility];  Service: Urology;  Laterality: Left;  . CYSTOSCOPY WITH STENT PLACEMENT Bilateral 05/26/2015   Procedure: CYSTOSCOPY WITH STENT PLACEMENT;  Surgeon: Festus Aloe, MD;  Location: [facility];  Service: Urology;  Laterality: Bilateral;  . CYSTOSCOPY WITH URETEROSCOPY Bilateral 05/26/2015   Procedure: CYSTOSCOPY WITH URETEROSCOPY;  Surgeon: Festus Aloe, MD;  Location: [facility];  Service: Urology;  Laterality: Bilateral;  . CYSTOSCOPY/URETEROSCOPY/HOLMIUM LASER/STENT PLACEMENT Right 06/09/2015   Procedure: CYSTOSCOPY/URETEROSCOPY/STENT PLACEMENT REMOVAL LEFT URETERAL STENT;  Surgeon: Festus Aloe, MD;  [facility];  Service: Urology;  Laterality: Right;   . HOLMIUM LASER APPLICATION Left 32/67/1245   Procedure: HOLMIUM LASER APPLICATION;  Surgeon: Festus Aloe, MD;  [facility];  Service: Urology;  Laterality: Left;  . HOLMIUM LASER APPLICATION Right 80/99/8338   Procedure: HOLMIUM LASER APPLICATION;  Surgeon: Festus Aloe, MD;  [facility];  Service: Urology;  Laterality: Right;  . INSERTION OF MESH N/A 09/05/2016   Procedure: INSERTION OF MESH;  Surgeon: [name], MD;  Location: [place];  Service: General;  Laterality: N/A;  . LAPAROSCOPIC ASSISTED VAGINAL HYSTERECTOMY  04-23-2002   and Left Salpingoophorectomy/  Anterior Repair/  Tension Free Tape Sling placement  . LASER ABLATION OF THE CERVIX  x2  1980's  . TUBAL LIGATION  1980's  . VENTRAL HERNIA REPAIR N/A 09/05/2016   Procedure: LAPAROSCOPIC VENTRAL HERNIA;  Surgeon: [name], MD;  Location: [place];  Service: General;  Laterality: N/A;    reports that she has never smoked. She has never used smokeless tobacco. She reports that she does not drink alcohol or use drugs. family history includes Alcohol abuse in her mother; Arthritis in her father; Cancer in her maternal grandmother and paternal grandmother; Colon cancer in her maternal grandmother and mother; Heart disease in her father; Hyperlipidemia in her father; Hypertension in her father; Kidney disease in her paternal uncle; Lung cancer in her mother; Rectal cancer in her maternal grandmother. Allergies  Allergen Reactions  . Keflex [Cephalexin] Swelling    Tongue swelling     Review of Systems  Constitutional: Positive for fatigue. Negative for chills and unexpected weight change.  HENT: Negative for congestion, sinus pain and sore throat.   Respiratory: Negative for cough and shortness of breath.   Cardiovascular: Negative for chest pain.  Gastrointestinal: Negative for abdominal pain.  Genitourinary: Negative for dysuria and frequency.  Hematological: Negative for  adenopathy. Bruises/bleeds easily.       Objective:   Physical Exam Vitals reviewed.  Constitutional:      General: She is not in acute distress.    Appearance: Normal appearance. She is not ill-appearing.  Cardiovascular:     Rate and Rhythm: Normal rate and regular rhythm.  Pulmonary:     Effort: Pulmonary effort is normal.     Breath sounds: Normal breath sounds.  Musculoskeletal:     Cervical back: Neck supple.     Right lower leg: No edema.     Left lower leg: No edema.  Lymphadenopathy:     Cervical: No cervical adenopathy.  Skin:    Comments: She does have several small scattered bruises on her forearms bilaterally  Neurological:     Mental Status: She is alert.        Assessment:     #1 dry eyes and dry mouth symptoms.  Rule out Sjogren's syndrome.  She does have elevated blood sugars with fasting sugar today 137 and A1c of 6.7% but doubt  this accounts for her dry mouth symptoms  #2 type 2 diabetes range blood sugars as above.  #3 nontraumatic ecchymoses of uncertain significance  #4 attention deficit disorder.  Requesting refills of Adderall    Plan:     -Check CBC to assess her nontraumatic or spontaneous ecchymoses  -Check Sjogren's antibodies  -Refill Adderall for 3 months  -We discussed possible medical therapies for her hyperglycemia/type 2 diabetes.  We explained that we normally start with Metformin as a foundational drug for type 2 diabetes but she has strong interest in trying to improve her weight loss.  We will see if we can get Ozempic covered She does not have any known contraindications  -Discontinue Cymbalta and start back Celexa 20 mg once daily  -Office follow-up within 3 months to reassess A1c  [name] MD [facility]

## 2019-12-04 LAB — SJOGREN'S SYNDROME ANTIBODS(SSA + SSB)
SSA (Ro) (ENA) Antibody, IgG: <1 AI
SSB (La) (ENA) Antibody, IgG: <1 AI

## 2019-12-06 ENCOUNTER — Telehealth: Payer: Self-pay

## 2019-12-06 NOTE — Addendum Note (Signed)
Addended by: Marrion Coy on: 12/06/2019 10:41 AM   Modules accepted: Orders

## 2019-12-06 NOTE — Telephone Encounter (Signed)
I called and LMOVM advising that I needed her to call and schedule a lab visit for either today or Wed thru the end of the month/need to draw a CBC/thx dmf

## 2019-12-13 ENCOUNTER — Other Ambulatory Visit: Payer: Self-pay | Admitting: Family Medicine

## 2019-12-20 ENCOUNTER — Encounter: Payer: Self-pay | Admitting: Family Medicine

## 2020-01-09 ENCOUNTER — Encounter: Payer: Self-pay | Admitting: Family Medicine

## 2020-01-10 MED ORDER — OZEMPIC (0.25 OR 0.5 MG/DOSE) 2 MG/1.5ML ~~LOC~~ SOPN: 0.5000 mg | PEN_INJECTOR | SUBCUTANEOUS | 3 refills | Status: DC

## 2020-01-13 ENCOUNTER — Other Ambulatory Visit: Payer: Self-pay | Admitting: Family Medicine

## 2020-01-23 ENCOUNTER — Other Ambulatory Visit: Payer: Self-pay | Admitting: Neurological Surgery

## 2020-01-29 NOTE — Progress Notes (Signed)
[facility] [id number] [place] [address] Phone: [phone] Fax: [phone]      Your procedure is scheduled on Monday 02/03/2020.  Report to [facility] Main Entrance "A" at 05:30 A.M., and check in at the Admitting office.  Call this number if you have problems the morning of surgery:  [phone]  Call [phone] if you have any questions prior to your surgery date Monday-Friday 8am-4pm    Remember:  Do not eat or drink after midnight the night before your surgery     Take these medicines the morning of surgery with A SIP OF WATER: Atorvastatin (Lipitor) Citalopram (Celexa) Levothyroxine (Synthroid) Pantoprazole (Protonix)  If needed you may take the following: Ondansetron (Zofran) - if needed for nausea or vomiting Oxycodone-acetaminophen (Percocet/roxicet) - if needed for pain Sumatriptan (Imitrex) - if needed for migraine   As of today, STOP taking any Aspirin (unless otherwise instructed by your surgeon) Aleve, Naproxen, Ibuprofen, Motrin, Advil, Goody's, BC's, all herbal medications, fish oil, and all vitamins.                      Do not wear jewelry, make up, or nail polish            Do not wear lotions, powders, perfumes, or deodorant.            Do not shave 48 hours prior to surgery.  Men may shave face and neck.            Do not bring valuables to the hospital.            [facility] is not responsible for any belongings or valuables.  Do NOT Smoke (Tobacco/Vaping) or drink Alcohol 24 hours prior to your procedure  If you use a CPAP at night, you may bring all equipment for your overnight stay.   Contacts, glasses, dentures or bridgework may not be worn into surgery.      For patients admitted to the hospital, discharge time will be determined by your treatment team.   Patients discharged the day of surgery will not be allowed to drive home,  and someone needs to stay with them for 24 hours.    Special instructions:   [place]- Preparing For Surgery  Before surgery, you can play an important role. Because skin is not sterile, your skin needs to be as free of germs as possible. You can reduce the number of germs on your skin by washing with CHG (chlorahexidine gluconate) Soap before surgery.  CHG is an antiseptic cleaner which kills germs and bonds with the skin to continue killing germs even after washing.    Oral Hygiene is also important to reduce your risk of infection.  Remember - BRUSH YOUR TEETH THE MORNING OF SURGERY WITH YOUR REGULAR TOOTHPASTE  Please do not use if you have an allergy to CHG or antibacterial soaps. If your skin becomes reddened/irritated stop using the CHG.  Do not shave (including legs and underarms) for at least 48 hours prior to first CHG shower. It is OK to shave your face.  Please follow these instructions carefully.   1. Shower the NIGHT BEFORE SURGERY and the MORNING OF SURGERY with CHG Soap.   2. If you chose to wash your hair, wash your hair first as usual with your normal shampoo.  3. After you shampoo, rinse  your hair and body thoroughly to remove the shampoo.  4. Use CHG as you would any other liquid soap. You can apply CHG directly to the skin and wash gently with a scrungie or a clean washcloth.   5. Apply the CHG Soap to your body ONLY FROM THE NECK DOWN.  Do not use on open wounds or open sores. Avoid contact with your eyes, ears, mouth and genitals (private parts). Wash Face and genitals (private parts)  with your normal soap.   6. Wash thoroughly, paying special attention to the area where your surgery will be performed.  7. Thoroughly rinse your body with warm water from the neck down.  8. DO NOT shower/wash with your normal soap after using and rinsing off the CHG Soap.  9. Pat yourself dry with a CLEAN TOWEL.  10. Wear CLEAN PAJAMAS to bed the night before  surgery  11. Place CLEAN SHEETS on your bed the night of your first shower and DO NOT SLEEP WITH PETS.   Day of Surgery: Shower with CHG soap as directed Wear Clean/Comfortable clothing the morning of surgery Do not apply any deodorants/lotions.   Remember to brush your teeth WITH YOUR REGULAR TOOTHPASTE.   Please read over the following fact sheets that you were given.

## 2020-01-30 ENCOUNTER — Other Ambulatory Visit (HOSPITAL_COMMUNITY)
Admission: RE | Admit: 2020-01-30 | Discharge: 2020-01-30 | Disposition: A | Discharge status: Home / Self Care | Payer: Managed Care, Other (non HMO) | Source: Ambulatory Visit | Attending: Neurological Surgery | Admitting: Neurological Surgery

## 2020-01-30 ENCOUNTER — Encounter (HOSPITAL_COMMUNITY)
Admission: RE | Admit: 2020-01-30 | Discharge: 2020-01-30 | Disposition: A | Discharge status: Home / Self Care | Payer: Managed Care, Other (non HMO) | Source: Ambulatory Visit | Attending: Neurological Surgery | Admitting: Neurological Surgery

## 2020-01-30 ENCOUNTER — Other Ambulatory Visit: Payer: Self-pay

## 2020-01-30 ENCOUNTER — Telehealth: Payer: Self-pay | Admitting: Family Medicine

## 2020-01-30 ENCOUNTER — Encounter: Payer: Self-pay | Admitting: Family Medicine

## 2020-01-30 ENCOUNTER — Encounter (HOSPITAL_COMMUNITY): Payer: Self-pay

## 2020-01-30 DIAGNOSIS — Z20822 Contact with and (suspected) exposure to covid-19: Secondary | ICD-10-CM | POA: Insufficient documentation

## 2020-01-30 DIAGNOSIS — Z01818 Encounter for other preprocedural examination: Secondary | ICD-10-CM | POA: Insufficient documentation

## 2020-01-30 HISTORY — DX: Other complications of anesthesia, initial encounter: T88.59XA

## 2020-01-30 HISTORY — DX: Other specified postprocedural states: Z98.890

## 2020-01-30 HISTORY — DX: Prediabetes: R73.03

## 2020-01-30 HISTORY — DX: Nausea with vomiting, unspecified: R11.2

## 2020-01-30 LAB — HEMOGLOBIN A1C
Hgb A1c MFr Bld: 6.5 % — ABNORMAL HIGH (ref 4.8–5.6)
Mean Plasma Glucose: 139.85 mg/dL

## 2020-01-30 LAB — CBC
HCT: 38.6 % (ref 36.0–46.0)
Hemoglobin: 12.2 g/dL (ref 12.0–15.0)
MCH: 25.8 pg — ABNORMAL LOW (ref 26.0–34.0)
MCHC: 31.6 g/dL (ref 30.0–36.0)
MCV: 81.6 fL (ref 80.0–100.0)
Platelets: 456 10*3/uL — ABNORMAL HIGH (ref 150–400)
RBC: 4.73 MIL/uL (ref 3.87–5.11)
RDW: 14.4 % (ref 11.5–15.5)
WBC: 18.3 10*3/uL — ABNORMAL HIGH (ref 4.0–10.5)
nRBC: 0 % (ref 0.0–0.2)

## 2020-01-30 LAB — TYPE AND SCREEN
ABO/RH(D): A POS
Antibody Screen: NEGATIVE

## 2020-01-30 LAB — BASIC METABOLIC PANEL
Anion gap: 10 (ref 5–15)
BUN: 13 mg/dL (ref 6–20)
CO2: 23 mmol/L (ref 22–32)
Calcium: 9.3 mg/dL (ref 8.9–10.3)
Chloride: 105 mmol/L (ref 98–111)
Creatinine, Ser: 0.84 mg/dL (ref 0.44–1.00)
GFR calc Af Amer: >60 mL/min (ref >60)
GFR calc non Af Amer: >60 mL/min (ref >60)
Glucose, Bld: 175 mg/dL — ABNORMAL HIGH (ref 70–99)
Potassium: 3.7 mmol/L (ref 3.5–5.1)
Sodium: 138 mmol/L (ref 135–145)

## 2020-01-30 LAB — SURGICAL PCR SCREEN
MRSA, PCR: NEGATIVE
Staphylococcus aureus: POSITIVE — AB

## 2020-01-30 LAB — GLUCOSE, CAPILLARY: Glucose-Capillary: 133 mg/dL — ABNORMAL HIGH (ref 70–99)

## 2020-01-30 LAB — SARS CORONAVIRUS 2 (TAT 6-24 HRS): SARS Coronavirus 2: NEGATIVE

## 2020-01-30 NOTE — Progress Notes (Signed)
PCP - Dr. Carolann Littler Cardiologist - Denies  Chest x-ray - Not indicated EKG - 01/30/20 Stress Test -Denies  ECHO - Denies Cardiac Cath - Denies  Sleep Study - Has OSA CPAP - nightly  Fasting Blood Sugar - CBG at PAT 133 borderline Diabetic started OZempic recently Checks Blood Sugar ___0__ times a day  COVID TEST- 01/30/20  Anesthesia review: No  Patient denies shortness of breath, fever, cough and chest pain at PAT appointment.  Did have COVID last year with some residuals from it.   All instructions explained to the patient, with a verbal understanding of the material. Patient agrees to go over the instructions while at home for a better understanding. Patient also instructed to self quarantine after being tested for COVID-19. The opportunity to ask questions was provided.

## 2020-01-30 NOTE — Telephone Encounter (Signed)
Kyung Rudd from [employer] was calling to see if a disability form was filled out for the pt.   It was faxed on 07/02.  Please call him at [phone]  Please advise

## 2020-01-31 NOTE — Telephone Encounter (Signed)
Already told pt we would not be able to file out forms and only history in a patient message back in July

## 2020-02-02 NOTE — Anesthesia Preprocedure Evaluation (Addendum)
Anesthesia Evaluation  Patient identified by MRN, date of birth, ID band Patient awake    Reviewed: Allergy & Precautions, NPO status , Patient's Chart, lab work & pertinent test results  History of Anesthesia Complications (+) PONV and history of anesthetic complications  Airway Mallampati: IV  TM Distance: >3 FB Neck ROM: Full    Dental  (+) Teeth Intact, Dental Advisory Given, Caps,    Pulmonary sleep apnea and Continuous Positive Airway Pressure Ventilation ,    Pulmonary exam normal breath sounds clear to auscultation       Cardiovascular hypertension, Pt. on medications Normal cardiovascular exam Rhythm:Regular Rate:Normal     Neuro/Psych  Headaches, PSYCHIATRIC DISORDERS Anxiety Depression : Lumbar stenosis with neurogenic claudication  Neuromuscular disease    GI/Hepatic Neg liver ROS, GERD  Medicated,  Endo/Other  Hypothyroidism Morbid obesity  Renal/GU Renal InsufficiencyRenal disease     Musculoskeletal  (+) Arthritis ,   Abdominal   Peds  (+) ATTENTION DEFICIT DISORDER WITHOUT HYPERACTIVITY Hematology negative hematology ROS (+)   Anesthesia Other Findings   Reproductive/Obstetrics                           Anesthesia Physical Anesthesia Plan  ASA: III  Anesthesia Plan: General   Post-op Pain Management:    Induction: Intravenous  PONV Risk Score and Plan: 4 or greater and Scopolamine patch - Pre-op, Diphenhydramine, Midazolam, Propofol infusion, Dexamethasone and Ondansetron  Airway Management Planned: Oral ETT and Video Laryngoscope Planned  Additional Equipment:   Intra-op Plan:   Post-operative Plan: Extubation in OR  Informed Consent: I have reviewed the patients History and Physical, chart, labs and discussed the procedure including the risks, benefits and alternatives for the proposed anesthesia with the patient or authorized representative who has indicated  his/her understanding and acceptance.     Dental advisory given  Plan Discussed with: CRNA  Anesthesia Plan Comments: (2nd PIV)       Anesthesia Quick Evaluation

## 2020-02-03 ENCOUNTER — Inpatient Hospital Stay (HOSPITAL_COMMUNITY)
Admission: RE | Disposition: A | Discharge status: Home / Self Care | Payer: Self-pay | Source: Home / Self Care | Attending: Neurological Surgery

## 2020-02-03 ENCOUNTER — Inpatient Hospital Stay (HOSPITAL_COMMUNITY): Payer: Managed Care, Other (non HMO) | Admitting: Physician Assistant

## 2020-02-03 ENCOUNTER — Inpatient Hospital Stay (HOSPITAL_COMMUNITY): Payer: Managed Care, Other (non HMO) | Admitting: Anesthesiology

## 2020-02-03 ENCOUNTER — Inpatient Hospital Stay (HOSPITAL_COMMUNITY)
Admission: RE | Admit: 2020-02-03 | Discharge: 2020-02-05 | DRG: 454 | Disposition: A | Discharge status: Home / Self Care | Payer: Managed Care, Other (non HMO) | Attending: Neurological Surgery | Admitting: Neurological Surgery

## 2020-02-03 ENCOUNTER — Encounter (HOSPITAL_COMMUNITY): Payer: Self-pay | Admitting: Neurological Surgery

## 2020-02-03 ENCOUNTER — Inpatient Hospital Stay (HOSPITAL_COMMUNITY): Payer: Managed Care, Other (non HMO)

## 2020-02-03 DIAGNOSIS — R197 Diarrhea, unspecified: Secondary | ICD-10-CM | POA: Diagnosis not present

## 2020-02-03 DIAGNOSIS — K219 Gastro-esophageal reflux disease without esophagitis: Secondary | ICD-10-CM | POA: Diagnosis present

## 2020-02-03 DIAGNOSIS — E559 Vitamin D deficiency, unspecified: Secondary | ICD-10-CM | POA: Diagnosis present

## 2020-02-03 DIAGNOSIS — F419 Anxiety disorder, unspecified: Secondary | ICD-10-CM | POA: Diagnosis present

## 2020-02-03 DIAGNOSIS — E785 Hyperlipidemia, unspecified: Secondary | ICD-10-CM | POA: Diagnosis present

## 2020-02-03 DIAGNOSIS — G2581 Restless legs syndrome: Secondary | ICD-10-CM | POA: Diagnosis present

## 2020-02-03 DIAGNOSIS — M1990 Unspecified osteoarthritis, unspecified site: Secondary | ICD-10-CM | POA: Diagnosis present

## 2020-02-03 DIAGNOSIS — Z79890 Hormone replacement therapy (postmenopausal): Secondary | ICD-10-CM | POA: Diagnosis not present

## 2020-02-03 DIAGNOSIS — F900 Attention-deficit hyperactivity disorder, predominantly inattentive type: Secondary | ICD-10-CM | POA: Diagnosis present

## 2020-02-03 DIAGNOSIS — Z8249 Family history of ischemic heart disease and other diseases of the circulatory system: Secondary | ICD-10-CM

## 2020-02-03 DIAGNOSIS — M4180 Other forms of scoliosis, site unspecified: Secondary | ICD-10-CM | POA: Diagnosis present

## 2020-02-03 DIAGNOSIS — Z981 Arthrodesis status: Secondary | ICD-10-CM | POA: Diagnosis not present

## 2020-02-03 DIAGNOSIS — E039 Hypothyroidism, unspecified: Secondary | ICD-10-CM | POA: Diagnosis present

## 2020-02-03 DIAGNOSIS — M5416 Radiculopathy, lumbar region: Secondary | ICD-10-CM | POA: Diagnosis present

## 2020-02-03 DIAGNOSIS — N189 Chronic kidney disease, unspecified: Secondary | ICD-10-CM | POA: Diagnosis present

## 2020-02-03 DIAGNOSIS — Z881 Allergy status to other antibiotic agents status: Secondary | ICD-10-CM

## 2020-02-03 DIAGNOSIS — G4733 Obstructive sleep apnea (adult) (pediatric): Secondary | ICD-10-CM | POA: Diagnosis present

## 2020-02-03 DIAGNOSIS — M48062 Spinal stenosis, lumbar region with neurogenic claudication: Secondary | ICD-10-CM | POA: Diagnosis present

## 2020-02-03 DIAGNOSIS — I129 Hypertensive chronic kidney disease with stage 1 through stage 4 chronic kidney disease, or unspecified chronic kidney disease: Secondary | ICD-10-CM | POA: Diagnosis present

## 2020-02-03 DIAGNOSIS — R7303 Prediabetes: Secondary | ICD-10-CM | POA: Diagnosis present

## 2020-02-03 DIAGNOSIS — G43909 Migraine, unspecified, not intractable, without status migrainosus: Secondary | ICD-10-CM | POA: Diagnosis present

## 2020-02-03 DIAGNOSIS — M19011 Primary osteoarthritis, right shoulder: Secondary | ICD-10-CM | POA: Diagnosis present

## 2020-02-03 DIAGNOSIS — Z83438 Family history of other disorder of lipoprotein metabolism and other lipidemia: Secondary | ICD-10-CM

## 2020-02-03 DIAGNOSIS — Z79899 Other long term (current) drug therapy: Secondary | ICD-10-CM

## 2020-02-03 DIAGNOSIS — Z8261 Family history of arthritis: Secondary | ICD-10-CM

## 2020-02-03 DIAGNOSIS — Z6841 Body Mass Index (BMI) 40.0 and over, adult: Secondary | ICD-10-CM

## 2020-02-03 DIAGNOSIS — F329 Major depressive disorder, single episode, unspecified: Secondary | ICD-10-CM | POA: Diagnosis present

## 2020-02-03 DIAGNOSIS — Z419 Encounter for procedure for purposes other than remedying health state, unspecified: Secondary | ICD-10-CM

## 2020-02-03 DIAGNOSIS — Z8541 Personal history of malignant neoplasm of cervix uteri: Secondary | ICD-10-CM

## 2020-02-03 DIAGNOSIS — Z87442 Personal history of urinary calculi: Secondary | ICD-10-CM

## 2020-02-03 HISTORY — PX: POSTERIOR LAMINECTOMY / DECOMPRESSION LUMBAR SPINE: SUR740

## 2020-02-03 LAB — POCT I-STAT, CHEM 8
BUN: 13 mg/dL (ref 6–20)
Calcium, Ion: 1.18 mmol/L (ref 1.15–1.40)
Chloride: 101 mmol/L (ref 98–111)
Creatinine, Ser: 0.7 mg/dL (ref 0.44–1.00)
Glucose, Bld: 122 mg/dL — ABNORMAL HIGH (ref 70–99)
HCT: 32 % — ABNORMAL LOW (ref 36.0–46.0)
Hemoglobin: 10.9 g/dL — ABNORMAL LOW (ref 12.0–15.0)
Potassium: 4.1 mmol/L (ref 3.5–5.1)
Sodium: 138 mmol/L (ref 135–145)
TCO2: 25 mmol/L (ref 22–32)

## 2020-02-03 SURGERY — POSTERIOR LUMBAR FUSION 1 LEVEL
Anesthesia: General | Site: Back

## 2020-02-03 MED ORDER — DEXAMETHASONE SODIUM PHOSPHATE 10 MG/ML IJ SOLN
INTRAMUSCULAR | Status: AC
  Filled 2020-02-03: qty 1

## 2020-02-03 MED ORDER — ONDANSETRON HCL 4 MG/2ML IJ SOLN
INTRAMUSCULAR | Status: AC
  Filled 2020-02-03: qty 2

## 2020-02-03 MED ORDER — PRAMIPEXOLE DIHYDROCHLORIDE 0.25 MG PO TABS: 0.1250 mg | ORAL_TABLET | Freq: Every day | ORAL | Status: DC | PRN

## 2020-02-03 MED ORDER — MENTHOL 3 MG MT LOZG: 1.0000 | LOZENGE | OROMUCOSAL | Status: DC | PRN

## 2020-02-03 MED ORDER — METHOCARBAMOL 500 MG PO TABS
500.0000 mg | ORAL_TABLET | Freq: Four times a day (QID) | ORAL | Status: DC | PRN
  Administered 2020-02-03 – 2020-02-05 (×7): 500 mg via ORAL
  Filled 2020-02-03 (×6): qty 1

## 2020-02-03 MED ORDER — LEVOTHYROXINE SODIUM 25 MCG PO TABS
50.0000 ug | ORAL_TABLET | Freq: Every day | ORAL | Status: DC
  Administered 2020-02-04 – 2020-02-05 (×2): 50 ug via ORAL
  Filled 2020-02-03 (×2): qty 2

## 2020-02-03 MED ORDER — PROMETHAZINE HCL 25 MG/ML IJ SOLN: 6.2500 mg | INTRAMUSCULAR | Status: DC | PRN

## 2020-02-03 MED ORDER — PHENYLEPHRINE HCL-NACL 10-0.9 MG/250ML-% IV SOLN
INTRAVENOUS | Status: DC | PRN
  Administered 2020-02-03: 20 ug/min via INTRAVENOUS

## 2020-02-03 MED ORDER — SODIUM CHLORIDE 0.9% FLUSH
3.0000 mL | Freq: Two times a day (BID) | INTRAVENOUS | Status: DC
  Administered 2020-02-03 (×2): 3 mL via INTRAVENOUS

## 2020-02-03 MED ORDER — FENTANYL CITRATE (PF) 100 MCG/2ML IJ SOLN
INTRAMUSCULAR | Status: AC
  Administered 2020-02-03: 50 ug via INTRAVENOUS
  Filled 2020-02-03: qty 2

## 2020-02-03 MED ORDER — MIDAZOLAM HCL 5 MG/5ML IJ SOLN
INTRAMUSCULAR | Status: DC | PRN
  Administered 2020-02-03: 2 mg via INTRAVENOUS

## 2020-02-03 MED ORDER — SUGAMMADEX SODIUM 200 MG/2ML IV SOLN
INTRAVENOUS | Status: DC | PRN
Start: 2020-02-03 — End: 2020-02-03
  Administered 2020-02-03: 400 mg via INTRAVENOUS

## 2020-02-03 MED ORDER — SEMAGLUTIDE(0.25 OR 0.5MG/DOS) 2 MG/1.5ML ~~LOC~~ SOPN: 0.5000 mg | PEN_INJECTOR | SUBCUTANEOUS | Status: DC

## 2020-02-03 MED ORDER — ACETAMINOPHEN 650 MG RE SUPP: 650.0000 mg | RECTAL | Status: DC | PRN

## 2020-02-03 MED ORDER — FENTANYL CITRATE (PF) 250 MCG/5ML IJ SOLN
INTRAMUSCULAR | Status: AC
  Filled 2020-02-03: qty 5

## 2020-02-03 MED ORDER — KETAMINE HCL 50 MG/5ML IJ SOSY
PREFILLED_SYRINGE | INTRAMUSCULAR | Status: AC
  Filled 2020-02-03: qty 5

## 2020-02-03 MED ORDER — PROPOFOL 1000 MG/100ML IV EMUL
INTRAVENOUS | Status: AC
  Filled 2020-02-03: qty 100

## 2020-02-03 MED ORDER — LACTATED RINGERS IV SOLN: INTRAVENOUS | Status: DC

## 2020-02-03 MED ORDER — LIVING WELL WITH DIABETES BOOK
Freq: Once | Status: AC
  Filled 2020-02-03: qty 1

## 2020-02-03 MED ORDER — ONDANSETRON HCL 4 MG/2ML IJ SOLN
INTRAMUSCULAR | Status: AC
  Administered 2020-02-03: 4 mg
  Filled 2020-02-03: qty 2

## 2020-02-03 MED ORDER — THROMBIN 5000 UNITS EX SOLR
CUTANEOUS | Status: AC
  Filled 2020-02-03: qty 5000

## 2020-02-03 MED ORDER — SCOPOLAMINE 1 MG/3DAYS TD PT72
1.0000 | MEDICATED_PATCH | Freq: Once | TRANSDERMAL | Status: DC
  Administered 2020-02-03: 1.5 mg via TRANSDERMAL
  Filled 2020-02-03: qty 1

## 2020-02-03 MED ORDER — PANTOPRAZOLE SODIUM 40 MG PO TBEC
40.0000 mg | DELAYED_RELEASE_TABLET | Freq: Two times a day (BID) | ORAL | Status: DC
  Administered 2020-02-03 – 2020-02-05 (×4): 40 mg via ORAL
  Filled 2020-02-03 (×4): qty 1

## 2020-02-03 MED ORDER — OXYCODONE-ACETAMINOPHEN 5-325 MG PO TABS
ORAL_TABLET | ORAL | Status: AC
  Filled 2020-02-03: qty 2

## 2020-02-03 MED ORDER — SENNA 8.6 MG PO TABS
1.0000 | ORAL_TABLET | Freq: Two times a day (BID) | ORAL | Status: DC
  Administered 2020-02-03: 8.6 mg via ORAL
  Filled 2020-02-03: qty 1

## 2020-02-03 MED ORDER — SODIUM CHLORIDE 0.9% FLUSH: 3.0000 mL | INTRAVENOUS | Status: DC | PRN

## 2020-02-03 MED ORDER — FLEET ENEMA 7-19 GM/118ML RE ENEM: 1.0000 | ENEMA | Freq: Once | RECTAL | Status: DC | PRN

## 2020-02-03 MED ORDER — METHOCARBAMOL 500 MG PO TABS
ORAL_TABLET | ORAL | Status: AC
  Filled 2020-02-03: qty 1

## 2020-02-03 MED ORDER — PHENOL 1.4 % MT LIQD: 1.0000 | OROMUCOSAL | Status: DC | PRN

## 2020-02-03 MED ORDER — ATORVASTATIN CALCIUM 10 MG PO TABS
10.0000 mg | ORAL_TABLET | Freq: Every day | ORAL | Status: DC
  Administered 2020-02-03 – 2020-02-04 (×2): 10 mg via ORAL
  Filled 2020-02-03 (×2): qty 1

## 2020-02-03 MED ORDER — ONDANSETRON HCL 4 MG/2ML IJ SOLN: 4.0000 mg | Freq: Four times a day (QID) | INTRAMUSCULAR | Status: DC | PRN

## 2020-02-03 MED ORDER — ONDANSETRON HCL 4 MG/2ML IJ SOLN
INTRAMUSCULAR | Status: DC | PRN
  Administered 2020-02-03: 4 mg via INTRAVENOUS

## 2020-02-03 MED ORDER — LIDOCAINE-EPINEPHRINE 1 %-1:100000 IJ SOLN
INTRAMUSCULAR | Status: DC | PRN
  Administered 2020-02-03: 5 mL

## 2020-02-03 MED ORDER — HYDROCHLOROTHIAZIDE 12.5 MG PO CAPS
12.5000 mg | ORAL_CAPSULE | Freq: Every day | ORAL | Status: DC
  Administered 2020-02-03: 12.5 mg via ORAL
  Filled 2020-02-03: qty 1

## 2020-02-03 MED ORDER — ONDANSETRON 4 MG PO TBDP
8.0000 mg | ORAL_TABLET | Freq: Three times a day (TID) | ORAL | Status: DC | PRN
  Filled 2020-02-03: qty 2

## 2020-02-03 MED ORDER — ACETAMINOPHEN 325 MG PO TABS: 650.0000 mg | ORAL_TABLET | ORAL | Status: DC | PRN

## 2020-02-03 MED ORDER — LIDOCAINE HCL (CARDIAC) PF 100 MG/5ML IV SOSY
PREFILLED_SYRINGE | INTRAVENOUS | Status: DC | PRN
  Administered 2020-02-03: 100 mg via INTRAVENOUS

## 2020-02-03 MED ORDER — KETOROLAC TROMETHAMINE 15 MG/ML IJ SOLN
15.0000 mg | Freq: Four times a day (QID) | INTRAMUSCULAR | Status: AC
  Administered 2020-02-03 – 2020-02-04 (×4): 15 mg via INTRAVENOUS
  Filled 2020-02-03 (×4): qty 1

## 2020-02-03 MED ORDER — PHENYLEPHRINE 40 MCG/ML (10ML) SYRINGE FOR IV PUSH (FOR BLOOD PRESSURE SUPPORT)
PREFILLED_SYRINGE | INTRAVENOUS | Status: AC
  Filled 2020-02-03: qty 10

## 2020-02-03 MED ORDER — CHLORHEXIDINE GLUCONATE CLOTH 2 % EX PADS: 6.0000 | MEDICATED_PAD | Freq: Once | CUTANEOUS | Status: DC

## 2020-02-03 MED ORDER — PROPOFOL 10 MG/ML IV BOLUS
INTRAVENOUS | Status: AC
  Filled 2020-02-03: qty 20

## 2020-02-03 MED ORDER — MIDAZOLAM HCL 2 MG/2ML IJ SOLN
INTRAMUSCULAR | Status: AC
  Filled 2020-02-03: qty 2

## 2020-02-03 MED ORDER — KETAMINE HCL 10 MG/ML IJ SOLN
INTRAMUSCULAR | Status: DC | PRN
Start: 2020-02-03 — End: 2020-02-03
  Administered 2020-02-03 (×2): 10 mg via INTRAVENOUS
  Administered 2020-02-03: 20 mg via INTRAVENOUS
  Administered 2020-02-03: 10 mg via INTRAVENOUS

## 2020-02-03 MED ORDER — LOSARTAN POTASSIUM 50 MG PO TABS
50.0000 mg | ORAL_TABLET | Freq: Every day | ORAL | Status: DC
  Administered 2020-02-03: 50 mg via ORAL
  Filled 2020-02-03: qty 1

## 2020-02-03 MED ORDER — ROCURONIUM BROMIDE 10 MG/ML (PF) SYRINGE
PREFILLED_SYRINGE | INTRAVENOUS | Status: AC
  Filled 2020-02-03: qty 10

## 2020-02-03 MED ORDER — KETOROLAC TROMETHAMINE 15 MG/ML IJ SOLN
INTRAMUSCULAR | Status: AC
  Filled 2020-02-03: qty 1

## 2020-02-03 MED ORDER — SUCCINYLCHOLINE CHLORIDE 200 MG/10ML IV SOSY
PREFILLED_SYRINGE | INTRAVENOUS | Status: AC
  Filled 2020-02-03: qty 10

## 2020-02-03 MED ORDER — 0.9 % SODIUM CHLORIDE (POUR BTL) OPTIME
TOPICAL | Status: DC | PRN
  Administered 2020-02-03: 1000 mL

## 2020-02-03 MED ORDER — PHENYLEPHRINE HCL (PRESSORS) 10 MG/ML IV SOLN
INTRAVENOUS | Status: DC | PRN
Start: 2020-02-03 — End: 2020-02-03
  Administered 2020-02-03 (×3): 80 ug via INTRAVENOUS
  Administered 2020-02-03: 120 ug via INTRAVENOUS
  Administered 2020-02-03 (×2): 80 ug via INTRAVENOUS

## 2020-02-03 MED ORDER — ACETAMINOPHEN 500 MG PO TABS
1000.0000 mg | ORAL_TABLET | Freq: Once | ORAL | Status: AC
  Administered 2020-02-03: 1000 mg via ORAL
  Filled 2020-02-03: qty 2

## 2020-02-03 MED ORDER — EPHEDRINE 5 MG/ML INJ
INTRAVENOUS | Status: AC
  Filled 2020-02-03: qty 10

## 2020-02-03 MED ORDER — LIDOCAINE 2% (20 MG/ML) 5 ML SYRINGE
INTRAMUSCULAR | Status: AC
  Filled 2020-02-03: qty 5

## 2020-02-03 MED ORDER — DEXAMETHASONE SODIUM PHOSPHATE 10 MG/ML IJ SOLN
INTRAMUSCULAR | Status: DC | PRN
Start: 2020-02-03 — End: 2020-02-03
  Administered 2020-02-03: 10 mg via INTRAVENOUS

## 2020-02-03 MED ORDER — LIDOCAINE-EPINEPHRINE 1 %-1:100000 IJ SOLN
INTRAMUSCULAR | Status: AC
  Filled 2020-02-03: qty 1

## 2020-02-03 MED ORDER — METHOCARBAMOL 1000 MG/10ML IJ SOLN
500.0000 mg | Freq: Four times a day (QID) | INTRAVENOUS | Status: DC | PRN
  Filled 2020-02-03: qty 5

## 2020-02-03 MED ORDER — BUPIVACAINE HCL (PF) 0.5 % IJ SOLN
INTRAMUSCULAR | Status: AC
  Filled 2020-02-03: qty 30

## 2020-02-03 MED ORDER — FENTANYL CITRATE (PF) 100 MCG/2ML IJ SOLN
25.0000 ug | INTRAMUSCULAR | Status: DC | PRN
Start: 2020-02-03 — End: 2020-02-03
  Administered 2020-02-03: 50 ug via INTRAVENOUS

## 2020-02-03 MED ORDER — SODIUM CHLORIDE 0.9 % IV SOLN
INTRAVENOUS | Status: DC | PRN
  Administered 2020-02-03: 500 mL

## 2020-02-03 MED ORDER — DOCUSATE SODIUM 100 MG PO CAPS
100.0000 mg | ORAL_CAPSULE | Freq: Two times a day (BID) | ORAL | Status: DC
  Administered 2020-02-03: 100 mg via ORAL
  Filled 2020-02-03: qty 1

## 2020-02-03 MED ORDER — SUMATRIPTAN SUCCINATE 100 MG PO TABS
100.0000 mg | ORAL_TABLET | ORAL | Status: DC | PRN
  Filled 2020-02-03: qty 1

## 2020-02-03 MED ORDER — POLYETHYLENE GLYCOL 3350 17 G PO PACK: 17.0000 g | PACK | Freq: Every day | ORAL | Status: DC | PRN

## 2020-02-03 MED ORDER — VANCOMYCIN HCL IN DEXTROSE 1-5 GM/200ML-% IV SOLN
1000.0000 mg | INTRAVENOUS | Status: AC
  Administered 2020-02-03: 1000 mg via INTRAVENOUS
  Filled 2020-02-03: qty 200

## 2020-02-03 MED ORDER — FUROSEMIDE 20 MG PO TABS: 20.0000 mg | ORAL_TABLET | Freq: Every day | ORAL | Status: DC | PRN

## 2020-02-03 MED ORDER — PROPOFOL 500 MG/50ML IV EMUL
INTRAVENOUS | Status: DC | PRN
Start: 2020-02-03 — End: 2020-02-03
  Administered 2020-02-03: 25 ug/kg/min via INTRAVENOUS

## 2020-02-03 MED ORDER — AMPHETAMINE-DEXTROAMPHETAMINE 10 MG PO TABS
30.0000 mg | ORAL_TABLET | Freq: Two times a day (BID) | ORAL | Status: DC
  Administered 2020-02-04 – 2020-02-05 (×3): 30 mg via ORAL
  Filled 2020-02-03 (×3): qty 3

## 2020-02-03 MED ORDER — OXYCODONE-ACETAMINOPHEN 5-325 MG PO TABS
1.0000 | ORAL_TABLET | ORAL | Status: DC | PRN
  Administered 2020-02-03 – 2020-02-05 (×11): 2 via ORAL
  Filled 2020-02-03 (×10): qty 2

## 2020-02-03 MED ORDER — ROCURONIUM BROMIDE 100 MG/10ML IV SOLN
INTRAVENOUS | Status: DC | PRN
  Administered 2020-02-03: 60 mg via INTRAVENOUS
  Administered 2020-02-03: 20 mg via INTRAVENOUS
  Administered 2020-02-03: 40 mg via INTRAVENOUS
  Administered 2020-02-03: 20 mg via INTRAVENOUS

## 2020-02-03 MED ORDER — ORAL CARE MOUTH RINSE: 15.0000 mL | Freq: Once | OROMUCOSAL | Status: AC

## 2020-02-03 MED ORDER — HYDROMORPHONE HCL 1 MG/ML IJ SOLN
0.5000 mg | INTRAMUSCULAR | Status: DC | PRN
  Administered 2020-02-03: 1 mg via INTRAVENOUS
  Filled 2020-02-03: qty 1

## 2020-02-03 MED ORDER — CHLORHEXIDINE GLUCONATE 0.12 % MT SOLN
15.0000 mL | Freq: Once | OROMUCOSAL | Status: AC
  Administered 2020-02-03: 15 mL via OROMUCOSAL
  Filled 2020-02-03: qty 15

## 2020-02-03 MED ORDER — ALUM & MAG HYDROXIDE-SIMETH 200-200-20 MG/5ML PO SUSP: 30.0000 mL | Freq: Four times a day (QID) | ORAL | Status: DC | PRN

## 2020-02-03 MED ORDER — CITALOPRAM HYDROBROMIDE 20 MG PO TABS
20.0000 mg | ORAL_TABLET | Freq: Every day | ORAL | Status: DC
  Administered 2020-02-03 – 2020-02-04 (×2): 20 mg via ORAL
  Filled 2020-02-03 (×2): qty 1

## 2020-02-03 MED ORDER — LACTATED RINGERS IV SOLN
INTRAVENOUS | Status: DC | PRN
Start: 2020-02-03 — End: 2020-02-03

## 2020-02-03 MED ORDER — LOSARTAN POTASSIUM-HCTZ 50-12.5 MG PO TABS: 1.0000 | ORAL_TABLET | Freq: Every day | ORAL | Status: DC

## 2020-02-03 MED ORDER — BISACODYL 10 MG RE SUPP: 10.0000 mg | Freq: Every day | RECTAL | Status: DC | PRN

## 2020-02-03 MED ORDER — PROPOFOL 10 MG/ML IV BOLUS
INTRAVENOUS | Status: DC | PRN
  Administered 2020-02-03: 170 mg via INTRAVENOUS

## 2020-02-03 MED ORDER — BUPIVACAINE HCL (PF) 0.5 % IJ SOLN
INTRAMUSCULAR | Status: DC | PRN
  Administered 2020-02-03: 25 mL
  Administered 2020-02-03: 5 mL

## 2020-02-03 MED ORDER — ONDANSETRON HCL 4 MG PO TABS: 4.0000 mg | ORAL_TABLET | Freq: Four times a day (QID) | ORAL | Status: DC | PRN

## 2020-02-03 MED ORDER — ALBUMIN HUMAN 5 % IV SOLN
INTRAVENOUS | Status: DC | PRN
Start: 2020-02-03 — End: 2020-02-03

## 2020-02-03 MED ORDER — THROMBIN 5000 UNITS EX SOLR
OROMUCOSAL | Status: DC | PRN
  Administered 2020-02-03 (×2): 5 mL via TOPICAL

## 2020-02-03 MED ORDER — FENTANYL CITRATE (PF) 250 MCG/5ML IJ SOLN
INTRAMUSCULAR | Status: DC | PRN
  Administered 2020-02-03: 50 ug via INTRAVENOUS
  Administered 2020-02-03: 100 ug via INTRAVENOUS
  Administered 2020-02-03: 50 ug via INTRAVENOUS

## 2020-02-03 SURGICAL SUPPLY — 65 items
ADH SKN CLS APL DERMABOND .7 (GAUZE/BANDAGES/DRESSINGS) ×1
APL SRG 60D 8 XTD TIP BNDBL (TIP)
BAG DECANTER FOR FLEXI CONT (MISCELLANEOUS) ×3 IMPLANT
BASKET BONE COLLECTION (BASKET) ×3 IMPLANT
BLADE CLIPPER SURG (BLADE) IMPLANT
BONE CANC CHIPS 20CC PCAN1/4 (Bone Implant) ×3 IMPLANT
BUR MATCHSTICK NEURO 3.0 LAGG (BURR) ×3 IMPLANT
CAGE COROENT PLIF 10X28-8 LUMB (Cage) ×4 IMPLANT
CANISTER SUCT 3000ML PPV (MISCELLANEOUS) ×3 IMPLANT
CHIPS CANC BONE 20CC PCAN1/4 (Bone Implant) ×1 IMPLANT
CNTNR URN SCR LID CUP LEK RST (MISCELLANEOUS) ×1 IMPLANT
CONT SPEC 4OZ STRL OR WHT (MISCELLANEOUS) ×3
COVER BACK TABLE 60X90IN (DRAPES) ×3 IMPLANT
COVER WAND RF STERILE (DRAPES) ×1 IMPLANT
DECANTER SPIKE VIAL GLASS SM (MISCELLANEOUS) ×3 IMPLANT
DERMABOND ADVANCED (GAUZE/BANDAGES/DRESSINGS) ×2
DERMABOND ADVANCED .7 DNX12 (GAUZE/BANDAGES/DRESSINGS) ×1 IMPLANT
DEVICE DISSECT PLASMABLAD 3.0S (MISCELLANEOUS) ×1 IMPLANT
DRAPE C-ARM 42X72 X-RAY (DRAPES) ×6 IMPLANT
DRAPE HALF SHEET 40X57 (DRAPES) ×2 IMPLANT
DRAPE LAPAROTOMY 100X72X124 (DRAPES) ×3 IMPLANT
DRSG OPSITE POSTOP 4X8 (GAUZE/BANDAGES/DRESSINGS) ×2 IMPLANT
DURAPREP 26ML APPLICATOR (WOUND CARE) ×3 IMPLANT
DURASEAL APPLICATOR TIP (TIP) IMPLANT
DURASEAL SPINE SEALANT 3ML (MISCELLANEOUS) IMPLANT
ELECT REM PT RETURN 9FT ADLT (ELECTROSURGICAL) ×3
ELECTRODE REM PT RTRN 9FT ADLT (ELECTROSURGICAL) ×1 IMPLANT
GAUZE 4X4 16PLY RFD (DISPOSABLE) ×2 IMPLANT
GAUZE SPONGE 4X4 12PLY STRL (GAUZE/BANDAGES/DRESSINGS) ×3 IMPLANT
GLOVE BIOGEL PI IND STRL 8.5 (GLOVE) ×2 IMPLANT
GLOVE BIOGEL PI INDICATOR 8.5 (GLOVE) ×4
GLOVE ECLIPSE 8.5 STRL (GLOVE) ×6 IMPLANT
GOWN STRL REUS W/ TWL LRG LVL3 (GOWN DISPOSABLE) IMPLANT
GOWN STRL REUS W/ TWL XL LVL3 (GOWN DISPOSABLE) IMPLANT
GOWN STRL REUS W/TWL 2XL LVL3 (GOWN DISPOSABLE) ×6 IMPLANT
GOWN STRL REUS W/TWL LRG LVL3 (GOWN DISPOSABLE)
GOWN STRL REUS W/TWL XL LVL3 (GOWN DISPOSABLE)
GRAFT BNE CANC CHIPS 1-8 20CC (Bone Implant) IMPLANT
HEMOSTAT POWDER KIT SURGIFOAM (HEMOSTASIS) ×4 IMPLANT
KIT BASIN OR (CUSTOM PROCEDURE TRAY) ×3 IMPLANT
KIT TURNOVER KIT B (KITS) ×3 IMPLANT
MILL MEDIUM DISP (BLADE) ×3 IMPLANT
NEEDLE HYPO 22GX1.5 SAFETY (NEEDLE) ×3 IMPLANT
NS IRRIG 1000ML POUR BTL (IV SOLUTION) ×3 IMPLANT
PACK LAMINECTOMY NEURO (CUSTOM PROCEDURE TRAY) ×3 IMPLANT
PAD ARMBOARD 7.5X6 YLW CONV (MISCELLANEOUS) ×9 IMPLANT
PATTIES SURGICAL .5 X1 (DISPOSABLE) ×3 IMPLANT
PATTIES SURGICAL 1X1 (DISPOSABLE) ×2 IMPLANT
PLASMABLADE 3.0S (MISCELLANEOUS) ×3
ROD RELINE LOROTIC TI 5.5X55MM (Rod) ×4 IMPLANT
SCREW LOCK RELINE 5.5 TULIP (Screw) ×12 IMPLANT
SCREW RELINE-O POLY 6.5X45 (Screw) ×4 IMPLANT
SPONGE LAP 4X18 RFD (DISPOSABLE) IMPLANT
SPONGE SURGIFOAM ABS GEL 100 (HEMOSTASIS) ×1 IMPLANT
SUT PROLENE 6 0 BV (SUTURE) IMPLANT
SUT VIC AB 1 CT1 18XBRD ANBCTR (SUTURE) ×1 IMPLANT
SUT VIC AB 1 CT1 8-18 (SUTURE) ×3
SUT VIC AB 2-0 CP2 18 (SUTURE) ×3 IMPLANT
SUT VIC AB 3-0 SH 8-18 (SUTURE) ×3 IMPLANT
SUT VIC AB 4-0 RB1 18 (SUTURE) ×3 IMPLANT
SYR 3ML LL SCALE MARK (SYRINGE) ×12 IMPLANT
TOWEL GREEN STERILE (TOWEL DISPOSABLE) ×3 IMPLANT
TOWEL GREEN STERILE FF (TOWEL DISPOSABLE) ×3 IMPLANT
TRAY FOLEY MTR SLVR 16FR STAT (SET/KITS/TRAYS/PACK) ×3 IMPLANT
WATER STERILE IRR 1000ML POUR (IV SOLUTION) ×3 IMPLANT

## 2020-02-03 NOTE — Anesthesia Procedure Notes (Signed)
Procedure Name: Intubation Date/Time: 02/03/2020 7:45 AM Performed by: Airianna Kreischer T, CRNA Pre-anesthesia Checklist: Patient identified, Emergency Drugs available, Suction available and Patient being monitored Patient Re-evaluated:Patient Re-evaluated prior to induction Oxygen Delivery Method: Circle system utilized Preoxygenation: Pre-oxygenation with 100% oxygen Induction Type: IV induction Ventilation: Mask ventilation without difficulty and Oral airway inserted - appropriate to patient size Laryngoscope Size: Glidescope and 3 Tube type: Oral Tube size: 7.0 mm Number of attempts: 1 Airway Equipment and Method: Rigid stylet and Video-laryngoscopy Placement Confirmation: ETT inserted through vocal cords under direct vision,  positive ETCO2 and breath sounds checked- equal and bilateral Secured at: 21 cm Tube secured with: Tape Dental Injury: Teeth and Oropharynx as per pre-operative assessment  Comments: Performed by Vallarie Mare, srna

## 2020-02-03 NOTE — H&P (Signed)
[name] Sherman is an 59 y.o. female.   Chief Complaint: Back and bilateral lower extremity pain HPI: [name] Sherman is a 59 year old individual whose had significant back and bilateral lower extremity pain.  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  She underwent surgical decompression of this about 2 years ago.  She now has progressive stenosis at L4-L5.  She has been advised regarding surgery to decompress and stabilize this level.  She has other degenerative changes at the disks above this area but no significant neural impingement.  She is now being admitted to undergo surgical decompression arthrodesis at L4-5 and L5-S1.  Past Medical History:  Diagnosis Date  . ADD (attention deficit disorder)   . Anemia   . Anxiety   . Arthritis    shoulder, right (arthritis, tendonitis, bursitis)  . Cancer ([place])    cervical cancer ([place])   . Chronic kidney disease   . Complication of anesthesia   . Depression   . Dyspnea 2020  . GERD (gastroesophageal reflux disease)   . History of cervical cancer    s/p  laser ablation of cervix 1980's  . History of kidney stones   . Hyperlipidemia   . Hypertension   . Hypothyroidism   . IBS (irritable bowel syndrome)   . Irregular heart rate    as a child, has never had any problems   . Kidney stones   . Left ureteral stone   . Migraine   . OSA (obstructive sleep apnea)    pt used cpap up until 2013 states lost wt and did not need anymore  . PONV (postoperative nausea and vomiting)   . Pre-diabetes   . Restless legs   . Sleep apnea    wears CPAP  . Umbilical hernia without obstruction or gangrene   . Urgency of urination   . Vertigo   . Vitamin D deficiency     Past Surgical History:  Procedure Laterality Date  . ABDOMINAL HYSTERECTOMY    . BACK SURGERY  04/27/2018  . BUNIONECTOMY Right 2004  .  COLONOSCOPY    . CYSTOSCOPY W/ RETROGRADES Bilateral 05/26/2015   Procedure: CYSTOSCOPY WITH RETROGRADE PYELOGRAM;  Surgeon: Festus Aloe, MD;  Location: [facility];  Service: Urology;  Laterality: Bilateral;  . CYSTOSCOPY W/ URETERAL STENT REMOVAL Left 05/26/2015   Procedure: CYSTOSCOPY WITH STENT REMOVAL;  Surgeon: Festus Aloe, MD;  Location: [facility];  Service: Urology;  Laterality: Left;  . CYSTOSCOPY WITH RETROGRADE PYELOGRAM, URETEROSCOPY AND STENT PLACEMENT Left 05/01/2015   Procedure: CYSTOSCOPY WITH RETROGRADE PYELOGRAM AND STENT PLACEMENT;  Surgeon: Festus Aloe, MD;  Location: [facility];  Service: Urology;  Laterality: Left;  . CYSTOSCOPY WITH STENT PLACEMENT Bilateral 05/26/2015   Procedure: CYSTOSCOPY WITH STENT PLACEMENT;  Surgeon: Festus Aloe, MD;  Location: [facility];  Service: Urology;  Laterality: Bilateral;  . CYSTOSCOPY WITH URETEROSCOPY Bilateral 05/26/2015   Procedure: CYSTOSCOPY WITH URETEROSCOPY;  Surgeon: Festus Aloe, MD;  Location: [facility];  Service: Urology;  Laterality: Bilateral;  . CYSTOSCOPY/URETEROSCOPY/HOLMIUM LASER/STENT PLACEMENT Right 06/09/2015   Procedure: CYSTOSCOPY/URETEROSCOPY/STENT PLACEMENT REMOVAL LEFT URETERAL STENT;  Surgeon: Festus Aloe, MD;  Location: [facility];  Service: Urology;  Laterality: Right;  . DIAGNOSTIC LAPAROSCOPY    . HERNIA REPAIR    . HOLMIUM LASER APPLICATION Left 48/18/5631   Procedure: HOLMIUM LASER APPLICATION;  Surgeon:  Festus Aloe, MD;  Location: [facility];  Service: Urology;  Laterality: Left;  . HOLMIUM LASER APPLICATION Right 01/65/5374   Procedure: HOLMIUM LASER APPLICATION;  Surgeon: Festus Aloe, MD;  Location: [facility];  Service: Urology;  Laterality: Right;  . INSERTION OF MESH N/A 09/05/2016   Procedure: INSERTION OF MESH;  Surgeon: [name], MD;  Location: [place];  Service: General;  Laterality: N/A;   . LAPAROSCOPIC ASSISTED VAGINAL HYSTERECTOMY  04-23-2002   and Left Salpingoophorectomy/  Anterior Repair/  Tension Free Tape Sling placement  . LASER ABLATION OF THE CERVIX  x2  1980's  . TUBAL LIGATION  1980's  . VENTRAL HERNIA REPAIR N/A 09/05/2016   Procedure: LAPAROSCOPIC VENTRAL HERNIA;  Surgeon: [name], MD;  Location: [place];  Service: General;  Laterality: N/A;    Family History  Problem Relation Age of Onset  . Alcohol abuse Mother   . Lung cancer Mother   . Colon cancer Mother   . Arthritis Father   . Hyperlipidemia Father   . Heart disease Father   . Hypertension Father   . Kidney disease Paternal Uncle   . Cancer Maternal Grandmother        colon  . Colon cancer Maternal Grandmother   . Rectal cancer Maternal Grandmother   . Cancer Paternal Grandmother        breast  . Esophageal cancer Neg Hx   . Stomach cancer Neg Hx    Social History:  reports that she has never smoked. She has never used smokeless tobacco. She reports that she does not drink alcohol and does not use drugs.  Allergies:  Allergies  Allergen Reactions  . Keflex [Cephalexin] Swelling    Tongue swelling    Medications Prior to Admission  Medication Sig Dispense Refill  . amphetamine-dextroamphetamine (ADDERALL) 30 MG tablet Take 1 tablet by mouth twice daily.  May refill in 2 months. 60 tablet 0  . atorvastatin (LIPITOR) 10 MG tablet TAKE 1 TABLET BY MOUTH DAILY (Patient taking differently: Take 10 mg by mouth daily. ) 90 tablet 2  . Biotin 1000 MCG tablet Take 1,000 mcg by mouth 3 (three) times daily.    . citalopram (CELEXA) 20 MG tablet Take 20 mg by mouth daily.    . furosemide (LASIX) 20 MG tablet TAKE 1 TABLET(20 MG) BY MOUTH DAILY AS NEEDED FOR SWELLING (Patient taking differently: Take 20 mg by mouth daily as needed for edema. ) 30 tablet 1  . levothyroxine (SYNTHROID) 50 MCG tablet TAKE 1 TABLET(50 MCG) BY MOUTH DAILY BEFORE BREAKFAST (Patient taking differently: Take 50 mcg by  mouth daily before breakfast. ) 90 tablet 0  . losartan-hydrochlorothiazide (HYZAAR) 50-12.5 MG tablet Take 1 tablet by mouth daily. 90 tablet 3  . ondansetron (ZOFRAN-ODT) 8 MG disintegrating tablet Take 1 tablet (8 mg total) by mouth every 8 (eight) hours as needed for nausea or vomiting. 60 tablet 3  . oxyCODONE-acetaminophen (PERCOCET/ROXICET) 5-325 MG tablet Take 1 tablet by mouth every 8 (eight) hours as needed.    . pantoprazole (PROTONIX) 40 MG tablet Take 1 tablet (40 mg total) by mouth 2 (two) times daily. 60 tablet 3  . pramipexole (MIRAPEX) 0.125 MG tablet TAKE 1-2 TABLET EVERY EVENING AS NEEDED FOR RESTLESS LEG SYNDROME (Patient taking differently: Take 0.125 mg by mouth daily as needed (RLS). TAKE 1 TABLET EVERY EVENING AS NEEDED FOR RESTLESS LEG SYNDROME) 60 tablet 3  . Semaglutide,0.25 or 0.5MG /DOS, (OZEMPIC, 0.25 OR 0.5 MG/DOSE,)  2 MG/1.5ML SOPN Inject 0.375 mLs (0.5 mg total) into the skin once a week. 3 pen 3  . SUMAtriptan (IMITREX) 100 MG tablet May repeat in 2 hours if headache persists or recurs.  Do not take more than 2 in 24 hours (Patient taking differently: Take 100 mg by mouth every 2 (two) hours as needed for migraine. May repeat in 2 hours if headache persists or recurs.  Do not take more than 2 in 24 hours) 10 tablet 11  . Vitamin D, Ergocalciferol, (DRISDOL) 1.25 MG (50000 UNIT) CAPS capsule TAKE 1 CAPSULE BY MOUTH 1 TIME A WEEK (Patient taking differently: Take 50,000 Units by mouth every Monday. TAKE 1 CAPSULE BY MOUTH 1 TIME A WEEK) 12 capsule 3  . amphetamine-dextroamphetamine (ADDERALL) 30 MG tablet Take one tablet by mouth twice daily.  May refill in one month. (Patient not taking: Reported on 01/28/2020) 60 tablet 0  . amphetamine-dextroamphetamine (ADDERALL) 30 MG tablet Take one tablet twice daily. (Patient not taking: Reported on 01/28/2020) 60 tablet 0  . gabapentin (NEURONTIN) 300 MG capsule Take 1 capsule (300 mg total) by mouth 3 (three) times daily. (Patient not  taking: Reported on 01/28/2020) 100 capsule 3    No results found for this or any previous visit (from the past 48 hour(s)). No results found.  Review of Systems  Constitutional: Positive for activity change.  HENT: Negative.   Eyes: Negative.   Respiratory: Negative.   Cardiovascular: Negative.   Gastrointestinal: Negative.   Endocrine: Negative.   Genitourinary: Negative.   Musculoskeletal: Positive for back pain and myalgias.  Allergic/Immunologic: Negative.   Neurological: Positive for weakness.  Psychiatric/Behavioral: Negative.     Blood pressure 129/73, pulse 68, temperature 98.1 F (36.7 C), resp. rate 20, height 5\' 4"  (1.626 m), weight 112.1 kg, SpO2 98 %. Physical Exam Vitals reviewed.  Constitutional:      Appearance: She is obese.  HENT:     Head: Normocephalic and atraumatic.     Right Ear: Tympanic membrane normal.     Left Ear: Tympanic membrane normal.     Nose: Nose normal.     Mouth/Throat:     Mouth: Mucous membranes are moist.  Eyes:     Extraocular Movements: Extraocular movements intact.     Pupils: Pupils are equal, round, and reactive to light.  Cardiovascular:     Rate and Rhythm: Normal rate and regular rhythm.     Pulses: Normal pulses.     Heart sounds: Normal heart sounds.  Pulmonary:     Effort: Pulmonary effort is normal.     Breath sounds: Normal breath sounds.  Abdominal:     General: Abdomen is flat.     Palpations: Abdomen is soft.  Musculoskeletal:     Cervical back: Normal range of motion and neck supple.     Comments: Positive straight leg raising bilaterally at 30 degrees Patrick's maneuver is negative.  Palpation and percussion reproduce localized tenderness at the lumbosacral junction.  Absent deep tendon reflexes in the patellae and the Achilles both.  Upper extremity reflexes intact.  Skin:    General: Skin is warm and dry.     Capillary Refill: Capillary refill takes less than 2 seconds.  Neurological:     General: No  focal deficit present.     Mental Status: She is alert.  Psychiatric:        Mood and Affect: Mood normal.        Behavior: Behavior normal.  Thought Content: Thought content normal.        Judgment: Judgment normal.      Assessment/Plan Lumbar stenosis with radiculopathy L4-5.  History of fusion L5-S1.  Degenerative scoliosis.  Plan: Decompression and posterior interbody arthrodesis L4-L5.  Earleen Newport, MD 02/03/2020, 7:46 AM

## 2020-02-03 NOTE — Evaluation (Signed)
Physical Therapy Evaluation Patient Details Name: [name] Sherman MRN: [id number] DOB: Nov 21, 1960 Today's Date: 02/03/2020   History of Present Illness  Pt is a 59yo female admitted for elective surgical decompression arthrodesis at L4-5 and L5-S1. PMH: ADD, anxiety, arthritis, GERD,d epression, PONV, vertigo, IBS, HTN, OSA PMH: previous back surgery, hysterectomy, hernia repair  Clinical Impression  Patient is s/p above surgery resulting in the deficits listed below (see PT Problem List). Pt with 10/10 burning pain at incision and down R LE. Patient will benefit from skilled PT to increase their independence and safety with mobility (while adhering to their precautions) to allow discharge to the venue listed below.     Follow Up Recommendations Home health PT;Supervision/Assistance - 24 hour (may not need it)    Equipment Recommendations  None recommended by PT    Recommendations for Other Services       Precautions / Restrictions Precautions Precautions: Back Precaution Booklet Issued: Yes (comment) Precaution Comments: spouse with good understanding, pt able to recall at end of session Restrictions Weight Bearing Restrictions: No      Mobility  Bed Mobility Overal bed mobility: Needs Assistance Bed Mobility: Rolling;Sidelying to Sit;Sit to Sidelying Rolling: Supervision Sidelying to sit: Min guard     Sit to sidelying: Min guard General bed mobility comments: max directional verbal cues for log rolling, increased time  Transfers Overall transfer level: Needs assistance Equipment used: Rolling walker (2 wheeled) Transfers: Sit to/from Stand Sit to Stand: Min guard         General transfer comment: increased time, verbal cues for hand placement  Ambulation/Gait Ambulation/Gait assistance: Min guard Gait Distance (Feet): 40 Feet Assistive device: Rolling walker (2 wheeled) Gait Pattern/deviations: Step-through pattern;Decreased stride length Gait velocity:  slow Gait velocity interpretation: <1.8 ft/sec, indicate of risk for recurrent falls General Gait Details: pt with c/o "its burning so bad', during last 20 feet pt with R knee instability/buckling, pt able to manage without physical assist. pt reports "my legs will give out on me"  Stairs            Wheelchair Mobility    Modified Rankin (Stroke Patients Only)       Balance Overall balance assessment: Needs assistance Sitting-balance support: Feet supported;No upper extremity supported Sitting balance-Leahy Scale: Fair     Standing balance support: Bilateral upper extremity supported Standing balance-Leahy Scale: Good Standing balance comment: dependent on RW                             Pertinent Vitals/Pain Pain Assessment: 0-10 Pain Score: 10-Worst pain ever Pain Location: incision and R LE Pain Descriptors / Indicators: Burning;Radiating;Sharp;Shooting Pain Intervention(s): Premedicated before session    [place] expects to be discharged to:: Private residence Living Arrangements: Spouse/significant other;Parent Available Help at Discharge: Family;Available 24 hours/day Type of Home: House Home Access: Stairs to enter Entrance Stairs-Rails: None Entrance Stairs-Number of Steps: 2 Home Layout: One level Home Equipment: Walker - 2 wheels;Walker - 4 wheels;Cane - single point;Shower seat;Bedside commode      Prior Function Level of Independence: Needs assistance   Gait / Transfers Assistance Needed: would use rollator for long distance ambulation  ADL's / Homemaking Assistance Needed: spouse assists        Hand Dominance   Dominant Hand: Right    Extremity/Trunk Assessment   Upper Extremity Assessment Upper Extremity Assessment: Overall WFL for tasks assessed    Lower Extremity Assessment Lower Extremity  Assessment: Overall WFL for tasks assessed (however R LE was buckling during amb)    Cervical / Trunk  Assessment Cervical / Trunk Assessment: Other exceptions Cervical / Trunk Exceptions: recent back surgery  Communication   Communication: No difficulties  Cognition Arousal/Alertness: Awake/alert Behavior During Therapy: WFL for tasks assessed/performed Overall Cognitive Status: Within Functional Limits for tasks assessed                                 General Comments: slow moving      General Comments General comments (skin integrity, edema, etc.): incision covered by dressing    Exercises     Assessment/Plan    PT Assessment Patient needs continued PT services  PT Problem List Decreased strength;Decreased activity tolerance;Decreased balance;Decreased mobility;Decreased knowledge of use of DME       PT Treatment Interventions DME instruction;Gait training;Stair training;Functional mobility training;Therapeutic activities;Therapeutic exercise;Balance training    PT Goals (Current goals can be found in the Care Plan section)  Acute Rehab PT Goals Patient Stated Goal: stop the burning PT Goal Formulation: With patient Time For Goal Achievement: 02/17/20 Potential to Achieve Goals: Good    Frequency Min 5X/week   Barriers to discharge        Co-evaluation               AM-PAC PT "6 Clicks" Mobility  Outcome Measure Help needed turning from your back to your side while in a flat bed without using bedrails?: A Little Help needed moving from lying on your back to sitting on the side of a flat bed without using bedrails?: A Little Help needed moving to and from a bed to a chair (including a wheelchair)?: A Little Help needed standing up from a chair using your arms (e.g., wheelchair or bedside chair)?: A Little Help needed to walk in hospital room?: A Little Help needed climbing 3-5 steps with a railing? : A Little 6 Click Score: 18    End of Session Equipment Utilized During Treatment: Gait belt;Back brace Activity Tolerance: Patient tolerated  treatment well Patient left: in bed;with call bell/phone within reach;with family/visitor present Nurse Communication: Mobility status PT Visit Diagnosis: Unsteadiness on feet (R26.81);Muscle weakness (generalized) (M62.81);Difficulty in walking, not elsewhere classified (R26.2)    Time: 8676-7209 PT Time Calculation (min) (ACUTE ONLY): 31 min   Charges:   PT Evaluation $PT Eval Moderate Complexity: 1 Mod PT Treatments $Gait Training: 8-22 mins        Kittie Plater, PT, DPT Acute Rehabilitation Services Pager #: [phone] Office #: [phone]   Berline Lopes 02/03/2020, 2:47 PM

## 2020-02-03 NOTE — Op Note (Signed)
Date of surgery: 02/03/2020 preoperative diagnosis: Lumbar spinal stenosis L4-L5 with lumbar radiculopathy postoperative diagnosis: Same procedure: Lumbar laminectomy L4-L5, decompression of the L4 and L5 nerve roots with more work than simple interbody fusion.  Posterior lumbar interbody decompression arthrodesis with local autograft and allograft, peek spacers.  Pedicle fixation L4 to the sacrum. Surgeon: Kristeen Miss first Assistant: Earle Gell, MD anesthesia: General endotracheal indications: [name] Sherman is a 59 year old individual whose had significant back and bilateral lower extremity pain she has evidence of significant stenosis at L4-L5 above her previous L5-S1 decompression and fusion.  [name] Sherman also has some early degenerative scoliosis that is forming.  She was advised regarding the need for surgery to decompress and stabilize the L4-L5 level.  Procedure: The patient was brought to the operating room supine on the stretcher.  After the smooth induction of general endotracheal anesthesia, she was carefully turned prone.  The back was prepped with alcohol DuraPrep and draped in a sterile fashion.  The previously opened midline incision was reopened and dissection was carried down to the lumbodorsal fascia.  Dissection was carried subperiosteally to expose the region of the previous surgery including the hardware at L5 and S1.  Then by carefully dissecting superiorly the L4-L5 interspace was isolated.  Transverse process of L4 was isolated and packed off as was the facet joint at the L4-L5 space.  Once this was all isolated the procedure was continued by creating laminotomy removing the inferior margin lamina of L4 out to and including the entirety of the facet.  This bone was harvested for use in autograft.  Next the laminotomies were enlarged to allow for decompression of the common dural tube the path of the L4 nerve root superiorly and the L5 nerve root inferiorly.  On the left side there was  particularly severe stenosis for the common dural tube and the L5 nerve root.  This was carefully decompressed and no CSF leaks were encountered.  The disc space was isolated.  Then the disc base was incised with a #15 blade and a combination of curettes and rongeurs was used to evacuate the disc over a substantial amount of severely degenerated desiccated disc material the endplates were rongeured and curettaged to remove any remnants of articular cartilage.  Once this was all removed the interspace was sized for an appropriately sized spacer and it was felt that ultimately a 10 x 9 x 23 mm spacer with 8 degrees of lordosis would fit best to these were packed with autograft allograft and allograft which was morselized cortical cancellous chips of bone.  A total of 9 cc of bone graft was then packed into the disc space.  The 2 cages were placed into the interspace.  Once this was completed the lateral gutters which had previously been packed off were packed with an additional 9 cc of bone graft into each lateral gutter.  And using fluoroscopic guidance pedicle screws were placed into L4 these were 6.5 x 45 mm pedicle screws the previous construct was opened by removing the previously placed rod.  260 mm precontoured rods were then placed between the screw heads of L4-L5 and the sacrum.  These were tightened in a neutral construct and final radiographs identified good alignment of the vertebrae.  Hemostasis was checked carefully and the wound and then the lumbodorsal fascia was closed with #1 Vicryl in interrupted fashion 2-0 Vicryl was used in the subcutaneous tissues and 3-0 Vicryl subcuticularly 4-0 Vicryl and the final subcuticular closure was also used Dermabond  was placed on the skin as was a honeycomb dressing.  Patient tolerated procedure well blood loss was noted to be 600 cc.

## 2020-02-03 NOTE — Transfer of Care (Signed)
Immediate Anesthesia Transfer of Care Note  Patient: [name] Sherman  Procedure(s) Performed: Lumbar Four-Five Posterior lumbar interbody fusion with pedicle screw fixation (N/A Back)  Patient Location: PACU  Anesthesia Type:General  Level of Consciousness: drowsy and patient cooperative  Airway & Oxygen Therapy: Patient Spontanous Breathing and Patient connected to face mask  Post-op Assessment: Report given to RN, Post -op Vital signs reviewed and stable and Patient moving all extremities  Post vital signs: Reviewed and stable  Last Vitals:  Vitals Value Taken Time  BP 115/67 02/03/20 1157  Temp    Pulse 83 02/03/20 1158  Resp 30 02/03/20 1158  SpO2 96 % 02/03/20 1158  Vitals shown include unvalidated device data.  Last Pain:  Vitals:   02/03/20 0651  PainSc: 7       Patients Stated Pain Goal: 3 (80/32/12 2482)  Complications: No complications documented.

## 2020-02-03 NOTE — Anesthesia Postprocedure Evaluation (Signed)
Anesthesia Post Note  Patient: [name] Sherman  Procedure(s) Performed: Lumbar Four-Five Posterior lumbar interbody fusion with pedicle screw fixation (N/A Back)     Patient location during evaluation: PACU Anesthesia Type: General Level of consciousness: awake and alert Pain management: pain level controlled Vital Signs Assessment: post-procedure vital signs reviewed and stable Respiratory status: spontaneous breathing, nonlabored ventilation, respiratory function stable and patient connected to nasal cannula oxygen Cardiovascular status: blood pressure returned to baseline and stable Postop Assessment: no apparent nausea or vomiting Anesthetic complications: no   No complications documented.  Last Vitals:  Vitals:   02/03/20 1309 02/03/20 1634  BP: 104/65 (!) 94/58  Pulse: 71 66  Resp: 18 18  Temp: 36.6 C 36.6 C  SpO2: 97% 91%    Last Pain:  Vitals:   02/03/20 1458  TempSrc:   PainSc: 4                  Catalina Gravel

## 2020-02-04 LAB — CBC
HCT: 29.5 % — ABNORMAL LOW (ref 36.0–46.0)
Hemoglobin: 9.2 g/dL — ABNORMAL LOW (ref 12.0–15.0)
MCH: 26 pg (ref 26.0–34.0)
MCHC: 31.2 g/dL (ref 30.0–36.0)
MCV: 83.3 fL (ref 80.0–100.0)
Platelets: 360 10*3/uL (ref 150–400)
RBC: 3.54 MIL/uL — ABNORMAL LOW (ref 3.87–5.11)
RDW: 14.6 % (ref 11.5–15.5)
WBC: 18.3 10*3/uL — ABNORMAL HIGH (ref 4.0–10.5)
nRBC: 0 % (ref 0.0–0.2)

## 2020-02-04 LAB — BASIC METABOLIC PANEL
Anion gap: 7 (ref 5–15)
BUN: 17 mg/dL (ref 6–20)
CO2: 30 mmol/L (ref 22–32)
Calcium: 8.6 mg/dL — ABNORMAL LOW (ref 8.9–10.3)
Chloride: 104 mmol/L (ref 98–111)
Creatinine, Ser: 0.98 mg/dL (ref 0.44–1.00)
GFR calc Af Amer: >60 mL/min (ref >60)
GFR calc non Af Amer: >60 mL/min (ref >60)
Glucose, Bld: 93 mg/dL (ref 70–99)
Potassium: 4.4 mmol/L (ref 3.5–5.1)
Sodium: 141 mmol/L (ref 135–145)

## 2020-02-04 MED ORDER — LOPERAMIDE HCL 2 MG PO CAPS
2.0000 mg | ORAL_CAPSULE | ORAL | Status: DC | PRN
  Administered 2020-02-04 (×2): 4 mg via ORAL
  Filled 2020-02-04 (×4): qty 2

## 2020-02-04 MED FILL — Thrombin For Soln 5000 Unit: CUTANEOUS | Qty: 5000 | Status: AC

## 2020-02-04 NOTE — Progress Notes (Signed)
Physical Therapy Treatment Patient Details Name: [name] Sherman MRN: [id number] DOB: 06/17/1961 Today's Date: 02/04/2020    History of Present Illness Pt is a 59yo female admitted for elective surgical decompression arthrodesis at L4-5 and L5-S1. PMH: ADD, anxiety, arthritis, GERD,d epression, PONV, vertigo, IBS, HTN, OSA PMH: previous back surgery, hysterectomy, hernia repair    PT Comments    Pt progressing towards physical therapy goals. Was able to perform transfers and ambulation with gross supervision for safety and RW for support. Feel that pt is likely near ready to transition to [facility] or no AD but feels more comfortable at this time with RW as she has a fear of falling. Reinforced education regarding back precautions, appropriate activity progression, car transfer, and brace application/wearing schedule. Will continue to follow and progress as able per POC.    Follow Up Recommendations  No PT follow up;Supervision - Intermittent     Equipment Recommendations  None recommended by PT    Recommendations for Other Services       Precautions / Restrictions Precautions Precautions: Back Precaution Booklet Issued: Yes (comment) Precaution Comments: Verbally reviewed precautions during functional mobility.  Restrictions Weight Bearing Restrictions: No    Mobility  Bed Mobility Overal bed mobility: Needs Assistance Bed Mobility: Rolling;Sidelying to Sit;Sit to Sidelying Rolling: Modified independent (Device/Increase time) Sidelying to sit: Supervision     Sit to sidelying: Supervision General bed mobility comments: Able to complete without assist and no use of rails however pt requires light cues for proper log roll. Technique started to falter when pt rushing to get up to the bathroom due to diarrhea.   Transfers Overall transfer level: Modified independent Equipment used: Rolling walker (2 wheeled) Transfers: Sit to/from Stand           General transfer comment: Pt  demonstrated proper hand placement on seated surface for safety.   Ambulation/Gait Ambulation/Gait assistance: Supervision Gait Distance (Feet): 250 Feet Assistive device: Rolling walker (2 wheeled) Gait Pattern/deviations: Step-through pattern;Decreased stride length Gait velocity: Decreased Gait velocity interpretation: <1.31 ft/sec, indicative of household ambulator General Gait Details: Pt with good posture and forward gaze during gait training. Noted increased UE support on the walker - pt reports she is scared she will fall. No unsteadiness or overt LOB noted.    Stairs             Wheelchair Mobility    Modified Rankin (Stroke Patients Only)       Balance Overall balance assessment: Needs assistance Sitting-balance support: Feet supported;No upper extremity supported Sitting balance-Leahy Scale: Fair     Standing balance support: Bilateral upper extremity supported Standing balance-Leahy Scale: Good Standing balance comment: dependent on RW                            Cognition Arousal/Alertness: Awake/alert Behavior During Therapy: WFL for tasks assessed/performed Overall Cognitive Status: Within Functional Limits for tasks assessed                                        Exercises      General Comments        Pertinent Vitals/Pain Pain Assessment: Faces Faces Pain Scale: Hurts little more Pain Location: Incision site Pain Descriptors / Indicators: Burning;Radiating;Sharp;Shooting Pain Intervention(s): Limited activity within patient's tolerance;Monitored during session;Repositioned    Home Living  Prior Function            PT Goals (current goals can now be found in the care plan section) Acute Rehab PT Goals Patient Stated Goal: Pain control; stop diarrhea PT Goal Formulation: With patient Time For Goal Achievement: 02/17/20 Potential to Achieve Goals: Good Progress towards PT goals:  Progressing toward goals    Frequency    Min 5X/week      PT Plan Discharge plan needs to be updated    Co-evaluation              AM-PAC PT "6 Clicks" Mobility   Outcome Measure  Help needed turning from your back to your side while in a flat bed without using bedrails?: None Help needed moving from lying on your back to sitting on the side of a flat bed without using bedrails?: None Help needed moving to and from a bed to a chair (including a wheelchair)?: None Help needed standing up from a chair using your arms (e.g., wheelchair or bedside chair)?: None Help needed to walk in hospital room?: None Help needed climbing 3-5 steps with a railing? : A Little 6 Click Score: 23    End of Session Equipment Utilized During Treatment: Gait belt;Back brace Activity Tolerance: Patient tolerated treatment well Patient left: in bed;with call bell/phone within reach;with family/visitor present Nurse Communication: Mobility status PT Visit Diagnosis: Unsteadiness on feet (R26.81);Muscle weakness (generalized) (M62.81);Difficulty in walking, not elsewhere classified (R26.2)     Time: 1572-6203 PT Time Calculation (min) (ACUTE ONLY): 18 min  Charges:  $Gait Training: 8-22 mins                     Rolinda Roan, PT, DPT Acute Rehabilitation Services Pager: [phone] Office: [phone]    Thelma Comp 02/04/2020, 10:29 AM

## 2020-02-04 NOTE — Progress Notes (Signed)
Patient ID: [name] Sherman, female   DOB: June 24, 1961, 59 y.o.   MRN: [id number] Vital signs are stable Motor function appears to be doing quite well Postoperative labs are acceptable with hemoglobin at 9.8 Electrolytes are normal Patient wishes to stay today to improve her ambulatory status We will see how she does today plan discharge for tomorrow.

## 2020-02-04 NOTE — Evaluation (Signed)
Occupational Therapy Evaluation Patient Details Name: [name] Sherman MRN: [id number] DOB: 1960-11-23 Today's Date: 02/04/2020    History of Present Illness Pt is a 59yo female admitted for elective surgical decompression arthrodesis at L4-5 and L5-S1. PMH: ADD, anxiety, arthritis, GERD,d epression, PONV, vertigo, IBS, HTN, OSA PMH: previous back surgery, hysterectomy, hernia repair   Clinical Impression   Patient evaluated by Occupational Therapy with no further acute OT needs identified. All education has been completed and the patient has no further questions. See below for any follow-up Occupational Therapy or equipment needs. OT to sign off. Thank you for referral.      Follow Up Recommendations  No OT follow up    Equipment Recommendations  None recommended by OT    Recommendations for Other Services       Precautions / Restrictions Precautions Precautions: Back Precaution Booklet Issued: Yes (comment) Precaution Comments: able to verbalize precautions from precautions  Restrictions Weight Bearing Restrictions: No      Mobility Bed Mobility Overal bed mobility: Modified Independent Bed Mobility: Rolling;Sidelying to Sit;Sit to Sidelying Rolling: Modified independent (Device/Increase time) Sidelying to sit: Supervision     Sit to sidelying: Supervision General bed mobility comments: Able to complete without assist and no use of rails however pt requires light cues for proper log roll. Technique started to falter when pt rushing to get up to the bathroom due to diarrhea.   Transfers Overall transfer level: Modified independent Equipment used: Rolling walker (2 wheeled) Transfers: Sit to/from Stand           General transfer comment: Pt demonstrated proper hand placement on seated surface for safety.     Balance Overall balance assessment: Needs assistance Sitting-balance support: Feet supported;No upper extremity supported Sitting balance-Leahy Scale:  Fair     Standing balance support: Bilateral upper extremity supported Standing balance-Leahy Scale: Good Standing balance comment: dependent on RW                           ADL either performed or assessed with clinical judgement   ADL Overall ADL's : Needs assistance/impaired                   Upper Body Dressing Details (indicate cue type and reason): able to don doff brace Indep   Lower Body Dressing Details (indicate cue type and reason): requires AE            Tub/Shower Transfer Details (indicate cue type and reason): pt able to verbalized transfer and steps in with strongest leg first. pt steps in tub then sits on seat and brings opposite leg over tub surface.    General ADL Comments: pt with previous back surgery and able to verbalize use of AE for LB. spouse present and confirms helping with all bathing task to decrease fall risk.  Back handout provided and reviewed adls in detail. Pt educated on: clothing between brace, never sleep in brace, set an alarm at night for medication, avoid sitting for long periods of time, correct bed positioning for sleeping, correct sequence for bed mobility, avoiding lifting more than 5 pounds and never wash directly over incision. Pt encouraged to walk 4 times in a day prior to each meal to help with overall recovery. Pt has a small dog in home that family can (A) with care. All education is complete and patient indicates understanding.       Vision   Vision Assessment?: No apparent visual  deficits     Perception     Praxis      Pertinent Vitals/Pain Pain Assessment: No/denies pain Faces Pain Scale: Hurts little more Pain Location: Incision site Pain Descriptors / Indicators: Burning;Radiating;Sharp;Shooting Pain Intervention(s): Limited activity within patient's tolerance;Monitored during session;Repositioned     Hand Dominance Right   Extremity/Trunk Assessment Upper Extremity Assessment Upper Extremity  Assessment: Overall WFL for tasks assessed   Lower Extremity Assessment Lower Extremity Assessment: Defer to PT evaluation   Cervical / Trunk Assessment Cervical / Trunk Assessment: Other exceptions Cervical / Trunk Exceptions: recent back surgery   Communication Communication Communication: No difficulties   Cognition Arousal/Alertness: Awake/alert Behavior During Therapy: WFL for tasks assessed/performed Overall Cognitive Status: Within Functional Limits for tasks assessed                                     General Comments  back education    Exercises     Shoulder Instructions      Home Living Family/patient expects to be discharged to:: Private residence Living Arrangements: Spouse/significant other;Parent Available Help at Discharge: Family;Available 24 hours/day Type of Home: House Home Access: Stairs to enter [employer] of Steps: 2 Entrance Stairs-Rails: None Home Layout: One level     Bathroom Shower/Tub: Teacher, early years/pre: Standard     Home Equipment: Environmental consultant - 2 wheels;Walker - 4 wheels;Cane - single point;Shower seat;Bedside commode;Adaptive equipment;Hand held shower head Adaptive Equipment: Reacher;Sock aid;Other (Comment) (toilet aide)        Prior Functioning/Environment Level of Independence: Needs assistance  Gait / Transfers Assistance Needed: would use rollator for long distance ambulation ADL's / Homemaking Assistance Needed: spouse assists            OT Problem List:        OT Treatment/Interventions:      OT Goals(Current goals can be found in the care plan section) Acute Rehab OT Goals Patient Stated Goal: stop diarrhea OT Goal Formulation: With patient/family  OT Frequency:     Barriers to D/C:            Co-evaluation              AM-PAC OT "6 Clicks" Daily Activity     Outcome Measure Help from another person eating meals?: None Help from another person taking care of  personal grooming?: None Help from another person toileting, which includes using toliet, bedpan, or urinal?: None Help from another person bathing (including washing, rinsing, drying)?: None Help from another person to put on and taking off regular upper body clothing?: None Help from another person to put on and taking off regular lower body clothing?: A Little 6 Click Score: 23   End of Session Nurse Communication: Mobility status;Precautions  Activity Tolerance: Patient tolerated treatment well Patient left: in bed;with call bell/phone within reach;with family/visitor present  OT Visit Diagnosis: Unsteadiness on feet (R26.81)                Time: 8850-2774 OT Time Calculation (min): 19 min Charges:  OT General Charges $OT Visit: 1 Visit OT Evaluation $OT Eval Low Complexity: 1 Low   Brynn, OTR/L  Acute Rehabilitation Services Pager: [phone] Office: [phone] .   Jeri Modena 02/04/2020, 11:04 AM

## 2020-02-05 MED ORDER — OXYCODONE-ACETAMINOPHEN 5-325 MG PO TABS: 1.0000 | ORAL_TABLET | ORAL | 0 refills | Status: DC | PRN

## 2020-02-05 MED ORDER — METHOCARBAMOL 500 MG PO TABS: 500.0000 mg | ORAL_TABLET | Freq: Four times a day (QID) | ORAL | 3 refills | Status: DC | PRN

## 2020-02-05 NOTE — Progress Notes (Signed)
Patient ID: [name] Sherman, female   DOB: [birth year], 59 y.o.   MRN: [id number] Vital signs are stable Incision is clean and dry Patient has had diarrhea She is ambulating however and is ready for discharge We will write Rx for pain meds as she is out at home Discharge today

## 2020-02-05 NOTE — Discharge Instructions (Signed)
Wound Care Remove outer dressing tomorrow Leave incision open to air. You may shower. Do not scrub directly on incision.  Do not put any creams, lotions, or ointments on incision. Activity Walk each and every day, increasing distance each day. No lifting greater than 5 lbs.  Avoid bending, lifting, and twisting. No driving for 2 weeks; may ride as a passenger locally. If provided with back brace, wear when out of bed.  It is not necessary to wear in bed. Diet Resume your normal diet.  Return to Work Will be discussed at you follow up appointment. Call Your Doctor If Any of These Occur Redness, drainage, or swelling at the wound.  Temperature greater than 101 degrees. Severe pain not relieved by pain medication. Incision starts to come apart. Follow Up Appt Call today for appointment in 2 weeks ([phone]) or for problems.  If you have any hardware placed in your spine, you will need an x-ray before your appointment.

## 2020-02-05 NOTE — Discharge Summary (Signed)
Physician Discharge Summary  Patient ID: [name] Sherman MRN: [id number] DOB/AGE: 01/27/1961 59 y.o.  Admit date: 02/03/2020 Discharge date: 02/05/2020  Admission Diagnoses: Lumbar stenosis L4-L5.  Lumbar radiculopathy.  History of fusion L5-S1.  Morbid obesity.  Discharge Diagnoses: Lumbar stenosis L4-L5.  Lumbar radiculopathy.  History of fusion L5-S1.  Morbid obesity.  Postoperative diarrhea Active Problems:   Lumbar stenosis with neurogenic claudication   Discharged Condition: Good   Hospital Course: Patient was admitted to undergo surgical decompression arthrodesis.  Postoperative course was complicated by diarrhea but otherwise the patient had good relief of pain and was ambulatory.  She is discharged home with some pain medication.  Consults: None  Significant Diagnostic Studies: None  Treatments: surgery: Decompression and fusion L4-L5  Discharge Exam: Blood pressure 111/68, pulse 89, temperature 98.3 F (36.8 C), temperature source Oral, resp. rate 18, height 5\' 4"  (1.626 m), weight 112.1 kg, SpO2 95 %. Incision is clean and dry motor function is intact in the lower extremities.  Disposition: Discharge disposition: 01-Home or Self Care       Discharge Instructions    Call MD for:  redness, tenderness, or signs of infection (pain, swelling, redness, odor or green/yellow discharge around incision site)   Complete by: As directed    Call MD for:  severe uncontrolled pain   Complete by: As directed    Call MD for:  temperature >100.4   Complete by: As directed    Diet - low sodium heart healthy   Complete by: As directed    Incentive spirometry RT   Complete by: As directed    Increase activity slowly   Complete by: As directed    No dressing needed   Complete by: As directed      Allergies as of 02/05/2020      Reactions   Keflex [cephalexin] Swelling   Tongue swelling      Medication List    TAKE these medications   amphetamine-dextroamphetamine 30 MG  tablet Commonly known as: Adderall Take one tablet by mouth twice daily.  May refill in one month.   amphetamine-dextroamphetamine 30 MG tablet Commonly known as: Adderall Take one tablet twice daily.   amphetamine-dextroamphetamine 30 MG tablet Commonly known as: Adderall Take 1 tablet by mouth twice daily.  May refill in 2 months.   atorvastatin 10 MG tablet Commonly known as: LIPITOR TAKE 1 TABLET BY MOUTH DAILY   Biotin 1000 MCG tablet Take 1,000 mcg by mouth 3 (three) times daily.   citalopram 20 MG tablet Commonly known as: CELEXA Take 20 mg by mouth daily.   furosemide 20 MG tablet Commonly known as: LASIX TAKE 1 TABLET(20 MG) BY MOUTH DAILY AS NEEDED FOR SWELLING What changed: See the new instructions.   gabapentin 300 MG capsule Commonly known as: NEURONTIN Take 1 capsule (300 mg total) by mouth 3 (three) times daily.   levothyroxine 50 MCG tablet Commonly known as: SYNTHROID TAKE 1 TABLET(50 MCG) BY MOUTH DAILY BEFORE BREAKFAST What changed: See the new instructions.   losartan-hydrochlorothiazide 50-12.5 MG tablet Commonly known as: HYZAAR Take 1 tablet by mouth daily.   methocarbamol 500 MG tablet Commonly known as: ROBAXIN Take 1 tablet (500 mg total) by mouth every 6 (six) hours as needed for muscle spasms.   ondansetron 8 MG disintegrating tablet Commonly known as: ZOFRAN-ODT Take 1 tablet (8 mg total) by mouth every 8 (eight) hours as needed for nausea or vomiting.   oxyCODONE-acetaminophen 5-325 MG tablet Commonly known as: PERCOCET/ROXICET  Take 1-2 tablets by mouth every 4 (four) hours as needed for moderate pain or severe pain. What changed:   how much to take  when to take this  reasons to take this   Ozempic (0.25 or 0.5 MG/DOSE) 2 MG/1.5ML Sopn Generic drug: Semaglutide(0.25 or 0.5MG /DOS) Inject 0.375 mLs (0.5 mg total) into the skin once a week.   pantoprazole 40 MG tablet Commonly known as: PROTONIX Take 1 tablet (40 mg total)  by mouth 2 (two) times daily.   pramipexole 0.125 MG tablet Commonly known as: MIRAPEX TAKE 1-2 TABLET EVERY EVENING AS NEEDED FOR RESTLESS LEG SYNDROME What changed: See the new instructions.   SUMAtriptan 100 MG tablet Commonly known as: IMITREX May repeat in 2 hours if headache persists or recurs.  Do not take more than 2 in 24 hours What changed:   how much to take  how to take this  when to take this  reasons to take this   Vitamin D (Ergocalciferol) 1.25 MG (50000 UNIT) Caps capsule Commonly known as: DRISDOL TAKE 1 CAPSULE BY MOUTH 1 TIME A WEEK What changed: See the new instructions.            Discharge Care Instructions  (From admission, onward)         Start     Ordered   02/05/20 0000  No dressing needed        02/05/20 5732           Signed: Earleen Newport 02/05/2020, 8:52 AM

## 2020-02-05 NOTE — Plan of Care (Signed)
Patient alert and oriented, mae's well, voiding adequate amount of urine, swallowing without difficulty, no c/o pain at time of discharge. Patient discharged home with family. Script and discharged instructions given to patient. Patient and family stated understanding of instructions given. Patient has an appointment with Dr. Elsner  

## 2020-02-05 NOTE — Progress Notes (Signed)
Physical Therapy Treatment Patient Details Name: [name] Sherman MRN: [id number] DOB: [birth year] Today's Date: 02/05/2020    History of Present Illness Pt is a 59yo female admitted for elective surgical decompression arthrodesis at L4-5 and L5-S1. PMH: ADD, anxiety, arthritis, GERD,d epression, PONV, vertigo, IBS, HTN, OSA PMH: previous back surgery, hysterectomy, hernia repair    PT Comments    Pt progressing towards independence with RW. Continues to report pain but is functioning at supervision level. Spouse to be present 24/7 to assist and pt with all DME needed.  Pt with no further acute needs at this time. PT SIGNING OFF.   Follow Up Recommendations  No PT follow up;Supervision - Intermittent     Equipment Recommendations  None recommended by PT    Recommendations for Other Services       Precautions / Restrictions Precautions Precautions: Back Precaution Booklet Issued: Yes (comment) Precaution Comments: able to verbalize but non-compliant functionally Restrictions Weight Bearing Restrictions: No    Mobility  Bed Mobility Overal bed mobility: Modified Independent                Transfers Overall transfer level: Modified independent Equipment used: Rolling walker (2 wheeled) Transfers: Sit to/from Stand Sit to Stand: Min guard         General transfer comment: minimal trunk flexion, wide base of support, slides right to edge of bed despite verbal cues not too  Ambulation/Gait Ambulation/Gait assistance: Supervision Gait Distance (Feet): 300 Feet Assistive device: Rolling walker (2 wheeled) Gait Pattern/deviations: Step-through pattern;Decreased stride length Gait velocity: Decreased Gait velocity interpretation: 1.31 - 2.62 ft/sec, indicative of limited community ambulator General Gait Details: Pt with good posture and forward gaze during gait training. Noted increased UE support on the walker - pt reports she is scared she will fall. No unsteadiness  or overt LOB noted.    Stairs Stairs: Yes Stairs assistance: Min guard Stair Management: Two rails;Alternating pattern Number of Stairs: 2 General stair comments: used bilat handrails   Wheelchair Mobility    Modified Rankin (Stroke Patients Only)       Balance Overall balance assessment: Needs assistance Sitting-balance support: Feet supported;No upper extremity supported Sitting balance-Leahy Scale: Fair     Standing balance support: Bilateral upper extremity supported Standing balance-Leahy Scale: Good Standing balance comment: dependent on RW                            Cognition Arousal/Alertness: Awake/alert Behavior During Therapy: WFL for tasks assessed/performed Overall Cognitive Status: Within Functional Limits for tasks assessed                                        Exercises      General Comments General comments (skin integrity, edema, etc.): vss, incision covered by dressing      Pertinent Vitals/Pain Pain Assessment: 0-10 Pain Score: 4  Pain Descriptors / Indicators: Constant;Aching Pain Intervention(s): Monitored during session    Home Living                      Prior Function            PT Goals (current goals can now be found in the care plan section) Progress towards PT goals: Progressing toward goals    Frequency    Min 5X/week      PT Plan  Current plan remains appropriate    Co-evaluation              AM-PAC PT "6 Clicks" Mobility   Outcome Measure  Help needed turning from your back to your side while in a flat bed without using bedrails?: None Help needed moving from lying on your back to sitting on the side of a flat bed without using bedrails?: None Help needed moving to and from a bed to a chair (including a wheelchair)?: None Help needed standing up from a chair using your arms (e.g., wheelchair or bedside chair)?: None Help needed to walk in hospital room?: None Help  needed climbing 3-5 steps with a railing? : A Little 6 Click Score: 23    End of Session Equipment Utilized During Treatment: Gait belt;Back brace Activity Tolerance: Patient tolerated treatment well Patient left: in bed;with call bell/phone within reach;with family/visitor present (sitting EOB) Nurse Communication: Mobility status PT Visit Diagnosis: Unsteadiness on feet (R26.81);Muscle weakness (generalized) (M62.81);Difficulty in walking, not elsewhere classified (R26.2)     Time: 7341-9379 PT Time Calculation (min) (ACUTE ONLY): 18 min  Charges:  $Gait Training: 8-22 mins                     Kittie Plater, PT, DPT Acute Rehabilitation Services Pager #: [phone] Office #: [phone]    Berline Lopes 02/05/2020, 10:47 AM

## 2020-02-09 ENCOUNTER — Other Ambulatory Visit: Payer: Self-pay | Admitting: Gastroenterology

## 2020-02-09 ENCOUNTER — Other Ambulatory Visit: Payer: Self-pay | Admitting: Family Medicine

## 2020-02-10 ENCOUNTER — Encounter: Payer: Self-pay | Admitting: Family Medicine

## 2020-02-17 ENCOUNTER — Other Ambulatory Visit: Payer: Self-pay | Admitting: Family Medicine

## 2020-02-17 NOTE — Telephone Encounter (Signed)
She had 3 months of refills sent 12-02-19.  With this being controlled, we cannot refill early (ie prior to 03-02-20)

## 2020-02-17 NOTE — Telephone Encounter (Signed)
Pt stated she will lose her insurance at the end of this month and needs a refill before then on  ADDERALL VITAMIN D  PHARMACY:  [facility] [place], [place] Phone:  [phone]  Fax:  [phone]

## 2020-02-17 NOTE — Telephone Encounter (Signed)
Routing to PCP for approval.

## 2020-02-18 NOTE — Telephone Encounter (Signed)
Pt made aware that we would not be abel to fill early

## 2020-02-27 MED ORDER — AMPHETAMINE-DEXTROAMPHETAMINE 30 MG PO TABS: ORAL_TABLET | ORAL | 0 refills | Status: DC

## 2020-02-27 NOTE — Telephone Encounter (Signed)
Pt needs an Rx on file for 9/7 fill  Please send Rx in if appropriate

## 2020-03-12 ENCOUNTER — Other Ambulatory Visit (HOSPITAL_COMMUNITY): Payer: Self-pay | Admitting: Neurological Surgery

## 2020-03-12 ENCOUNTER — Other Ambulatory Visit: Payer: Self-pay | Admitting: Neurological Surgery

## 2020-03-12 ENCOUNTER — Other Ambulatory Visit: Payer: Self-pay | Admitting: Family Medicine

## 2020-03-12 DIAGNOSIS — M48062 Spinal stenosis, lumbar region with neurogenic claudication: Secondary | ICD-10-CM

## 2020-03-15 ENCOUNTER — Other Ambulatory Visit: Payer: Self-pay | Admitting: Family Medicine

## 2020-03-23 ENCOUNTER — Encounter: Payer: Self-pay | Admitting: Family Medicine

## 2020-03-23 ENCOUNTER — Ambulatory Visit (INDEPENDENT_AMBULATORY_CARE_PROVIDER_SITE_OTHER): Payer: 59 | Admitting: Family Medicine

## 2020-03-23 ENCOUNTER — Other Ambulatory Visit: Payer: Self-pay

## 2020-03-23 VITALS — BP 128/76 | HR 110 | Temp 98.2°F | Ht 63.75 in | Wt 244.6 lb

## 2020-03-23 DIAGNOSIS — Z0000 Encounter for general adult medical examination without abnormal findings: Secondary | ICD-10-CM

## 2020-03-23 DIAGNOSIS — Z23 Encounter for immunization: Secondary | ICD-10-CM

## 2020-03-23 LAB — BASIC METABOLIC PANEL
BUN: 12 mg/dL (ref 7–25)
CO2: 29 mmol/L (ref 20–32)
Calcium: 9.5 mg/dL (ref 8.6–10.4)
Chloride: 102 mmol/L (ref 98–110)
Creat: 0.99 mg/dL (ref 0.50–1.05)
Glucose, Bld: 131 mg/dL — ABNORMAL HIGH (ref 65–99)
Potassium: 3.4 mmol/L — ABNORMAL LOW (ref 3.5–5.3)
Sodium: 139 mmol/L (ref 135–146)

## 2020-03-23 LAB — HEPATIC FUNCTION PANEL
AG Ratio: 1.3 (calc) (ref 1.0–2.5)
ALT: 21 U/L (ref 6–29)
AST: 17 U/L (ref 10–35)
Albumin: 3.8 g/dL (ref 3.6–5.1)
Alkaline phosphatase (APISO): 134 U/L (ref 37–153)
Bilirubin, Direct: 0.1 mg/dL (ref 0.0–0.2)
Globulin: 3 g/dL (calc) (ref 1.9–3.7)
Indirect Bilirubin: 0.3 mg/dL (calc) (ref 0.2–1.2)
Total Bilirubin: 0.4 mg/dL (ref 0.2–1.2)
Total Protein: 6.8 g/dL (ref 6.1–8.1)

## 2020-03-23 LAB — LIPID PANEL
Cholesterol: 156 mg/dL (ref <200)
HDL: 45 mg/dL — ABNORMAL LOW (ref >=50)
LDL Cholesterol (Calc): 86 mg/dL (calc)
Non-HDL Cholesterol (Calc): 111 mg/dL (calc) (ref <130)
Total CHOL/HDL Ratio: 3.5 (calc) (ref <5.0)
Triglycerides: 155 mg/dL — ABNORMAL HIGH (ref <150)

## 2020-03-23 LAB — CBC WITH DIFFERENTIAL/PLATELET
Absolute Monocytes: 768 cells/uL (ref 200–950)
Basophils Absolute: 115 cells/uL (ref 0–200)
Basophils Relative: 1.2 %
Eosinophils Absolute: 288 cells/uL (ref 15–500)
Eosinophils Relative: 3 %
HCT: 35.8 % (ref 35.0–45.0)
Hemoglobin: 10.7 g/dL — ABNORMAL LOW (ref 11.7–15.5)
Lymphs Abs: 2861 cells/uL (ref 850–3900)
MCH: 24 pg — ABNORMAL LOW (ref 27.0–33.0)
MCHC: 29.9 g/dL — ABNORMAL LOW (ref 32.0–36.0)
MCV: 80.4 fL (ref 80.0–100.0)
MPV: 10.5 fL (ref 7.5–12.5)
Monocytes Relative: 8 %
Neutro Abs: 5568 cells/uL (ref 1500–7800)
Neutrophils Relative %: 58 %
Platelets: 477 10*3/uL — ABNORMAL HIGH (ref 140–400)
RBC: 4.45 10*6/uL (ref 3.80–5.10)
RDW: 13.7 % (ref 11.0–15.0)
Total Lymphocyte: 29.8 %
WBC: 9.6 10*3/uL (ref 3.8–10.8)

## 2020-03-23 LAB — TSH: TSH: 2.37 mIU/L (ref 0.40–4.50)

## 2020-03-23 NOTE — Progress Notes (Signed)
Established Patient Office Visit  Subjective:  Patient ID: [name] Sherman, female    DOB: May 07, 1961  Age: 59 y.o. MRN: [id number]  CC:  Chief Complaint  Patient presents with  . Annual Exam    Doing okay    HPI [name] Sherman presents for physical exam.  She still sees gynecologist yearly.  Her chronic problems include history of obesity, obstructive sleep apnea, migraine headaches, hypertension, hypothyroidism, history of kidney stones, hyperlipidemia.  We have started Ozempic recently and she is tolerating well at 0.5 mg subcu once weekly.  She has lost so for about 27 pounds.  She had Covid infection back in February.  She states she still has some residual symptoms in terms of some fatigue.  She still has some loss of taste and smell.  Overall much improved.  She states she had a sister that died this past year cirrhosis.  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  They are not sure the cause.  She had apparently been on multiple medications and was some question whether some medication related terms of her liver damage.  Maintenance reviewed  -She has had Covid vaccine but they did not have dates with him today -Needs flu vaccine -Prior hepatitis C screen negative -Colonoscopy due 2027 -Pap smear up-to-date and per GYN -She plans to get follow-up mammogram this fall  Past Medical History:  Diagnosis Date  . ADD (attention deficit disorder)   . Anemia   . Anxiety   . Arthritis    shoulder, right (arthritis, tendonitis, bursitis)  . Cancer ([place])    cervical cancer ([place]ization)   . Chronic kidney disease   . Complication of anesthesia   . Depression   . Dyspnea 2020  . GERD (gastroesophageal reflux disease)   . History of cervical cancer    s/p  laser ablation of cervix 1980's  . History of kidney stones   . Hyperlipidemia   . Hypertension   . Hypothyroidism   . IBS (irritable bowel syndrome)   . Irregular heart rate    as a child, has never had any problems     . Kidney stones   . Left ureteral stone   . Migraine   . OSA (obstructive sleep apnea)    pt used cpap up until 2013 states lost wt and did not need anymore  . PONV (postoperative nausea and vomiting)   . Pre-diabetes   . Restless legs   . Sleep apnea    wears CPAP  . Umbilical hernia without obstruction or gangrene   . Urgency of urination   . Vertigo   . Vitamin D deficiency     Past Surgical History:  Procedure Laterality Date  . ABDOMINAL HYSTERECTOMY    . BACK SURGERY  04/27/2018  . BUNIONECTOMY Right 2004  . COLONOSCOPY    . CYSTOSCOPY W/ RETROGRADES Bilateral 05/26/2015   Procedure: CYSTOSCOPY WITH RETROGRADE PYELOGRAM;  Surgeon: Festus Aloe, MD;  Location: [facility];  Service: Urology;  Laterality: Bilateral;  . CYSTOSCOPY W/ URETERAL STENT REMOVAL Left 05/26/2015   Procedure: CYSTOSCOPY WITH STENT REMOVAL;  Surgeon: Festus Aloe, MD;  Location: [facility];  Service: Urology;  Laterality: Left;  . CYSTOSCOPY WITH RETROGRADE PYELOGRAM, URETEROSCOPY AND STENT PLACEMENT Left 05/01/2015   Procedure: CYSTOSCOPY WITH RETROGRADE PYELOGRAM AND STENT PLACEMENT;  Surgeon: Festus Aloe, MD;  Location: [facility];  Service: Urology;  Laterality: Left;  . CYSTOSCOPY WITH STENT PLACEMENT Bilateral 05/26/2015   Procedure: CYSTOSCOPY WITH STENT  PLACEMENT;  Surgeon: Festus Aloe, MD;  [facility];  Service: Urology;  Laterality: Bilateral;  . CYSTOSCOPY WITH URETEROSCOPY Bilateral 05/26/2015   Procedure: CYSTOSCOPY WITH URETEROSCOPY;  Surgeon: Festus Aloe, MD;  Location: [facility];  Service: Urology;  Laterality: Bilateral;  . CYSTOSCOPY/URETEROSCOPY/HOLMIUM LASER/STENT PLACEMENT Right 06/09/2015   Procedure: CYSTOSCOPY/URETEROSCOPY/STENT PLACEMENT REMOVAL LEFT URETERAL STENT;  Surgeon: Festus Aloe, MD;  Location: [facility];  Service: Urology;  Laterality: Right;  . DIAGNOSTIC LAPAROSCOPY    .  HERNIA REPAIR    . HOLMIUM LASER APPLICATION Left 54/65/0354   Procedure: HOLMIUM LASER APPLICATION;  Surgeon: Festus Aloe, MD;  Location: [facility];  Service: Urology;  Laterality: Left;  . HOLMIUM LASER APPLICATION Right 65/68/1275   Procedure: HOLMIUM LASER APPLICATION;  Surgeon: Festus Aloe, MD;  Location: [facility];  Service: Urology;  Laterality: Right;  . INSERTION OF MESH N/A 09/05/2016   Procedure: INSERTION OF MESH;  Surgeon: [name], MD;  Location: [place];  Service: General;  Laterality: N/A;  . LAPAROSCOPIC ASSISTED VAGINAL HYSTERECTOMY  04-23-2002   and Left Salpingoophorectomy/  Anterior Repair/  Tension Free Tape Sling placement  . LASER ABLATION OF THE CERVIX  x2  1980's  . TUBAL LIGATION  1980's  . VENTRAL HERNIA REPAIR N/A 09/05/2016   Procedure: LAPAROSCOPIC VENTRAL HERNIA;  Surgeon: [name], MD;  Location: [place];  Service: General;  Laterality: N/A;    Family History  Problem Relation Age of Onset  . Alcohol abuse Mother   . Lung cancer Mother   . Colon cancer Mother   . Arthritis Father   . Hyperlipidemia Father   . Heart disease Father   . Hypertension Father   . Kidney disease Paternal Uncle   . Cancer Maternal Grandmother        colon  . Colon cancer Maternal Grandmother   . Rectal cancer Maternal Grandmother   . Cancer Paternal Grandmother        breast  . Esophageal cancer Neg Hx   . Stomach cancer Neg Hx     Social History   Socioeconomic History  . Marital status: Legally Separated    Spouse name: Not on file  . Number of children: Not on file  . Years of education: Not on file  . Highest education level: Not on file  Occupational History  . Not on file  Tobacco Use  . Smoking status: Never Smoker  . Smokeless tobacco: Never Used  Vaping Use  . Vaping Use: Never used  Substance and Sexual Activity  . Alcohol use: Never    Comment: 1 x year  . Drug use: Never  . Sexual activity: Yes  Other Topics  Concern  . Not on file  Social History Narrative  . Not on file   Social Determinants of Health   Financial Resource Strain:   . Difficulty of Paying Living Expenses: Not on file  Food Insecurity:   . Worried About Charity fundraiser in the Last Year: Not on file  . Ran Out of Food in the Last Year: Not on file  Transportation Needs:   . Lack of Transportation (Medical): Not on file  . Lack of Transportation (Non-Medical): Not on file  Physical Activity:   . Days of Exercise per Week: Not on file  . Minutes of Exercise per Session: Not on file  Stress:   . Feeling of Stress : Not on file  Social Connections:   .  Frequency of Communication with Friends and Family: Not on file  . Frequency of Social Gatherings with Friends and Family: Not on file  . Attends Religious Services: Not on file  . Active Member of Clubs or Organizations: Not on file  . Attends Archivist Meetings: Not on file  . Marital Status: Not on file  Intimate Partner Violence:   . Fear of Current or Ex-Partner: Not on file  . Emotionally Abused: Not on file  . Physically Abused: Not on file  . Sexually Abused: Not on file    Outpatient Medications Prior to Visit  Medication Sig Dispense Refill  . amphetamine-dextroamphetamine (ADDERALL) 30 MG tablet Take one tablet by mouth twice daily.  May refill in one month. 60 tablet 0  . amphetamine-dextroamphetamine (ADDERALL) 30 MG tablet Take one tablet twice daily. 60 tablet 0  . amphetamine-dextroamphetamine (ADDERALL) 30 MG tablet Take 1 tablet by mouth twice daily.  May refill in 2 months. 60 tablet 0  . atorvastatin (LIPITOR) 10 MG tablet TAKE 1 TABLET BY MOUTH DAILY 90 tablet 2  . Biotin 1000 MCG tablet Take 1,000 mcg by mouth 3 (three) times daily.    . citalopram (CELEXA) 20 MG tablet Take 20 mg by mouth daily.    . cyclobenzaprine (FLEXERIL) 10 MG tablet Take 10 mg by mouth 2 (two) times daily.    . furosemide (LASIX) 20 MG tablet TAKE 1  TABLET(20 MG) BY MOUTH DAILY AS NEEDED FOR SWELLING 30 tablet 1  . levothyroxine (SYNTHROID) 50 MCG tablet TAKE 1 TABLET(50 MCG) BY MOUTH DAILY BEFORE BREAKFAST (Patient taking differently: Take 50 mcg by mouth daily before breakfast. ) 90 tablet 0  . losartan-hydrochlorothiazide (HYZAAR) 50-12.5 MG tablet TAKE 1 TABLET BY MOUTH DAILY 90 tablet 3  . nortriptyline (PAMELOR) 25 MG capsule Take by mouth.    . ondansetron (ZOFRAN-ODT) 8 MG disintegrating tablet Take 1 tablet (8 mg total) by mouth every 8 (eight) hours as needed for nausea or vomiting. 60 tablet 3  . oxyCODONE-acetaminophen (PERCOCET/ROXICET) 5-325 MG tablet Take 1-2 tablets by mouth every 4 (four) hours as needed for moderate pain or severe pain. 60 tablet 0  . pantoprazole (PROTONIX) 40 MG tablet TAKE 1 TABLET(40 MG) BY MOUTH TWICE DAILY 60 tablet 3  . Semaglutide,0.25 or 0.5MG /DOS, (OZEMPIC, 0.25 OR 0.5 MG/DOSE,) 2 MG/1.5ML SOPN Inject 0.375 mLs (0.5 mg total) into the skin once a week. 3 pen 3  . SUMAtriptan (IMITREX) 100 MG tablet May repeat in 2 hours if headache persists or recurs.  Do not take more than 2 in 24 hours (Patient taking differently: Take 100 mg by mouth every 2 (two) hours as needed for migraine. May repeat in 2 hours if headache persists or recurs.  Do not take more than 2 in 24 hours) 10 tablet 11  . Vitamin D, Ergocalciferol, (DRISDOL) 1.25 MG (50000 UNIT) CAPS capsule TAKE 1 CAPSULE BY MOUTH 1 TIME A WEEK (Patient taking differently: Take 50,000 Units by mouth every Monday. TAKE 1 CAPSULE BY MOUTH 1 TIME A WEEK) 12 capsule 3  . methocarbamol (ROBAXIN) 500 MG tablet Take 1 tablet (500 mg total) by mouth every 6 (six) hours as needed for muscle spasms. (Patient not taking: Reported on 03/23/2020) 40 tablet 3  . pramipexole (MIRAPEX) 0.125 MG tablet TAKE 1-2 TABLET EVERY EVENING AS NEEDED FOR RESTLESS LEG SYNDROME (Patient not taking: Reported on 03/23/2020) 60 tablet 3  . gabapentin (NEURONTIN) 300 MG capsule Take 1  capsule (300 mg  total) by mouth 3 (three) times daily. (Patient not taking: Reported on 01/28/2020) 100 capsule 3   No facility-administered medications prior to visit.    Allergies  Allergen Reactions  . Keflex [Cephalexin] Swelling    Tongue swelling    ROS Review of Systems  Constitutional: Negative for activity change, appetite change, fever and unexpected weight change.  HENT: Negative for ear pain, hearing loss, sore throat and trouble swallowing.   Eyes: Negative for visual disturbance.  Respiratory: Negative for cough and shortness of breath.   Cardiovascular: Negative for chest pain and palpitations.  Gastrointestinal: Negative for abdominal pain, blood in stool, constipation and diarrhea.  Endocrine: Negative for polydipsia and polyuria.  Genitourinary: Negative for dysuria and hematuria.  Musculoskeletal: Negative for arthralgias, back pain and myalgias.  Skin: Negative for rash.  Neurological: Negative for dizziness, syncope and headaches.  Hematological: Negative for adenopathy.  Psychiatric/Behavioral: Negative for confusion and dysphoric mood.      Objective:    Physical Exam Constitutional:      Appearance: She is well-developed.  Eyes:     Pupils: Pupils are equal, round, and reactive to light.  Neck:     Thyroid: No thyromegaly.     Vascular: No JVD.  Cardiovascular:     Rate and Rhythm: Normal rate and regular rhythm.     Heart sounds: No gallop.   Pulmonary:     Effort: Pulmonary effort is normal. No respiratory distress.     Breath sounds: Normal breath sounds. No wheezing or rales.  Musculoskeletal:     Cervical back: Neck supple.     Right lower leg: No edema.     Left lower leg: No edema.  Neurological:     Mental Status: She is alert.     BP 128/76   Pulse (!) 110   Temp 98.2 F (36.8 C) (Oral)   Ht 5' 3.75" (1.619 m)   Wt 244 lb 9.6 oz (110.9 kg)   SpO2 97%   BMI 42.32 kg/m  Wt Readings from Last 3 Encounters:  03/23/20 244 lb  9.6 oz (110.9 kg)  02/03/20 247 lb 2.2 oz (112.1 kg)  01/30/20 247 lb 3.2 oz (112.1 kg)     Health Maintenance Due  Topic Date Due  . COVID-19 Vaccine (1) Never done    There are no preventive care reminders to display for this patient.  Lab Results  Component Value Date   TSH 1.34 03/22/2019   Lab Results  Component Value Date   WBC 18.3 (H) 02/04/2020   HGB 9.2 (L) 02/04/2020   HCT 29.5 (L) 02/04/2020   MCV 83.3 02/04/2020   PLT 360 02/04/2020   Lab Results  Component Value Date   NA 141 02/04/2020   K 4.4 02/04/2020   CO2 30 02/04/2020   GLUCOSE 93 02/04/2020   BUN 17 02/04/2020   CREATININE 0.98 02/04/2020   BILITOT 0.3 03/22/2019   ALKPHOS 138 (H) 03/22/2019   AST 14 03/22/2019   ALT 20 03/22/2019   PROT 6.5 03/22/2019   ALBUMIN 3.5 03/22/2019   CALCIUM 8.6 (L) 02/04/2020   ANIONGAP 7 02/04/2020   GFR 68.15 03/22/2019   Lab Results  Component Value Date   CHOL 155 03/22/2019   Lab Results  Component Value Date   HDL 58.90 03/22/2019   Lab Results  Component Value Date   LDLCALC 74 03/22/2019   Lab Results  Component Value Date   TRIG 112.0 03/22/2019   Lab Results  Component  Value Date   CHOLHDL 3 03/22/2019   Lab Results  Component Value Date   HGBA1C 6.5 (H) 01/30/2020      Assessment & Plan:   Problem List Items Addressed This Visit    None    Visit Diagnoses    Physical exam    -  Primary   Relevant Orders   Basic metabolic panel   Lipid panel   CBC with Differential/Platelet   TSH   Hepatic function panel   Need for influenza vaccination       Relevant Orders   Flu Vaccine QUAD 6+ mos PF IM (Fluarix Quad PF) (Completed)     -Flu vaccine given -Obtain follow-up screening labs -We discussed Shingrix vaccine and she will check on insurance coverage -Continue weight loss efforts -We recommend a 1-month follow-up and recheck A1c then.  A1c not checked today as this was just done in August.  No orders of the defined types  were placed in this encounter.   Follow-up: Return in about 3 months (around 06/22/2020).    Carolann Littler, MD

## 2020-03-23 NOTE — Patient Instructions (Signed)

## 2020-03-24 ENCOUNTER — Encounter: Payer: Self-pay | Admitting: Family Medicine

## 2020-03-24 ENCOUNTER — Other Ambulatory Visit: Payer: Self-pay | Admitting: Family Medicine

## 2020-03-24 DIAGNOSIS — D509 Iron deficiency anemia, unspecified: Secondary | ICD-10-CM

## 2020-03-25 ENCOUNTER — Encounter (HOSPITAL_COMMUNITY): Payer: Self-pay

## 2020-03-25 ENCOUNTER — Ambulatory Visit (HOSPITAL_COMMUNITY)
Admission: RE | Admit: 2020-03-25 | Discharge: 2020-03-25 | Disposition: A | Discharge status: Home / Self Care | Payer: 59 | Source: Ambulatory Visit | Attending: Neurological Surgery | Admitting: Neurological Surgery

## 2020-03-25 ENCOUNTER — Other Ambulatory Visit: Payer: Self-pay

## 2020-03-25 DIAGNOSIS — M48062 Spinal stenosis, lumbar region with neurogenic claudication: Secondary | ICD-10-CM | POA: Diagnosis not present

## 2020-03-25 MED ORDER — IOHEXOL 300 MG/ML  SOLN
75.0000 mL | Freq: Once | INTRAMUSCULAR | Status: AC | PRN
  Administered 2020-03-25: 75 mL via INTRAVENOUS

## 2020-04-07 ENCOUNTER — Encounter: Payer: Self-pay | Admitting: Family Medicine

## 2020-04-15 ENCOUNTER — Other Ambulatory Visit: Payer: Self-pay | Admitting: Family Medicine

## 2020-04-17 ENCOUNTER — Other Ambulatory Visit: Payer: Self-pay | Admitting: Family Medicine

## 2020-04-27 ENCOUNTER — Encounter: Payer: Self-pay | Admitting: Family Medicine

## 2020-04-27 MED ORDER — AMPHETAMINE-DEXTROAMPHETAMINE 30 MG PO TABS
ORAL_TABLET | ORAL | 0 refills | Status: DC
Start: 2020-04-27 — End: 2020-07-21

## 2020-04-30 ENCOUNTER — Other Ambulatory Visit: Payer: Self-pay | Admitting: Family Medicine

## 2020-05-01 MED ORDER — LEVOTHYROXINE SODIUM 50 MCG PO TABS
ORAL_TABLET | ORAL | 3 refills | Status: DC
Start: 2020-05-01 — End: 2020-10-20

## 2020-05-01 NOTE — Telephone Encounter (Signed)
Levothyroxine refills have been sent to [facility] on [address].  Unfortunately, until our staffing shortage needs are met we are unable to take new patients at this time

## 2020-05-01 NOTE — Addendum Note (Signed)
Addended by: [name] on: 05/01/2020 12:55 PM   Modules accepted: Orders

## 2020-05-01 NOTE — Telephone Encounter (Signed)
Can you confirm which pharmacy she is requesting?   She has recently switched a couple of prescriptions to [facility].

## 2020-05-03 ENCOUNTER — Other Ambulatory Visit: Payer: Self-pay | Admitting: Family Medicine

## 2020-05-03 MED ORDER — CITALOPRAM HYDROBROMIDE 20 MG PO TABS
20.0000 mg | ORAL_TABLET | Freq: Every day | ORAL | 3 refills | Status: DC
Start: 2020-05-03 — End: 2021-03-16

## 2020-05-12 ENCOUNTER — Encounter: Payer: Self-pay | Admitting: Family Medicine

## 2020-06-25 ENCOUNTER — Other Ambulatory Visit: Payer: Self-pay

## 2020-06-25 ENCOUNTER — Ambulatory Visit
Admission: EM | Admit: 2020-06-25 | Discharge: 2020-06-25 | Disposition: A | Discharge status: Home / Self Care | Payer: 59 | Attending: Emergency Medicine | Admitting: Emergency Medicine

## 2020-06-25 ENCOUNTER — Encounter: Payer: Self-pay | Admitting: Emergency Medicine

## 2020-06-25 DIAGNOSIS — J069 Acute upper respiratory infection, unspecified: Secondary | ICD-10-CM

## 2020-06-25 MED ORDER — IBUPROFEN 800 MG PO TABS: 800.0000 mg | ORAL_TABLET | Freq: Three times a day (TID) | ORAL | 0 refills | Status: DC

## 2020-06-25 MED ORDER — BENZONATATE 200 MG PO CAPS: 200.0000 mg | ORAL_CAPSULE | Freq: Three times a day (TID) | ORAL | 0 refills | Status: AC | PRN

## 2020-06-25 MED ORDER — GUAIFENESIN-CODEINE 100-10 MG/5ML PO SOLN: 5.0000 mL | Freq: Every evening | ORAL | 0 refills | Status: DC | PRN

## 2020-06-25 NOTE — ED Provider Notes (Signed)
[name]    CSN: [id number] Arrival date & time: 06/25/20  1457      History   Chief Complaint Chief Complaint  Patient presents with  . Cough    HPI [name] Sherman is a 59 y.o. female history of hypertension, hyperlipidemia, CKD, presenting today for evaluation of URI symptoms.  Reports symptoms began over the past 2 days.  Reports a lot of cough and congestion.  Throat very sore from cough.  Covid test yesterday which was negative.  HPI  Past Medical History:  Diagnosis Date  . ADD (attention deficit disorder)   . Anemia   . Anxiety   . Arthritis    shoulder, right (arthritis, tendonitis, bursitis)  . Cancer ([place])    cervical cancer ([place])   . Chronic kidney disease   . Complication of anesthesia   . Depression   . Dyspnea 2020  . GERD (gastroesophageal reflux disease)   . History of cervical cancer    s/p  laser ablation of cervix 1980's  . History of kidney stones   . Hyperlipidemia   . Hypertension   . Hypothyroidism   . IBS (irritable bowel syndrome)   . Irregular heart rate    as a child, has never had any problems   . Kidney stones   . Left ureteral stone   . Migraine   . OSA (obstructive sleep apnea)    pt used cpap up until 2013 states lost wt and did not need anymore  . PONV (postoperative nausea and vomiting)   . Pre-diabetes   . Restless legs   . Sleep apnea    wears CPAP  . Umbilical hernia without obstruction or gangrene   . Urgency of urination   . Vertigo   . Vitamin D deficiency     Patient Active Problem List   Diagnosis Date Noted  . Lumbar stenosis with neurogenic claudication 02/03/2020  . Morbid obesity ([place]) 08/04/2019  . Restless legs   . Episodic recurrent vertigo 01/30/2019  . Lumbar radiculopathy, chronic 01/21/2019  . Hypothyroid 12/19/2016  . Essential hypertension 09/21/2015  . UTI (lower urinary tract infection) 05/01/2015  . Ureteral stone with hydronephrosis 05/01/2015  . ADD (attention  deficit disorder) 12/26/2012  . Incisional hernia, without obstruction or gangrene 12/26/2012  . Migraine headache 11/14/2012  . Hyperlipidemia 11/14/2012  . OSA (obstructive sleep apnea) 11/14/2012    Past Surgical History:  Procedure Laterality Date  . ABDOMINAL HYSTERECTOMY    . BACK SURGERY  04/27/2018  . BUNIONECTOMY Right 2004  . COLONOSCOPY    . CYSTOSCOPY W/ RETROGRADES Bilateral 05/26/2015   Procedure: CYSTOSCOPY WITH RETROGRADE PYELOGRAM;  Surgeon: Festus Aloe, MD;  Location: [facility];  Service: Urology;  Laterality: Bilateral;  . CYSTOSCOPY W/ URETERAL STENT REMOVAL Left 05/26/2015   Procedure: CYSTOSCOPY WITH STENT REMOVAL;  Surgeon: Festus Aloe, MD;  Location: [facility];  Service: Urology;  Laterality: Left;  . CYSTOSCOPY WITH RETROGRADE PYELOGRAM, URETEROSCOPY AND STENT PLACEMENT Left 05/01/2015   Procedure: CYSTOSCOPY WITH RETROGRADE PYELOGRAM AND STENT PLACEMENT;  Surgeon: Festus Aloe, MD;  Location: [facility];  Service: Urology;  Laterality: Left;  . CYSTOSCOPY WITH STENT PLACEMENT Bilateral 05/26/2015   Procedure: CYSTOSCOPY WITH STENT PLACEMENT;  Surgeon: Festus Aloe, MD;  Location: [facility];  Service: Urology;  Laterality: Bilateral;  . CYSTOSCOPY WITH URETEROSCOPY Bilateral 05/26/2015   Procedure: CYSTOSCOPY WITH URETEROSCOPY;  Surgeon: Festus Aloe, MD;  Location: [facility];  Service:  Urology;  Laterality: Bilateral;  . CYSTOSCOPY/URETEROSCOPY/HOLMIUM LASER/STENT PLACEMENT Right 06/09/2015   Procedure: CYSTOSCOPY/URETEROSCOPY/STENT PLACEMENT REMOVAL LEFT URETERAL STENT;  Surgeon: Festus Aloe, MD;  Location: [facility];  Service: Urology;  Laterality: Right;  . DIAGNOSTIC LAPAROSCOPY    . HERNIA REPAIR    . HOLMIUM LASER APPLICATION Left [id number]   Procedure: HOLMIUM LASER APPLICATION;  Surgeon: Festus Aloe, MD;  Location: [facility];  Service: Urology;   Laterality: Left;  . HOLMIUM LASER APPLICATION Right [id number]   Procedure: HOLMIUM LASER APPLICATION;  Surgeon: Festus Aloe, MD;  Location: [facility];  Service: Urology;  Laterality: Right;  . INSERTION OF MESH N/A 09/05/2016   Procedure: INSERTION OF MESH;  Surgeon: [name], MD;  Location: [place];  Service: General;  Laterality: N/A;  . LAPAROSCOPIC ASSISTED VAGINAL HYSTERECTOMY  04-23-2002   and Left Salpingoophorectomy/  Anterior Repair/  Tension Free Tape Sling placement  . LASER ABLATION OF THE CERVIX  x2  1980's  . TUBAL LIGATION  1980's  . VENTRAL HERNIA REPAIR N/A 09/05/2016   Procedure: LAPAROSCOPIC VENTRAL HERNIA;  Surgeon: [name], MD;  Location: [place];  Service: General;  Laterality: N/A;    OB History   No obstetric history on file.      Home Medications    Prior to Admission medications   Medication Sig Start Date End Date Taking? Authorizing Provider  benzonatate (TESSALON) 200 MG capsule Take 1 capsule (200 mg total) by mouth 3 (three) times daily as needed for up to 7 days for cough. 06/25/20 07/02/20 Yes Joab Carden C, PA-C  guaiFENesin-codeine 100-10 MG/5ML syrup Take 5-10 mLs by mouth at bedtime as needed for cough. 06/25/20  Yes Hayslee Casebolt C, PA-C  ibuprofen (ADVIL) 800 MG tablet Take 1 tablet (800 mg total) by mouth 3 (three) times daily. 06/25/20  Yes Keyoni Lapinski C, PA-C  amphetamine-dextroamphetamine (ADDERALL) 30 MG tablet Take one tablet by mouth twice daily.  May refill in one month. 04/27/20   Burchette, Alinda Sierras, MD  amphetamine-dextroamphetamine (ADDERALL) 30 MG tablet Take one tablet twice daily. 04/27/20   Burchette, Alinda Sierras, MD  amphetamine-dextroamphetamine (ADDERALL) 30 MG tablet Take 1 tablet by mouth twice daily.  May refill in 2 months. 04/27/20   Burchette, Alinda Sierras, MD  atorvastatin (LIPITOR) 10 MG tablet TAKE 1 TABLET BY MOUTH DAILY 02/11/20   Burchette, Alinda Sierras, MD  Biotin 1000 MCG tablet Take 1,000 mcg by mouth 3 (three)  times daily.    [provider]  citalopram (CELEXA) 20 MG tablet Take 1 tablet (20 mg total) by mouth daily. 05/03/20   Burchette, Alinda Sierras, MD  cyclobenzaprine (FLEXERIL) 10 MG tablet Take 10 mg by mouth 2 (two) times daily. 03/19/20   [provider]  furosemide (LASIX) 20 MG tablet TAKE 1 TABLET(20 MG) BY MOUTH DAILY AS NEEDED FOR SWELLING 04/15/20   Burchette, Alinda Sierras, MD  levothyroxine (SYNTHROID) 50 MCG tablet TAKE 1 TABLET(50 MCG) BY MOUTH DAILY BEFORE BREAKFAST 05/01/20   Burchette, Alinda Sierras, MD  losartan-hydrochlorothiazide (HYZAAR) 50-12.5 MG tablet TAKE 1 TABLET BY MOUTH DAILY 03/13/20   Burchette, Alinda Sierras, MD  methocarbamol (ROBAXIN) 500 MG tablet Take 1 tablet (500 mg total) by mouth every 6 (six) hours as needed for muscle spasms. Patient not taking: Reported on 03/23/2020 02/05/20   Kristeen Miss, MD  nortriptyline (PAMELOR) 25 MG capsule Take by mouth. 03/19/20   [provider]  ondansetron (ZOFRAN-ODT) 8 MG disintegrating tablet Take 1 tablet (  8 mg total) by mouth every 8 (eight) hours as needed for nausea or vomiting. 05/03/19   Armbruster, Carlota Raspberry, MD  oxyCODONE-acetaminophen (PERCOCET/ROXICET) 5-325 MG tablet Take 1-2 tablets by mouth every 4 (four) hours as needed for moderate pain or severe pain. 02/05/20   Kristeen Miss, MD  pantoprazole (PROTONIX) 40 MG tablet TAKE 1 TABLET(40 MG) BY MOUTH TWICE DAILY 02/10/20   Armbruster, Carlota Raspberry, MD  pramipexole (MIRAPEX) 0.125 MG tablet TAKE 1-2 TABLET EVERY EVENING AS NEEDED FOR RESTLESS LEG SYNDROME Patient not taking: Reported on 03/23/2020 03/16/20   [name], MD  Semaglutide,0.25 or 0.5MG /DOS, (OZEMPIC, 0.25 OR 0.5 MG/DOSE,) 2 MG/1.5ML SOPN Inject 0.375 mLs (0.5 mg total) into the skin once a week. 01/10/20   Burchette, Alinda Sierras, MD  SUMAtriptan (IMITREX) 100 MG tablet May repeat in 2 hours if headache persists or recurs.  Do not take more than 2 in 24 hours Patient taking differently: Take 100 mg by mouth  every 2 (two) hours as needed for migraine. May repeat in 2 hours if headache persists or recurs.  Do not take more than 2 in 24 hours 12/02/19   Burchette, Alinda Sierras, MD  Vitamin D, Ergocalciferol, (DRISDOL) 1.25 MG (50000 UNIT) CAPS capsule TAKE 1 CAPSULE BY MOUTH 1 TIME A WEEK Patient taking differently: Take 50,000 Units by mouth every Monday. TAKE 1 CAPSULE BY MOUTH 1 TIME A WEEK 08/26/19   Burchette, Alinda Sierras, MD    Family History Family History  Problem Relation Age of Onset  . Alcohol abuse Mother   . Lung cancer Mother   . Colon cancer Mother   . Arthritis Father   . Hyperlipidemia Father   . Heart disease Father   . Hypertension Father   . Kidney disease Paternal Uncle   . Cancer Maternal Grandmother        colon  . Colon cancer Maternal Grandmother   . Rectal cancer Maternal Grandmother   . Cancer Paternal Grandmother        breast  . Esophageal cancer Neg Hx   . Stomach cancer Neg Hx     Social History Social History   Tobacco Use  . Smoking status: Never Smoker  . Smokeless tobacco: Never Used  Vaping Use  . Vaping Use: Never used  Substance Use Topics  . Alcohol use: Never    Comment: 1 x year  . Drug use: Never     Allergies   Keflex [cephalexin]   Review of Systems Review of Systems  Constitutional: Negative for activity change, appetite change, chills, fatigue and fever.  HENT: Positive for congestion, rhinorrhea, sinus pressure and sore throat. Negative for ear pain and trouble swallowing.   Eyes: Negative for discharge and redness.  Respiratory: Positive for cough. Negative for chest tightness and shortness of breath.   Cardiovascular: Negative for chest pain.  Gastrointestinal: Negative for abdominal pain, diarrhea, nausea and vomiting.  Musculoskeletal: Negative for myalgias.  Skin: Negative for rash.  Neurological: Positive for headaches. Negative for dizziness and light-headedness.     Physical Exam Triage Vital Signs ED Triage Vitals  Enc  Vitals Group     BP 06/25/20 1710 (!) 169/90     Pulse Rate 06/25/20 1710 (!) 124     Resp 06/25/20 1710 20     Temp 06/25/20 1710 98 F (36.7 C)     Temp Source 06/25/20 1710 Oral     SpO2 06/25/20 1710 97 %     Weight --  Height --      Head Circumference --      Peak Flow --      Pain Score 06/25/20 1711 5     Pain Loc --      Pain Edu? --      Excl. in [place]? --    No data found.  Updated Vital Signs BP (!) 169/90 (BP Location: Left Arm)   Pulse (!) 124   Temp 98 F (36.7 C) (Oral)   Resp 20   SpO2 97%   Visual Acuity Right Eye Distance:   Left Eye Distance:   Bilateral Distance:    Right Eye Near:   Left Eye Near:    Bilateral Near:     Physical Exam Vitals and nursing note reviewed.  Constitutional:      Appearance: She is well-developed and well-nourished.     Comments: No acute distress  HENT:     Head: Normocephalic and atraumatic.     Ears:     Comments: Bilateral ears without tenderness to palpation of external auricle, tragus and mastoid, EAC's without erythema or swelling, TM's with good bony landmarks and cone of light. Non erythematous.     Nose: Nose normal.     Mouth/Throat:     Comments: Oral mucosa pink and moist, no tonsillar enlargement or exudate. Posterior pharynx patent and nonerythematous, no uvula deviation or swelling. Normal phonation. Eyes:     Conjunctiva/sclera: Conjunctivae normal.  Cardiovascular:     Rate and Rhythm: Normal rate and regular rhythm.  Pulmonary:     Effort: Pulmonary effort is normal. No respiratory distress.     Comments: Breathing comfortably at rest, CTABL, no wheezing, rales or other adventitious sounds auscultated Abdominal:     General: There is no distension.  Musculoskeletal:        General: Normal range of motion.     Cervical back: Neck supple.  Skin:    General: Skin is warm and dry.  Neurological:     Mental Status: She is alert and oriented to person, place, and time.  Psychiatric:         Mood and Affect: Mood and affect normal.      UC Treatments / Results  Labs (all labs ordered are listed, but only abnormal results are displayed) Labs Reviewed - No data to display  EKG   Radiology No results found.  Procedures Procedures (including critical care time)  Medications Ordered in UC Medications - No data to display  Initial Impression / Assessment and Plan / UC Course  I have reviewed the triage vital signs and the nursing notes.  Pertinent labs & imaging results that were available during my care of the patient were reviewed by me and considered in my medical decision making (see chart for details).     Viral URI with cough-Covid test pending, recommending symptomatic and supportive care of cough and congestion rest and fluids.  Monitor for gradual resolution.  Discussed strict return precautions. Patient verbalized understanding and is agreeable with plan.  Final Clinical Impressions(s) / UC Diagnoses   Final diagnoses:  Viral URI with cough     Discharge Instructions     Tessalon for cough during the day Robitussin with codeine at bedtime May continue Mucinex during the day Ibuprofen and Tylenol for throat pain Rest and fluids Honey and hot tea Follow-up if not improving or worsening despite the above over the next 4 to 5 days    ED Prescriptions  Medication Sig Dispense Auth. Provider   guaiFENesin-codeine 100-10 MG/5ML syrup Take 5-10 mLs by mouth at bedtime as needed for cough. 120 mL Chanese Hartsough C, PA-C   benzonatate (TESSALON) 200 MG capsule Take 1 capsule (200 mg total) by mouth 3 (three) times daily as needed for up to 7 days for cough. 28 capsule Quin Mcpherson C, PA-C   ibuprofen (ADVIL) 800 MG tablet Take 1 tablet (800 mg total) by mouth 3 (three) times daily. 21 tablet Rahm Minix, Christopher C, PA-C     PDMP not reviewed this encounter.   Lew Dawes, [place] 06/25/20 1811

## 2020-06-25 NOTE — ED Triage Notes (Signed)
Pt here for URI sx and cough with congestion; pt had negative covid test yesterday

## 2020-06-25 NOTE — Discharge Instructions (Signed)
Tessalon for cough during the day Robitussin with codeine at bedtime May continue Mucinex during the day Ibuprofen and Tylenol for throat pain Rest and fluids Honey and hot tea Follow-up if not improving or worsening despite the above over the next 4 to 5 days

## 2020-07-03 ENCOUNTER — Encounter: Payer: Self-pay | Admitting: Family Medicine

## 2020-07-06 DIAGNOSIS — M5416 Radiculopathy, lumbar region: Secondary | ICD-10-CM | POA: Diagnosis not present

## 2020-07-06 DIAGNOSIS — M5116 Intervertebral disc disorders with radiculopathy, lumbar region: Secondary | ICD-10-CM | POA: Diagnosis not present

## 2020-07-21 ENCOUNTER — Other Ambulatory Visit: Payer: Self-pay | Admitting: Family Medicine

## 2020-07-21 MED ORDER — AMPHETAMINE-DEXTROAMPHETAMINE 30 MG PO TABS
ORAL_TABLET | ORAL | 0 refills | Status: DC
Start: 2020-07-21 — End: 2020-10-26

## 2020-07-21 MED ORDER — AMPHETAMINE-DEXTROAMPHETAMINE 30 MG PO TABS
ORAL_TABLET | ORAL | 0 refills | Status: DC
Start: 2020-07-21 — End: 2020-10-24

## 2020-07-23 DIAGNOSIS — Z01419 Encounter for gynecological examination (general) (routine) without abnormal findings: Secondary | ICD-10-CM | POA: Diagnosis not present

## 2020-07-23 DIAGNOSIS — Z1231 Encounter for screening mammogram for malignant neoplasm of breast: Secondary | ICD-10-CM | POA: Diagnosis not present

## 2020-07-23 DIAGNOSIS — Z6841 Body Mass Index (BMI) 40.0 and over, adult: Secondary | ICD-10-CM | POA: Diagnosis not present

## 2020-07-30 DIAGNOSIS — M5416 Radiculopathy, lumbar region: Secondary | ICD-10-CM | POA: Diagnosis not present

## 2020-07-30 DIAGNOSIS — M5136 Other intervertebral disc degeneration, lumbar region: Secondary | ICD-10-CM | POA: Diagnosis not present

## 2020-07-31 DIAGNOSIS — R58 Hemorrhage, not elsewhere classified: Secondary | ICD-10-CM | POA: Diagnosis not present

## 2020-07-31 DIAGNOSIS — L918 Other hypertrophic disorders of the skin: Secondary | ICD-10-CM | POA: Diagnosis not present

## 2020-08-02 ENCOUNTER — Encounter: Payer: Self-pay | Admitting: Family Medicine

## 2020-08-04 ENCOUNTER — Telehealth: Payer: Self-pay | Admitting: Family Medicine

## 2020-08-04 NOTE — Telephone Encounter (Signed)
Patient dropped off paper work. She would like it to be faxed to [phone] when it has been completed.  Paperwork was placed in folder.

## 2020-08-17 ENCOUNTER — Other Ambulatory Visit: Payer: Self-pay | Admitting: Family Medicine

## 2020-08-18 DIAGNOSIS — E119 Type 2 diabetes mellitus without complications: Secondary | ICD-10-CM | POA: Diagnosis not present

## 2020-08-27 ENCOUNTER — Other Ambulatory Visit (HOSPITAL_COMMUNITY): Payer: Self-pay | Admitting: Surgery

## 2020-09-08 ENCOUNTER — Ambulatory Visit (HOSPITAL_COMMUNITY)
Admission: RE | Admit: 2020-09-08 | Discharge: 2020-09-08 | Disposition: A | Discharge status: Home / Self Care | Payer: Medicare Other | Source: Ambulatory Visit | Attending: Surgery | Admitting: Surgery

## 2020-09-08 ENCOUNTER — Other Ambulatory Visit: Payer: Self-pay

## 2020-09-08 DIAGNOSIS — Z0389 Encounter for observation for other suspected diseases and conditions ruled out: Secondary | ICD-10-CM | POA: Diagnosis not present

## 2020-09-08 DIAGNOSIS — M47814 Spondylosis without myelopathy or radiculopathy, thoracic region: Secondary | ICD-10-CM | POA: Diagnosis not present

## 2020-09-18 ENCOUNTER — Other Ambulatory Visit: Payer: Self-pay | Admitting: Family Medicine

## 2020-09-21 ENCOUNTER — Other Ambulatory Visit (HOSPITAL_COMMUNITY): Payer: Self-pay | Admitting: Surgery

## 2020-09-22 ENCOUNTER — Other Ambulatory Visit (HOSPITAL_COMMUNITY): Payer: Medicare Other

## 2020-09-29 DIAGNOSIS — M25551 Pain in right hip: Secondary | ICD-10-CM | POA: Diagnosis not present

## 2020-09-29 DIAGNOSIS — M25512 Pain in left shoulder: Secondary | ICD-10-CM | POA: Diagnosis not present

## 2020-10-01 ENCOUNTER — Other Ambulatory Visit: Payer: Self-pay

## 2020-10-01 DIAGNOSIS — G4733 Obstructive sleep apnea (adult) (pediatric): Secondary | ICD-10-CM | POA: Diagnosis not present

## 2020-10-02 ENCOUNTER — Ambulatory Visit (INDEPENDENT_AMBULATORY_CARE_PROVIDER_SITE_OTHER): Payer: Medicare Other | Admitting: Family Medicine

## 2020-10-02 ENCOUNTER — Encounter: Payer: Self-pay | Admitting: Family Medicine

## 2020-10-02 VITALS — BP 160/82 | HR 120 | Temp 98.1°F | Wt 276.4 lb

## 2020-10-02 DIAGNOSIS — I10 Essential (primary) hypertension: Secondary | ICD-10-CM

## 2020-10-02 DIAGNOSIS — R739 Hyperglycemia, unspecified: Secondary | ICD-10-CM | POA: Diagnosis not present

## 2020-10-02 DIAGNOSIS — R233 Spontaneous ecchymoses: Secondary | ICD-10-CM

## 2020-10-02 DIAGNOSIS — R238 Other skin changes: Secondary | ICD-10-CM | POA: Diagnosis not present

## 2020-10-02 DIAGNOSIS — E1165 Type 2 diabetes mellitus with hyperglycemia: Secondary | ICD-10-CM

## 2020-10-02 LAB — CBC WITH DIFFERENTIAL/PLATELET
Basophils Absolute: 0.1 10*3/uL (ref 0.0–0.1)
Basophils Relative: 0.8 % (ref 0.0–3.0)
Eosinophils Absolute: 0.1 10*3/uL (ref 0.0–0.7)
Eosinophils Relative: 1 % (ref 0.0–5.0)
HCT: 36.5 % (ref 36.0–46.0)
Hemoglobin: 11.7 g/dL — ABNORMAL LOW (ref 12.0–15.0)
Lymphocytes Relative: 20.4 % (ref 12.0–46.0)
Lymphs Abs: 2.7 10*3/uL (ref 0.7–4.0)
MCHC: 32 g/dL (ref 30.0–36.0)
MCV: 77.9 fl — ABNORMAL LOW (ref 78.0–100.0)
Monocytes Absolute: 0.6 10*3/uL (ref 0.1–1.0)
Monocytes Relative: 4.9 % (ref 3.0–12.0)
Neutro Abs: 9.6 10*3/uL — ABNORMAL HIGH (ref 1.4–7.7)
Neutrophils Relative %: 72.9 % (ref 43.0–77.0)
Platelets: 460 10*3/uL — ABNORMAL HIGH (ref 150.0–400.0)
RBC: 4.69 Mil/uL (ref 3.87–5.11)
RDW: 15.9 % — ABNORMAL HIGH (ref 11.5–15.5)
WBC: 13.1 10*3/uL — ABNORMAL HIGH (ref 4.0–10.5)

## 2020-10-02 LAB — PROTIME-INR
INR: 1 ratio (ref 0.8–1.0)
Prothrombin Time: 10.7 s (ref 9.6–13.1)

## 2020-10-02 LAB — APTT: aPTT: 28.7 s (ref 23.4–32.7)

## 2020-10-02 LAB — HEMOGLOBIN A1C: Hgb A1c MFr Bld: 6.9 % — ABNORMAL HIGH (ref 4.6–6.5)

## 2020-10-02 MED ORDER — LOSARTAN POTASSIUM-HCTZ 100-12.5 MG PO TABS: 1.0000 | ORAL_TABLET | Freq: Every day | ORAL | 5 refills | Status: DC

## 2020-10-02 MED ORDER — OZEMPIC (1 MG/DOSE) 2 MG/1.5ML ~~LOC~~ SOPN: 1.0000 mg | PEN_INJECTOR | SUBCUTANEOUS | 5 refills | Status: DC

## 2020-10-02 NOTE — Progress Notes (Signed)
Established Patient Office Visit  Subjective:  Patient ID: [name] [place], female    DOB: Jul 18, 1960  Age: 60 y.o. MRN: [id number]  CC:  Chief Complaint  Patient presents with  . Weight Loss    HPI [name] [place] presents for several items as below  She has morbid obesity.  She has multiple comorbidities including hypertension, obstructive sleep apnea, osteoarthritis, dyslipidemia, type 2 diabetes.  She is looking at gastric bypass surgery.  She has forms that need to be completed to get approved for that.  She has tried multiple weight loss programs in the past including weight watchers, Adipex, phentermine, Slim fast all with limited success.  She is currently on Ozempic 0.5 mg.  Her last A1c was 6.5%.  She needs follow-up.  She is tolerating Ozempic without side effect  Hypertension treated with losartan HCTZ 50/12.5 mg daily.  Not monitoring blood pressures currently but up today.  She relates easy bruising for several months now.  She had severe Covid infection a year ago and feels like she has had more frequent bruising since then.  Last CBC showed mild thrombocytosis.  She does not have any bleeding elsewhere in terms of nosebleeds, vaginal bleeding, hematuria, etc. no family history of bleeding disorder.  Does have multiple bruises on her upper extremities and lower extremities and she states these were nontraumatic.  Past Medical History:  Diagnosis Date  . ADD (attention deficit disorder)   . Anemia   . Anxiety   . Arthritis    shoulder, right (arthritis, tendonitis, bursitis)  . Cancer ([place])    cervical cancer ([place])   . Chronic kidney disease   . Complication of anesthesia   . Depression   . Dyspnea 2020  . GERD (gastroesophageal reflux disease)   . History of cervical cancer    s/p  laser ablation of cervix 1980's  . History of kidney stones   . Hyperlipidemia   . Hypertension   . Hypothyroidism   . IBS (irritable bowel syndrome)   . Irregular heart  rate    as a child, has never had any problems   . Kidney stones   . Left ureteral stone   . Migraine   . OSA (obstructive sleep apnea)    pt used cpap up until 2013 states lost wt and did not need anymore  . PONV (postoperative nausea and vomiting)   . Pre-diabetes   . Restless legs   . Sleep apnea    wears CPAP  . Umbilical hernia without obstruction or gangrene   . Urgency of urination   . Vertigo   . Vitamin D deficiency     Past Surgical History:  Procedure Laterality Date  . ABDOMINAL HYSTERECTOMY    . BACK SURGERY  04/27/2018  . BUNIONECTOMY Right 2004  . COLONOSCOPY    . CYSTOSCOPY W/ RETROGRADES Bilateral 05/26/2015   Procedure: CYSTOSCOPY WITH RETROGRADE PYELOGRAM;  Surgeon: Festus Aloe, MD;  Location: [facility];  Service: Urology;  Laterality: Bilateral;  . CYSTOSCOPY W/ URETERAL STENT REMOVAL Left 05/26/2015   Procedure: CYSTOSCOPY WITH STENT REMOVAL;  Surgeon: Festus Aloe, MD;  Location: [facility];  Service: Urology;  Laterality: Left;  . CYSTOSCOPY WITH RETROGRADE PYELOGRAM, URETEROSCOPY AND STENT PLACEMENT Left 05/01/2015   Procedure: CYSTOSCOPY WITH RETROGRADE PYELOGRAM AND STENT PLACEMENT;  Surgeon: Festus Aloe, MD;  Location: [facility];  Service: Urology;  Laterality: Left;  . CYSTOSCOPY WITH STENT PLACEMENT Bilateral 05/26/2015   Procedure: CYSTOSCOPY WITH  STENT PLACEMENT;  Surgeon: Festus Aloe, MD;  Location[facility];  Service: Urology;  Laterality: Bilateral;  . CYSTOSCOPY WITH URETEROSCOPY Bilateral 05/26/2015   Procedure: CYSTOSCOPY WITH URETEROSCOPY;  Surgeon: Festus Aloe, MD;  Location: [facility];  Service: Urology;  Laterality: Bilateral;  . CYSTOSCOPY/URETEROSCOPY/HOLMIUM LASER/STENT PLACEMENT Right 06/09/2015   Procedure: CYSTOSCOPY/URETEROSCOPY/STENT PLACEMENT REMOVAL LEFT URETERAL STENT;  Surgeon: Festus Aloe, MD;  Location: [facility];  Service: Urology;   Laterality: Right;  . DIAGNOSTIC LAPAROSCOPY    . HERNIA REPAIR    . HOLMIUM LASER APPLICATION Left 63/06/6008   Procedure: HOLMIUM LASER APPLICATION;  Surgeon: Festus Aloe, MD;  Location: [facility];  Service: Urology;  Laterality: Left;  . HOLMIUM LASER APPLICATION Right 93/23/5573   Procedure: HOLMIUM LASER APPLICATION;  Surgeon: Festus Aloe, MD;  Location: [facility];  Service: Urology;  Laterality: Right;  . INSERTION OF MESH N/A 09/05/2016   Procedure: INSERTION OF MESH;  Surgeon: [name], MD;  Location: [place];  Service: General;  Laterality: N/A;  . LAPAROSCOPIC ASSISTED VAGINAL HYSTERECTOMY  04-23-2002   and Left Salpingoophorectomy/  Anterior Repair/  Tension Free Tape Sling placement  . LASER ABLATION OF THE CERVIX  x2  1980's  . TUBAL LIGATION  1980's  . VENTRAL HERNIA REPAIR N/A 09/05/2016   Procedure: LAPAROSCOPIC VENTRAL HERNIA;  Surgeon: [name], MD;  Location: [place];  Service: General;  Laterality: N/A;    Family History  Problem Relation Age of Onset  . Alcohol abuse Mother   . Lung cancer Mother   . Colon cancer Mother   . Arthritis Father   . Hyperlipidemia Father   . Heart disease Father   . Hypertension Father   . Kidney disease Paternal Uncle   . Cancer Maternal Grandmother        colon  . Colon cancer Maternal Grandmother   . Rectal cancer Maternal Grandmother   . Cancer Paternal Grandmother        breast  . Esophageal cancer Neg Hx   . Stomach cancer Neg Hx     Social History   Socioeconomic History  . Marital status: Legally Separated    Spouse name: Not on file  . Number of children: Not on file  . Years of education: Not on file  . Highest education level: Not on file  Occupational History  . Not on file  Tobacco Use  . Smoking status: Never Smoker  . Smokeless tobacco: Never Used  Vaping Use  . Vaping Use: Never used  Substance and Sexual Activity  . Alcohol use: Never    Comment: 1 x year  . Drug  use: Never  . Sexual activity: Yes  Other Topics Concern  . Not on file  Social History Narrative  . Not on file   Social Determinants of Health   Financial Resource Strain: Not on file  Food Insecurity: Not on file  Transportation Needs: Not on file  Physical Activity: Not on file  Stress: Not on file  Social Connections: Not on file  Intimate Partner Violence: Not on file    Outpatient Medications Prior to Visit  Medication Sig Dispense Refill  . amphetamine-dextroamphetamine (ADDERALL) 30 MG tablet Take one tablet by mouth twice daily.  May refill in one month. 60 tablet 0  . amphetamine-dextroamphetamine (ADDERALL) 30 MG tablet Take one tablet twice daily. 60 tablet 0  . amphetamine-dextroamphetamine (ADDERALL) 30 MG tablet Take 1 tablet by mouth twice daily.  May refill in 2 months. 60 tablet 0  . atorvastatin (LIPITOR) 10 MG tablet TAKE 1 TABLET BY MOUTH DAILY 90 tablet 2  . Biotin 1000 MCG tablet Take 1,000 mcg by mouth 3 (three) times daily.    . citalopram (CELEXA) 20 MG tablet Take 1 tablet (20 mg total) by mouth daily. 90 tablet 3  . cyclobenzaprine (FLEXERIL) 10 MG tablet Take 10 mg by mouth 2 (two) times daily.    . furosemide (LASIX) 20 MG tablet TAKE 1 TABLET BY MOUTH ONCE A DAY AS NEEDED FOR SWELLING 30 tablet 1  . guaiFENesin-codeine 100-10 MG/5ML syrup Take 5-10 mLs by mouth at bedtime as needed for cough. 120 mL 0  . ibuprofen (ADVIL) 800 MG tablet Take 1 tablet (800 mg total) by mouth 3 (three) times daily. 21 tablet 0  . levothyroxine (SYNTHROID) 50 MCG tablet TAKE 1 TABLET(50 MCG) BY MOUTH DAILY BEFORE BREAKFAST 90 tablet 3  . methocarbamol (ROBAXIN) 500 MG tablet Take 1 tablet (500 mg total) by mouth every 6 (six) hours as needed for muscle spasms. 40 tablet 3  . nortriptyline (PAMELOR) 25 MG capsule Take by mouth.    . ondansetron (ZOFRAN-ODT) 8 MG disintegrating tablet Take 1 tablet (8 mg total) by mouth every 8 (eight) hours as needed for nausea or vomiting.  60 tablet 3  . oxyCODONE-acetaminophen (PERCOCET/ROXICET) 5-325 MG tablet Take 1-2 tablets by mouth every 4 (four) hours as needed for moderate pain or severe pain. 60 tablet 0  . pantoprazole (PROTONIX) 40 MG tablet TAKE 1 TABLET(40 MG) BY MOUTH TWICE DAILY 60 tablet 3  . pramipexole (MIRAPEX) 0.125 MG tablet TAKE 1-2 TABLET EVERY EVENING AS NEEDED FOR RESTLESS LEG SYNDROME 60 tablet 3  . SUMAtriptan (IMITREX) 100 MG tablet May repeat in 2 hours if headache persists or recurs.  Do not take more than 2 in 24 hours (Patient taking differently: Take 100 mg by mouth every 2 (two) hours as needed for migraine. May repeat in 2 hours if headache persists or recurs.  Do not take more than 2 in 24 hours) 10 tablet 11  . Vitamin D, Ergocalciferol, (DRISDOL) 1.25 MG (50000 UNIT) CAPS capsule TAKE 1 CAPSULE BY MOUTH 1 TIME A WEEK (Patient taking differently: Take 50,000 Units by mouth every Monday. TAKE 1 CAPSULE BY MOUTH 1 TIME A WEEK) 12 capsule 3  . losartan-hydrochlorothiazide (HYZAAR) 50-12.5 MG tablet TAKE 1 TABLET BY MOUTH DAILY 90 tablet 3  . Semaglutide,0.25 or 0.5MG /DOS, (OZEMPIC, 0.25 OR 0.5 MG/DOSE,) 2 MG/1.5ML SOPN Inject 0.375 mLs (0.5 mg total) into the skin once a week. 3 pen 3   No facility-administered medications prior to visit.    Allergies  Allergen Reactions  . Keflex [Cephalexin] Swelling    Tongue swelling    ROS Review of Systems  Constitutional: Negative for chills, fatigue and fever.  Eyes: Negative for visual disturbance.  Respiratory: Negative for cough, chest tightness, shortness of breath and wheezing.   Cardiovascular: Negative for chest pain, palpitations and leg swelling.  Endocrine: Negative for polydipsia and polyuria.  Musculoskeletal: Positive for arthralgias and back pain.  Neurological: Negative for dizziness, seizures, syncope, weakness, light-headedness and headaches.  Hematological: Negative for adenopathy. Bruises/bleeds easily.      Objective:     Physical Exam Vitals reviewed.  Constitutional:      Appearance: She is obese.  Cardiovascular:     Rate and Rhythm: Normal rate.  Pulmonary:     Effort: Pulmonary effort is normal.  Breath sounds: Normal breath sounds.  Musculoskeletal:     Right lower leg: No edema.     Left lower leg: No edema.  Skin:    Comments: Has multiple small bruises on her upper extremities including hands and forearms bilaterally as well as lower legs.  Neurological:     Mental Status: She is alert.     BP (!) 160/82 (BP Location: Left Arm, Patient Position: Sitting, Cuff Size: Normal)   Pulse (!) 120   Temp 98.1 F (36.7 C) (Oral)   Wt 276 lb 6.4 oz (125.4 kg)   SpO2 97%   BMI 47.82 kg/m  Wt Readings from Last 3 Encounters:  10/02/20 276 lb 6.4 oz (125.4 kg)  03/23/20 244 lb 9.6 oz (110.9 kg)  02/03/20 247 lb 2.2 oz (112.1 kg)     Health Maintenance Due  Topic Date Due  . COVID-19 Vaccine (2 - Pfizer 3-dose series) 07/22/2020    There are no preventive care reminders to display for this patient.  Lab Results  Component Value Date   TSH 2.37 03/23/2020   Lab Results  Component Value Date   WBC 9.6 03/23/2020   HGB 10.7 (L) 03/23/2020   HCT 35.8 03/23/2020   MCV 80.4 03/23/2020   PLT 477 (H) 03/23/2020   Lab Results  Component Value Date   NA 139 03/23/2020   K 3.4 (L) 03/23/2020   CO2 29 03/23/2020   GLUCOSE 131 (H) 03/23/2020   BUN 12 03/23/2020   CREATININE 0.99 03/23/2020   BILITOT 0.4 03/23/2020   ALKPHOS 138 (H) 03/22/2019   AST 17 03/23/2020   ALT 21 03/23/2020   PROT 6.8 03/23/2020   ALBUMIN 3.5 03/22/2019   CALCIUM 9.5 03/23/2020   ANIONGAP 7 02/04/2020   GFR 68.15 03/22/2019   Lab Results  Component Value Date   CHOL 156 03/23/2020   Lab Results  Component Value Date   HDL 45 (L) 03/23/2020   Lab Results  Component Value Date   LDLCALC 86 03/23/2020   Lab Results  Component Value Date   TRIG 155 (H) 03/23/2020   Lab Results  Component  Value Date   CHOLHDL 3.5 03/23/2020   Lab Results  Component Value Date   HGBA1C 6.5 (H) 01/30/2020      Assessment & Plan:   #1 morbid obesity.  She has multiple comorbidities as above.  She is in process of trying to get approved for gastric bypass.  She has tried multiple weight loss programs with limited success -We will complete forms for gastric bypass surgery  #2 hypertension poorly controlled -Increase Hyzaar to 100/12.5 mg 1 daily -Hopefully her blood pressure will improve significantly with weight loss  #3 easy bruising.  She does not take any antiplatelet agents. -Repeat CBC and check INR and PTT  #4 type 2 diabetes.  Previous A1c 6.5%.  Patient currently on Ozempic 0.5 mg daily.  Recheck A1c today.  We will try titration of Ozempic up to 1 mg daily hopefully to help with her weight loss as well.  Meds ordered this encounter  Medications  . losartan-hydrochlorothiazide (HYZAAR) 100-12.5 MG tablet    Sig: Take 1 tablet by mouth daily.    Dispense:  30 tablet    Refill:  5  . Semaglutide, 1 MG/DOSE, (OZEMPIC, 1 MG/DOSE,) 2 MG/1.5ML SOPN    Sig: Inject 1 mg into the skin once a week.    Dispense:  3 mL    Refill:  5  Follow-up: Return in about 1 month (around 11/01/2020).    Carolann Littler, MD

## 2020-10-05 ENCOUNTER — Encounter: Payer: Self-pay | Admitting: Physician Assistant

## 2020-10-05 ENCOUNTER — Encounter: Payer: Self-pay | Admitting: Skilled Nursing Facility1

## 2020-10-05 ENCOUNTER — Other Ambulatory Visit: Payer: Self-pay

## 2020-10-05 ENCOUNTER — Ambulatory Visit: Payer: Medicare Other | Admitting: Physician Assistant

## 2020-10-05 ENCOUNTER — Encounter: Payer: Medicare Other | Attending: Surgery | Admitting: Skilled Nursing Facility1

## 2020-10-05 VITALS — HR 105 | Ht 64.0 in | Wt 273.0 lb

## 2020-10-05 DIAGNOSIS — M5416 Radiculopathy, lumbar region: Secondary | ICD-10-CM | POA: Diagnosis not present

## 2020-10-05 DIAGNOSIS — E1165 Type 2 diabetes mellitus with hyperglycemia: Secondary | ICD-10-CM | POA: Diagnosis not present

## 2020-10-05 DIAGNOSIS — R935 Abnormal findings on diagnostic imaging of other abdominal regions, including retroperitoneum: Secondary | ICD-10-CM | POA: Diagnosis not present

## 2020-10-05 NOTE — Progress Notes (Signed)
Nutrition Assessment for Bariatric Surgery Medical Nutrition Therapy Appt Start Time: 10:23 End Time: 11:23 am  Patient was seen on 10/05/2020 for Pre-Operative Nutrition Assessment. Letter of approval faxed to [facility] bariatric surgery program coordinator on 10/05/2020  Referral stated Supervised Weight Loss (SWL) visits needed: 6  Planned surgery: Sleeve gastrectomy  Pt expectation of surgery: to be healthy Pt expectation of dietitian: to help educate    NUTRITION ASSESSMENT   Anthropometrics  Start weight at [facility]: 274.1 lbs (date: 10/05/2020)  Height: 64 in BMI: 47.05 kg/m2     Clinical  Medical hx: GERD, kidney stones, diabetes, restless legs, IBS Medications: vitamin D, ozempic, biotin, b12 gummies, collagen, tumeric, allign  Labs: HbA1c of 6.9 on 10/02/20 Notable signs/symptoms: nausea, migraines Any previous deficiencies? Vitamin D  Micronutrient Nutrition Focused Physical Exam: Hair: No issues observed Eyes: No issues observed Mouth: No issues observed Neck: No issues observed Nails: No issues observed Skin: No issues observed  Lifestyle & Dietary Hx  Pt states she is seeing gastroenterolgist today. Pt stated she did a barium swallow. Pt states she has always had trouble absorbing stuff. Pt states she does have depression and anxiety stating some days are better than others. Pt states she does wear her C-PAP nightly.  Pt states she lives with her husabd and father who is a cause of stress because she takes care of him which has several ailments she also takes care of her brother which also has aliments.  Pt states her friend and husband and sister are very supportive. Pt stae she cannot stand for long so her husbands makes the meals or they buy their meals.   Pt staets she goes through phases of craving sweets.  Pt state she turns to sweet tea when she is stressed or has other ailments.   24-Hr Dietary Recall First Meal:  Snack: granola Second  Meal 1pm: eaten out Snack: nuts Third Meal: eaten out Snack: cookie Beverages: water, sweet tea, soda, coffee + flavored cream + sugar   Estimated Energy Needs Calories: 1500   NUTRITION DIAGNOSIS  Overweight/obesity ([place]-3.3) related to past poor dietary habits and physical inactivity as evidenced by patient w/ planned sleeve gastrectomy surgery following dietary guidelines for continued weight loss.    NUTRITION INTERVENTION  Nutrition counseling (C-1) and education (E-2) to facilitate bariatric surgery goals. Educated pt on these changes having a positive impact on her blood sugars.    Pre-Op Goals Reviewed with the Patient . Track food and beverage intake (pen and paper, MyFitness Pal, Baritastic app, etc.) . Make healthy food choices while monitoring portion sizes . Consume 3 meals per day or try to eat every 3-5 hours . Avoid concentrated sugars and fried foods . Keep sugar & fat in the single digits per serving on food labels . Practice CHEWING your food (aim for applesauce consistency) . Practice not drinking 15 minutes before, during, and 30 minutes after each meal and snack . Avoid all carbonated beverages (ex: soda, sparkling beverages)  . Limit caffeinated beverages (ex: coffee, tea, energy drinks) . Avoid all sugar-sweetened beverages (ex: regular soda, sports drinks)  . Avoid alcohol  . Aim for 64-100 ounces of FLUID daily (with at least half of fluid intake being plain water)  . Aim for at least 60-80 grams of PROTEIN daily . Look for a liquid protein source that contains ?15 g protein and ?5 g carbohydrate (ex: shakes, drinks, shots) . Make a list of non-food related activities . Physical activity  is an important part of a healthy lifestyle so keep it moving! The goal is to reach 150 minutes of exercise per week, including cardiovascular and weight baring activity.  *Goals that are bolded indicate the pt would like to start working towards these  Handouts Provided  Include  . Bariatric Surgery handouts (Nutrition Visits, Pre-Op Goals, Protein Shakes, Vitamins & Minerals)  Learning Style & Readiness for Change Teaching method utilized: Visual & Auditory  Demonstrated degree of understanding via: Teach Back  Readiness Level: contemplation Barriers to learning/adherence to lifestyle change: stress as caregiver for father and step brother.  RD's Notes for Next Visit . Assess pt's adherence to chosen goals     MONITORING & EVALUATION Dietary intake, weekly physical activity, body weight, and pre-op goals reached at next nutrition visit.    Next Steps  Patient is to follow up at [facility] for Pre-Op Class >2 weeks before surgery for further nutrition education.  Return for next SWL appt.

## 2020-10-05 NOTE — Patient Instructions (Signed)
If you are age 58 or older, your body mass index should be between 23-30. Your Body mass index is 46.86 kg/m. If this is out of the aforementioned range listed, please consider follow up with your Primary Care Provider.  If you are age 18 or younger, your body mass index should be between 19-25. Your Body mass index is 46.86 kg/m. If this is out of the aformentioned range listed, please consider follow up with your Primary Care Provider.   Thank you for choosing me and [facility].  Ellouise Newer, PA-C

## 2020-10-05 NOTE — Progress Notes (Signed)
Chief Complaint: Gastroparesis  HPI:    [name] Sherman is a 60 year old female with past medical history as listed below including cervical cancer, reported IBS and arthritis, known to Dr. Havery Moros, who presents to clinic today with a complaint of gastroparesis.      05/03/2019 patient seen in clinic by Dr. Havery Moros for 6 years of chronic discomfort in her mid abdomen.  Described ventral hernia repair x2 in 2018 which is thought to be the source of her discomfort.  Described as an achiness.  Also described nausea.  Also discussed IBS with frequent bowel movements.  CT abdomen pelvis 06/15/2018 with hepatic steatosis and mild dependent megaly as well as questionable mild thickening of the fundus and focal adenomyomatosis.  It was recommended she have an EGD.  Her Protonix was also increased to twice daily and her Zofran increased to 8 mg.  Was discussed that they could consider trigger point injections to the abdominal wall and potentially adding Cymbalta or TCA if there was no cause found on EGD.  Also scheduled for screening colonoscopy.    05/07/2019 EGD and colonoscopy.  Colonoscopy with 2 polyps and internal hemorrhoids. EGD with 1 cm sliding hiatal hernia and otherwise normal.  Biopsies were all normal.  Recommended repeat colonoscopy in 7 years for surveillance.  It was discussed that there is no cause for her symptoms found.  It was explained that she likely had musculoskeletal/neuropathic pain.  She was offered a trial of Cymbalta or Elavil, but was on Celexa at the time..  Patient said she was going to contact her PCP about changing her to Cymbalta or Elavil.  If this did not help and it was recommended she be referred to pain management.    09/08/2020 upper GI series showed limited single contrast exam demonstrating retained gastric contents and failure to empty through the pylorus.  There is discussed the findings may suggest gastroparesis.    10/02/2020 CBC with a white count of 13.1, hemoglobin  11.7 (seems better than baseline).  Hemoglobin A1c 6.9.    Today, the patient presents to clinic and explains that she is trying to have a gastric sleeve placed later this year for weight loss.  She recently had an upper GI series which showed that she may have gastroparesis.  Patient is worried that this will mean that she cannot have her weight loss surgery in the future.  Tells me that she sometimes has reflux symptoms but they are pretty well controlled on her Pantoprazole 40 mg twice a day.  Describes her chronic abdominal pain and tells me she is on a treatment with Nortriptyline and Cyclobenzaprine which seems to help the most that "anything can".  She is unsure if the surgeons are wanting a repeat EGD prior to her procedure.    Denies fever, chills, weight loss, blood in her stool or change in bowel habits.  Past Medical History:  Diagnosis Date  . ADD (attention deficit disorder)   . Anemia   . Anxiety   . Arthritis    shoulder, right (arthritis, tendonitis, bursitis)  . Cancer ([place])    cervical cancer ([place])   . Chronic kidney disease   . Complication of anesthesia   . Depression   . Dyspnea 2020  . GERD (gastroesophageal reflux disease)   . History of cervical cancer    s/p  laser ablation of cervix 1980's  . History of kidney stones   . Hyperlipidemia   . Hypertension   . Hypothyroidism   .  IBS (irritable bowel syndrome)   . Irregular heart rate    as a child, has never had any problems   . Kidney stones   . Left ureteral stone   . Migraine   . OSA (obstructive sleep apnea)    pt used cpap up until 2013 states lost wt and did not need anymore  . PONV (postoperative nausea and vomiting)   . Pre-diabetes   . Restless legs   . Sleep apnea    wears CPAP  . Umbilical hernia without obstruction or gangrene   . Urgency of urination   . Vertigo   . Vitamin D deficiency     Past Surgical History:  Procedure Laterality Date  . ABDOMINAL HYSTERECTOMY    . BACK  SURGERY  04/27/2018  . BUNIONECTOMY Right 2004  . COLONOSCOPY    . CYSTOSCOPY W/ RETROGRADES Bilateral 05/26/2015   Procedure: CYSTOSCOPY WITH RETROGRADE PYELOGRAM;  Surgeon: Festus Aloe, MD;  Location: [facility];  Service: Urology;  Laterality: Bilateral;  . CYSTOSCOPY W/ URETERAL STENT REMOVAL Left 05/26/2015   Procedure: CYSTOSCOPY WITH STENT REMOVAL;  Surgeon: Festus Aloe, MD;  Location: [facility];  Service: Urology;  Laterality: Left;  . CYSTOSCOPY WITH RETROGRADE PYELOGRAM, URETEROSCOPY AND STENT PLACEMENT Left 05/01/2015   Procedure: CYSTOSCOPY WITH RETROGRADE PYELOGRAM AND STENT PLACEMENT;  Surgeon: Festus Aloe, MD;  Location: [facility];  Service: Urology;  Laterality: Left;  . CYSTOSCOPY WITH STENT PLACEMENT Bilateral 05/26/2015   Procedure: CYSTOSCOPY WITH STENT PLACEMENT;  Surgeon: Festus Aloe, MD;  Location[facility];  Service: Urology;  Laterality: Bilateral;  . CYSTOSCOPY WITH URETEROSCOPY Bilateral 05/26/2015   Procedure: CYSTOSCOPY WITH URETEROSCOPY;  Surgeon: Festus Aloe, MD;  Location: [place];  Service: Urology;  Laterality: Bilateral;  . CYSTOSCOPY/URETEROSCOPY/HOLMIUM LASER/STENT PLACEMENT Right 06/09/2015   Procedure: CYSTOSCOPY/URETEROSCOPY/STENT PLACEMENT REMOVAL LEFT URETERAL STENT;  Surgeon: Festus Aloe, MD;  Location: [facility];  Service: Urology;  Laterality: Right;  . DIAGNOSTIC LAPAROSCOPY    . HERNIA REPAIR    . HOLMIUM LASER APPLICATION Left 56/38/7564   Procedure: HOLMIUM LASER APPLICATION;  Surgeon: Festus Aloe, MD;  Location: [facility];  Service: Urology;  Laterality: Left;  . HOLMIUM LASER APPLICATION Right 33/29/5188   Procedure: HOLMIUM LASER APPLICATION;  Surgeon: Festus Aloe, MD;  Location: [facility];  Service: Urology;  Laterality: Right;  . INSERTION OF MESH N/A 09/05/2016   Procedure: INSERTION OF MESH;  Surgeon: [name], MD;   Location: [place];  Service: General;  Laterality: N/A;  . LAPAROSCOPIC ASSISTED VAGINAL HYSTERECTOMY  04-23-2002   and Left Salpingoophorectomy/  Anterior Repair/  Tension Free Tape Sling placement  . LASER ABLATION OF THE CERVIX  x2  1980's  . TUBAL LIGATION  1980's  . VENTRAL HERNIA REPAIR N/A 09/05/2016   Procedure: LAPAROSCOPIC VENTRAL HERNIA;  Surgeon: [name], MD;  Location: [place];  Service: General;  Laterality: N/A;    Current Outpatient Medications  Medication Sig Dispense Refill  . amphetamine-dextroamphetamine (ADDERALL) 30 MG tablet Take one tablet by mouth twice daily.  May refill in one month. 60 tablet 0  . amphetamine-dextroamphetamine (ADDERALL) 30 MG tablet Take one tablet twice daily. 60 tablet 0  . amphetamine-dextroamphetamine (ADDERALL) 30 MG tablet Take 1 tablet by mouth twice daily.  May refill in 2 months. 60 tablet 0  . atorvastatin (LIPITOR) 10 MG tablet TAKE 1 TABLET BY MOUTH DAILY 90 tablet 2  . Biotin 1000 MCG tablet  Take 1,000 mcg by mouth 3 (three) times daily.    . citalopram (CELEXA) 20 MG tablet Take 1 tablet (20 mg total) by mouth daily. 90 tablet 3  . cyclobenzaprine (FLEXERIL) 10 MG tablet Take 10 mg by mouth 2 (two) times daily.    . furosemide (LASIX) 20 MG tablet TAKE 1 TABLET BY MOUTH ONCE A DAY AS NEEDED FOR SWELLING 30 tablet 1  . guaiFENesin-codeine 100-10 MG/5ML syrup Take 5-10 mLs by mouth at bedtime as needed for cough. 120 mL 0  . ibuprofen (ADVIL) 800 MG tablet Take 1 tablet (800 mg total) by mouth 3 (three) times daily. 21 tablet 0  . levothyroxine (SYNTHROID) 50 MCG tablet TAKE 1 TABLET(50 MCG) BY MOUTH DAILY BEFORE BREAKFAST 90 tablet 3  . losartan-hydrochlorothiazide (HYZAAR) 100-12.5 MG tablet Take 1 tablet by mouth daily. 30 tablet 5  . methocarbamol (ROBAXIN) 500 MG tablet Take 1 tablet (500 mg total) by mouth every 6 (six) hours as needed for muscle spasms. 40 tablet 3  . nortriptyline (PAMELOR) 25 MG capsule Take by mouth.     . ondansetron (ZOFRAN-ODT) 8 MG disintegrating tablet Take 1 tablet (8 mg total) by mouth every 8 (eight) hours as needed for nausea or vomiting. 60 tablet 3  . oxyCODONE-acetaminophen (PERCOCET/ROXICET) 5-325 MG tablet Take 1-2 tablets by mouth every 4 (four) hours as needed for moderate pain or severe pain. 60 tablet 0  . pantoprazole (PROTONIX) 40 MG tablet TAKE 1 TABLET(40 MG) BY MOUTH TWICE DAILY 60 tablet 3  . pramipexole (MIRAPEX) 0.125 MG tablet TAKE 1-2 TABLET EVERY EVENING AS NEEDED FOR RESTLESS LEG SYNDROME 60 tablet 3  . Semaglutide, 1 MG/DOSE, (OZEMPIC, 1 MG/DOSE,) 2 MG/1.5ML SOPN Inject 1 mg into the skin once a week. 3 mL 5  . SUMAtriptan (IMITREX) 100 MG tablet May repeat in 2 hours if headache persists or recurs.  Do not take more than 2 in 24 hours (Patient taking differently: Take 100 mg by mouth every 2 (two) hours as needed for migraine. May repeat in 2 hours if headache persists or recurs.  Do not take more than 2 in 24 hours) 10 tablet 11  . Vitamin D, Ergocalciferol, (DRISDOL) 1.25 MG (50000 UNIT) CAPS capsule TAKE 1 CAPSULE BY MOUTH 1 TIME A WEEK (Patient taking differently: Take 50,000 Units by mouth every Monday. TAKE 1 CAPSULE BY MOUTH 1 TIME A WEEK) 12 capsule 3   No current facility-administered medications for this visit.    Allergies as of 10/05/2020 - Review Complete 10/05/2020  Allergen Reaction Noted  . Keflex [cephalexin] Swelling 08/16/2016    Family History  Problem Relation Age of Onset  . Alcohol abuse Mother   . Lung cancer Mother   . Colon cancer Mother   . Arthritis Father   . Hyperlipidemia Father   . Heart disease Father   . Hypertension Father   . Kidney disease Paternal Uncle   . Cancer Maternal Grandmother        colon  . Colon cancer Maternal Grandmother   . Rectal cancer Maternal Grandmother   . Cancer Paternal Grandmother        breast  . Esophageal cancer Neg Hx   . Stomach cancer Neg Hx     Social History   Socioeconomic  History  . Marital status: Legally Separated    Spouse name: Not on file  . Number of children: Not on file  . Years of education: Not on file  . Highest education level:  Not on file  Occupational History  . Not on file  Tobacco Use  . Smoking status: Never Smoker  . Smokeless tobacco: Never Used  Vaping Use  . Vaping Use: Never used  Substance and Sexual Activity  . Alcohol use: Never    Comment: 1 x year  . Drug use: Never  . Sexual activity: Yes  Other Topics Concern  . Not on file  Social History Narrative  . Not on file   Social Determinants of Health   Financial Resource Strain: Not on file  Food Insecurity: Not on file  Transportation Needs: Not on file  Physical Activity: Not on file  Stress: Not on file  Social Connections: Not on file  Intimate Partner Violence: Not on file    Review of Systems:    Constitutional: No weight loss, fever or chills Cardiovascular: No chest pain Respiratory: No SOB Gastrointestinal: See HPI and otherwise negative   Physical Exam:  Vital signs: Pulse (!) 105   Ht 5\' 4"  (1.626 m)   Wt 273 lb (123.8 kg)   SpO2 90%   BMI 46.86 kg/m   Constitutional:   Pleasant obese Caucasian female appears to be in NAD, Well developed, Well nourished, alert and cooperative Respiratory: Respirations even and unlabored. Lungs clear to auscultation bilaterally.   No wheezes, crackles, or rhonchi.  Cardiovascular: Normal S1, S2. No MRG. Regular rate and rhythm. No peripheral edema, cyanosis or pallor.  Gastrointestinal:  Soft, nondistended, mild ttp to the mid abdomen. No rebound or guarding. Normal bowel sounds. No appreciable masses or hepatomegaly. Rectal:  Not performed.  Psychiatric:  Demonstrates good judgement and reason without abnormal affect or behaviors.  RELEVANT LABS AND IMAGING: CBC    Component Value Date/Time   WBC 13.1 (H) 10/02/2020 1208   RBC 4.69 10/02/2020 1208   HGB 11.7 (L) 10/02/2020 1208   HCT 36.5 10/02/2020 1208    PLT 460.0 (H) 10/02/2020 1208   MCV 77.9 (L) 10/02/2020 1208   MCH 24.0 (L) 03/23/2020 0943   MCHC 32.0 10/02/2020 1208   RDW 15.9 (H) 10/02/2020 1208   LYMPHSABS 2.7 10/02/2020 1208   MONOABS 0.6 10/02/2020 1208   EOSABS 0.1 10/02/2020 1208   BASOSABS 0.1 10/02/2020 1208    CMP     Component Value Date/Time   NA 139 03/23/2020 0943   K 3.4 (L) 03/23/2020 0943   CL 102 03/23/2020 0943   CO2 29 03/23/2020 0943   GLUCOSE 131 (H) 03/23/2020 0943   BUN 12 03/23/2020 0943   CREATININE 0.99 03/23/2020 0943   CALCIUM 9.5 03/23/2020 0943   PROT 6.8 03/23/2020 0943   ALBUMIN 3.5 03/22/2019 1459   AST 17 03/23/2020 0943   ALT 21 03/23/2020 0943   ALKPHOS 138 (H) 03/22/2019 1459   BILITOT 0.4 03/23/2020 0943   GFRNONAA >60 02/04/2020 0627   GFRAA >60 02/04/2020 6384    Assessment: 1.  Abnormal GI imaging: Upper GI series recently showed gastroparesis, this could be related to [place]: 1.  Discussed with patient that Ozempic may be the cause of her delayed gastric emptying.  There are some articles in regards to this.  We will ask Dr. Doyne Keel opinion.  It may be worth switching her diabetic medication to see if this clears up. 2.  Will also ask Dr. Doyne Keel opinion in regards to repeat EGD, likely they will want this done if they see an abnormal upper GI series. 3.  Patient to follow in clinic per recommendations  after my discussion with Dr. Havery Moros.  [name] Newer, PA-C [facility] 10/05/2020, 3:07 PM  Cc: [name], MD

## 2020-10-05 NOTE — Progress Notes (Signed)
Agree with assessment with the following thoughts: - relatively recent normal EGD, I think yield of repeating that will be low - if there is concern about possible gastroparesis, would recommend formal gastric emptying study to evaluate this if needed to clear her stomach pre-operatively. Radiology would tell her which medications to hold. Agree possible the Ozempic could be related and may need to be held prior to gastric emptying study. Thanks

## 2020-10-06 ENCOUNTER — Telehealth: Payer: Self-pay

## 2020-10-06 DIAGNOSIS — R110 Nausea: Secondary | ICD-10-CM

## 2020-10-06 DIAGNOSIS — R1084 Generalized abdominal pain: Secondary | ICD-10-CM

## 2020-10-06 DIAGNOSIS — K219 Gastro-esophageal reflux disease without esophagitis: Secondary | ICD-10-CM

## 2020-10-06 DIAGNOSIS — K3184 Gastroparesis: Secondary | ICD-10-CM

## 2020-10-06 DIAGNOSIS — R935 Abnormal findings on diagnostic imaging of other abdominal regions, including retroperitoneum: Secondary | ICD-10-CM

## 2020-10-06 NOTE — Telephone Encounter (Signed)
Patient has been scheduled for a Gastric emptying scan at [facility] on Wednesday, 10/28/20 at 7:30 AM. NPO after midnight the night before. Hold stomach meds 48 hours prior. Patient verbalized understanding and had no concerns at the end of the call.   Per Brittney in Nuclear med, there is no need to hold Ozempic prior to GES.

## 2020-10-06 NOTE — Telephone Encounter (Signed)
-----   Message from Levin Erp, [place] sent at 10/06/2020  8:36 AM EDT ----- Regarding: see addendum See addendum to my note, can you schedule her for gastric emptying study- please ask radiology if they recommend holding OZempic... Dr. Havery Moros seems to think may need to hold a week or two.  Thanks-JLL ----- Message ----- From: Yetta Flock, MD Sent: 10/05/2020  10:28 PM EDT To: Levin Erp, PA    ----- Message ----- From: Levin Erp, [place] Sent: 10/05/2020   3:59 PM EDT To: Yetta Flock, MD

## 2020-10-19 ENCOUNTER — Other Ambulatory Visit: Payer: Self-pay | Admitting: Family Medicine

## 2020-10-20 ENCOUNTER — Encounter: Payer: Self-pay | Admitting: Family Medicine

## 2020-10-20 MED ORDER — LEVOTHYROXINE SODIUM 50 MCG PO TABS: 50.0000 ug | ORAL_TABLET | Freq: Every day | ORAL | 0 refills | Status: DC

## 2020-10-24 ENCOUNTER — Other Ambulatory Visit: Payer: Self-pay | Admitting: Family Medicine

## 2020-10-26 DIAGNOSIS — M961 Postlaminectomy syndrome, not elsewhere classified: Secondary | ICD-10-CM | POA: Diagnosis not present

## 2020-10-26 DIAGNOSIS — M5136 Other intervertebral disc degeneration, lumbar region: Secondary | ICD-10-CM | POA: Diagnosis not present

## 2020-10-26 DIAGNOSIS — M48062 Spinal stenosis, lumbar region with neurogenic claudication: Secondary | ICD-10-CM | POA: Diagnosis not present

## 2020-10-26 MED ORDER — AMPHETAMINE-DEXTROAMPHETAMINE 30 MG PO TABS
ORAL_TABLET | ORAL | 0 refills | Status: DC
Start: 2020-10-26 — End: 2021-01-25

## 2020-10-26 NOTE — Telephone Encounter (Signed)
Last filled 07/21/2020 Last OV 10/02/2020 ( acute)

## 2020-10-26 NOTE — Telephone Encounter (Signed)
Last filled 07/21/2020 Last OV 10/02/2020 Ok to fill?

## 2020-10-28 ENCOUNTER — Encounter (HOSPITAL_COMMUNITY): Admission: RE | Admit: 2020-10-28 | Payer: Medicare Other | Source: Ambulatory Visit

## 2020-10-29 ENCOUNTER — Encounter: Payer: Self-pay | Admitting: Family Medicine

## 2020-10-30 ENCOUNTER — Other Ambulatory Visit: Payer: Self-pay

## 2020-10-30 DIAGNOSIS — R233 Spontaneous ecchymoses: Secondary | ICD-10-CM

## 2020-10-30 DIAGNOSIS — R238 Other skin changes: Secondary | ICD-10-CM

## 2020-11-04 ENCOUNTER — Encounter: Payer: Medicare Other | Attending: Surgery | Admitting: Skilled Nursing Facility1

## 2020-11-04 ENCOUNTER — Other Ambulatory Visit: Payer: Self-pay

## 2020-11-04 ENCOUNTER — Encounter: Payer: Self-pay | Admitting: Family Medicine

## 2020-11-04 DIAGNOSIS — R635 Abnormal weight gain: Secondary | ICD-10-CM

## 2020-11-04 DIAGNOSIS — E119 Type 2 diabetes mellitus without complications: Secondary | ICD-10-CM | POA: Diagnosis not present

## 2020-11-04 NOTE — Progress Notes (Signed)
Supervised Weight Loss Visit Bariatric Nutrition Education  Planned Surgery: sleeve  1 out of 6 SWL Appointments   NUTRITION ASSESSMENT   Anthropometrics  Start weight at [facility]: 274.1 lbs (date: 10/05/2020)  Weight: 272.6 lbs Height: 64 in BMI: 46.79 kg/m2     Clinical  Medical hx: GERD, kidney stones, diabetes, restless legs, IBS Medications: vitamin D, ozempic, biotin, b12 gummies, collagen, tumeric, allign  Labs: HbA1c of 6.9 on 10/02/20 Notable signs/symptoms: nausea, migraines Any previous deficiencies? Vitamin D  Lifestyle & Dietary Hx  Pt states she did drink beer for cinco de mayo. Pt states she has bene working on drinking more water but doe snot feel that has been as successful. Pt states she recognizes not drinking enough gives her cramps and constipation.  Pt state she has been working on cutting out snack foods such as candies.  Pt state she has been working on moving more with intention.   Pt states she does not check her blood sugar.   Estimated daily fluid intake: unknown oz Supplements:  Current average weekly physical activity: trying to move a little more   24-Hr Dietary Recall First Meal: granola bar + coffee or strawberry pancakes + eggs + cheese Snack:  Second Meal: cornbread + buttermilk or chicken salad chic Snack: candy Third Meal: eaten out: salad  Snack:  Beverages: coffee + cream and sugar  Estimated Energy Needs Calories: 1500   NUTRITION DIAGNOSIS  Overweight/obesity ([place]-3.3) related to past poor dietary habits and physical inactivity as evidenced by patient w/ planned sleeve gastrectomy surgery following dietary guidelines for continued weight loss.  NUTRITION INTERVENTION  Nutrition counseling (C-1) and education (E-2) to facilitate bariatric surgery goals. Educated pt on lifestyle changes correcting blood sugars.   Pre-Op Goals Progress & New Goals . Continue: Practice CHEWING your food (aim for applesauce consistency) . Continue:  Practice not drinking 15 minutes before, during, and 30 minutes after each meal and snack . Continue: Aim for 64-100 ounces of FLUID daily (with at least half of fluid intake being plain water)  . NEW: avoid alcohol . NEW: cut back on sugar in coffee; choose sugar free creamer instead  . NEW: drink one 32 ounces bottle by 12 and then the next 32 ounce by 4pm  Handouts Provided Include     Learning Style & Readiness for Change Teaching method utilized: Visual & Auditory  Demonstrated degree of understanding via: Teach Back  Readiness Level: contemplative  Barriers to learning/adherence to lifestyle change: unknown at this time  RD's Notes for next Visit  . Assess pts adherence to chosen goals   MONITORING & EVALUATION Dietary intake, weekly physical activity, body weight, and pre-op goals in 1 month.   Next Steps  Patient is to return to [facility] fro next [facility]

## 2020-11-11 ENCOUNTER — Other Ambulatory Visit: Payer: Self-pay

## 2020-11-11 ENCOUNTER — Other Ambulatory Visit: Payer: Self-pay | Admitting: Family Medicine

## 2020-11-11 MED ORDER — BLOOD GLUCOSE METER KIT: PACK | 0 refills | Status: DC

## 2020-11-16 MED ORDER — GLUCOSE BLOOD VI STRP
ORAL_STRIP | 12 refills | Status: DC
Start: 2020-11-16 — End: 2021-08-27

## 2020-11-16 MED ORDER — ACCU-CHEK SOFTCLIX LANCETS MISC: 12 refills | Status: DC

## 2020-11-30 NOTE — Addendum Note (Signed)
Addended by: Rebecca Eaton on: 11/30/2020 02:52 PM   Modules accepted: Orders

## 2020-12-02 ENCOUNTER — Other Ambulatory Visit: Payer: Medicare Other

## 2020-12-04 ENCOUNTER — Other Ambulatory Visit: Payer: Self-pay

## 2020-12-04 ENCOUNTER — Ambulatory Visit (INDEPENDENT_AMBULATORY_CARE_PROVIDER_SITE_OTHER): Payer: Medicare Other | Admitting: Family Medicine

## 2020-12-04 VITALS — BP 144/82 | HR 120 | Temp 98.0°F | Wt 280.3 lb

## 2020-12-04 DIAGNOSIS — R233 Spontaneous ecchymoses: Secondary | ICD-10-CM

## 2020-12-04 DIAGNOSIS — R238 Other skin changes: Secondary | ICD-10-CM | POA: Diagnosis not present

## 2020-12-04 DIAGNOSIS — E039 Hypothyroidism, unspecified: Secondary | ICD-10-CM

## 2020-12-04 DIAGNOSIS — R140 Abdominal distension (gaseous): Secondary | ICD-10-CM

## 2020-12-04 DIAGNOSIS — R635 Abnormal weight gain: Secondary | ICD-10-CM

## 2020-12-04 MED ORDER — DEXAMETHASONE 1 MG PO TABS: 1.0000 mg | ORAL_TABLET | Freq: Every day | ORAL | 0 refills | Status: DC

## 2020-12-04 NOTE — Progress Notes (Signed)
Established Patient Office Visit  Subjective:  Patient ID: [name] Sherman, female    DOB: 1961/01/22  Age: 60 y.o. MRN: [id number]  CC: No chief complaint on file.   HPI [name] Sherman presents for concerns for weight gain, fatigue, easy bruising.  She has had some difficulties with her back for the past couple years with previous back surgery.  She had very prolonged period of inactivity related to back and also had fairly severe COVID infection.  At first she thought her weight gain was related to inactivity but she has continued to gain weight and has developed more cushingoid type features here recently.  She does have chronic obesity but has had increased fluid retention and change in distribution of weight recently.  She states her pant size has gone up two sizes just in the past month or so.  She has chronic problems including hypertension, history of migraine headaches, obstructive sleep apnea, hypothyroidism, type 2 diabetes, kidney stones, ADD, hyperlipidemia  Recent A1c 6.9%.  She is currently taking Ozempic 1 mg once a week and in spite of that has still gained weight.  She is on thyroid replacement with levothyroxine 50 mcg daily.  Hypertension treated with losartan HCTZ.  Compliant with medications.  Patient has had multiple steroid injections per orthopedics and she initially thought a lot of her weight gain was related to that.  She also complains of diffuse abdominal pain and abdominal bloating.  She has had previous hysterectomy but states she has 1 ovary still in place.  Past Medical History:  Diagnosis Date   ADD (attention deficit disorder)    Anemia    Anxiety    Arthritis    shoulder, right (arthritis, tendonitis, bursitis)   Cancer (HCC)    cervical cancer (conization)    Chronic kidney disease    Complication of anesthesia    COVID-19 07/2019   Depression    Dyspnea 2020   GERD (gastroesophageal reflux disease)    History of cervical cancer    s/p   laser ablation of cervix 1980's   History of kidney stones    Hyperlipidemia    Hypertension    Hypothyroidism    IBS (irritable bowel syndrome)    Irregular heart rate    as a child, has never had any problems    Kidney stones    Left ureteral stone    Migraine    OSA (obstructive sleep apnea)    pt used cpap up until 2013 states lost wt and did not need anymore   PONV (postoperative nausea and vomiting)    Pre-diabetes    Restless legs    Sleep apnea    wears CPAP   Umbilical hernia without obstruction or gangrene    Urgency of urination    Vertigo    Vitamin D deficiency     Past Surgical History:  Procedure Laterality Date   ABDOMINAL HYSTERECTOMY     BACK SURGERY  04/27/2018   and in 2021 (Fusion)   BUNIONECTOMY Right 2004   COLONOSCOPY     CYSTOSCOPY W/ RETROGRADES Bilateral 05/26/2015   Procedure: CYSTOSCOPY WITH RETROGRADE PYELOGRAM;  Surgeon: Festus Aloe, MD;  Location: [facility];  Service: Urology;  Laterality: Bilateral;   CYSTOSCOPY W/ URETERAL STENT REMOVAL Left 05/26/2015   Procedure: CYSTOSCOPY WITH STENT REMOVAL;  Surgeon: Festus Aloe, MD;  Location: [facility];  Service: Urology;  Laterality: Left;   CYSTOSCOPY WITH RETROGRADE PYELOGRAM, URETEROSCOPY AND STENT PLACEMENT  Left 05/01/2015   Procedure: CYSTOSCOPY WITH RETROGRADE PYELOGRAM AND STENT PLACEMENT;  Surgeon: Festus Aloe, MD;  Location: [facility];  Service: Urology;  Laterality: Left;   CYSTOSCOPY WITH STENT PLACEMENT Bilateral 05/26/2015   Procedure: CYSTOSCOPY WITH STENT PLACEMENT;  Surgeon: Festus Aloe, MD;  Location: [facility];  Service: Urology;  Laterality: Bilateral;   CYSTOSCOPY WITH URETEROSCOPY Bilateral 05/26/2015   Procedure: CYSTOSCOPY WITH URETEROSCOPY;  Surgeon: Festus Aloe, MD;  Location: [facility];  Service: Urology;  Laterality: Bilateral;   CYSTOSCOPY/URETEROSCOPY/HOLMIUM LASER/STENT PLACEMENT  Right 06/09/2015   Procedure: CYSTOSCOPY/URETEROSCOPY/STENT PLACEMENT REMOVAL LEFT URETERAL STENT;  Surgeon: Festus Aloe, MD;  Location: [facility];  Service: Urology;  Laterality: Right;   DIAGNOSTIC LAPAROSCOPY     HERNIA REPAIR     HOLMIUM LASER APPLICATION Left 31/49/7026   Procedure: HOLMIUM LASER APPLICATION;  Surgeon: Festus Aloe, MD;  Location: [facility];  Service: Urology;  Laterality: Left;   HOLMIUM LASER APPLICATION Right 37/85/8850   Procedure: HOLMIUM LASER APPLICATION;  Surgeon: Festus Aloe, MD;  Location: [facility];  Service: Urology;  Laterality: Right;   INSERTION OF MESH N/A 09/05/2016   Procedure: INSERTION OF MESH;  Surgeon: [name], MD;  Location: [place];  Service: General;  Laterality: N/A;   LAPAROSCOPIC ASSISTED VAGINAL HYSTERECTOMY  04-23-2002   and Left Salpingoophorectomy/  Anterior Repair/  Tension Free Tape Sling placement   LASER ABLATION OF THE CERVIX  x2  1980's   TUBAL LIGATION  1980's   VENTRAL HERNIA REPAIR N/A 09/05/2016   Procedure: LAPAROSCOPIC VENTRAL HERNIA;  Surgeon: [name], MD;  Location: [place];  Service: General;  Laterality: N/A;    Family History  Problem Relation Age of Onset   Alcohol abuse Mother    Lung cancer Mother    Colon cancer Mother    Arthritis Father    Hyperlipidemia Father    Heart disease Father    Hypertension Father    COPD Father    Kidney disease Paternal Uncle    Cancer Maternal Grandmother        colon   Colon cancer Maternal Grandmother    Rectal cancer Maternal Grandmother    Cancer Paternal Grandmother        breast   Kidney cancer Sister        lung cancer   Cirrhosis Sister    Esophageal cancer Neg Hx    Stomach cancer Neg Hx     Social History   Socioeconomic History   Marital status: Legally Separated    Spouse name: Not on file   Number of children: Not on file   Years of education: Not on file   Highest education level: Not on file  Occupational  History   Not on file  Tobacco Use   Smoking status: Never   Smokeless tobacco: Never  Vaping Use   Vaping Use: Never used  Substance and Sexual Activity   Alcohol use: Never    Comment: 1 x year   Drug use: Never   Sexual activity: Yes  Other Topics Concern   Not on file  Social History Narrative   Not on file   Social Determinants of Health   Financial Resource Strain: Not on file  Food Insecurity: Not on file  Transportation Needs: Not on file  Physical Activity: Not on file  Stress: Not on file  Social Connections: Not on file  Intimate Partner Violence: Not on file    Outpatient  Medications Prior to Visit  Medication Sig Dispense Refill   Accu-Chek Softclix Lancets lancets Use as instructed 100 each 12   amphetamine-dextroamphetamine (ADDERALL) 30 MG tablet Take one tablet by mouth twice daily.  May refill in one month. 60 tablet 0   amphetamine-dextroamphetamine (ADDERALL) 30 MG tablet Take one tablet twice daily. 60 tablet 0   amphetamine-dextroamphetamine (ADDERALL) 30 MG tablet Take 1 tablet by mouth twice daily.  May refill in 2 months. 60 tablet 0   atorvastatin (LIPITOR) 10 MG tablet TAKE 1 TABLET BY MOUTH DAILY 90 tablet 2   Biotin 1000 MCG tablet Take 1,000 mcg by mouth 3 (three) times daily.     blood glucose meter kit and supplies Dispense based on patient and insurance preference. Use up to four times daily as directed. (FOR ICD-10 E10.9, E11.9). 1 each 0   citalopram (CELEXA) 20 MG tablet Take 1 tablet (20 mg total) by mouth daily. 90 tablet 3   cyclobenzaprine (FLEXERIL) 10 MG tablet Take 10 mg by mouth 2 (two) times daily.     furosemide (LASIX) 20 MG tablet TAKE 1 TABLET BY MOUTH ONCE A DAY AS NEEDED FOR SWELLING 30 tablet 1   glucose blood test strip Use as instructed 100 each 12   levothyroxine (SYNTHROID) 50 MCG tablet Take 1 tablet (50 mcg total) by mouth daily. 90 tablet 0   losartan-hydrochlorothiazide (HYZAAR) 100-12.5 MG tablet Take 1 tablet by  mouth daily. 30 tablet 5   nortriptyline (PAMELOR) 25 MG capsule Take by mouth.     ondansetron (ZOFRAN-ODT) 8 MG disintegrating tablet Take 1 tablet (8 mg total) by mouth every 8 (eight) hours as needed for nausea or vomiting. 60 tablet 3   oxyCODONE-acetaminophen (PERCOCET/ROXICET) 5-325 MG tablet Take 1-2 tablets by mouth every 4 (four) hours as needed for moderate pain or severe pain. 60 tablet 0   pantoprazole (PROTONIX) 40 MG tablet TAKE 1 TABLET(40 MG) BY MOUTH TWICE DAILY 60 tablet 3   pramipexole (MIRAPEX) 0.125 MG tablet TAKE 1-2 TABLET EVERY EVENING AS NEEDED FOR RESTLESS LEG SYNDROME 60 tablet 3   Probiotic Product (ALIGN PO) Take 1 capsule by mouth daily.     Semaglutide, 1 MG/DOSE, (OZEMPIC, 1 MG/DOSE,) 2 MG/1.5ML SOPN Inject 1 mg into the skin once a week. 3 mL 5   SUMAtriptan (IMITREX) 100 MG tablet May repeat in 2 hours if headache persists or recurs.  Do not take more than 2 in 24 hours (Patient taking differently: Take 100 mg by mouth every 2 (two) hours as needed for migraine. May repeat in 2 hours if headache persists or recurs.  Do not take more than 2 in 24 hours) 10 tablet 11   Turmeric-Ginger 135-6 MG CHEW Chew 2 each by mouth daily.     Vitamin D, Ergocalciferol, (DRISDOL) 1.25 MG (50000 UNIT) CAPS capsule TAKE 1 CAPSULE BY MOUTH 1 TIME A WEEK (Patient taking differently: Take 50,000 Units by mouth every Monday. TAKE 1 CAPSULE BY MOUTH 1 TIME A WEEK) 12 capsule 3   No facility-administered medications prior to visit.    Allergies  Allergen Reactions   Keflex [Cephalexin] Swelling    Tongue swelling    ROS Review of Systems  Constitutional:  Positive for fatigue and unexpected weight change.  Respiratory:  Negative for cough.   Cardiovascular:  Negative for chest pain.  Gastrointestinal:  Positive for abdominal pain.  Genitourinary:  Negative for dysuria.     Objective:    Physical Exam Constitutional:  Appearance: She is obese.  HENT:     Head:      Comments: She has significant facial edema around the eyes and cheeks with cushingoid type appearance Cardiovascular:     Rate and Rhythm: Regular rhythm.     Comments: Slightly tachycardic with rate 105-110 Pulmonary:     Effort: Pulmonary effort is normal.     Breath sounds: Normal breath sounds.  Abdominal:     Comments: Abdomen is somewhat distended.  Normal bowel sounds.  Soft with no localizing tenderness.  Neurological:     General: No focal deficit present.    BP (!) 144/82 (BP Location: Left Arm, Patient Position: Sitting, Cuff Size: Normal)   Pulse (!) 120   Temp 98 F (36.7 C) (Oral)   Wt 280 lb 4.8 oz (127.1 kg)   SpO2 96%   BMI 48.11 kg/m  Wt Readings from Last 3 Encounters:  12/04/20 280 lb 4.8 oz (127.1 kg)  11/04/20 272 lb 9.6 oz (123.7 kg)  10/05/20 273 lb (123.8 kg)     Health Maintenance Due  Topic Date Due   PNEUMOCOCCAL POLYSACCHARIDE VACCINE AGE 34-64 HIGH RISK  Never done   FOOT EXAM  Never done   OPHTHALMOLOGY EXAM  Never done   Zoster Vaccines- Shingrix (1 of 2) Never done   COVID-19 Vaccine (2 - Pfizer series) 07/22/2020    There are no preventive care reminders to display for this patient.  Lab Results  Component Value Date   TSH 2.37 03/23/2020   Lab Results  Component Value Date   WBC 13.1 (H) 10/02/2020   HGB 11.7 (L) 10/02/2020   HCT 36.5 10/02/2020   MCV 77.9 (L) 10/02/2020   PLT 460.0 (H) 10/02/2020   Lab Results  Component Value Date   NA 139 03/23/2020   K 3.4 (L) 03/23/2020   CO2 29 03/23/2020   GLUCOSE 131 (H) 03/23/2020   BUN 12 03/23/2020   CREATININE 0.99 03/23/2020   BILITOT 0.4 03/23/2020   ALKPHOS 138 (H) 03/22/2019   AST 17 03/23/2020   ALT 21 03/23/2020   PROT 6.8 03/23/2020   ALBUMIN 3.5 03/22/2019   CALCIUM 9.5 03/23/2020   ANIONGAP 7 02/04/2020   GFR 68.15 03/22/2019   Lab Results  Component Value Date   CHOL 156 03/23/2020   Lab Results  Component Value Date   HDL 45 (L) 03/23/2020   Lab  Results  Component Value Date   LDLCALC 86 03/23/2020   Lab Results  Component Value Date   TRIG 155 (H) 03/23/2020   Lab Results  Component Value Date   CHOLHDL 3.5 03/23/2020   Lab Results  Component Value Date   HGBA1C 6.9 (H) 10/02/2020      Assessment & Plan:   #1 patient presents with symptoms of weight gain, edema, increasing blood pressure, easy bruisability, fatigue, mild tachycardia. .  Rule out Cushing's disease  -We will check 24-hour urine free cortisol -dexamethasone suppression test.   1 mg dexamethasone Sunday night and plan to get early morning cortisol level Monday -Check basic metabolic panel -Consider endocrine consult but will wait for labs above first  #2 longstanding history of morbid obesity.  Patient was in work-up considering gastric bypass but needs clarification of endocrine issues first.  We recommend putting that on hold until we rule out endocrine disorder  #3 type 2 diabetes.  Recent A1c 6.9%.  Patient on Ozempic. -We will need follow-up A1c within couple months  #4 complaints of persistent  somewhat diffuse abdominal pain.  She states she feels very distended.  Not sure how much of this is related to #1 we will go ahead with CT abdomen pelvis to further evaluate   Meds ordered this encounter  Medications   dexamethasone (DECADRON) 1 MG tablet    Sig: Take 1 tablet (1 mg total) by mouth daily with breakfast.    Dispense:  1 tablet    Refill:  0    Follow-up: No follow-ups on file.    Carolann Littler, MD

## 2020-12-04 NOTE — Patient Instructions (Signed)
Take the Dexamethsone 1 mg tablet Sunday night and we will get labs early Monday morning.

## 2020-12-07 ENCOUNTER — Other Ambulatory Visit: Payer: Self-pay | Admitting: Family Medicine

## 2020-12-07 ENCOUNTER — Other Ambulatory Visit: Payer: Medicare Other

## 2020-12-07 DIAGNOSIS — R635 Abnormal weight gain: Secondary | ICD-10-CM | POA: Diagnosis not present

## 2020-12-07 LAB — BASIC METABOLIC PANEL
BUN: 14 mg/dL (ref 6–23)
CO2: 26 mEq/L (ref 19–32)
Calcium: 8.8 mg/dL (ref 8.4–10.5)
Chloride: 104 mEq/L (ref 96–112)
Creatinine, Ser: 0.86 mg/dL (ref 0.40–1.20)
GFR: 74.6 mL/min (ref >60.00)
Glucose, Bld: 144 mg/dL — ABNORMAL HIGH (ref 70–99)
Potassium: 3.9 mEq/L (ref 3.5–5.1)
Sodium: 140 mEq/L (ref 135–145)

## 2020-12-07 LAB — APTT: aPTT: 26.8 s (ref 23.4–32.7)

## 2020-12-07 LAB — PROTIME-INR
INR: 1 ratio (ref 0.8–1.0)
Prothrombin Time: 11 s (ref 9.6–13.1)

## 2020-12-07 LAB — TSH: TSH: 2.45 u[IU]/mL (ref 0.35–4.50)

## 2020-12-07 LAB — T4, FREE: Free T4: 0.94 ng/dL (ref 0.60–1.60)

## 2020-12-07 LAB — CORTISOL: Cortisol, Plasma: 0.3 ug/dL

## 2020-12-07 NOTE — Addendum Note (Signed)
Addended by: Elmer Picker on: 12/07/2020 07:48 AM   Modules accepted: Orders

## 2020-12-09 ENCOUNTER — Other Ambulatory Visit: Payer: Self-pay

## 2020-12-09 ENCOUNTER — Other Ambulatory Visit: Payer: Self-pay | Admitting: Family Medicine

## 2020-12-09 ENCOUNTER — Encounter: Payer: Medicare Other | Attending: Surgery | Admitting: Skilled Nursing Facility1

## 2020-12-09 DIAGNOSIS — E1165 Type 2 diabetes mellitus with hyperglycemia: Secondary | ICD-10-CM | POA: Diagnosis not present

## 2020-12-09 NOTE — Telephone Encounter (Signed)
Pharmacy/insurance is requesting an alternative for Atorvastatin 10mg .   Information is below with suggested alternatives.

## 2020-12-09 NOTE — Progress Notes (Deleted)
Below is information from [employer] is requesting an alternative for Atorvastatin 10mg .   Thanks

## 2020-12-09 NOTE — Progress Notes (Signed)
Supervised Weight Loss Visit Bariatric Nutrition Education  Planned Surgery: sleeve  2 out of 6 SWL Appointments   NUTRITION ASSESSMENT   Anthropometrics  Start weight at [facility]: 274.1 lbs (date: 10/05/2020)  Weight: 284.3 lbs Height: 64 in BMI: 48.80 kg/m2     Clinical  Medical hx: GERD, kidney stones, diabetes, restless legs, IBS Medications: vitamin D, ozempic, biotin, b12 gummies, collagen, tumeric, allign  Labs: HbA1c of 6.9 on 10/02/20, Cortisol .3, T4 .94, TSH 2.45 Notable signs/symptoms: nausea, migraines Any previous deficiencies? Vitamin D  Lifestyle & Dietary Hx   Pt states she is doing really good with increasing her water.  Pt states she may be diagnosed with cushing's. Pt states her IBS has been flaring up more lately.  Pt states she is going to start checking her blood sugars and started on ozempic.  Pt states she swill be working with a  physical therapist soon.  Pt states she is frustrated with all the weight she has gained knowing it is not from how she has been eating.   Estimated daily fluid intake: unknown oz Supplements:  Current average weekly physical activity: trying to move a little more   24-Hr Dietary Recall First Meal: granola bar + coffee or strawberry pancakes + eggs + cheese Snack:  Second Meal: cornbread + buttermilk or chicken salad chic or banana sandwich Snack: candy Third Meal: eaten out: salad  Snack:  Beverages: coffee + sugar free cream and (cut down) sugar  Estimated Energy Needs Calories: 1500   NUTRITION DIAGNOSIS  Overweight/obesity ([place]-3.3) related to past poor dietary habits and physical inactivity as evidenced by patient w/ planned sleeve gastrectomy surgery following dietary guidelines for continued weight loss.  NUTRITION INTERVENTION  Nutrition counseling (C-1) and education (E-2) to facilitate bariatric surgery goals. Educated pt on lifestyle changes correcting blood sugars.   Pre-Op Goals Progress & New  Goals Continue: Practice CHEWING your food (aim for applesauce consistency) Continue: Practice not drinking 15 minutes before, during, and 30 minutes after each meal and snack Continue: Aim for 64-100 ounces of FLUID daily (with at least half of fluid intake being plain water)  continue: avoid alcohol continue cut back on sugar in coffee; choose sugar free creamer instead  continue: drink one 32 ounces bottle by 12 and then the next 32 ounce by 4pm NEW: try smoothies or greek yogurt (add metamucil to your smoothie) when no appetite NEW: limit fruit to 3 servings per day NEW: work on consistently eating throughout the day   Handouts Provided Include    Learning Style & Readiness for Change Teaching method utilized: Environmental health practitioner & Auditory  Demonstrated degree of understanding via: Teach Back  Readiness Level: contemplative  Barriers to learning/adherence to lifestyle change: unknown at this time  RD's Notes for next Visit  Assess pts adherence to chosen goals   MONITORING & EVALUATION Dietary intake, weekly physical activity, body weight, and pre-op goals in 1 month.   Next Steps  Patient is to return to [facility] fro next [facility]

## 2020-12-10 ENCOUNTER — Other Ambulatory Visit: Payer: Self-pay

## 2020-12-10 ENCOUNTER — Other Ambulatory Visit: Payer: Self-pay | Admitting: Family Medicine

## 2020-12-10 ENCOUNTER — Telehealth: Payer: Self-pay | Admitting: Family Medicine

## 2020-12-10 ENCOUNTER — Ambulatory Visit (INDEPENDENT_AMBULATORY_CARE_PROVIDER_SITE_OTHER)
Admission: RE | Admit: 2020-12-10 | Discharge: 2020-12-10 | Disposition: A | Discharge status: Home / Self Care | Payer: Medicare Other | Source: Ambulatory Visit | Attending: Family Medicine | Admitting: Family Medicine

## 2020-12-10 DIAGNOSIS — K760 Fatty (change of) liver, not elsewhere classified: Secondary | ICD-10-CM | POA: Diagnosis not present

## 2020-12-10 DIAGNOSIS — R140 Abdominal distension (gaseous): Secondary | ICD-10-CM

## 2020-12-10 DIAGNOSIS — I700 Atherosclerosis of aorta: Secondary | ICD-10-CM | POA: Diagnosis not present

## 2020-12-10 DIAGNOSIS — E785 Hyperlipidemia, unspecified: Secondary | ICD-10-CM

## 2020-12-10 DIAGNOSIS — Z981 Arthrodesis status: Secondary | ICD-10-CM | POA: Diagnosis not present

## 2020-12-10 MED ORDER — PANTOPRAZOLE SODIUM 40 MG PO TBEC: DELAYED_RELEASE_TABLET | ORAL | 1 refills | Status: DC

## 2020-12-10 MED ORDER — ROSUVASTATIN CALCIUM 10 MG PO TABS: 10.0000 mg | ORAL_TABLET | Freq: Every day | ORAL | 1 refills | Status: DC

## 2020-12-10 NOTE — Telephone Encounter (Signed)
Rx has been sent in. Nothing further needed. 

## 2020-12-10 NOTE — Telephone Encounter (Signed)
Suggested alternatives for Atorvastatin 10 mg.     Simvastain-5mg , 10mg , 20mg , 40mg , 80mg   Rosvastatin 5mg , 10mg , 20mg , 40mg   Lovastatin 20mg , 40mg   Pravastatin 10mg , 20mg , 40mg , 80mg 

## 2020-12-10 NOTE — Telephone Encounter (Signed)
Pt call and stated he need a new RX for Rosvastatin sent to  [id number] - [place], [place]. Phone:  [phone]  Fax:  [phone]

## 2020-12-11 LAB — CORTISOL, FREE AND CORTISONE, 24 HOUR URINE W/CREATININE
24 Hour urine volume (VMAHVA): 700 mL
CREATININE, URINE: 1.1 g/(24.h) (ref 0.50–2.15)
Cortisol (Ur), Free: 1.2 mcg/24 h — ABNORMAL LOW (ref 4.0–50.0)
Cortisone, 24H Ur: 1.6 mcg/24 h — ABNORMAL LOW (ref 23–195)

## 2020-12-15 ENCOUNTER — Other Ambulatory Visit: Payer: Self-pay

## 2020-12-15 ENCOUNTER — Ambulatory Visit (INDEPENDENT_AMBULATORY_CARE_PROVIDER_SITE_OTHER): Payer: Medicare Other | Admitting: Family Medicine

## 2020-12-15 ENCOUNTER — Encounter: Payer: Self-pay | Admitting: Family Medicine

## 2020-12-15 VITALS — BP 150/80 | HR 115 | Temp 98.1°F | Wt 281.9 lb

## 2020-12-15 DIAGNOSIS — R635 Abnormal weight gain: Secondary | ICD-10-CM

## 2020-12-15 DIAGNOSIS — K760 Fatty (change of) liver, not elsewhere classified: Secondary | ICD-10-CM

## 2020-12-15 DIAGNOSIS — R238 Other skin changes: Secondary | ICD-10-CM

## 2020-12-15 DIAGNOSIS — R233 Spontaneous ecchymoses: Secondary | ICD-10-CM

## 2020-12-15 NOTE — Progress Notes (Signed)
Established Patient Office Visit  Subjective:  Patient ID: [name] Sherman, female    DOB: 1960-09-07  Age: 60 y.o. MRN: [id number]  CC:  Chief Complaint  Patient presents with   Follow-up    Pt also needs refills on adderall    HPI [name] Sherman presents for medical follow-up.  Recent visit with concerns for weight gain, increased fatigue, easy bruising.  She has hyperglycemia and recently went on Ozempic but even with Ozempic was having some weight gain.  She feels like she was retaining a lot of fluid.  She had normal INR and PTT along with normal platelet count.  Her specific concern last visit was hypercortisolism.  We obtained 24-hour urine free cortisol and also she had dexamethasone suppression test with 1 mg dexamethasone with follow-up early morning cortisol level.  These actually came back slightly low.  She has history of recent normal potassium and sodium.  Recent thyroid function was normal.  She has had multiple steroid injections over the past year per orthopedics.  She states at one point she was getting a steroid injection in her hip about every month.  Her most recent steroid injection at this point though has been about 12 weeks ago.  She is actually scheduled for steroid injection tomorrow.  Patient had complained of some diffuse midepigastric abdominal pain.  We obtained CT abdomen pelvis.  This showed no concerning masses.  She did have some diffuse hepatic steatosis but no acute abnormalities.  Groundglass opacities lung bases which were felt to be possibly postinflammatory.  She did have severe COVID infection several months ago.  Denies any cough or active respiratory symptoms at this time.  No adrenal masses or other worrisome masses on CT scan.  Past Medical History:  Diagnosis Date   ADD (attention deficit disorder)    Anemia    Anxiety    Arthritis    shoulder, right (arthritis, tendonitis, bursitis)   Cancer (HCC)    cervical cancer (conization)     Chronic kidney disease    Complication of anesthesia    COVID-19 07/2019   Depression    Dyspnea 2020   GERD (gastroesophageal reflux disease)    History of cervical cancer    s/p  laser ablation of cervix 1980's   History of kidney stones    Hyperlipidemia    Hypertension    Hypothyroidism    IBS (irritable bowel syndrome)    Irregular heart rate    as a child, has never had any problems    Kidney stones    Left ureteral stone    Migraine    OSA (obstructive sleep apnea)    pt used cpap up until 2013 states lost wt and did not need anymore   PONV (postoperative nausea and vomiting)    Pre-diabetes    Restless legs    Sleep apnea    wears CPAP   Umbilical hernia without obstruction or gangrene    Urgency of urination    Vertigo    Vitamin D deficiency     Past Surgical History:  Procedure Laterality Date   ABDOMINAL HYSTERECTOMY     BACK SURGERY  04/27/2018   and in 2021 (Fusion)   BUNIONECTOMY Right 2004   COLONOSCOPY     CYSTOSCOPY W/ RETROGRADES Bilateral 05/26/2015   Procedure: CYSTOSCOPY WITH RETROGRADE PYELOGRAM;  Surgeon: Jerilee Field, MD;  Location: [facility];  Service: Urology;  Laterality: Bilateral;   CYSTOSCOPY W/ URETERAL STENT REMOVAL Left  05/26/2015   Procedure: CYSTOSCOPY WITH STENT REMOVAL;  Surgeon: Festus Aloe, MD;  Location[facility];  Service: Urology;  Laterality: Left;   CYSTOSCOPY WITH RETROGRADE PYELOGRAM, URETEROSCOPY AND STENT PLACEMENT Left 05/01/2015   Procedure: CYSTOSCOPY WITH RETROGRADE PYELOGRAM AND STENT PLACEMENT;  Surgeon: Festus Aloe, MD;  Location: [facility];  Service: Urology;  Laterality: Left;   CYSTOSCOPY WITH STENT PLACEMENT Bilateral 05/26/2015   Procedure: CYSTOSCOPY WITH STENT PLACEMENT;  Surgeon: Festus Aloe, MD;  Location: [facility];  Service: Urology;  Laterality: Bilateral;   CYSTOSCOPY WITH URETEROSCOPY Bilateral 05/26/2015   Procedure: CYSTOSCOPY WITH  URETEROSCOPY;  Surgeon: Festus Aloe, MD;  Location: [facility];  Service: Urology;  Laterality: Bilateral;   CYSTOSCOPY/URETEROSCOPY/HOLMIUM LASER/STENT PLACEMENT Right 06/09/2015   Procedure: CYSTOSCOPY/URETEROSCOPY/STENT PLACEMENT REMOVAL LEFT URETERAL STENT;  Surgeon: Festus Aloe, MD;  Location: [facility];  Service: Urology;  Laterality: Right;   DIAGNOSTIC LAPAROSCOPY     HERNIA REPAIR     HOLMIUM LASER APPLICATION Left 16/03/9603   Procedure: HOLMIUM LASER APPLICATION;  Surgeon: Festus Aloe, MD;  Location: [facility];  Service: Urology;  Laterality: Left;   HOLMIUM LASER APPLICATION Right 54/02/8118   Procedure: HOLMIUM LASER APPLICATION;  Surgeon: Festus Aloe, MD;  Location: [facility];  Service: Urology;  Laterality: Right;   INSERTION OF MESH N/A 09/05/2016   Procedure: INSERTION OF MESH;  Surgeon: [name], MD;  Location: [place];  Service: General;  Laterality: N/A;   LAPAROSCOPIC ASSISTED VAGINAL HYSTERECTOMY  04-23-2002   and Left Salpingoophorectomy/  Anterior Repair/  Tension Free Tape Sling placement   LASER ABLATION OF THE CERVIX  x2  1980's   TUBAL LIGATION  1980's   VENTRAL HERNIA REPAIR N/A 09/05/2016   Procedure: LAPAROSCOPIC VENTRAL HERNIA;  Surgeon: [name], MD;  Location: [place];  Service: General;  Laterality: N/A;    Family History  Problem Relation Age of Onset   Alcohol abuse Mother    Lung cancer Mother    Colon cancer Mother    Arthritis Father    Hyperlipidemia Father    Heart disease Father    Hypertension Father    COPD Father    Kidney disease Paternal Uncle    Cancer Maternal Grandmother        colon   Colon cancer Maternal Grandmother    Rectal cancer Maternal Grandmother    Cancer Paternal Grandmother        breast   Kidney cancer Sister        lung cancer   Cirrhosis Sister    Esophageal cancer Neg Hx    Stomach cancer Neg Hx     Social History   Socioeconomic History   Marital  status: Legally Separated    Spouse name: Not on file   Number of children: Not on file   Years of education: Not on file   Highest education level: Not on file  Occupational History   Not on file  Tobacco Use   Smoking status: Never   Smokeless tobacco: Never  Vaping Use   Vaping Use: Never used  Substance and Sexual Activity   Alcohol use: Never    Comment: 1 x year   Drug use: Never   Sexual activity: Yes  Other Topics Concern   Not on file  Social History Narrative   Not on file   Social Determinants of Health   Financial Resource Strain: Not on file  Food Insecurity: Not on  file  Transportation Needs: Not on file  Physical Activity: Not on file  Stress: Not on file  Social Connections: Not on file  Intimate Partner Violence: Not on file    Outpatient Medications Prior to Visit  Medication Sig Dispense Refill   Accu-Chek Softclix Lancets lancets Use as instructed 100 each 12   amphetamine-dextroamphetamine (ADDERALL) 30 MG tablet Take one tablet by mouth twice daily.  May refill in one month. 60 tablet 0   amphetamine-dextroamphetamine (ADDERALL) 30 MG tablet Take one tablet twice daily. 60 tablet 0   amphetamine-dextroamphetamine (ADDERALL) 30 MG tablet Take 1 tablet by mouth twice daily.  May refill in 2 months. 60 tablet 0   atorvastatin (LIPITOR) 10 MG tablet TAKE 1 TABLET BY MOUTH DAILY 90 tablet 2   Biotin 1000 MCG tablet Take 1,000 mcg by mouth 3 (three) times daily.     blood glucose meter kit and supplies Dispense based on patient and insurance preference. Use up to four times daily as directed. (FOR ICD-10 E10.9, E11.9). 1 each 0   citalopram (CELEXA) 20 MG tablet Take 1 tablet (20 mg total) by mouth daily. 90 tablet 3   cyclobenzaprine (FLEXERIL) 10 MG tablet Take 10 mg by mouth 2 (two) times daily.     furosemide (LASIX) 20 MG tablet TAKE 1 TABLET BY MOUTH ONCE A DAY AS NEEDED FOR SWELLING 30 tablet 1   glucose blood test strip Use as instructed 100 each  12   levothyroxine (SYNTHROID) 50 MCG tablet Take 1 tablet (50 mcg total) by mouth daily. 90 tablet 0   losartan-hydrochlorothiazide (HYZAAR) 100-12.5 MG tablet Take 1 tablet by mouth daily. 30 tablet 5   nortriptyline (PAMELOR) 25 MG capsule Take by mouth.     ondansetron (ZOFRAN-ODT) 8 MG disintegrating tablet Take 1 tablet (8 mg total) by mouth every 8 (eight) hours as needed for nausea or vomiting. 60 tablet 3   oxyCODONE-acetaminophen (PERCOCET/ROXICET) 5-325 MG tablet Take 1-2 tablets by mouth every 4 (four) hours as needed for moderate pain or severe pain. 60 tablet 0   pantoprazole (PROTONIX) 40 MG tablet TAKE 1 TABLET(40 MG) BY MOUTH TWICE DAILY 60 tablet 1   pramipexole (MIRAPEX) 0.125 MG tablet TAKE 1-2 TABLET EVERY EVENING AS NEEDED FOR RESTLESS LEG SYNDROME 60 tablet 3   Probiotic Product (ALIGN PO) Take 1 capsule by mouth daily.     rosuvastatin (CRESTOR) 10 MG tablet Take 1 tablet (10 mg total) by mouth daily. 90 tablet 1   Semaglutide, 1 MG/DOSE, (OZEMPIC, 1 MG/DOSE,) 2 MG/1.5ML SOPN Inject 1 mg into the skin once a week. 3 mL 5   SUMAtriptan (IMITREX) 100 MG tablet May repeat in 2 hours if headache persists or recurs.  Do not take more than 2 in 24 hours (Patient taking differently: Take 100 mg by mouth every 2 (two) hours as needed for migraine. May repeat in 2 hours if headache persists or recurs.  Do not take more than 2 in 24 hours) 10 tablet 11   Turmeric-Ginger 135-6 MG CHEW Chew 2 each by mouth daily.     Vitamin D, Ergocalciferol, (DRISDOL) 1.25 MG (50000 UNIT) CAPS capsule TAKE 1 CAPSULE BY MOUTH 1 TIME A WEEK (Patient taking differently: Take 50,000 Units by mouth every Monday. TAKE 1 CAPSULE BY MOUTH 1 TIME A WEEK) 12 capsule 3   dexamethasone (DECADRON) 1 MG tablet Take 1 tablet (1 mg total) by mouth daily with breakfast. 1 tablet 0   No facility-administered medications prior  to visit.    Allergies  Allergen Reactions   Keflex [Cephalexin] Swelling    Tongue  swelling    ROS Review of Systems  Constitutional:  Positive for fatigue. Negative for chills and fever.  Respiratory:  Negative for cough and shortness of breath.   Cardiovascular:  Negative for chest pain.  Gastrointestinal:  Negative for nausea and vomiting.  Neurological:  Negative for headaches.     Objective:    Physical Exam Vitals reviewed.  Constitutional:      Appearance: She is obese.  Cardiovascular:     Rate and Rhythm: Normal rate and regular rhythm.  Pulmonary:     Effort: Pulmonary effort is normal.     Breath sounds: Normal breath sounds.  Musculoskeletal:     Cervical back: Neck supple.  Skin:    Comments: Multiple bruises noted on her forearms and legs.    BP (!) 150/80 (BP Location: Left Wrist, Patient Position: Sitting, Cuff Size: Normal)   Pulse (!) 115   Temp 98.1 F (36.7 C) (Oral)   Wt 281 lb 14.4 oz (127.9 kg)   SpO2 96%   BMI 48.39 kg/m  Wt Readings from Last 3 Encounters:  12/15/20 281 lb 14.4 oz (127.9 kg)  12/09/20 284 lb 4.8 oz (129 kg)  12/04/20 280 lb 4.8 oz (127.1 kg)     Health Maintenance Due  Topic Date Due   PNEUMOCOCCAL POLYSACCHARIDE VACCINE AGE 70-64 HIGH RISK  Never done   FOOT EXAM  Never done   OPHTHALMOLOGY EXAM  Never done   Zoster Vaccines- Shingrix (1 of 2) Never done   COVID-19 Vaccine (2 - Pfizer series) 07/22/2020    There are no preventive care reminders to display for this patient.  Lab Results  Component Value Date   TSH 2.45 12/07/2020   Lab Results  Component Value Date   WBC 13.1 (H) 10/02/2020   HGB 11.7 (L) 10/02/2020   HCT 36.5 10/02/2020   MCV 77.9 (L) 10/02/2020   PLT 460.0 (H) 10/02/2020   Lab Results  Component Value Date   NA 140 12/07/2020   K 3.9 12/07/2020   CO2 26 12/07/2020   GLUCOSE 144 (H) 12/07/2020   BUN 14 12/07/2020   CREATININE 0.86 12/07/2020   BILITOT 0.4 03/23/2020   ALKPHOS 138 (H) 03/22/2019   AST 17 03/23/2020   ALT 21 03/23/2020   PROT 6.8 03/23/2020    ALBUMIN 3.5 03/22/2019   CALCIUM 8.8 12/07/2020   ANIONGAP 7 02/04/2020   GFR 74.60 12/07/2020   Lab Results  Component Value Date   CHOL 156 03/23/2020   Lab Results  Component Value Date   HDL 45 (L) 03/23/2020   Lab Results  Component Value Date   LDLCALC 86 03/23/2020   Lab Results  Component Value Date   TRIG 155 (H) 03/23/2020   Lab Results  Component Value Date   CHOLHDL 3.5 03/23/2020   Lab Results  Component Value Date   HGBA1C 6.9 (H) 10/02/2020      Assessment & Plan:   #1 weight gain.  Patient specifically had concerns regarding hypercortisolism.  Recent screening test as above were normal.  We did not obtain salivary cortisol levels but did 2 screening tests above which were unremarkable in terms of any hypercortisolism.  Recent thyroid function was normal. -Patient requesting endocrine consult and we will set up  #2 type 2 diabetes.  Recent A1c in April 6.9%.  Patient now on Ozempic.  Hopefully will be  improved at follow-up and recheck A1c within 1 to 2 months  #3 easy bruising.  Recent INR and PTT normal.  No history of thrombocytopenia.  Patient has had multiple steroids per orthopedics and not sure if this is contributing.  Does not take any aspirin currently.  #3 fatty liver changes on recent CT.  We discussed significance and importance of weight loss and managing.  We discussed potential complications of long-term fatty liver including nonalcoholic cirrhosis   No orders of the defined types were placed in this encounter.   Follow-up: No follow-ups on file.    Carolann Littler, MD

## 2020-12-15 NOTE — Patient Instructions (Signed)
Try to hold off on further steroid injection if possible at this time.   I am setting up endocrinologist referral.

## 2020-12-18 ENCOUNTER — Other Ambulatory Visit: Payer: Self-pay | Admitting: Family Medicine

## 2020-12-21 ENCOUNTER — Ambulatory Visit (INDEPENDENT_AMBULATORY_CARE_PROVIDER_SITE_OTHER): Payer: Medicare Other | Admitting: Family Medicine

## 2020-12-21 ENCOUNTER — Encounter: Payer: Self-pay | Admitting: Family Medicine

## 2020-12-21 VITALS — BP 140/76 | HR 133 | Temp 98.7°F | Wt 276.9 lb

## 2020-12-21 DIAGNOSIS — S80812A Abrasion, left lower leg, initial encounter: Secondary | ICD-10-CM | POA: Diagnosis not present

## 2020-12-21 DIAGNOSIS — R238 Other skin changes: Secondary | ICD-10-CM | POA: Diagnosis not present

## 2020-12-21 DIAGNOSIS — S50812A Abrasion of left forearm, initial encounter: Secondary | ICD-10-CM | POA: Diagnosis not present

## 2020-12-21 DIAGNOSIS — R233 Spontaneous ecchymoses: Secondary | ICD-10-CM

## 2020-12-21 NOTE — Patient Instructions (Addendum)
Clean abrasions daily with soap and water  Consider applying some topical vaseline.    Leave off the peroxide and neosporin.

## 2020-12-21 NOTE — Progress Notes (Signed)
Established Patient Office Visit  Subjective:  Patient ID: [name] Sherman, female    DOB: Aug 14, 1960  Age: 60 y.o. MRN: [id number]  CC:  Chief Complaint  Patient presents with   Fall    HPI [name] Sherman presents for left forearm and left leg injuries sustained after fall last Friday.  She was getting her car wash down a slip of paper fell out of her hand and she went to try to grab this and lost her balance and fell landing against a bench.  No loss of consciousness.  No head injury.  She had fairly large abrasion of her left anterior leg and left forearm.  Wounds were cleaned promptly with soap and water as well as peroxide and husband applied some Neosporin.  There was concern to make sure these were not getting infected.  No significant surrounding erythema.  No fevers or chills.  Ambulating without difficulty.  No bony pain.  Past Medical History:  Diagnosis Date   ADD (attention deficit disorder)    Anemia    Anxiety    Arthritis    shoulder, right (arthritis, tendonitis, bursitis)   Cancer (HCC)    cervical cancer (conization)    Chronic kidney disease    Complication of anesthesia    COVID-19 07/2019   Depression    Dyspnea 2020   GERD (gastroesophageal reflux disease)    History of cervical cancer    s/p  laser ablation of cervix 1980's   History of kidney stones    Hyperlipidemia    Hypertension    Hypothyroidism    IBS (irritable bowel syndrome)    Irregular heart rate    as a child, has never had any problems    Kidney stones    Left ureteral stone    Migraine    OSA (obstructive sleep apnea)    pt used cpap up until 2013 states lost wt and did not need anymore   PONV (postoperative nausea and vomiting)    Pre-diabetes    Restless legs    Sleep apnea    wears CPAP   Umbilical hernia without obstruction or gangrene    Urgency of urination    Vertigo    Vitamin D deficiency     Past Surgical History:  Procedure Laterality Date   ABDOMINAL  HYSTERECTOMY     BACK SURGERY  04/27/2018   and in 2021 (Fusion)   BUNIONECTOMY Right 2004   COLONOSCOPY     CYSTOSCOPY W/ RETROGRADES Bilateral 05/26/2015   Procedure: CYSTOSCOPY WITH RETROGRADE PYELOGRAM;  Surgeon: Festus Aloe, MD;  Location: [facility];  Service: Urology;  Laterality: Bilateral;   CYSTOSCOPY W/ URETERAL STENT REMOVAL Left 05/26/2015   Procedure: CYSTOSCOPY WITH STENT REMOVAL;  Surgeon: Festus Aloe, MD;  Location: [facility];  Service: Urology;  Laterality: Left;   CYSTOSCOPY WITH RETROGRADE PYELOGRAM, URETEROSCOPY AND STENT PLACEMENT Left 05/01/2015   Procedure: CYSTOSCOPY WITH RETROGRADE PYELOGRAM AND STENT PLACEMENT;  Surgeon: Festus Aloe, MD;  Location: [facility];  Service: Urology;  Laterality: Left;   CYSTOSCOPY WITH STENT PLACEMENT Bilateral 05/26/2015   Procedure: CYSTOSCOPY WITH STENT PLACEMENT;  Surgeon: Festus Aloe, MD;  Location: [facility];  Service: Urology;  Laterality: Bilateral;   CYSTOSCOPY WITH URETEROSCOPY Bilateral 05/26/2015   Procedure: CYSTOSCOPY WITH URETEROSCOPY;  Surgeon: Festus Aloe, MD;  Location: [facility];  Service: Urology;  Laterality: Bilateral;   CYSTOSCOPY/URETEROSCOPY/HOLMIUM LASER/STENT PLACEMENT Right 06/09/2015   Procedure: CYSTOSCOPY/URETEROSCOPY/STENT PLACEMENT  REMOVAL LEFT URETERAL STENT;  Surgeon: Festus Aloe, MD;  Location: [facility];  Service: Urology;  Laterality: Right;   DIAGNOSTIC LAPAROSCOPY     HERNIA REPAIR     HOLMIUM LASER APPLICATION Left 98/92/1194   Procedure: HOLMIUM LASER APPLICATION;  Surgeon: Festus Aloe, MD;  Location: [facility];  Service: Urology;  Laterality: Left;   HOLMIUM LASER APPLICATION Right 17/40/8144   Procedure: HOLMIUM LASER APPLICATION;  Surgeon: Festus Aloe, MD;  Location: [facility];  Service: Urology;  Laterality: Right;   INSERTION OF MESH N/A 09/05/2016   Procedure: INSERTION OF MESH;   Surgeon: [name], MD;  Location: [place];  Service: General;  Laterality: N/A;   LAPAROSCOPIC ASSISTED VAGINAL HYSTERECTOMY  04-23-2002   and Left Salpingoophorectomy/  Anterior Repair/  Tension Free Tape Sling placement   LASER ABLATION OF THE CERVIX  x2  1980's   TUBAL LIGATION  1980's   VENTRAL HERNIA REPAIR N/A 09/05/2016   Procedure: LAPAROSCOPIC VENTRAL HERNIA;  Surgeon: [name], MD;  Location: [place];  Service: General;  Laterality: N/A;    Family History  Problem Relation Age of Onset   Alcohol abuse Mother    Lung cancer Mother    Colon cancer Mother    Arthritis Father    Hyperlipidemia Father    Heart disease Father    Hypertension Father    COPD Father    Kidney disease Paternal Uncle    Cancer Maternal Grandmother        colon   Colon cancer Maternal Grandmother    Rectal cancer Maternal Grandmother    Cancer Paternal Grandmother        breast   Kidney cancer Sister        lung cancer   Cirrhosis Sister    Esophageal cancer Neg Hx    Stomach cancer Neg Hx     Social History   Socioeconomic History   Marital status: Legally Separated    Spouse name: Not on file   Number of children: Not on file   Years of education: Not on file   Highest education level: Not on file  Occupational History   Not on file  Tobacco Use   Smoking status: Never   Smokeless tobacco: Never  Vaping Use   Vaping Use: Never used  Substance and Sexual Activity   Alcohol use: Never    Comment: 1 x year   Drug use: Never   Sexual activity: Yes  Other Topics Concern   Not on file  Social History Narrative   Not on file   Social Determinants of Health   Financial Resource Strain: Not on file  Food Insecurity: Not on file  Transportation Needs: Not on file  Physical Activity: Not on file  Stress: Not on file  Social Connections: Not on file  Intimate Partner Violence: Not on file    Outpatient Medications Prior to Visit  Medication Sig Dispense Refill    Accu-Chek Softclix Lancets lancets Use as instructed 100 each 12   amphetamine-dextroamphetamine (ADDERALL) 30 MG tablet Take one tablet by mouth twice daily.  May refill in one month. 60 tablet 0   amphetamine-dextroamphetamine (ADDERALL) 30 MG tablet Take one tablet twice daily. 60 tablet 0   amphetamine-dextroamphetamine (ADDERALL) 30 MG tablet Take 1 tablet by mouth twice daily.  May refill in 2 months. 60 tablet 0   atorvastatin (LIPITOR) 10 MG tablet TAKE 1 TABLET BY MOUTH DAILY 90 tablet 2   Biotin  1000 MCG tablet Take 1,000 mcg by mouth 3 (three) times daily.     blood glucose meter kit and supplies Dispense based on patient and insurance preference. Use up to four times daily as directed. (FOR ICD-10 E10.9, E11.9). 1 each 0   citalopram (CELEXA) 20 MG tablet Take 1 tablet (20 mg total) by mouth daily. 90 tablet 3   cyclobenzaprine (FLEXERIL) 10 MG tablet Take 10 mg by mouth 2 (two) times daily.     furosemide (LASIX) 20 MG tablet TAKE 1 TABLET BY MOUTH ONCE A DAY AS NEEDED FOR SWELLING 30 tablet 1   glucose blood test strip Use as instructed 100 each 12   losartan-hydrochlorothiazide (HYZAAR) 100-12.5 MG tablet Take 1 tablet by mouth daily. 30 tablet 5   nortriptyline (PAMELOR) 25 MG capsule Take by mouth.     ondansetron (ZOFRAN-ODT) 8 MG disintegrating tablet Take 1 tablet (8 mg total) by mouth every 8 (eight) hours as needed for nausea or vomiting. 60 tablet 3   oxyCODONE-acetaminophen (PERCOCET/ROXICET) 5-325 MG tablet Take 1-2 tablets by mouth every 4 (four) hours as needed for moderate pain or severe pain. 60 tablet 0   pantoprazole (PROTONIX) 40 MG tablet TAKE 1 TABLET(40 MG) BY MOUTH TWICE DAILY 60 tablet 1   pramipexole (MIRAPEX) 0.125 MG tablet TAKE 1-2 TABLET EVERY EVENING AS NEEDED FOR RESTLESS LEG SYNDROME 60 tablet 3   Probiotic Product (ALIGN PO) Take 1 capsule by mouth daily.     rosuvastatin (CRESTOR) 10 MG tablet Take 1 tablet (10 mg total) by mouth daily. 90 tablet 1    Semaglutide, 1 MG/DOSE, (OZEMPIC, 1 MG/DOSE,) 2 MG/1.5ML SOPN Inject 1 mg into the skin once a week. 3 mL 5   SUMAtriptan (IMITREX) 100 MG tablet May repeat in 2 hours if headache persists or recurs.  Do not take more than 2 in 24 hours (Patient taking differently: Take 100 mg by mouth every 2 (two) hours as needed for migraine. May repeat in 2 hours if headache persists or recurs.  Do not take more than 2 in 24 hours) 10 tablet 11   SYNTHROID 50 MCG tablet TAKE 1 TABLET BY MOUTH EVERY DAY 30 tablet 2   Turmeric-Ginger 135-6 MG CHEW Chew 2 each by mouth daily.     Vitamin D, Ergocalciferol, (DRISDOL) 1.25 MG (50000 UNIT) CAPS capsule TAKE 1 CAPSULE BY MOUTH 1 TIME A WEEK (Patient taking differently: Take 50,000 Units by mouth every Monday. TAKE 1 CAPSULE BY MOUTH 1 TIME A WEEK) 12 capsule 3   No facility-administered medications prior to visit.    Allergies  Allergen Reactions   Keflex [Cephalexin] Swelling    Tongue swelling    ROS Review of Systems  Constitutional:  Negative for chills and fever.  Neurological:  Negative for headaches.     Objective:    Physical Exam Vitals reviewed.  Cardiovascular:     Rate and Rhythm: Regular rhythm.  Pulmonary:     Effort: Pulmonary effort is normal.     Breath sounds: Normal breath sounds.  Musculoskeletal:     Cervical back: Neck supple.  Skin:    Comments: Large abrasion left anterior leg.  No signs of secondary infection.  No purulent drainage.  No surrounding cellulitis changes.  She has abrasion left forearm which likewise shows no signs of cellulitis.  Mild surrounding bruising.  No hematoma.  Neurological:     Mental Status: She is alert.    BP 140/76 (BP Location: Left Arm, Patient  Position: Sitting, Cuff Size: Normal)   Pulse (!) 133   Temp 98.7 F (37.1 C) (Oral)   Wt 276 lb 14.4 oz (125.6 kg)   SpO2 93%   BMI 47.53 kg/m  Wt Readings from Last 3 Encounters:  12/21/20 276 lb 14.4 oz (125.6 kg)  12/15/20 281 lb 14.4 oz  (127.9 kg)  12/09/20 284 lb 4.8 oz (129 kg)     Health Maintenance Due  Topic Date Due   PNEUMOCOCCAL POLYSACCHARIDE VACCINE AGE 39-64 HIGH RISK  Never done   FOOT EXAM  Never done   OPHTHALMOLOGY EXAM  Never done   Zoster Vaccines- Shingrix (1 of 2) Never done   COVID-19 Vaccine (2 - Pfizer series) 07/22/2020    There are no preventive care reminders to display for this patient.  Lab Results  Component Value Date   TSH 2.45 12/07/2020   Lab Results  Component Value Date   WBC 13.1 (H) 10/02/2020   HGB 11.7 (L) 10/02/2020   HCT 36.5 10/02/2020   MCV 77.9 (L) 10/02/2020   PLT 460.0 (H) 10/02/2020   Lab Results  Component Value Date   NA 140 12/07/2020   K 3.9 12/07/2020   CO2 26 12/07/2020   GLUCOSE 144 (H) 12/07/2020   BUN 14 12/07/2020   CREATININE 0.86 12/07/2020   BILITOT 0.4 03/23/2020   ALKPHOS 138 (H) 03/22/2019   AST 17 03/23/2020   ALT 21 03/23/2020   PROT 6.8 03/23/2020   ALBUMIN 3.5 03/22/2019   CALCIUM 8.8 12/07/2020   ANIONGAP 7 02/04/2020   GFR 74.60 12/07/2020   Lab Results  Component Value Date   CHOL 156 03/23/2020   Lab Results  Component Value Date   HDL 45 (L) 03/23/2020   Lab Results  Component Value Date   LDLCALC 86 03/23/2020   Lab Results  Component Value Date   TRIG 155 (H) 03/23/2020   Lab Results  Component Value Date   CHOLHDL 3.5 03/23/2020   Lab Results  Component Value Date   HGBA1C 6.9 (H) 10/02/2020      Assessment & Plan:   #1 abrasions left leg and left forearm following fall.  No signs of secondary infection.  -Tetanus up-to-date -Discussed good wound care.  Keep clean with soap and water.  Avoid regular use of peroxide or Neosporin.  May use little bit of topical Vaseline -Follow-up promptly for any cellulitis changes or other concerns  #2 patient remains concerned about possible hypercortisolism based on symptoms of weight gain, fluid retention, easy bruising, etc.  Recent dexamethasone suppression  test and 24-hour urine free cortisol were normal.  We have placed referral to endocrinology per her request and are waiting to hear back regarding that    Follow-up: No follow-ups on file.    Carolann Littler, MD

## 2020-12-24 ENCOUNTER — Other Ambulatory Visit: Payer: Self-pay | Admitting: Family Medicine

## 2020-12-30 ENCOUNTER — Other Ambulatory Visit: Payer: Self-pay | Admitting: Family Medicine

## 2020-12-31 DIAGNOSIS — G4733 Obstructive sleep apnea (adult) (pediatric): Secondary | ICD-10-CM | POA: Diagnosis not present

## 2021-01-05 DIAGNOSIS — M48062 Spinal stenosis, lumbar region with neurogenic claudication: Secondary | ICD-10-CM | POA: Diagnosis not present

## 2021-01-11 ENCOUNTER — Other Ambulatory Visit: Payer: Self-pay | Admitting: Family Medicine

## 2021-01-12 ENCOUNTER — Other Ambulatory Visit: Payer: Self-pay

## 2021-01-12 ENCOUNTER — Encounter: Payer: Medicare Other | Attending: Surgery | Admitting: Skilled Nursing Facility1

## 2021-01-12 DIAGNOSIS — E1165 Type 2 diabetes mellitus with hyperglycemia: Secondary | ICD-10-CM | POA: Diagnosis not present

## 2021-01-12 NOTE — Progress Notes (Signed)
Supervised Weight Loss Visit Bariatric Nutrition Education  Planned Surgery: sleeve  3 out of 6 SWL Appointments   NUTRITION ASSESSMENT   Anthropometrics  Start weight at [facility]: 274.1 lbs (date: 10/05/2020)  Weight: 275.7 lbs Height: 64 in BMI: 48.80 kg/m2     Clinical  Medical hx: GERD, kidney stones, diabetes, restless legs, IBS Medications: vitamin D, ozempic, biotin, b12 gummies, collagen, tumeric, allign  Labs: HbA1c of 6.9 on 10/02/20, Cortisol .3, T4 .94, TSH 2.45 Notable signs/symptoms: nausea, migraines Any previous deficiencies? Vitamin D  Lifestyle & Dietary Hx   Pt is accompanied by her seemingly supportive husband.  Pt arrives stating her back is acting up, pt states her hip shots are causing blood thinning so she has to stop those.  Pt states she has not been checking her blood sugars.  Pt states she has not started with a physical therapist due to insurance issues.  Pt states her appetite has not been great and sometimes skips meals.  Pts husband states he is really proud of her because she has been making more effort to be more mobile.   Estimated daily fluid intake: unknown oz Supplements:  Current average weekly physical activity: trying to move a little more   24-Hr Dietary Recall First Meal: cereal Snack:  Second Meal: eaten out Snack:  Third Meal: eaten out Snack:  Beverages: coffee + sugar free cream and (cut down) sugar, water, sweet tea  Estimated Energy Needs Calories: 1500   NUTRITION DIAGNOSIS  Overweight/obesity ([place]-3.3) related to past poor dietary habits and physical inactivity as evidenced by patient w/ planned sleeve gastrectomy surgery following dietary guidelines for continued weight loss.  NUTRITION INTERVENTION  Nutrition counseling (C-1) and education (E-2) to facilitate bariatric surgery goals. Educated pt on lifestyle changes correcting blood sugars.   Pre-Op Goals Progress & New Goals Continue: Practice CHEWING your food  (aim for applesauce consistency) Continue: Practice not drinking 15 minutes before, during, and 30 minutes after each meal and snack Continue: Aim for 64-100 ounces of FLUID daily (with at least half of fluid intake being plain water)  continue: avoid alcohol continue cut back on sugar in coffee; choose sugar free creamer instead  continue: drink one 32 ounces bottle by 12 and then the next 32 ounce by 4pm continue: try smoothies or greek yogurt (add metamucil to your smoothie) when no appetite continue: limit fruit to 3 servings per day continue: work on consistently eating throughout the day  NEW: think of eating as taking medicine you do not have an internal cue for that either  Handouts Provided Include    Learning Style & Readiness for Change Teaching method utilized: Visual & Auditory  Demonstrated degree of understanding via: Teach Back  Readiness Level: contemplative  Barriers to learning/adherence to lifestyle change: unknown at this time  RD's Notes for next Visit  Assess pts adherence to chosen goals   MONITORING & EVALUATION Dietary intake, weekly physical activity, body weight, and pre-op goals in 1 month.   Next Steps  Patient is to return to [facility] fro next SWL

## 2021-01-24 ENCOUNTER — Other Ambulatory Visit: Payer: Self-pay | Admitting: Family Medicine

## 2021-01-25 ENCOUNTER — Telehealth: Payer: Self-pay | Admitting: Family Medicine

## 2021-01-25 MED ORDER — AMPHETAMINE-DEXTROAMPHETAMINE 30 MG PO TABS: ORAL_TABLET | ORAL | 0 refills | Status: DC

## 2021-01-25 NOTE — Telephone Encounter (Signed)
Last filled 10/26/2020 Last OV 12/21/2020  Ok to fill?

## 2021-01-25 NOTE — Telephone Encounter (Signed)
Patient called needing a refill of amphetamine-dextroamphetamine (ADDERALL) 30 MG tablet to be sent to [id number] [address]  Please advise.

## 2021-02-10 ENCOUNTER — Other Ambulatory Visit: Payer: Self-pay

## 2021-02-10 ENCOUNTER — Encounter: Payer: Medicare Other | Attending: Surgery | Admitting: Skilled Nursing Facility1

## 2021-02-10 DIAGNOSIS — E1165 Type 2 diabetes mellitus with hyperglycemia: Secondary | ICD-10-CM | POA: Diagnosis not present

## 2021-02-10 NOTE — Progress Notes (Signed)
Supervised Weight Loss Visit Bariatric Nutrition Education  Planned Surgery: sleeve  4 out of 6 SWL Appointments   NUTRITION ASSESSMENT   Anthropometrics  Start weight at [facility]: 274.1 lbs (date: 10/05/2020)  Weight: 271.5 lbs Height: 64 in BMI: 46.6 kg/m2     Clinical  Medical hx: GERD, kidney stones, diabetes, restless legs, IBS Medications: vitamin D, ozempic, biotin, b12 gummies, collagen, tumeric, allign  Labs: HbA1c of 6.9 on 10/02/20, Cortisol .3, T4 .94, TSH 2.45 Notable signs/symptoms: nausea, migraines Any previous deficiencies? Vitamin D  Lifestyle & Dietary Hx   Pt is accompanied by her seemingly supportive husband.   Pt state she feels she has bene doing well with cutting back on sugar, drinking more water and moving more. Pt states when eating out her and her husband split the meal. Pt states he has been trying to work on eating throughout the day which has been tough. Pt states she will get busy and forget to eat in the day.  Pt state she wants to work with a PT due to reducing in mobility.   Pt states she has been feeling better since making the changes.   Estimated daily fluid intake: unknown oz Supplements:  Current average weekly physical activity: trying to move a little more   24-Hr Dietary Recall First Meal: cereal or granola bar or 2 eggs + bacon + pancake Snack:  Second Meal: eaten out Snack:  Third Meal: eaten out Snack:  Beverages: coffee + sugar free cream and (cut down) sugar, water, sweet tea  Estimated Energy Needs Calories: 1500   NUTRITION DIAGNOSIS  Overweight/obesity ([place]-3.3) related to past poor dietary habits and physical inactivity as evidenced by patient w/ planned sleeve gastrectomy surgery following dietary guidelines for continued weight loss.  NUTRITION INTERVENTION  Nutrition counseling (C-1) and education (E-2) to facilitate bariatric surgery goals. Educated pt on lifestyle changes correcting blood sugars.   Pre-Op Goals  Progress & New Goals Continue: Practice CHEWING your food (aim for applesauce consistency) Continue: Practice not drinking 15 minutes before, during, and 30 minutes after each meal and snack Continue: Aim for 64-100 ounces of FLUID daily (with at least half of fluid intake being plain water)  continue: avoid alcohol continue cut back on sugar in coffee; choose sugar free creamer instead  continue: drink one 32 ounces bottle by 12 and then the next 32 ounce by 4pm continue: try smoothies or greek yogurt (add metamucil to your smoothie) when no appetite continue: limit fruit to 3 servings per day continue: work on consistently eating throughout the day  continue: think of eating as taking medicine you do not have an internal cue for that either NEW: try an alarm on your watch to remind you to eat NEW: increase your movement throughout the day  Handouts Provided Include    Learning Style & Readiness for Change Teaching method utilized: Visual & Auditory  Demonstrated degree of understanding via: Teach Back  Readiness Level: contemplative  Barriers to learning/adherence to lifestyle change: unknown at this time  RD's Notes for next Visit  Assess pts adherence to chosen goals   MONITORING & EVALUATION Dietary intake, weekly physical activity, body weight, and pre-op goals in 1 month.   Next Steps  Patient is to return to [facility] fro next SWL

## 2021-02-24 DIAGNOSIS — M961 Postlaminectomy syndrome, not elsewhere classified: Secondary | ICD-10-CM | POA: Diagnosis not present

## 2021-02-24 DIAGNOSIS — M5416 Radiculopathy, lumbar region: Secondary | ICD-10-CM | POA: Diagnosis not present

## 2021-03-15 ENCOUNTER — Other Ambulatory Visit: Payer: Self-pay

## 2021-03-16 ENCOUNTER — Ambulatory Visit (INDEPENDENT_AMBULATORY_CARE_PROVIDER_SITE_OTHER): Payer: Medicare Other | Admitting: Family Medicine

## 2021-03-16 ENCOUNTER — Encounter: Payer: Self-pay | Admitting: Family Medicine

## 2021-03-16 VITALS — BP 132/88 | HR 123 | Temp 98.3°F | Wt 270.3 lb

## 2021-03-16 DIAGNOSIS — F419 Anxiety disorder, unspecified: Secondary | ICD-10-CM

## 2021-03-16 DIAGNOSIS — Z23 Encounter for immunization: Secondary | ICD-10-CM

## 2021-03-16 DIAGNOSIS — E785 Hyperlipidemia, unspecified: Secondary | ICD-10-CM

## 2021-03-16 DIAGNOSIS — B372 Candidiasis of skin and nail: Secondary | ICD-10-CM | POA: Diagnosis not present

## 2021-03-16 DIAGNOSIS — K219 Gastro-esophageal reflux disease without esophagitis: Secondary | ICD-10-CM

## 2021-03-16 MED ORDER — PANTOPRAZOLE SODIUM 40 MG PO TBEC: DELAYED_RELEASE_TABLET | ORAL | 1 refills | Status: DC

## 2021-03-16 MED ORDER — NYSTATIN 100000 UNIT/GM EX CREA: 1.0000 "application " | TOPICAL_CREAM | Freq: Two times a day (BID) | CUTANEOUS | 1 refills | Status: DC

## 2021-03-16 MED ORDER — CITALOPRAM HYDROBROMIDE 20 MG PO TABS: 20.0000 mg | ORAL_TABLET | Freq: Every day | ORAL | 3 refills | Status: DC

## 2021-03-16 NOTE — Progress Notes (Signed)
Established Patient Office Visit  Subjective:  Patient ID: [name] Sherman, female    DOB: 1961-02-10  Age: 60 y.o. MRN: [id number]  CC:  Chief Complaint  Patient presents with   Headache    Stress HAs    HPI [name] Sherman presents for medical follow-up.  She has medical problems including history of hypertension, migraine headaches, obstructive sleep apnea, GERD, hypothyroidism, chronic low back pain, ADD, hyperlipidemia, lumbar stenosis, morbid obesity.  She is trying to get approved for gastric bypass surgery later this year.  She has been visiting with nutritionist recently.  Has managed to lose about 18 pounds over the past few months.  Following very low-carb diet.  Recently ran out of Celexa.  She states she has frequent "stress headaches".  She notices fewer headaches and taking Celexa.  Takes 20 mg daily.  Requesting refills.  Longstanding history of GERD.  She is tried tapering off Protonix unsuccessfully in the past.  Needs refills.  No dysphagia.  She hopes to eventually reduce the Protonix after gastric bypass and weight loss  She has had multiple steroid injections per orthopedics.  Has had cushingoid features as previously noted and had screening for hypercortisolism which came back negative.  Dexamethasone suppression test was normal.  Currently not taking any oral steroids  Hyperlipidemia.  We had to switch from atorvastatin to rosuvastatin because of insurance issues.  Needs follow-up lipids.  Intermittent pruritic rash lower abdomen at skin fold.  Sometimes notices odor in this region.  Frequently pruritic.  Past Medical History:  Diagnosis Date   ADD (attention deficit disorder)    Anemia    Anxiety    Arthritis    shoulder, right (arthritis, tendonitis, bursitis)   Cancer (HCC)    cervical cancer (conization)    Chronic kidney disease    Complication of anesthesia    COVID-19 07/2019   Depression    Dyspnea 2020   GERD (gastroesophageal reflux disease)     History of cervical cancer    s/p  laser ablation of cervix 1980's   History of kidney stones    Hyperlipidemia    Hypertension    Hypothyroidism    IBS (irritable bowel syndrome)    Irregular heart rate    as a child, has never had any problems    Kidney stones    Left ureteral stone    Migraine    OSA (obstructive sleep apnea)    pt used cpap up until 2013 states lost wt and did not need anymore   PONV (postoperative nausea and vomiting)    Pre-diabetes    Restless legs    Sleep apnea    wears CPAP   Umbilical hernia without obstruction or gangrene    Urgency of urination    Vertigo    Vitamin D deficiency     Past Surgical History:  Procedure Laterality Date   ABDOMINAL HYSTERECTOMY     BACK SURGERY  04/27/2018   and in 2021 (Fusion)   BUNIONECTOMY Right 2004   COLONOSCOPY     CYSTOSCOPY W/ RETROGRADES Bilateral 05/26/2015   Procedure: CYSTOSCOPY WITH RETROGRADE PYELOGRAM;  Surgeon: Jerilee Field, MD;  Location: [facility];  Service: Urology;  Laterality: Bilateral;   CYSTOSCOPY W/ URETERAL STENT REMOVAL Left 05/26/2015   Procedure: CYSTOSCOPY WITH STENT REMOVAL;  Surgeon: Jerilee Field, MD;  Location: [facility];  Service: Urology;  Laterality: Left;   CYSTOSCOPY WITH RETROGRADE PYELOGRAM, URETEROSCOPY AND STENT PLACEMENT Left 05/01/2015  Procedure: CYSTOSCOPY WITH RETROGRADE PYELOGRAM AND STENT PLACEMENT;  Surgeon: Festus Aloe, MD;  Location: [facility];  Service: Urology;  Laterality: Left;   CYSTOSCOPY WITH STENT PLACEMENT Bilateral 05/26/2015   Procedure: CYSTOSCOPY WITH STENT PLACEMENT;  Surgeon: Festus Aloe, MD;  Location: [facility];  Service: Urology;  Laterality: Bilateral;   CYSTOSCOPY WITH URETEROSCOPY Bilateral 05/26/2015   Procedure: CYSTOSCOPY WITH URETEROSCOPY;  Surgeon: Festus Aloe, MD;  Location: [facility];  Service: Urology;  Laterality: Bilateral;    CYSTOSCOPY/URETEROSCOPY/HOLMIUM LASER/STENT PLACEMENT Right 06/09/2015   Procedure: CYSTOSCOPY/URETEROSCOPY/STENT PLACEMENT REMOVAL LEFT URETERAL STENT;  Surgeon: Festus Aloe, MD;  Location: [facility];  Service: Urology;  Laterality: Right;   DIAGNOSTIC LAPAROSCOPY     HERNIA REPAIR     HOLMIUM LASER APPLICATION Left 20/23/3435   Procedure: HOLMIUM LASER APPLICATION;  Surgeon: Festus Aloe, MD;  Location: [facility];  Service: Urology;  Laterality: Left;   HOLMIUM LASER APPLICATION Right 68/61/6837   Procedure: HOLMIUM LASER APPLICATION;  Surgeon: Festus Aloe, MD;  Location: [facility];  Service: Urology;  Laterality: Right;   INSERTION OF MESH N/A 09/05/2016   Procedure: INSERTION OF MESH;  Surgeon: [name], MD;  Location: [place];  Service: General;  Laterality: N/A;   LAPAROSCOPIC ASSISTED VAGINAL HYSTERECTOMY  04-23-2002   and Left Salpingoophorectomy/  Anterior Repair/  Tension Free Tape Sling placement   LASER ABLATION OF THE CERVIX  x2  1980's   TUBAL LIGATION  1980's   VENTRAL HERNIA REPAIR N/A 09/05/2016   Procedure: LAPAROSCOPIC VENTRAL HERNIA;  Surgeon: [name], MD;  Location: [place];  Service: General;  Laterality: N/A;    Family History  Problem Relation Age of Onset   Alcohol abuse Mother    Lung cancer Mother    Colon cancer Mother    Arthritis Father    Hyperlipidemia Father    Heart disease Father    Hypertension Father    COPD Father    Kidney disease Paternal Uncle    Cancer Maternal Grandmother        colon   Colon cancer Maternal Grandmother    Rectal cancer Maternal Grandmother    Cancer Paternal Grandmother        breast   Kidney cancer Sister        lung cancer   Cirrhosis Sister    Esophageal cancer Neg Hx    Stomach cancer Neg Hx     Social History   Socioeconomic History   Marital status: Legally Separated    Spouse name: Not on file   Number of children: Not on file   Years of education: Not on file    Highest education level: Not on file  Occupational History   Not on file  Tobacco Use   Smoking status: Never   Smokeless tobacco: Never  Vaping Use   Vaping Use: Never used  Substance and Sexual Activity   Alcohol use: Never    Comment: 1 x year   Drug use: Never   Sexual activity: Yes  Other Topics Concern   Not on file  Social History Narrative   Not on file   Social Determinants of Health   Financial Resource Strain: Not on file  Food Insecurity: Not on file  Transportation Needs: Not on file  Physical Activity: Not on file  Stress: Not on file  Social Connections: Not on file  Intimate Partner Violence: Not on file    Outpatient Medications Prior to Visit  Medication Sig Dispense Refill   Accu-Chek Softclix Lancets lancets Use as instructed 100 each 12   amphetamine-dextroamphetamine (ADDERALL) 30 MG tablet Take one tablet by mouth twice daily.  May refill in one month. 60 tablet 0   amphetamine-dextroamphetamine (ADDERALL) 30 MG tablet Take one tablet twice daily. 60 tablet 0   amphetamine-dextroamphetamine (ADDERALL) 30 MG tablet Take 1 tablet by mouth twice daily.  May refill in 2 months. 60 tablet 0   atorvastatin (LIPITOR) 10 MG tablet TAKE 1 TABLET BY MOUTH DAILY 90 tablet 2   Biotin 1000 MCG tablet Take 1,000 mcg by mouth 3 (three) times daily.     blood glucose meter kit and supplies Dispense based on patient and insurance preference. Use up to four times daily as directed. (FOR ICD-10 E10.9, E11.9). 1 each 0   cyclobenzaprine (FLEXERIL) 10 MG tablet Take 10 mg by mouth 2 (two) times daily.     furosemide (LASIX) 20 MG tablet TAKE 1 TABLET BY MOUTH ONCE A DAY AS NEEDED FOR SWELLING 30 tablet 1   glucose blood test strip Use as instructed 100 each 12   losartan-hydrochlorothiazide (HYZAAR) 100-12.5 MG tablet Take 1 tablet by mouth daily. 30 tablet 5   nortriptyline (PAMELOR) 25 MG capsule Take by mouth.     ondansetron (ZOFRAN-ODT) 8 MG disintegrating tablet  Take 1 tablet (8 mg total) by mouth every 8 (eight) hours as needed for nausea or vomiting. 60 tablet 3   oxyCODONE-acetaminophen (PERCOCET/ROXICET) 5-325 MG tablet Take 1-2 tablets by mouth every 4 (four) hours as needed for moderate pain or severe pain. 60 tablet 0   pramipexole (MIRAPEX) 0.125 MG tablet TAKE 1-2 TABLET EVERY EVENING AS NEEDED FOR RESTLESS LEG SYNDROME 60 tablet 3   Probiotic Product (ALIGN PO) Take 1 capsule by mouth daily.     rosuvastatin (CRESTOR) 10 MG tablet Take 1 tablet (10 mg total) by mouth daily. 90 tablet 1   Semaglutide, 1 MG/DOSE, (OZEMPIC, 1 MG/DOSE,) 2 MG/1.5ML SOPN Inject 1 mg into the skin once a week. 3 mL 5   SUMAtriptan (IMITREX) 100 MG tablet May repeat in 2 hours if headache persists or recurs.  Do not take more than 2 in 24 hours (Patient taking differently: Take 100 mg by mouth every 2 (two) hours as needed for migraine. May repeat in 2 hours if headache persists or recurs.  Do not take more than 2 in 24 hours) 10 tablet 11   SYNTHROID 50 MCG tablet TAKE 1 TABLET BY MOUTH EVERY DAY 30 tablet 2   Turmeric-Ginger 135-6 MG CHEW Chew 2 each by mouth daily.     Vitamin D, Ergocalciferol, (DRISDOL) 1.25 MG (50000 UNIT) CAPS capsule TAKE 1 CAPSULE BY MOUTH 1 TIME A WEEK (Patient taking differently: Take 50,000 Units by mouth every Monday. TAKE 1 CAPSULE BY MOUTH 1 TIME A WEEK) 12 capsule 3   citalopram (CELEXA) 20 MG tablet Take 1 tablet (20 mg total) by mouth daily. 90 tablet 3   pantoprazole (PROTONIX) 40 MG tablet TAKE 1 TABLET(40 MG) BY MOUTH TWICE DAILY 60 tablet 1   No facility-administered medications prior to visit.    Allergies  Allergen Reactions   Keflex [Cephalexin] Swelling    Tongue swelling    ROS Review of Systems  Constitutional:  Negative for chills and fever.  Respiratory:  Negative for cough.   Cardiovascular:  Negative for chest pain.  Gastrointestinal:  Negative for abdominal pain.  Musculoskeletal:  Positive for arthralgias and  back  pain.     Objective:    Physical Exam Vitals reviewed.  Cardiovascular:     Rate and Rhythm: Regular rhythm.  Pulmonary:     Effort: Pulmonary effort is normal.     Breath sounds: Normal breath sounds. No wheezing or rales.  Musculoskeletal:        General: No swelling.  Neurological:     Mental Status: She is alert.    BP 132/88 (BP Location: Right Arm, Patient Position: Sitting, Cuff Size: Large)   Pulse (!) 123   Temp 98.3 F (36.8 C) (Oral)   Wt 270 lb 4.8 oz (122.6 kg)   SpO2 97%   BMI 46.40 kg/m  Wt Readings from Last 3 Encounters:  03/16/21 270 lb 4.8 oz (122.6 kg)  02/10/21 271 lb 8 oz (123.2 kg)  01/12/21 275 lb 11.2 oz (125.1 kg)     Health Maintenance Due  Topic Date Due   FOOT EXAM  Never done   OPHTHALMOLOGY EXAM  Never done   Zoster Vaccines- Shingrix (1 of 2) Never done   COVID-19 Vaccine (2 - Pfizer series) 07/22/2020   INFLUENZA VACCINE  01/25/2021    There are no preventive care reminders to display for this patient.  Lab Results  Component Value Date   TSH 2.45 12/07/2020   Lab Results  Component Value Date   WBC 13.1 (H) 10/02/2020   HGB 11.7 (L) 10/02/2020   HCT 36.5 10/02/2020   MCV 77.9 (L) 10/02/2020   PLT 460.0 (H) 10/02/2020   Lab Results  Component Value Date   NA 140 12/07/2020   K 3.9 12/07/2020   CO2 26 12/07/2020   GLUCOSE 144 (H) 12/07/2020   BUN 14 12/07/2020   CREATININE 0.86 12/07/2020   BILITOT 0.4 03/23/2020   ALKPHOS 138 (H) 03/22/2019   AST 17 03/23/2020   ALT 21 03/23/2020   PROT 6.8 03/23/2020   ALBUMIN 3.5 03/22/2019   CALCIUM 8.8 12/07/2020   ANIONGAP 7 02/04/2020   GFR 74.60 12/07/2020   Lab Results  Component Value Date   CHOL 156 03/23/2020   Lab Results  Component Value Date   HDL 45 (L) 03/23/2020   Lab Results  Component Value Date   LDLCALC 86 03/23/2020   Lab Results  Component Value Date   TRIG 155 (H) 03/23/2020   Lab Results  Component Value Date   CHOLHDL 3.5  03/23/2020   Lab Results  Component Value Date   HGBA1C 6.9 (H) 10/02/2020      Assessment & Plan:   #1 history of chronic anxiety stable on Celexa 20 mg daily.  Patient requesting refills.  Refills provided for 1 year.  Patient has noted fewer headaches and taking Celexa  #2 GERD.  Currently stable on Protonix -Refill Protonix for 1 year.  Hopefully can taper back when she achieves further weight loss  #3 hyperlipidemia.  Recent change from atorvastatin to rosuvastatin because of insurance.  Future labs have been ordered.  Lab not present currently and she will return for that.  #4 probable Candida dermatitis lower abdomen.  Nystatin cream use twice daily as needed and keep area dry as possible.  #5 health maintenance.  Patient would like to go ahead with Shingrix vaccine.  This was given and recommend booster Shingrix in 2 to 6 months   Meds ordered this encounter  Medications   citalopram (CELEXA) 20 MG tablet    Sig: Take 1 tablet (20 mg total) by mouth daily.  Dispense:  90 tablet    Refill:  3   pantoprazole (PROTONIX) 40 MG tablet    Sig: TAKE 1 TABLET(40 MG) BY MOUTH TWICE DAILY    Dispense:  180 tablet    Refill:  1   nystatin cream (MYCOSTATIN)    Sig: Apply 1 application topically 2 (two) times daily.    Dispense:  30 g    Refill:  1    Follow-up: No follow-ups on file.    Carolann Littler, MD

## 2021-03-16 NOTE — Addendum Note (Signed)
Addended by: Anderson Malta on: 03/16/2021 01:35 PM   Modules accepted: Orders

## 2021-03-16 NOTE — Patient Instructions (Signed)
Remember to get second Shingrix vaccine in 2 months  Return for labs- lipid and hepatic.

## 2021-03-17 ENCOUNTER — Other Ambulatory Visit: Payer: Self-pay

## 2021-03-17 ENCOUNTER — Encounter: Payer: Medicare Other | Attending: Surgery | Admitting: Skilled Nursing Facility1

## 2021-03-17 DIAGNOSIS — E1165 Type 2 diabetes mellitus with hyperglycemia: Secondary | ICD-10-CM | POA: Insufficient documentation

## 2021-03-17 NOTE — Progress Notes (Signed)
Supervised Weight Loss Visit Bariatric Nutrition Education  Planned Surgery: sleeve  5 out of 6 SWL Appointments   NUTRITION ASSESSMENT   Anthropometrics  Start weight at [facility]: 274.1 lbs (date: 10/05/2020)  Weight: 271.5 lbs Height: 64 in BMI: 46.52 kg/m2     Clinical  Medical hx: GERD, kidney stones, diabetes, restless legs, IBS Medications: vitamin D, ozempic, biotin, b12 gummies, collagen, tumeric, allign  Labs: HbA1c of 6.9 on 10/02/20, Cortisol .3, T4 .94, TSH 2.45 Notable signs/symptoms: nausea, migraines Any previous deficiencies? Vitamin D  Lifestyle & Dietary Hx    Pt states she has been feeling better since making the changes.   Pt states she has cut out all sugar from her coffee and has cut back a lot on her sweetness in her tea. Pt states she has been working on increasing her activity throughout the week. Pt states eating throughout the day has been difficult. Pt states she and her husband worked on a budget yesterday so is more motivated to cut back on eating out.   Estimated daily fluid intake: unknown oz Supplements:  Current average weekly physical activity: trying to move a little more   24-Hr Dietary Recall First Meal: cereal or granola bar or 2 eggs + bacon + pancake or cheerios Snack:  Second Meal: eaten out Snack: pretzels Third Meal: eaten out Snack:  Beverages: coffee + sugar free cream and (cut down) sugar, water, sweet tea  Estimated Energy Needs Calories: 1500   NUTRITION DIAGNOSIS  Overweight/obesity ([place]-3.3) related to past poor dietary habits and physical inactivity as evidenced by patient w/ planned sleeve gastrectomy surgery following dietary guidelines for continued weight loss.  NUTRITION INTERVENTION  Nutrition counseling (C-1) and education (E-2) to facilitate bariatric surgery goals. Educated pt on lifestyle changes correcting blood sugars.   Pre-Op Goals Progress & New Goals Continue: Practice CHEWING your food (aim for  applesauce consistency) Continue: Practice not drinking 15 minutes before, during, and 30 minutes after each meal and snack Continue: Aim for 64-100 ounces of FLUID daily (with at least half of fluid intake being plain water)  continue: avoid alcohol continue cut back on sugar in coffee; choose sugar free creamer instead  continue: drink one 32 ounces bottle by 12 and then the next 32 ounce by 4pm continue: try smoothies or greek yogurt (add metamucil to your smoothie) when no appetite continue: limit fruit to 3 servings per day continue: work on consistently eating throughout the day  continue: think of eating as taking medicine you do not have an internal cue for that either continue: try an alarm on your watch to remind you to eat continue: increase your movement throughout the day NEW: limit eating out to once a week  Handouts Provided Include    Learning Style & Readiness for Change Teaching method utilized: Visual & Auditory  Demonstrated degree of understanding via: Teach Back  Readiness Level: contemplative  Barriers to learning/adherence to lifestyle change: unknown at this time  RD's Notes for next Visit  Assess pts adherence to chosen goals   MONITORING & EVALUATION Dietary intake, weekly physical activity, body weight, and pre-op goals in 1 month.   Next Steps  Patient is to return to [facility] fro next [facility]

## 2021-03-18 ENCOUNTER — Ambulatory Visit: Payer: Medicare Other | Admitting: Skilled Nursing Facility1

## 2021-03-23 ENCOUNTER — Other Ambulatory Visit: Payer: Self-pay | Admitting: Family Medicine

## 2021-03-26 DIAGNOSIS — M25551 Pain in right hip: Secondary | ICD-10-CM | POA: Diagnosis not present

## 2021-03-26 DIAGNOSIS — M25552 Pain in left hip: Secondary | ICD-10-CM | POA: Diagnosis not present

## 2021-03-26 DIAGNOSIS — T148XXA Other injury of unspecified body region, initial encounter: Secondary | ICD-10-CM | POA: Diagnosis not present

## 2021-04-01 DIAGNOSIS — G4733 Obstructive sleep apnea (adult) (pediatric): Secondary | ICD-10-CM | POA: Diagnosis not present

## 2021-04-02 ENCOUNTER — Telehealth: Payer: Self-pay | Admitting: Hematology

## 2021-04-02 ENCOUNTER — Other Ambulatory Visit: Payer: Self-pay

## 2021-04-02 ENCOUNTER — Encounter: Payer: Self-pay | Admitting: Family Medicine

## 2021-04-02 DIAGNOSIS — G4733 Obstructive sleep apnea (adult) (pediatric): Secondary | ICD-10-CM | POA: Diagnosis not present

## 2021-04-02 MED ORDER — ONDANSETRON HCL 8 MG PO TABS: 8.0000 mg | ORAL_TABLET | Freq: Three times a day (TID) | ORAL | 1 refills | Status: DC | PRN

## 2021-04-02 NOTE — Telephone Encounter (Signed)
Scheduled appt per 10/4 referral. Pt is aware of appt date and time.

## 2021-04-08 ENCOUNTER — Inpatient Hospital Stay: Payer: Medicare Other

## 2021-04-08 ENCOUNTER — Inpatient Hospital Stay: Payer: Medicare Other | Attending: Hematology | Admitting: Hematology

## 2021-04-08 ENCOUNTER — Other Ambulatory Visit: Payer: Self-pay

## 2021-04-08 VITALS — BP 159/86 | HR 111 | Temp 99.0°F | Resp 19 | Ht 64.0 in | Wt 265.9 lb

## 2021-04-08 DIAGNOSIS — Z8379 Family history of other diseases of the digestive system: Secondary | ICD-10-CM | POA: Diagnosis not present

## 2021-04-08 DIAGNOSIS — Z8541 Personal history of malignant neoplasm of cervix uteri: Secondary | ICD-10-CM | POA: Diagnosis not present

## 2021-04-08 DIAGNOSIS — Z8616 Personal history of COVID-19: Secondary | ICD-10-CM | POA: Diagnosis not present

## 2021-04-08 DIAGNOSIS — Z8261 Family history of arthritis: Secondary | ICD-10-CM | POA: Diagnosis not present

## 2021-04-08 DIAGNOSIS — D691 Qualitative platelet defects: Secondary | ICD-10-CM | POA: Diagnosis not present

## 2021-04-08 DIAGNOSIS — Z801 Family history of malignant neoplasm of trachea, bronchus and lung: Secondary | ICD-10-CM | POA: Insufficient documentation

## 2021-04-08 DIAGNOSIS — Z881 Allergy status to other antibiotic agents status: Secondary | ICD-10-CM | POA: Insufficient documentation

## 2021-04-08 DIAGNOSIS — F32A Depression, unspecified: Secondary | ICD-10-CM | POA: Diagnosis not present

## 2021-04-08 DIAGNOSIS — F419 Anxiety disorder, unspecified: Secondary | ICD-10-CM | POA: Insufficient documentation

## 2021-04-08 DIAGNOSIS — Z800 Family history of malignant neoplasm of digestive organs: Secondary | ICD-10-CM | POA: Insufficient documentation

## 2021-04-08 DIAGNOSIS — Z87442 Personal history of urinary calculi: Secondary | ICD-10-CM | POA: Insufficient documentation

## 2021-04-08 DIAGNOSIS — Z6841 Body Mass Index (BMI) 40.0 and over, adult: Secondary | ICD-10-CM | POA: Insufficient documentation

## 2021-04-08 DIAGNOSIS — K219 Gastro-esophageal reflux disease without esophagitis: Secondary | ICD-10-CM | POA: Diagnosis not present

## 2021-04-08 DIAGNOSIS — R233 Spontaneous ecchymoses: Secondary | ICD-10-CM | POA: Diagnosis not present

## 2021-04-08 DIAGNOSIS — E785 Hyperlipidemia, unspecified: Secondary | ICD-10-CM | POA: Insufficient documentation

## 2021-04-08 DIAGNOSIS — I129 Hypertensive chronic kidney disease with stage 1 through stage 4 chronic kidney disease, or unspecified chronic kidney disease: Secondary | ICD-10-CM | POA: Diagnosis not present

## 2021-04-08 DIAGNOSIS — Z811 Family history of alcohol abuse and dependence: Secondary | ICD-10-CM | POA: Diagnosis not present

## 2021-04-08 DIAGNOSIS — Z841 Family history of disorders of kidney and ureter: Secondary | ICD-10-CM | POA: Insufficient documentation

## 2021-04-08 DIAGNOSIS — E039 Hypothyroidism, unspecified: Secondary | ICD-10-CM | POA: Insufficient documentation

## 2021-04-08 DIAGNOSIS — Z836 Family history of other diseases of the respiratory system: Secondary | ICD-10-CM | POA: Diagnosis not present

## 2021-04-08 DIAGNOSIS — Z90721 Acquired absence of ovaries, unilateral: Secondary | ICD-10-CM | POA: Insufficient documentation

## 2021-04-08 DIAGNOSIS — K589 Irritable bowel syndrome without diarrhea: Secondary | ICD-10-CM | POA: Insufficient documentation

## 2021-04-08 DIAGNOSIS — Z8249 Family history of ischemic heart disease and other diseases of the circulatory system: Secondary | ICD-10-CM | POA: Insufficient documentation

## 2021-04-08 DIAGNOSIS — Z8349 Family history of other endocrine, nutritional and metabolic diseases: Secondary | ICD-10-CM | POA: Insufficient documentation

## 2021-04-08 DIAGNOSIS — Z8051 Family history of malignant neoplasm of kidney: Secondary | ICD-10-CM | POA: Insufficient documentation

## 2021-04-08 DIAGNOSIS — M5416 Radiculopathy, lumbar region: Secondary | ICD-10-CM | POA: Diagnosis not present

## 2021-04-08 LAB — FIBRINOGEN: Fibrinogen: 605 mg/dL — ABNORMAL HIGH (ref 210–475)

## 2021-04-08 LAB — CBC WITH DIFFERENTIAL/PLATELET
Abs Immature Granulocytes: 0.14 10*3/uL — ABNORMAL HIGH (ref 0.00–0.07)
Basophils Absolute: 0.1 10*3/uL (ref 0.0–0.1)
Basophils Relative: 1 %
Eosinophils Absolute: 0.1 10*3/uL (ref 0.0–0.5)
Eosinophils Relative: 0 %
HCT: 38.8 % (ref 36.0–46.0)
Hemoglobin: 12.4 g/dL (ref 12.0–15.0)
Immature Granulocytes: 1 %
Lymphocytes Relative: 10 %
Lymphs Abs: 2.1 10*3/uL (ref 0.7–4.0)
MCH: 25.3 pg — ABNORMAL LOW (ref 26.0–34.0)
MCHC: 32 g/dL (ref 30.0–36.0)
MCV: 79 fL — ABNORMAL LOW (ref 80.0–100.0)
Monocytes Absolute: 1 10*3/uL (ref 0.1–1.0)
Monocytes Relative: 5 %
Neutro Abs: 17.1 10*3/uL — ABNORMAL HIGH (ref 1.7–7.7)
Neutrophils Relative %: 83 %
Platelets: 461 10*3/uL — ABNORMAL HIGH (ref 150–400)
RBC: 4.91 MIL/uL (ref 3.87–5.11)
RDW: 14.8 % (ref 11.5–15.5)
WBC: 20.5 10*3/uL — ABNORMAL HIGH (ref 4.0–10.5)
nRBC: 0 % (ref 0.0–0.2)

## 2021-04-08 LAB — CMP (CANCER CENTER ONLY)
ALT: 31 U/L (ref 0–44)
AST: 24 U/L (ref 15–41)
Albumin: 3.6 g/dL (ref 3.5–5.0)
Alkaline Phosphatase: 141 U/L — ABNORMAL HIGH (ref 38–126)
Anion gap: 7 (ref 5–15)
BUN: 19 mg/dL (ref 6–20)
CO2: 26 mmol/L (ref 22–32)
Calcium: 9.5 mg/dL (ref 8.9–10.3)
Chloride: 102 mmol/L (ref 98–111)
Creatinine: 0.9 mg/dL (ref 0.44–1.00)
GFR, Estimated: >60 mL/min (ref >60)
Glucose, Bld: 152 mg/dL — ABNORMAL HIGH (ref 70–99)
Potassium: 3.9 mmol/L (ref 3.5–5.1)
Sodium: 135 mmol/L (ref 135–145)
Total Bilirubin: 0.5 mg/dL (ref 0.3–1.2)
Total Protein: 8.2 g/dL — ABNORMAL HIGH (ref 6.5–8.1)

## 2021-04-08 LAB — PROTIME-INR
INR: 0.9 (ref 0.8–1.2)
Prothrombin Time: 12.4 seconds (ref 11.4–15.2)

## 2021-04-08 LAB — FERRITIN: Ferritin: 60 ng/mL (ref 11–307)

## 2021-04-08 LAB — PLATELET FUNCTION ASSAY: Collagen / Epinephrine: 141 seconds (ref 0–193)

## 2021-04-08 LAB — APTT: aPTT: 26 seconds (ref 24–36)

## 2021-04-08 NOTE — Patient Instructions (Signed)
Thank you for choosing [facility] to provide your care.   Should you have questions after your visit to the [facility] ([facility]), please contact this office at [phone] between 8:30 AM and 4:30 PM.  Voice mails left after 4:00 PM may not be returned until the following business day.  Calls received after 4:30 PM will be answered by an off-site Nurse Triage Line.    Prescription Refills:  Please have your pharmacy contact us directly for most prescription requests.  Contact the office directly for refills of narcotics (pain medications). Allow 48-72 hours for refills.  Appointments: Please contact the [facility] scheduling department [phone] for questions regarding [facility] appointment scheduling.  Contact the schedulers with any scheduling changes so that your appointment can be rescheduled in a timely manner.   Central Scheduling for [facility] [phone] - Call to schedule procedures such as PET scans, CT scans, MRI, Ultrasound, etc.  To afford each patient quality time with our providers, please arrive 30 minutes before your scheduled appointment time.  If you arrive late for your appointment, you may be asked to reschedule.  We strive to give you quality time with our providers, and arriving late affects you and other patients whose appointments are after yours. If you are a no show for multiple scheduled visits, you may be dismissed from the clinic at the providers discretion.     Resources: [facility] Social Workers [phone] for additional information on assistance programs or assistance connecting with community support programs   [place] DSS  [phone]: Information regarding food stamps, Medicaid, and utility assistance [place] [employer] [place] [phone]   [place] [employer]'s shared-ride transportation service for eligible riders who have a disability that prevents them from riding the fixed route bus.   Medicare Rights Center 800-333-4114  Helps people with Medicare understand their rights and benefits, navigate the Medicare system, and secure the quality healthcare they deserve [employer] 800-227-2345 Assists patients locate various types of support and financial assistance Cancer Care: 1-800-813-HOPE (4673) Provides financial assistance, online support groups, medication/co-pay assistance.   Transportation Assistance for appointments at [facility]: Transportation Coordinator [phone]  Again, thank you for choosing  [facility] Center for your care.       

## 2021-04-08 NOTE — Progress Notes (Deleted)
Marland Kitchen   HEMATOLOGY/ONCOLOGY CONSULTATION NOTE  Date of Service: 04/08/2021  Patient Care Team: [name], MD as PCP - General (Family Medicine)  CHIEF COMPLAINTS/PURPOSE OF CONSULTATION:  ***  HISTORY OF PRESENTING ILLNESS:  [name] Sherman is a wonderful 60 y.o. female who has been referred to [place] by Dr Merryl Hacker for evaluation and management of ***  MEDICAL HISTORY:  Past Medical History:  Diagnosis Date   ADD (attention deficit disorder)    Anemia    Anxiety    Arthritis    shoulder, right (arthritis, tendonitis, bursitis)   Cancer ([place])    cervical cancer ([place])    Chronic kidney disease    Complication of anesthesia    COVID-19 07/2019   Depression    Dyspnea 2020   GERD (gastroesophageal reflux disease)    History of cervical cancer    s/p  laser ablation of cervix 1980's   History of kidney stones    Hyperlipidemia    Hypertension    Hypothyroidism    IBS (irritable bowel syndrome)    Irregular heart rate    as a child, has never had any problems    Kidney stones    Left ureteral stone    Migraine    OSA (obstructive sleep apnea)    pt used cpap up until 2013 states lost wt and did not need anymore   PONV (postoperative nausea and vomiting)    Pre-diabetes    Restless legs    Sleep apnea    wears CPAP   Umbilical hernia without obstruction or gangrene    Urgency of urination    Vertigo    Vitamin D deficiency     SURGICAL HISTORY: Past Surgical History:  Procedure Laterality Date   ABDOMINAL HYSTERECTOMY     BACK SURGERY  04/27/2018   and in 2021 (Fusion)   BUNIONECTOMY Right 2004   COLONOSCOPY     CYSTOSCOPY W/ RETROGRADES Bilateral 05/26/2015   Procedure: CYSTOSCOPY WITH RETROGRADE PYELOGRAM;  Surgeon: Festus Aloe, MD;  Location: [facility];  Service: Urology;  Laterality: Bilateral;   CYSTOSCOPY W/ URETERAL STENT REMOVAL Left 05/26/2015   Procedure: CYSTOSCOPY WITH STENT REMOVAL;  Surgeon: Festus Aloe, MD;   Location: [facility];  Service: Urology;  Laterality: Left;   CYSTOSCOPY WITH RETROGRADE PYELOGRAM, URETEROSCOPY AND STENT PLACEMENT Left 05/01/2015   Procedure: CYSTOSCOPY WITH RETROGRADE PYELOGRAM AND STENT PLACEMENT;  Surgeon: Festus Aloe, MD;  Location: [facility];  Service: Urology;  Laterality: Left;   CYSTOSCOPY WITH STENT PLACEMENT Bilateral 05/26/2015   Procedure: CYSTOSCOPY WITH STENT PLACEMENT;  Surgeon: Festus Aloe, MD;  Location: [facility];  Service: Urology;  Laterality: Bilateral;   CYSTOSCOPY WITH URETEROSCOPY Bilateral 05/26/2015   Procedure: CYSTOSCOPY WITH URETEROSCOPY;  Surgeon: Festus Aloe, MD;  Location: [facility];  Service: Urology;  Laterality: Bilateral;   CYSTOSCOPY/URETEROSCOPY/HOLMIUM LASER/STENT PLACEMENT Right 06/09/2015   Procedure: CYSTOSCOPY/URETEROSCOPY/STENT PLACEMENT REMOVAL LEFT URETERAL STENT;  Surgeon: Festus Aloe, MD;  Location: [facility];  Service: Urology;  Laterality: Right;   DIAGNOSTIC LAPAROSCOPY     HERNIA REPAIR     HOLMIUM LASER APPLICATION Left 40/98/1191   Procedure: HOLMIUM LASER APPLICATION;  Surgeon: Festus Aloe, MD;  Location: [facility];  Service: Urology;  Laterality: Left;   HOLMIUM LASER APPLICATION Right 47/82/9562   Procedure: HOLMIUM LASER APPLICATION;  Surgeon: Festus Aloe, MD;  Location: [facility];  Service: Urology;  Laterality: Right;   INSERTION OF MESH N/A 09/05/2016  Procedure: INSERTION OF MESH;  Surgeon: [name], MD;  Location: [place];  Service: General;  Laterality: N/A;   LAPAROSCOPIC ASSISTED VAGINAL HYSTERECTOMY  04-23-2002   and Left Salpingoophorectomy/  Anterior Repair/  Tension Free Tape Sling placement   LASER ABLATION OF THE CERVIX  x2  1980's   TUBAL LIGATION  1980's   VENTRAL HERNIA REPAIR N/A 09/05/2016   Procedure: LAPAROSCOPIC VENTRAL HERNIA;  Surgeon: [name], MD;  Location: [place];  Service: General;   Laterality: N/A;    SOCIAL HISTORY: Social History   Socioeconomic History   Marital status: Legally Separated    Spouse name: Not on file   Number of children: Not on file   Years of education: Not on file   Highest education level: Not on file  Occupational History   Not on file  Tobacco Use   Smoking status: Never   Smokeless tobacco: Never  Vaping Use   Vaping Use: Never used  Substance and Sexual Activity   Alcohol use: Never    Comment: 1 x year   Drug use: Never   Sexual activity: Yes  Other Topics Concern   Not on file  Social History Narrative   Not on file   Social Determinants of Health   Financial Resource Strain: Not on file  Food Insecurity: Not on file  Transportation Needs: Not on file  Physical Activity: Not on file  Stress: Not on file  Social Connections: Not on file  Intimate Partner Violence: Not on file    FAMILY HISTORY: Family History  Problem Relation Age of Onset   Alcohol abuse Mother    Lung cancer Mother    Colon cancer Mother    Arthritis Father    Hyperlipidemia Father    Heart disease Father    Hypertension Father    COPD Father    Kidney disease Paternal Uncle    Cancer Maternal Grandmother        colon   Colon cancer Maternal Grandmother    Rectal cancer Maternal Grandmother    Cancer Paternal Grandmother        breast   Kidney cancer Sister        lung cancer   Cirrhosis Sister    Esophageal cancer Neg Hx    Stomach cancer Neg Hx     ALLERGIES:  is allergic to keflex [cephalexin].  MEDICATIONS:  Current Outpatient Medications  Medication Sig Dispense Refill   Accu-Chek Softclix Lancets lancets Use as instructed 100 each 12   amphetamine-dextroamphetamine (ADDERALL) 30 MG tablet Take one tablet by mouth twice daily.  May refill in one month. 60 tablet 0   amphetamine-dextroamphetamine (ADDERALL) 30 MG tablet Take one tablet twice daily. 60 tablet 0   amphetamine-dextroamphetamine (ADDERALL) 30 MG tablet Take 1  tablet by mouth twice daily.  May refill in 2 months. 60 tablet 0   atorvastatin (LIPITOR) 10 MG tablet TAKE 1 TABLET BY MOUTH DAILY 90 tablet 2   Biotin 1000 MCG tablet Take 1,000 mcg by mouth 3 (three) times daily.     blood glucose meter kit and supplies Dispense based on patient and insurance preference. Use up to four times daily as directed. (FOR ICD-10 E10.9, E11.9). 1 each 0   citalopram (CELEXA) 20 MG tablet Take 1 tablet (20 mg total) by mouth daily. 90 tablet 3   cyclobenzaprine (FLEXERIL) 10 MG tablet Take 10 mg by mouth 2 (two) times daily.     furosemide (LASIX) 20  MG tablet TAKE 1 TABLET BY MOUTH ONCE A DAY AS NEEDED FOR SWELLING 30 tablet 1   glucose blood test strip Use as instructed 100 each 12   losartan-hydrochlorothiazide (HYZAAR) 100-12.5 MG tablet Take 1 tablet by mouth daily. 30 tablet 5   nortriptyline (PAMELOR) 25 MG capsule Take by mouth.     nystatin cream (MYCOSTATIN) Apply 1 application topically 2 (two) times daily. 30 g 1   ondansetron (ZOFRAN) 8 MG tablet Take 1 tablet (8 mg total) by mouth every 8 (eight) hours as needed for nausea or vomiting. 60 tablet 1   oxyCODONE-acetaminophen (PERCOCET/ROXICET) 5-325 MG tablet Take 1-2 tablets by mouth every 4 (four) hours as needed for moderate pain or severe pain. 60 tablet 0   pantoprazole (PROTONIX) 40 MG tablet TAKE 1 TABLET(40 MG) BY MOUTH TWICE DAILY 180 tablet 1   pramipexole (MIRAPEX) 0.125 MG tablet TAKE 1-2 TABLET EVERY EVENING AS NEEDED FOR RESTLESS LEG SYNDROME 60 tablet 3   Probiotic Product (ALIGN PO) Take 1 capsule by mouth daily.     rosuvastatin (CRESTOR) 10 MG tablet Take 1 tablet (10 mg total) by mouth daily. 90 tablet 1   Semaglutide, 1 MG/DOSE, (OZEMPIC, 1 MG/DOSE,) 2 MG/1.5ML SOPN Inject 1 mg into the skin once a week. 3 mL 5   SUMAtriptan (IMITREX) 100 MG tablet May repeat in 2 hours if headache persists or recurs.  Do not take more than 2 in 24 hours (Patient taking differently: Take 100 mg by mouth  every 2 (two) hours as needed for migraine. May repeat in 2 hours if headache persists or recurs.  Do not take more than 2 in 24 hours) 10 tablet 11   SYNTHROID 50 MCG tablet TAKE 1 TABLET BY MOUTH EVERY DAY 30 tablet 2   Turmeric-Ginger 135-6 MG CHEW Chew 2 each by mouth daily.     Vitamin D, Ergocalciferol, (DRISDOL) 1.25 MG (50000 UNIT) CAPS capsule TAKE 1 CAPSULE BY MOUTH 1 TIME A WEEK (Patient taking differently: Take 50,000 Units by mouth every Monday. TAKE 1 CAPSULE BY MOUTH 1 TIME A WEEK) 12 capsule 3   No current facility-administered medications for this visit.    REVIEW OF SYSTEMS:    10 Point review of Systems was done is negative except as noted above.  PHYSICAL EXAMINATION: ECOG PERFORMANCE STATUS: {CHL ONC ECOG FB:[id number]}  . Vitals:   04/08/21 1128  BP: (!) 159/86  Pulse: (!) 111  Resp: 19  Temp: 99 F (37.2 C)  SpO2: 98%   Filed Weights   04/08/21 1128  Weight: 265 lb 14.4 oz (120.6 kg)   .Body mass index is 45.64 kg/m.  GENERAL:alert, in no acute distress and comfortable SKIN: no acute rashes, no significant lesions EYES: conjunctiva are pink and non-injected, sclera anicteric OROPHARYNX: MMM, no exudates, no oropharyngeal erythema or ulceration NECK: supple, no JVD LYMPH:  no palpable lymphadenopathy in the cervical, axillary or inguinal regions LUNGS: clear to auscultation b/l with normal respiratory effort HEART: regular rate & rhythm ABDOMEN:  normoactive bowel sounds , non tender, not distended. Extremity: no pedal edema PSYCH: alert & oriented x 3 with fluent speech NEURO: no focal motor/sensory deficits  LABORATORY DATA:  I have reviewed the data as listed  . CBC Latest Ref Rng & Units 10/02/2020 03/23/2020 02/04/2020  WBC 4.0 - 10.5 K/uL 13.1(H) 9.6 18.3(H)  Hemoglobin 12.0 - 15.0 g/dL 11.7(L) 10.7(L) 9.2(L)  Hematocrit 36.0 - 46.0 % 36.5 35.8 29.5(L)  Platelets 150.0 - 400.0 K/uL  460.0(H) 477(H) 360    . CMP Latest Ref Rng & Units  12/07/2020 03/23/2020 02/04/2020  Glucose 70 - 99 mg/dL 144(H) 131(H) 93  BUN 6 - 23 mg/dL $Remove'14 12 17  'ZhvTqZp$ Creatinine 0.40 - 1.20 mg/dL 0.86 0.99 0.98  Sodium 135 - 145 mEq/L 140 139 141  Potassium 3.5 - 5.1 mEq/L 3.9 3.4(L) 4.4  Chloride 96 - 112 mEq/L 104 102 104  CO2 19 - 32 mEq/L $Remove'26 29 30  'zhmDcFC$ Calcium 8.4 - 10.5 mg/dL 8.8 9.5 8.6(L)  Total Protein 6.1 - 8.1 g/dL - 6.8 -  Total Bilirubin 0.2 - 1.2 mg/dL - 0.4 -  Alkaline Phos 39 - 117 U/L - - -  AST 10 - 35 U/L - 17 -  ALT 6 - 29 U/L - 21 -     RADIOGRAPHIC STUDIES: I have personally reviewed the radiological images as listed and agreed with the findings in the report. No results found.  ASSESSMENT & PLAN:  ***  All of the patients questions were answered with apparent satisfaction. The patient knows to call the clinic with any problems, questions or concerns.  I spent {CHL ONC TIME VISIT - REQJC:[id number]} counseling the patient face to face. The total time spent in the appointment was {CHL ONC TIME VISIT - RJFKD:[id number]} and more than 50% was on counseling and direct patient cares.    Sullivan Lone MD [place] AAHIVMS [facility] Hematology/Oncology Physician [facility]  (Office):       [phone] (Work cell):  [phone] (Fax):           [phone]  04/08/2021 11:31 AM

## 2021-04-09 ENCOUNTER — Other Ambulatory Visit: Payer: Self-pay | Admitting: Family Medicine

## 2021-04-10 DIAGNOSIS — M25551 Pain in right hip: Secondary | ICD-10-CM | POA: Diagnosis not present

## 2021-04-10 LAB — VITAMIN C: Vitamin C: 0.9 mg/dL (ref 0.4–2.0)

## 2021-04-10 LAB — COPPER, SERUM: Copper: 156 ug/dL (ref 80–158)

## 2021-04-11 LAB — COAG STUDIES INTERP REPORT

## 2021-04-11 LAB — VON WILLEBRAND PANEL
Coagulation Factor VIII: 165 % — ABNORMAL HIGH (ref 56–140)
Ristocetin Co-factor, Plasma: 121 % (ref 50–200)
Von Willebrand Antigen, Plasma: 172 % (ref 50–200)

## 2021-04-13 ENCOUNTER — Encounter: Payer: Medicare Other | Attending: Surgery | Admitting: Skilled Nursing Facility1

## 2021-04-13 DIAGNOSIS — E1165 Type 2 diabetes mellitus with hyperglycemia: Secondary | ICD-10-CM | POA: Insufficient documentation

## 2021-04-14 ENCOUNTER — Ambulatory Visit (INDEPENDENT_AMBULATORY_CARE_PROVIDER_SITE_OTHER): Payer: Medicare Other | Admitting: Psychology

## 2021-04-14 DIAGNOSIS — F509 Eating disorder, unspecified: Secondary | ICD-10-CM

## 2021-04-15 ENCOUNTER — Inpatient Hospital Stay (HOSPITAL_BASED_OUTPATIENT_CLINIC_OR_DEPARTMENT_OTHER): Payer: Medicare Other | Admitting: Hematology

## 2021-04-15 ENCOUNTER — Encounter: Payer: Medicare Other | Admitting: Skilled Nursing Facility1

## 2021-04-15 ENCOUNTER — Other Ambulatory Visit: Payer: Self-pay

## 2021-04-15 DIAGNOSIS — Z87442 Personal history of urinary calculi: Secondary | ICD-10-CM | POA: Diagnosis not present

## 2021-04-15 DIAGNOSIS — E039 Hypothyroidism, unspecified: Secondary | ICD-10-CM | POA: Diagnosis not present

## 2021-04-15 DIAGNOSIS — E785 Hyperlipidemia, unspecified: Secondary | ICD-10-CM | POA: Diagnosis not present

## 2021-04-15 DIAGNOSIS — I129 Hypertensive chronic kidney disease with stage 1 through stage 4 chronic kidney disease, or unspecified chronic kidney disease: Secondary | ICD-10-CM | POA: Diagnosis not present

## 2021-04-15 DIAGNOSIS — K219 Gastro-esophageal reflux disease without esophagitis: Secondary | ICD-10-CM | POA: Diagnosis not present

## 2021-04-15 DIAGNOSIS — F419 Anxiety disorder, unspecified: Secondary | ICD-10-CM | POA: Diagnosis not present

## 2021-04-15 DIAGNOSIS — R233 Spontaneous ecchymoses: Secondary | ICD-10-CM | POA: Diagnosis not present

## 2021-04-15 DIAGNOSIS — Z6841 Body Mass Index (BMI) 40.0 and over, adult: Secondary | ICD-10-CM | POA: Diagnosis not present

## 2021-04-15 DIAGNOSIS — Z8616 Personal history of COVID-19: Secondary | ICD-10-CM | POA: Diagnosis not present

## 2021-04-15 DIAGNOSIS — Z8541 Personal history of malignant neoplasm of cervix uteri: Secondary | ICD-10-CM | POA: Diagnosis not present

## 2021-04-15 DIAGNOSIS — E1165 Type 2 diabetes mellitus with hyperglycemia: Secondary | ICD-10-CM | POA: Diagnosis not present

## 2021-04-15 DIAGNOSIS — Z8379 Family history of other diseases of the digestive system: Secondary | ICD-10-CM | POA: Diagnosis not present

## 2021-04-15 DIAGNOSIS — F32A Depression, unspecified: Secondary | ICD-10-CM | POA: Diagnosis not present

## 2021-04-15 DIAGNOSIS — D691 Qualitative platelet defects: Secondary | ICD-10-CM | POA: Diagnosis not present

## 2021-04-15 DIAGNOSIS — K589 Irritable bowel syndrome without diarrhea: Secondary | ICD-10-CM | POA: Diagnosis not present

## 2021-04-15 DIAGNOSIS — Z881 Allergy status to other antibiotic agents status: Secondary | ICD-10-CM | POA: Diagnosis not present

## 2021-04-15 DIAGNOSIS — Z90721 Acquired absence of ovaries, unilateral: Secondary | ICD-10-CM | POA: Diagnosis not present

## 2021-04-15 NOTE — Progress Notes (Addendum)
Marland Kitchen   HEMATOLOGY/ONCOLOGY CONSULTATION NOTE  Date of Service: .04/08/2021   Patient Care Team: Kristian Covey, MD as PCP - General (Family Medicine)  CHIEF COMPLAINTS/PURPOSE OF CONSULTATION:  Easy bruisability  HISTORY OF PRESENTING ILLNESS:   [name] Sherman is a wonderful 60 y.o. female who has been referred to [place] by Dr Ranee Gosselin, MD for evaluation and management of easy bruising.  Patient has a history of multiple medical issues including hypertension, dyslipidemia, hypothyroidism, irritable bowel syndrome, sleep apnea not currently using CPAP, migraine headaches, GERD, history of COVID-19 in February 2021, significant degenerative arthritis , morbid obesity (.Body mass index is 45.64 kg/m.), anxiety and depression.  She notes increased bruising over her upper and lower extremities with minimal trauma and sometimes with no overt trauma that she can recall.  Patient has no family history of bleeding disorder.  She personally has not had any history of excessive bleeding with her multiple surgeries in the past.  No previous history of GI bleeding, hematuria, hemarthrosis, muscle hematomas or CNS bleeding.  No history of epistaxis.  Notes that in the past her periods might have been a little heavier.  Patient notes that she has gained a fair amount of weight in the last few years and does not know why.  She believes that she might have Cushing's syndrome but notes that her primary doctor did not think so based on her labs.  She has been getting significant number of steroid injections and at times oral steroids which could thin her skin and blood vessels and increase the chances of senile purpura earlier in life.  She has been on chronic SSRI therapy which can also cause platelet dysfunction.  Notes intermittent use of NSAIDs which can also in addition to her SSRI because platelet dysfunction.  Patient notes that she has been compliant with her thyroid replacement and thyroid  function tests were within normal limits in June 2022 per review. Patient notes that she is not on any other blood thinners that she knows of. She does feel that some of these bruising symptoms are worse after she got quite sick with COVID-19 in February 2021 and might have lost weight and have suffered some nutritional deficits at the time.  Denies any known liver disease. No other over-the-counter supplements such as garlic or high-dose fish oil or ginkgo.   MEDICAL HISTORY:  Past Medical History:  Diagnosis Date   ADD (attention deficit disorder)    Anemia    Anxiety    Arthritis    shoulder, right (arthritis, tendonitis, bursitis)   Cancer (HCC)    cervical cancer (conization)    Chronic kidney disease    Complication of anesthesia    COVID-19 07/2019   Depression    Dyspnea 2020   GERD (gastroesophageal reflux disease)    History of cervical cancer    s/p  laser ablation of cervix 1980's   History of kidney stones    Hyperlipidemia    Hypertension    Hypothyroidism    IBS (irritable bowel syndrome)    Irregular heart rate    as a child, has never had any problems    Kidney stones    Left ureteral stone    Migraine    OSA (obstructive sleep apnea)    pt used cpap up until 2013 states lost wt and did not need anymore   PONV (postoperative nausea and vomiting)    Pre-diabetes    Restless legs    Sleep apnea  wears CPAP   Umbilical hernia without obstruction or gangrene    Urgency of urination    Vertigo    Vitamin D deficiency     SURGICAL HISTORY: Past Surgical History:  Procedure Laterality Date   ABDOMINAL HYSTERECTOMY     BACK SURGERY  04/27/2018   and in 2021 (F[place])   BUNIONECTOMY Right 2004   COLONOSCOPY     CYSTOSCOPY W/ RETROGRADES Bilateral 05/26/2015   Procedure: CYSTOSCOPY WITH RETROGRADE PYELOGRAM;  Surgeon: Festus Aloe, MD;  Location: [facility];  Service: Urology;  Laterality: Bilateral;   CYSTOSCOPY W/ URETERAL STENT  REMOVAL Left 05/26/2015   Procedure: CYSTOSCOPY WITH STENT REMOVAL;  Surgeon: Festus Aloe, MD;  Location: [facility];  Service: Urology;  Laterality: Left;   CYSTOSCOPY WITH RETROGRADE PYELOGRAM, URETEROSCOPY AND STENT PLACEMENT Left 05/01/2015   Procedure: CYSTOSCOPY WITH RETROGRADE PYELOGRAM AND STENT PLACEMENT;  Surgeon: Festus Aloe, MD;  Location: [facility];  Service: Urology;  Laterality: Left;   CYSTOSCOPY WITH STENT PLACEMENT Bilateral 05/26/2015   Procedure: CYSTOSCOPY WITH STENT PLACEMENT;  Surgeon: Festus Aloe, MD;  Location[facility];  Service: Urology;  Laterality: Bilateral;   CYSTOSCOPY WITH URETEROSCOPY Bilateral 05/26/2015   Procedure: CYSTOSCOPY WITH URETEROSCOPY;  Surgeon: Festus Aloe, MD;  Location: [facility];  Service: Urology;  Laterality: Bilateral;   CYSTOSCOPY/URETEROSCOPY/HOLMIUM LASER/STENT PLACEMENT Right 06/09/2015   Procedure: CYSTOSCOPY/URETEROSCOPY/STENT PLACEMENT REMOVAL LEFT URETERAL STENT;  Surgeon: Festus Aloe, MD;  Location: [facility];  Service: Urology;  Laterality: Right;   DIAGNOSTIC LAPAROSCOPY     HERNIA REPAIR     HOLMIUM LASER APPLICATION Left 28/41/3244   Procedure: HOLMIUM LASER APPLICATION;  Surgeon: Festus Aloe, MD;  Location: [facility];  Service: Urology;  Laterality: Left;   HOLMIUM LASER APPLICATION Right 06/29/7251   Procedure: HOLMIUM LASER APPLICATION;  Surgeon: Festus Aloe, MD;  Location: [facility];  Service: Urology;  Laterality: Right;   INSERTION OF MESH N/A 09/05/2016   Procedure: INSERTION OF MESH;  Surgeon: [name], MD;  Location: [place];  Service: General;  Laterality: N/A;   LAPAROSCOPIC ASSISTED VAGINAL HYSTERECTOMY  04-23-2002   and Left Salpingoophorectomy/  Anterior Repair/  Tension Free Tape Sling placement   LASER ABLATION OF THE CERVIX  x2  1980's   TUBAL LIGATION  1980's   VENTRAL HERNIA REPAIR N/A 09/05/2016   Procedure:  LAPAROSCOPIC VENTRAL HERNIA;  Surgeon: [name], MD;  Location: [place];  Service: General;  Laterality: N/A;    SOCIAL HISTORY: Social History   Socioeconomic History   Marital status: Legally Separated    Spouse name: Not on file   Number of children: Not on file   Years of education: Not on file   Highest education level: Not on file  Occupational History   Not on file  Tobacco Use   Smoking status: Never   Smokeless tobacco: Never  Vaping Use   Vaping Use: Never used  Substance and Sexual Activity   Alcohol use: Never    Comment: 1 x year   Drug use: Never   Sexual activity: Yes  Other Topics Concern   Not on file  Social History Narrative   Not on file   Social Determinants of Health   Financial Resource Strain: Not on file  Food Insecurity: Not on file  Transportation Needs: Not on file  Physical Activity: Not on file  Stress: Not on file  Social Connections: Not on file  Intimate Partner  Violence: Not on file    FAMILY HISTORY: Family History  Problem Relation Age of Onset   Alcohol abuse Mother    Lung cancer Mother    Colon cancer Mother    Arthritis Father    Hyperlipidemia Father    Heart disease Father    Hypertension Father    COPD Father    Kidney disease Paternal Uncle    Cancer Maternal Grandmother        colon   Colon cancer Maternal Grandmother    Rectal cancer Maternal Grandmother    Cancer Paternal Grandmother        breast   Kidney cancer Sister        lung cancer   Cirrhosis Sister    Esophageal cancer Neg Hx    Stomach cancer Neg Hx     ALLERGIES:  is allergic to keflex [cephalexin].  MEDICATIONS:  Current Outpatient Medications  Medication Sig Dispense Refill   Accu-Chek Softclix Lancets lancets Use as instructed 100 each 12   amphetamine-dextroamphetamine (ADDERALL) 30 MG tablet Take one tablet by mouth twice daily.  May refill in one month. 60 tablet 0   atorvastatin (LIPITOR) 10 MG tablet TAKE 1 TABLET BY MOUTH  DAILY 90 tablet 2   Biotin 1000 MCG tablet Take 1,000 mcg by mouth 3 (three) times daily.     blood glucose meter kit and supplies Dispense based on patient and insurance preference. Use up to four times daily as directed. (FOR ICD-10 E10.9, E11.9). 1 each 0   citalopram (CELEXA) 20 MG tablet Take 1 tablet (20 mg total) by mouth daily. 90 tablet 3   cyclobenzaprine (FLEXERIL) 10 MG tablet Take 10 mg by mouth 2 (two) times daily.     furosemide (LASIX) 20 MG tablet TAKE 1 TABLET BY MOUTH ONCE A DAY AS NEEDED FOR SWELLING 30 tablet 1   glucose blood test strip Use as instructed 100 each 12   nortriptyline (PAMELOR) 25 MG capsule Take by mouth.     nystatin cream (MYCOSTATIN) Apply 1 application topically 2 (two) times daily. 30 g 1   ondansetron (ZOFRAN) 8 MG tablet Take 1 tablet (8 mg total) by mouth every 8 (eight) hours as needed for nausea or vomiting. 60 tablet 1   oxyCODONE-acetaminophen (PERCOCET/ROXICET) 5-325 MG tablet Take 1-2 tablets by mouth every 4 (four) hours as needed for moderate pain or severe pain. 60 tablet 0   pantoprazole (PROTONIX) 40 MG tablet TAKE 1 TABLET(40 MG) BY MOUTH TWICE DAILY 180 tablet 1   Probiotic Product (ALIGN PO) Take 1 capsule by mouth daily.     rosuvastatin (CRESTOR) 10 MG tablet Take 1 tablet (10 mg total) by mouth daily. 90 tablet 1   Semaglutide, 1 MG/DOSE, (OZEMPIC, 1 MG/DOSE,) 2 MG/1.5ML SOPN Inject 1 mg into the skin once a week. 3 mL 5   SUMAtriptan (IMITREX) 100 MG tablet May repeat in 2 hours if headache persists or recurs.  Do not take more than 2 in 24 hours (Patient taking differently: Take 100 mg by mouth every 2 (two) hours as needed for migraine. May repeat in 2 hours if headache persists or recurs.  Do not take more than 2 in 24 hours) 10 tablet 11   Turmeric-Ginger 135-6 MG CHEW Chew 2 each by mouth daily.     Vitamin D, Ergocalciferol, (DRISDOL) 1.25 MG (50000 UNIT) CAPS capsule TAKE 1 CAPSULE BY MOUTH 1 TIME A WEEK (Patient taking  differently: Take 50,000 Units by mouth every  Monday. TAKE 1 CAPSULE BY MOUTH 1 TIME A WEEK) 12 capsule 3   amphetamine-dextroamphetamine (ADDERALL) 30 MG tablet Take one tablet twice daily. (Patient not taking: Reported on 04/08/2021) 60 tablet 0   amphetamine-dextroamphetamine (ADDERALL) 30 MG tablet Take 1 tablet by mouth twice daily.  May refill in 2 months. (Patient not taking: Reported on 04/08/2021) 60 tablet 0   losartan-hydrochlorothiazide (HYZAAR) 100-12.5 MG tablet TAKE 1 TABLET BY MOUTH EVERY DAY 90 tablet 1   pramipexole (MIRAPEX) 0.125 MG tablet TAKE 1-2 TABLET EVERY EVENING AS NEEDED FOR RESTLESS LEG SYNDROME (Patient not taking: Reported on 04/08/2021) 60 tablet 3   SYNTHROID 50 MCG tablet TAKE 1 TABLET BY MOUTH EVERY DAY 90 tablet 0   No current facility-administered medications for this visit.    REVIEW OF SYSTEMS:    .10 Point review of Systems was done is negative except as noted above.   PHYSICAL EXAMINATION: ECOG PERFORMANCE STATUS: 2 - Symptomatic, <50% confined to bed  . Vitals:   04/08/21 1128  BP: (!) 159/86  Pulse: (!) 111  Resp: 19  Temp: 99 F (37.2 C)  SpO2: 98%   Filed Weights   04/08/21 1128  Weight: 265 lb 14.4 oz (120.6 kg)   .Body mass index is 45.64 kg/m.  GENERAL:alert, in no acute distress and comfortable SKIN: no acute rashes, patient with superficial purpura over her bilateral upper extremities in various stages of healing. EYES: conjunctiva are pink and non-injected, sclera anicteric OROPHARYNX: MMM, no exudates, no oropharyngeal erythema or ulceration NECK: supple, no JVD LYMPH:  no palpable lymphadenopathy in the cervical, axillary or inguinal regions LUNGS: clear to auscultation b/l with normal respiratory effort HEART: regular rate & rhythm ABDOMEN:  normoactive bowel sounds , non tender, not distended. Extremity: no pedal edema PSYCH: alert & oriented x 3 with fluent speech NEURO: no focal motor/sensory deficits  LABORATORY  DATA:  I have reviewed the data as listed  . CBC Latest Ref Rng & Units 04/08/2021 10/02/2020 03/23/2020  WBC 4.0 - 10.5 K/uL 20.5(H) 13.1(H) 9.6  Hemoglobin 12.0 - 15.0 g/dL 12.4 11.7(L) 10.7(L)  Hematocrit 36.0 - 46.0 % 38.8 36.5 35.8  Platelets 150 - 400 K/uL 461(H) 460.0(H) 477(H)    . CMP Latest Ref Rng & Units 04/08/2021 12/07/2020 03/23/2020  Glucose 70 - 99 mg/dL 152(H) 144(H) 131(H)  BUN 6 - 20 mg/dL $Remove'19 14 12  'rdUUqYF$ Creatinine 0.44 - 1.00 mg/dL 0.90 0.86 0.99  Sodium 135 - 145 mmol/L 135 140 139  Potassium 3.5 - 5.1 mmol/L 3.9 3.9 3.4(L)  Chloride 98 - 111 mmol/L 102 104 102  CO2 22 - 32 mmol/L $RemoveB'26 26 29  'RPlabyvp$ Calcium 8.9 - 10.3 mg/dL 9.5 8.8 9.5  Total Protein 6.5 - 8.1 g/dL 8.2(H) - 6.8  Total Bilirubin 0.3 - 1.2 mg/dL 0.5 - 0.4  Alkaline Phos 38 - 126 U/L 141(H) - -  AST 15 - 41 U/L 24 - 17  ALT 0 - 44 U/L 31 - 21     RADIOGRAPHIC STUDIES: I have personally reviewed the radiological images as listed and agreed with the findings in the report. No results found.  ASSESSMENT & PLAN:   60 year old very pleasant female with significant medical comorbidities as noted above with  1) Easy bruisability Patient with low annualized bleeding risk score based on detailed inventory of bleeding symptoms. PLAN -We discussed the different reasons for easy bruisability including but not limited to obesity with decreased support to the cutaneous blood vessels, chronic recurrent steroid  use, use of medications that affect platelet function such as SSRIs and NSAIDs. -Commended avoiding using any over-the-counter supplements that can increase bleeding risk such as ginkgo, high doses of fish oil etc. -We discussed that based on her symptoms and lack of family history of bleeding issues and lack of personal history with abnormal bleeding with multiple previous surgeries the chance of this being an inherited bleeding diathesis is relatively low. -Often a lot of the risk factors are related to skin  health.  We discussed that keeping her skin well moisturized and supple would be useful to reduce skin injury. -She could also try to use vitamin E  oil topically on her skin. -Discussed optimizing thyroid replacement with primary care physician and also to work on weight loss. -Recommended using compression sleeves for her bilateral upper extremities when doing something active like gardening or warts or anything that involves more physical activity with her upper extremities. -We will send out screening work-up to rule out the possibility for bleeding diathesis. -Patient had multiple questions which were answered in detail.    Follow-up Labs today Phone visit with Dr Candise Che in 1 week  Orders Placed This Encounter  Procedures   CBC with Differential/Platelet    Standing Status:   Future    Number of Occurrences:   1    Standing Expiration Date:   04/08/2022   CMP (Cancer Center only)    Standing Status:   Future    Number of Occurrences:   1    Standing Expiration Date:   04/08/2022   Platelet function assay    Standing Status:   Future    Number of Occurrences:   1    Standing Expiration Date:   04/08/2022   Von Willebrand panel    Standing Status:   Future    Number of Occurrences:   1    Standing Expiration Date:   04/08/2022   Von Willebrand Factor Multimer    Standing Status:   Future    Number of Occurrences:   1    Standing Expiration Date:   04/08/2022   Protime-INR    Standing Status:   Future    Number of Occurrences:   1    Standing Expiration Date:   04/08/2022   APTT    Standing Status:   Future    Number of Occurrences:   1    Standing Expiration Date:   04/08/2022   Vitamin C    Standing Status:   Future    Number of Occurrences:   1    Standing Expiration Date:   04/08/2022   Ferritin    Standing Status:   Future    Number of Occurrences:   1    Standing Expiration Date:   04/08/2022   Copper, serum    Standing Status:   Future    Number of  Occurrences:   1    Standing Expiration Date:   04/08/2022   Fibrinogen    Standing Status:   Future    Number of Occurrences:   1    Standing Expiration Date:   04/08/2022      All of the patients questions were answered with apparent satisfaction. The patient knows to call the clinic with any problems, questions or concerns.  I spent 40 minutes counseling the patient face to face. The total time spent in the appointment was 60 minutes and more than 50% was on counseling and direct patient cares.  Sullivan Lone MD [place] AAHIVMS [facility] Hematology/Oncology Physician [facility]  (Office):       [phone] (Work cell):  [phone] (Fax):           [phone]  04/15/2021 10:08 AM

## 2021-04-15 NOTE — Progress Notes (Signed)
Supervised Weight Loss Visit Bariatric Nutrition Education  Planned Surgery: sleeve  6 out of 6 SWL Appointments   NUTRITION ASSESSMENT   Anthropometrics  Start weight at [facility]: 274.1 lbs (date: 10/05/2020)  Weight: 268 lbs Height: 64 in BMI: 46.00 kg/m2     Clinical  Medical hx: GERD, kidney stones, diabetes, restless legs, IBS Medications: vitamin D, ozempic, biotin, b12 gummies, collagen, tumeric, allign  Labs: HbA1c of 6.9 on 10/02/20, Cortisol .3, T4 .94, TSH 2.45 Notable signs/symptoms: nausea, migraines Any previous deficiencies? Vitamin D  Lifestyle & Dietary Hx   Pt states in the last 6 months she has learned the importance of making healthier choices with food and drink.  Pt states after surgery she feels she has to intentionally focus on not skipping meals throughout the day.   Pt states it has been a hard journey and she still has far to go but feels really proud of herself for the changes she has made. Pt states her cravings have even decreased. [facility]   Estimated daily fluid intake: unknown oz Supplements:  Current average weekly physical activity: trying to move a little more   24-Hr Dietary Recall First Meal: cheerios + banana Snack:  Second Meal: chicken sandwich Snack: pretzels or carrots Third Meal: spagetti Snack:  Beverages: coffee + sugar free cream, water, gone to half and half sweet tea  Estimated Energy Needs Calories: 1500   NUTRITION DIAGNOSIS  Overweight/obesity ([place]-3.3) related to past poor dietary habits and physical inactivity as evidenced by patient w/ planned sleeve gastrectomy surgery following dietary guidelines for continued weight loss.  NUTRITION INTERVENTION  Nutrition counseling (C-1) and education (E-2) to facilitate bariatric surgery goals. Educated pt on lifestyle changes correcting blood sugars.   Pre-Op Goals Progress & New Goals Continue: Practice CHEWING your food (aim  for applesauce consistency) Continue: Practice not drinking 15 minutes before, during, and 30 minutes after each meal and snack Continue: Aim for 64-100 ounces of FLUID daily (with at least half of fluid intake being plain water)  continue: avoid alcohol continue cut back on sugar in coffee; choose sugar free creamer instead  continue: drink one 32 ounces bottle by 12 and then the next 32 ounce by 4pm continue: try smoothies or greek yogurt (add metamucil to your smoothie) when no appetite continue: limit fruit to 3 servings per day continue: work on consistently eating throughout the day  continue: think of eating as taking medicine you do not have an internal cue for that either continue: try an alarm on your watch to remind you to eat continue: increase your movement throughout the day continue: limit eating out to once a week NEW: ensure you are having non starchy vegetables with lunch and dinner 7 days a week NEW: start water aerobics   Handouts Provided Include    Learning Style & Readiness for Change Teaching method utilized: Visual & Auditory  Demonstrated degree of understanding via: Teach Back  Readiness Level: contemplative/action Barriers to learning/adherence to lifestyle change: unknown at this time  RD's Notes for next Visit  Assess pts adherence to chosen goals   MONITORING & EVALUATION Dietary intake, weekly physical activity, body weight, and pre-op goals  Next Steps  Patient is to return to [facility] for pre-op class

## 2021-04-16 LAB — VON WILLEBRAND FACTOR MULTIMER

## 2021-04-23 NOTE — Progress Notes (Signed)
[name] Sherman   HEMATOLOGY/ONCOLOGY PHONE VISIT NOTE  Date of Service: .04/15/2021   Patient Care Team: [name], MD as PCP - General (Family Medicine)  CHIEF COMPLAINTS/PURPOSE OF CONSULTATION:  Easy bruisability  HISTORY OF PRESENTING ILLNESS:   [name] Sherman is a wonderful 59 y.o. female who has been referred to [place] by Dr Latanya Maudlin, MD for evaluation and management of easy bruising.  Patient has a history of multiple medical issues including hypertension, dyslipidemia, hypothyroidism, irritable bowel syndrome, sleep apnea not currently using CPAP, migraine headaches, GERD, history of COVID-19 in February 2021, significant degenerative arthritis , morbid obesity (.There is no height or weight on file to calculate BMI.), anxiety and depression.  She notes increased bruising over her upper and lower extremities with minimal trauma and sometimes with no overt trauma that she can recall.  Patient has no family history of bleeding disorder.  She personally has not had any history of excessive bleeding with her multiple surgeries in the past.  No previous history of GI bleeding, hematuria, hemarthrosis, muscle hematomas or CNS bleeding.  No history of epistaxis.  Notes that in the past her periods might have been a little heavier.  Patient notes that she has gained a fair amount of weight in the last few years and does not know why.  She believes that she might have Cushing's syndrome but notes that her primary doctor did not think so based on her labs.  She has been getting significant number of steroid injections and at times oral steroids which could thin her skin and blood vessels and increase the chances of senile purpura earlier in life.  She has been on chronic SSRI therapy which can also cause platelet dysfunction.  Notes intermittent use of NSAIDs which can also in addition to her SSRI because platelet dysfunction.  Patient notes that she has been compliant with her thyroid  replacement and thyroid function tests were within normal limits in June 2022 per review. Patient notes that she is not on any other blood thinners that she knows of. She does feel that some of these bruising symptoms are worse after she got quite sick with COVID-19 in February 2021 and might have lost weight and have suffered some nutritional deficits at the time.  Denies any known liver disease. No other over-the-counter supplements such as garlic or high-dose fish oil or ginkgo.  INTERVAL HISTORY  .I connected with [name] Sherman on .04/15/2021  at 10:20 AM EDT by telephone visit and verified that I am speaking with the correct person using two identifiers.   I discussed the limitations, risks, security and privacy concerns of performing an evaluation and management service by telemedicine and the availability of in-person appointments. I also discussed with the patient that there may be a patient responsible charge related to this service. The patient expressed understanding and agreed to proceed.   Patient's location: Home Provider's location: [place]  Patient was called to discuss her lab results for work-up done to rule out any bleeding disorder. Patient notes no acute new symptoms since her last clinic visit. We discussed all her lab results in detail at and that the lab results do not suggest any overt evidence of a primary bleeding diathesis.   MEDICAL HISTORY:  Past Medical History:  Diagnosis Date   ADD (attention deficit disorder)    Anemia    Anxiety    Arthritis    shoulder, right (arthritis, tendonitis, bursitis)   Cancer ([place])  cervical cancer (conization)    Chronic kidney disease    Complication of anesthesia    COVID-19 07/2019   Depression    Dyspnea 2020   GERD (gastroesophageal reflux disease)    History of cervical cancer    s/p  laser ablation of cervix 1980's   History of kidney stones    Hyperlipidemia    Hypertension     Hypothyroidism    IBS (irritable bowel syndrome)    Irregular heart rate    as a child, has never had any problems    Kidney stones    Left ureteral stone    Migraine    OSA (obstructive sleep apnea)    pt used cpap up until 2013 states lost wt and did not need anymore   PONV (postoperative nausea and vomiting)    Pre-diabetes    Restless legs    Sleep apnea    wears CPAP   Umbilical hernia without obstruction or gangrene    Urgency of urination    Vertigo    Vitamin D deficiency     SURGICAL HISTORY: Past Surgical History:  Procedure Laterality Date   ABDOMINAL HYSTERECTOMY     BACK SURGERY  04/27/2018   and in 2021 ([place])   BUNIONECTOMY Right 2004   COLONOSCOPY     CYSTOSCOPY W/ RETROGRADES Bilateral 05/26/2015   Procedure: CYSTOSCOPY WITH RETROGRADE PYELOGRAM;  Surgeon: Festus Aloe, MD;  Location: [facility];  Service: Urology;  Laterality: Bilateral;   CYSTOSCOPY W/ URETERAL STENT REMOVAL Left 05/26/2015   Procedure: CYSTOSCOPY WITH STENT REMOVAL;  Surgeon: Festus Aloe, MD;  Location: [facility];  Service: Urology;  Laterality: Left;   CYSTOSCOPY WITH RETROGRADE PYELOGRAM, URETEROSCOPY AND STENT PLACEMENT Left 05/01/2015   Procedure: CYSTOSCOPY WITH RETROGRADE PYELOGRAM AND STENT PLACEMENT;  Surgeon: Festus Aloe, MD;  Location: [facility];  Service: Urology;  Laterality: Left;   CYSTOSCOPY WITH STENT PLACEMENT Bilateral 05/26/2015   Procedure: CYSTOSCOPY WITH STENT PLACEMENT;  Surgeon: Festus Aloe, MD;  Location: [facility];  Service: Urology;  Laterality: Bilateral;   CYSTOSCOPY WITH URETEROSCOPY Bilateral 05/26/2015   Procedure: CYSTOSCOPY WITH URETEROSCOPY;  Surgeon: Festus Aloe, MD;  Location: [facility];  Service: Urology;  Laterality: Bilateral;   CYSTOSCOPY/URETEROSCOPY/HOLMIUM LASER/STENT PLACEMENT Right 06/09/2015   Procedure: CYSTOSCOPY/URETEROSCOPY/STENT PLACEMENT REMOVAL LEFT  URETERAL STENT;  Surgeon: Festus Aloe, MD;  Location: [facility];  Service: Urology;  Laterality: Right;   DIAGNOSTIC LAPAROSCOPY     HERNIA REPAIR     HOLMIUM LASER APPLICATION Left 75/64/3329   Procedure: HOLMIUM LASER APPLICATION;  Surgeon: Festus Aloe, MD;  Location: [facility];  Service: Urology;  Laterality: Left;   HOLMIUM LASER APPLICATION Right 51/88/4166   Procedure: HOLMIUM LASER APPLICATION;  Surgeon: Festus Aloe, MD;  Location: [facility];  Service: Urology;  Laterality: Right;   INSERTION OF MESH N/A 09/05/2016   Procedure: INSERTION OF MESH;  Surgeon: [name], MD;  Location: [place];  Service: General;  Laterality: N/A;   LAPAROSCOPIC ASSISTED VAGINAL HYSTERECTOMY  04-23-2002   and Left Salpingoophorectomy/  Anterior Repair/  Tension Free Tape Sling placement   LASER ABLATION OF THE CERVIX  x2  1980's   TUBAL LIGATION  1980's   VENTRAL HERNIA REPAIR N/A 09/05/2016   Procedure: LAPAROSCOPIC VENTRAL HERNIA;  Surgeon: [name], MD;  Location: [place];  Service: General;  Laterality: N/A;    SOCIAL HISTORY: Social History   Socioeconomic History   Marital  status: Legally Separated    Spouse name: Not on file   Number of children: Not on file   Years of education: Not on file   Highest education level: Not on file  Occupational History   Not on file  Tobacco Use   Smoking status: Never   Smokeless tobacco: Never  Vaping Use   Vaping Use: Never used  Substance and Sexual Activity   Alcohol use: Never    Comment: 1 x year   Drug use: Never   Sexual activity: Yes  Other Topics Concern   Not on file  Social History Narrative   Not on file   Social Determinants of Health   Financial Resource Strain: Not on file  Food Insecurity: Not on file  Transportation Needs: Not on file  Physical Activity: Not on file  Stress: Not on file  Social Connections: Not on file  Intimate Partner Violence: Not on file    FAMILY HISTORY: Family  History  Problem Relation Age of Onset   Alcohol abuse Mother    Lung cancer Mother    Colon cancer Mother    Arthritis Father    Hyperlipidemia Father    Heart disease Father    Hypertension Father    COPD Father    Kidney disease Paternal Uncle    Cancer Maternal Grandmother        colon   Colon cancer Maternal Grandmother    Rectal cancer Maternal Grandmother    Cancer Paternal Grandmother        breast   Kidney cancer Sister        lung cancer   Cirrhosis Sister    Esophageal cancer Neg Hx    Stomach cancer Neg Hx     ALLERGIES:  is allergic to keflex [cephalexin].  MEDICATIONS:  Current Outpatient Medications  Medication Sig Dispense Refill   Accu-Chek Softclix Lancets lancets Use as instructed 100 each 12   amphetamine-dextroamphetamine (ADDERALL) 30 MG tablet Take one tablet by mouth twice daily.  May refill in one month. 60 tablet 0   amphetamine-dextroamphetamine (ADDERALL) 30 MG tablet Take one tablet twice daily. (Patient not taking: Reported on 04/08/2021) 60 tablet 0   amphetamine-dextroamphetamine (ADDERALL) 30 MG tablet Take 1 tablet by mouth twice daily.  May refill in 2 months. (Patient not taking: Reported on 04/08/2021) 60 tablet 0   atorvastatin (LIPITOR) 10 MG tablet TAKE 1 TABLET BY MOUTH DAILY 90 tablet 2   Biotin 1000 MCG tablet Take 1,000 mcg by mouth 3 (three) times daily.     blood glucose meter kit and supplies Dispense based on patient and insurance preference. Use up to four times daily as directed. (FOR ICD-10 E10.9, E11.9). 1 each 0   citalopram (CELEXA) 20 MG tablet Take 1 tablet (20 mg total) by mouth daily. 90 tablet 3   cyclobenzaprine (FLEXERIL) 10 MG tablet Take 10 mg by mouth 2 (two) times daily.     furosemide (LASIX) 20 MG tablet TAKE 1 TABLET BY MOUTH ONCE A DAY AS NEEDED FOR SWELLING 30 tablet 1   glucose blood test strip Use as instructed 100 each 12   losartan-hydrochlorothiazide (HYZAAR) 100-12.5 MG tablet TAKE 1 TABLET BY MOUTH  EVERY DAY 90 tablet 1   nortriptyline (PAMELOR) 25 MG capsule Take by mouth.     nystatin cream (MYCOSTATIN) Apply 1 application topically 2 (two) times daily. 30 g 1   ondansetron (ZOFRAN) 8 MG tablet Take 1 tablet (8 mg total) by mouth every  8 (eight) hours as needed for nausea or vomiting. 60 tablet 1   oxyCODONE-acetaminophen (PERCOCET/ROXICET) 5-325 MG tablet Take 1-2 tablets by mouth every 4 (four) hours as needed for moderate pain or severe pain. 60 tablet 0   pantoprazole (PROTONIX) 40 MG tablet TAKE 1 TABLET(40 MG) BY MOUTH TWICE DAILY 180 tablet 1   pramipexole (MIRAPEX) 0.125 MG tablet TAKE 1-2 TABLET EVERY EVENING AS NEEDED FOR RESTLESS LEG SYNDROME (Patient not taking: Reported on 04/08/2021) 60 tablet 3   Probiotic Product (ALIGN PO) Take 1 capsule by mouth daily.     rosuvastatin (CRESTOR) 10 MG tablet Take 1 tablet (10 mg total) by mouth daily. 90 tablet 1   Semaglutide, 1 MG/DOSE, (OZEMPIC, 1 MG/DOSE,) 2 MG/1.5ML SOPN Inject 1 mg into the skin once a week. 3 mL 5   SUMAtriptan (IMITREX) 100 MG tablet May repeat in 2 hours if headache persists or recurs.  Do not take more than 2 in 24 hours (Patient taking differently: Take 100 mg by mouth every 2 (two) hours as needed for migraine. May repeat in 2 hours if headache persists or recurs.  Do not take more than 2 in 24 hours) 10 tablet 11   SYNTHROID 50 MCG tablet TAKE 1 TABLET BY MOUTH EVERY DAY 90 tablet 0   Turmeric-Ginger 135-6 MG CHEW Chew 2 each by mouth daily.     Vitamin D, Ergocalciferol, (DRISDOL) 1.25 MG (50000 UNIT) CAPS capsule TAKE 1 CAPSULE BY MOUTH 1 TIME A WEEK (Patient taking differently: Take 50,000 Units by mouth every Monday. TAKE 1 CAPSULE BY MOUTH 1 TIME A WEEK) 12 capsule 3   No current facility-administered medications for this visit.    REVIEW OF SYSTEMS:    .[name] Kitchen10 Point review of Systems was done is negative except as noted above.    PHYSICAL EXAMINATION: Telemedicine visit   LABORATORY DATA:  I have  reviewed the data as listed  . CBC Latest Ref Rng & Units 04/08/2021 10/02/2020 03/23/2020  WBC 4.0 - 10.5 K/uL 20.5(H) 13.1(H) 9.6  Hemoglobin 12.0 - 15.0 g/dL 12.4 11.7(L) 10.7(L)  Hematocrit 36.0 - 46.0 % 38.8 36.5 35.8  Platelets 150 - 400 K/uL 461(H) 460.0(H) 477(H)    . CMP Latest Ref Rng & Units 04/08/2021 12/07/2020 03/23/2020  Glucose 70 - 99 mg/dL 152(H) 144(H) 131(H)  BUN 6 - 20 mg/dL _0 Creatinine 0.44 - 1.00 mg/dL 0.90 0.86 0.99  Sodium 135 - 145 mmol/L 135 140 139  Potassium 3.5 - 5.1 mmol/L 3.9 3.9 3.4(L)  Chloride 98 - 111 mmol/L 102 104 102  CO2 22 - 32 mmol/L _1 Calcium 8.9 - 10.3 mg/dL 9.5 8.8 9.5  Total Protein 6.5 - 8.1 g/dL 8.2(H) - 6.8  Total Bilirubin 0.3 - 1.2 mg/dL 0.5 - 0.4  Alkaline Phos 38 - 126 U/L 141(H) - -  AST 15 - 41 U/L 24 - 17  ALT 0 - 44 U/L 31 - 21   Component     Latest Ref Rng & Units 04/08/2021  Coagulation Factor VIII     56 - 140 % 165 (H)  Ristocetin Co-factor, Plasma     50 - 200 % 121  Von Willebrand Antigen, Plasma     50 - 200 % 172  Prothrombin Time     11.4 - 15.2 seconds 12.4  INR     0.8 - 1.2 0.9  PFA Interpretation        Collagen / Epinephrine  0 - 193 seconds 141  Fibrinogen     210 - 475 mg/dL 605 (H)  Copper     80 - 158 ug/dL 156  Ferritin     11 - 307 ng/mL 60  Vitamin C     0.4 - 2.0 mg/dL 0.9  APTT     24 - 36 seconds 26  Von Willebrand Multimers      Comment    RADIOGRAPHIC STUDIES: I have personally reviewed the radiological images as listed and agreed with the findings in the report. No results found.  ASSESSMENT & PLAN:   60 year old very pleasant female with significant medical comorbidities as noted above with  1) Easy bruisability Patient with low annualized bleeding risk score based on detailed inventory of bleeding symptoms. PLAN -We again reiterated the different reasons for easy bruisability including but not limited to obesity with decreased support to the  cutaneous blood vessels, chronic recurrent steroid use, use of medications that affect platelet function such as SSRIs and NSAIDs. -Discussed optimizing thyroid replacement with primary care physician and also to work on weight loss. -Recommended using compression sleeves for her bilateral upper extremities when doing something active like gardening or warts or anything that involves more physical activity with her upper extremities. -Labs done on 04/08/2021 show no overt evidence of primary bleeding diathesis.  CBC showed normal/slightly higher platelet count at 461k' Platelet function assay within normal limits 1 Willebrand panel and multimers within normal limits -Patient had multiple questions which were answered in detail.   Prothrombin time and APTT within normal limits Vitamin C within normal limits Copper and fluid noted within normal limits.  2) chronic neutrophilia and thrombocytosis likely reactive could be from steroid use, obesity pain due to back issues Plan -Monitor with primary care physician.  Would recommend repeat labs in 2 to 3 months if worsening thrombocytosis or leukocytosis please return to let us.   Follow-up Return to clinic with Dr. Irene Limbo as needed Follow-up with primary care physician  No orders of the defined types were placed in this encounter.   All of the patients questions were answered with apparent satisfaction. The patient knows to call the clinic with any problems, questions or concerns.  . The total time spent in the appointment was 20 minutes and more than 50% was on counseling and direct patient cares.     Sullivan Lone MD [place] AAHIVMS [facility] Hematology/Oncology Physician [facility]

## 2021-04-24 ENCOUNTER — Other Ambulatory Visit: Payer: Self-pay | Admitting: Family Medicine

## 2021-04-26 MED ORDER — AMPHETAMINE-DEXTROAMPHETAMINE 30 MG PO TABS: ORAL_TABLET | ORAL | 0 refills | Status: DC

## 2021-04-26 NOTE — Telephone Encounter (Signed)
Last filled 01/25/2021 Last OV 03/16/2021  Ok to fill?

## 2021-04-28 ENCOUNTER — Ambulatory Visit (INDEPENDENT_AMBULATORY_CARE_PROVIDER_SITE_OTHER): Payer: Medicare Other | Admitting: Psychology

## 2021-04-28 DIAGNOSIS — F509 Eating disorder, unspecified: Secondary | ICD-10-CM | POA: Diagnosis not present

## 2021-04-29 DIAGNOSIS — M7061 Trochanteric bursitis, right hip: Secondary | ICD-10-CM | POA: Diagnosis not present

## 2021-05-02 DIAGNOSIS — G4733 Obstructive sleep apnea (adult) (pediatric): Secondary | ICD-10-CM | POA: Diagnosis not present

## 2021-05-05 ENCOUNTER — Telehealth: Payer: Self-pay

## 2021-05-05 NOTE — Telephone Encounter (Signed)
Pt has been informed that the recommended vaccine Shingrix, may not be covered under their current Medicare insurance. Discussed that they will possibly incur a bill for the Shingrix vaccine & administration fee.  Pt understands that if they want the Shingrix vaccine, Medicare will be billed for an official decision on payment, which will be sent to them in a Medicare Summary Notice (MSN). They understand that if Medicare does not pay, they are responsible for payment.   At this time, the patient decides they will not get the Shingrix vaccine based on the information above.   Pt also has requested that her CT records be sent to a different providers office. Pt advised that requesting office will need to fax over signed medical release for specific records. Pt verb understanding & states she will go to that office to sign release.

## 2021-05-11 ENCOUNTER — Ambulatory Visit: Payer: Medicare Other

## 2021-05-18 DIAGNOSIS — M961 Postlaminectomy syndrome, not elsewhere classified: Secondary | ICD-10-CM | POA: Diagnosis not present

## 2021-05-18 DIAGNOSIS — M5416 Radiculopathy, lumbar region: Secondary | ICD-10-CM | POA: Diagnosis not present

## 2021-06-01 DIAGNOSIS — G4733 Obstructive sleep apnea (adult) (pediatric): Secondary | ICD-10-CM | POA: Diagnosis not present

## 2021-06-08 ENCOUNTER — Ambulatory Visit (INDEPENDENT_AMBULATORY_CARE_PROVIDER_SITE_OTHER): Payer: Medicare Other

## 2021-06-08 VITALS — Ht 64.0 in | Wt 270.0 lb

## 2021-06-08 DIAGNOSIS — Z0000 Encounter for general adult medical examination without abnormal findings: Secondary | ICD-10-CM | POA: Diagnosis not present

## 2021-06-08 NOTE — Patient Instructions (Signed)
[name] Sherman , Thank you for taking time to come for your Medicare Wellness Visit. I appreciate your ongoing commitment to your health goals. Please review the following plan we discussed and let me know if I can assist you in the future.   Screening recommendations/referrals: Colonoscopy: completed 05/07/2019 Mammogram: patient to schedule Bone Density: n/a Recommended yearly ophthalmology/optometry visit for glaucoma screening and checkup Recommended yearly dental visit for hygiene and checkup  Vaccinations: Influenza vaccine: due Pneumococcal vaccine: due Tdap vaccine: completed 03/22/2019 Shingles vaccine: needs second dose  Covid-19:  04/12/2021, 07/01/2020, 12/15/2019, 11/24/2019  Advanced directives: Advance directive discussed with you today.   Conditions/risks identified: none  Next appointment: Follow up in one year for your annual wellness visit.   Preventive Care 40-64 Years, Female Preventive care refers to lifestyle choices and visits with your health care provider that can promote health and wellness. What does preventive care include? A yearly physical exam. This is also called an annual well check. Dental exams once or twice a year. Routine eye exams. Ask your health care provider how often you should have your eyes checked. Personal lifestyle choices, including: Daily care of your teeth and gums. Regular physical activity. Eating a healthy diet. Avoiding tobacco and drug use. Limiting alcohol use. Practicing safe sex. Taking low-dose aspirin daily starting at age 50. Taking vitamin and mineral supplements as recommended by your health care provider. What happens during an annual well check? The services and screenings done by your health care provider during your annual well check will depend on your age, overall health, lifestyle risk factors, and family history of disease. Counseling  Your health care provider may ask you questions about your: Alcohol  use. Tobacco use. Drug use. Emotional well-being. Home and relationship well-being. Sexual activity. Eating habits. Work and work Statistician. Method of birth control. Menstrual cycle. Pregnancy history. Screening  You may have the following tests or measurements: Height, weight, and BMI. Blood pressure. Lipid and cholesterol levels. These may be checked every 5 years, or more frequently if you are over 80 years old. Skin check. Lung cancer screening. You may have this screening every year starting at age 48 if you have a 30-pack-year history of smoking and currently smoke or have quit within the past 15 years. Fecal occult blood test (FOBT) of the stool. You may have this test every year starting at age 8. Flexible sigmoidoscopy or colonoscopy. You may have a sigmoidoscopy every 5 years or a colonoscopy every 10 years starting at age 44. Hepatitis C blood test. Hepatitis B blood test. Sexually transmitted disease (STD) testing. Diabetes screening. This is done by checking your blood sugar (glucose) after you have not eaten for a while (fasting). You may have this done every 1-3 years. Mammogram. This may be done every 1-2 years. Talk to your health care provider about when you should start having regular mammograms. This may depend on whether you have a family history of breast cancer. BRCA-related cancer screening. This may be done if you have a family history of breast, ovarian, tubal, or peritoneal cancers. Pelvic exam and Pap test. This may be done every 3 years starting at age 37. Starting at age 71, this may be done every 5 years if you have a Pap test in combination with an HPV test. Bone density scan. This is done to screen for osteoporosis. You may have this scan if you are at high risk for osteoporosis. Discuss your test results, treatment options, and if necessary, the need  for more tests with your health care provider. Vaccines  Your health care provider may recommend  certain vaccines, such as: Influenza vaccine. This is recommended every year. Tetanus, diphtheria, and acellular pertussis (Tdap, Td) vaccine. You may need a Td booster every 10 years. Zoster vaccine. You may need this after age 38. Pneumococcal 13-valent conjugate (PCV13) vaccine. You may need this if you have certain conditions and were not previously vaccinated. Pneumococcal polysaccharide (PPSV23) vaccine. You may need one or two doses if you smoke cigarettes or if you have certain conditions. Talk to your health care provider about which screenings and vaccines you need and how often you need them. This information is not intended to replace advice given to you by your health care provider. Make sure you discuss any questions you have with your health care provider. Document Released: 07/10/2015 Document Revised: 03/02/2016 Document Reviewed: 04/14/2015 Elsevier Interactive Patient Education  2017 [place] Prevention in the Home Falls can cause injuries. They can happen to people of all ages. There are many things you can do to make your home safe and to help prevent falls. What can I do on the outside of my home? Regularly fix the edges of walkways and driveways and fix any cracks. Remove anything that might make you trip as you walk through a door, such as a raised step or threshold. Trim any bushes or trees on the path to your home. Use bright outdoor lighting. Clear any walking paths of anything that might make someone trip, such as rocks or tools. Regularly check to see if handrails are loose or broken. Make sure that both sides of any steps have handrails. Any raised decks and porches should have guardrails on the edges. Have any leaves, snow, or ice cleared regularly. Use sand or salt on walking paths during winter. Clean up any spills in your garage right away. This includes oil or grease spills. What can I do in the bathroom? Use night lights. Install grab bars  by the toilet and in the tub and shower. Do not use towel bars as grab bars. Use non-skid mats or decals in the tub or shower. If you need to sit down in the shower, use a plastic, non-slip stool. Keep the floor dry. Clean up any water that spills on the floor as soon as it happens. Remove soap buildup in the tub or shower regularly. Attach bath mats securely with double-sided non-slip rug tape. Do not have throw rugs and other things on the floor that can make you trip. What can I do in the bedroom? Use night lights. Make sure that you have a light by your bed that is easy to reach. Do not use any sheets or blankets that are too big for your bed. They should not hang down onto the floor. Have a firm chair that has side arms. You can use this for support while you get dressed. Do not have throw rugs and other things on the floor that can make you trip. What can I do in the kitchen? Clean up any spills right away. Avoid walking on wet floors. Keep items that you use a lot in easy-to-reach places. If you need to reach something above you, use a strong step stool that has a grab bar. Keep electrical cords out of the way. Do not use floor polish or wax that makes floors slippery. If you must use wax, use non-skid floor wax. Do not have throw rugs and other  things on the floor that can make you trip. What can I do with my stairs? Do not leave any items on the stairs. Make sure that there are handrails on both sides of the stairs and use them. Fix handrails that are broken or loose. Make sure that handrails are as long as the stairways. Check any carpeting to make sure that it is firmly attached to the stairs. Fix any carpet that is loose or worn. Avoid having throw rugs at the top or bottom of the stairs. If you do have throw rugs, attach them to the floor with carpet tape. Make sure that you have a light switch at the top of the stairs and the bottom of the stairs. If you do not have them, ask  someone to add them for you. What else can I do to help prevent falls? Wear shoes that: Do not have high heels. Have rubber bottoms. Are comfortable and fit you well. Are closed at the toe. Do not wear sandals. If you use a stepladder: Make sure that it is fully opened. Do not climb a closed stepladder. Make sure that both sides of the stepladder are locked into place. Ask someone to hold it for you, if possible. Clearly mark and make sure that you can see: Any grab bars or handrails. First and last steps. Where the edge of each step is. Use tools that help you move around (mobility aids) if they are needed. These include: Canes. Walkers. Scooters. Crutches. Turn on the lights when you go into a dark area. Replace any light bulbs as soon as they burn out. Set up your furniture so you have a clear path. Avoid moving your furniture around. If any of your floors are uneven, fix them. If there are any pets around you, be aware of where they are. Review your medicines with your doctor. Some medicines can make you feel dizzy. This can increase your chance of falling. Ask your doctor what other things that you can do to help prevent falls. This information is not intended to replace advice given to you by your health care provider. Make sure you discuss any questions you have with your health care provider. Document Released: 04/09/2009 Document Revised: 11/19/2015 Document Reviewed: 07/18/2014 Elsevier Interactive Patient Education  2017 [employer].

## 2021-06-08 NOTE — Progress Notes (Signed)
I connected with [name] Sherman today by telephone and verified that I am speaking with the correct person using two identifiers. Location patient: home Location provider: work Persons participating in the virtual visit: Geophysicist/field seismologist, Glenna Durand LPN.   I discussed the limitations, risks, security and privacy concerns of performing an evaluation and management service by telephone and the availability of in person appointments. I also discussed with the patient that there may be a patient responsible charge related to this service. The patient expressed understanding and verbally consented to this telephonic visit.    Interactive audio and video telecommunications were attempted between this provider and patient, however failed, due to patient having technical difficulties OR patient did not have access to video capability.  We continued and completed visit with audio only.     Vital signs may be patient reported or missing.  Subjective:   [name] Sherman is a 60 y.o. female who presents for an Initial Medicare Annual Wellness Visit.  Review of Systems     Cardiac Risk Factors include: diabetes mellitus;dyslipidemia;hypertension;obesity (BMI >30kg/m2)     Objective:    Today's Vitals   06/08/21 1046 06/08/21 1047  Weight: 270 lb (122.5 kg)   Height: $Remove'5\' 4"'rDSKSJc$  (1.626 m)   PainSc:  6    Body mass index is 46.35 kg/m.  Advanced Directives 06/08/2021 01/30/2020 01/17/2019 08/13/2018 03/24/2017 08/31/2016 06/09/2015  Does Patient Have a Medical Advance Directive? No No No No No No -  Does patient want to make changes to medical advance directive? - - - No - Patient declined - - -  Would patient like information on creating a medical advance directive? - No - Patient declined - No - Patient declined - Yes (MAU/Ambulatory/Procedural Areas - Information given) No - patient declined information    Current Medications (verified) Outpatient Encounter Medications as of 06/08/2021  Medication Sig    Accu-Chek Softclix Lancets lancets Use as instructed   amphetamine-dextroamphetamine (ADDERALL) 30 MG tablet Take one tablet by mouth twice daily.  May refill in one month.   amphetamine-dextroamphetamine (ADDERALL) 30 MG tablet Take one tablet twice daily.   amphetamine-dextroamphetamine (ADDERALL) 30 MG tablet Take 1 tablet by mouth twice daily.  May refill in 2 months.   atorvastatin (LIPITOR) 10 MG tablet TAKE 1 TABLET BY MOUTH DAILY   Biotin 1000 MCG tablet Take 1,000 mcg by mouth 3 (three) times daily.   blood glucose meter kit and supplies Dispense based on patient and insurance preference. Use up to four times daily as directed. (FOR ICD-10 E10.9, E11.9).   citalopram (CELEXA) 20 MG tablet Take 1 tablet (20 mg total) by mouth daily.   cyclobenzaprine (FLEXERIL) 10 MG tablet Take 10 mg by mouth 2 (two) times daily.   furosemide (LASIX) 20 MG tablet TAKE 1 TABLET BY MOUTH ONCE A DAY AS NEEDED FOR SWELLING   glucose blood test strip Use as instructed   losartan-hydrochlorothiazide (HYZAAR) 100-12.5 MG tablet TAKE 1 TABLET BY MOUTH EVERY DAY   nortriptyline (PAMELOR) 25 MG capsule Take by mouth.   nystatin cream (MYCOSTATIN) Apply 1 application topically 2 (two) times daily.   ondansetron (ZOFRAN) 8 MG tablet Take 1 tablet (8 mg total) by mouth every 8 (eight) hours as needed for nausea or vomiting.   oxyCODONE-acetaminophen (PERCOCET/ROXICET) 5-325 MG tablet Take 1-2 tablets by mouth every 4 (four) hours as needed for moderate pain or severe pain.   pantoprazole (PROTONIX) 40 MG tablet TAKE 1 TABLET(40 MG) BY MOUTH TWICE DAILY  Probiotic Product (ALIGN PO) Take 1 capsule by mouth daily.   Semaglutide, 1 MG/DOSE, (OZEMPIC, 1 MG/DOSE,) 2 MG/1.5ML SOPN Inject 1 mg into the skin once a week.   SUMAtriptan (IMITREX) 100 MG tablet May repeat in 2 hours if headache persists or recurs.  Do not take more than 2 in 24 hours (Patient taking differently: Take 100 mg by mouth every 2 (two) hours as  needed for migraine. May repeat in 2 hours if headache persists or recurs.  Do not take more than 2 in 24 hours)   SYNTHROID 50 MCG tablet TAKE 1 TABLET BY MOUTH EVERY DAY   Turmeric-Ginger 135-6 MG CHEW Chew 2 each by mouth daily.   Vitamin D, Ergocalciferol, (DRISDOL) 1.25 MG (50000 UNIT) CAPS capsule TAKE 1 CAPSULE BY MOUTH 1 TIME A WEEK (Patient taking differently: Take 50,000 Units by mouth every Monday. TAKE 1 CAPSULE BY MOUTH 1 TIME A WEEK)   pramipexole (MIRAPEX) 0.125 MG tablet TAKE 1-2 TABLET EVERY EVENING AS NEEDED FOR RESTLESS LEG SYNDROME (Patient not taking: Reported on 04/08/2021)   rosuvastatin (CRESTOR) 10 MG tablet Take 1 tablet (10 mg total) by mouth daily. (Patient not taking: Reported on 06/08/2021)   [DISCONTINUED] pantoprazole (PROTONIX) 40 MG tablet TAKE 1 TABLET(40 MG) BY MOUTH TWICE DAILY   No facility-administered encounter medications on file as of 06/08/2021.    Allergies (verified) Keflex [cephalexin]   History: Past Medical History:  Diagnosis Date   ADD (attention deficit disorder)    Anemia    Anxiety    Arthritis    shoulder, right (arthritis, tendonitis, bursitis)   Cancer (HCC)    cervical cancer (conization)    Chronic kidney disease    Complication of anesthesia    COVID-19 07/2019   Depression    Dyspnea 2020   GERD (gastroesophageal reflux disease)    History of cervical cancer    s/p  laser ablation of cervix 1980's   History of kidney stones    Hyperlipidemia    Hypertension    Hypothyroidism    IBS (irritable bowel syndrome)    Irregular heart rate    as a child, has never had any problems    Kidney stones    Left ureteral stone    Migraine    OSA (obstructive sleep apnea)    pt used cpap up until 2013 states lost wt and did not need anymore   PONV (postoperative nausea and vomiting)    Pre-diabetes    Restless legs    Sleep apnea    wears CPAP   Umbilical hernia without obstruction or gangrene    Urgency of urination     Vertigo    Vitamin D deficiency    Past Surgical History:  Procedure Laterality Date   ABDOMINAL HYSTERECTOMY     BACK SURGERY  04/27/2018   and in 2021 (Fusion)   BUNIONECTOMY Right 2004   COLONOSCOPY     CYSTOSCOPY W/ RETROGRADES Bilateral 05/26/2015   Procedure: CYSTOSCOPY WITH RETROGRADE PYELOGRAM;  Surgeon: Festus Aloe, MD;  Location: [facility];  Service: Urology;  Laterality: Bilateral;   CYSTOSCOPY W/ URETERAL STENT REMOVAL Left 05/26/2015   Procedure: CYSTOSCOPY WITH STENT REMOVAL;  Surgeon: Festus Aloe, MD;  Location: [facility];  Service: Urology;  Laterality: Left;   CYSTOSCOPY WITH RETROGRADE PYELOGRAM, URETEROSCOPY AND STENT PLACEMENT Left 05/01/2015   Procedure: CYSTOSCOPY WITH RETROGRADE PYELOGRAM AND STENT PLACEMENT;  Surgeon: Festus Aloe, MD;  Location: [facility];  Service: Urology;  Laterality: Left;   CYSTOSCOPY WITH STENT PLACEMENT Bilateral 05/26/2015   Procedure: CYSTOSCOPY WITH STENT PLACEMENT;  Surgeon: Festus Aloe, MD;  [facility];  Service: Urology;  Laterality: Bilateral;   CYSTOSCOPY WITH URETEROSCOPY Bilateral 05/26/2015   Procedure: CYSTOSCOPY WITH URETEROSCOPY;  Surgeon: Festus Aloe, MD;  Location: [facility];  Service: Urology;  Laterality: Bilateral;   CYSTOSCOPY/URETEROSCOPY/HOLMIUM LASER/STENT PLACEMENT Right 06/09/2015   Procedure: CYSTOSCOPY/URETEROSCOPY/STENT PLACEMENT REMOVAL LEFT URETERAL STENT;  Surgeon: Festus Aloe, MD;  Location: [facility];  Service: Urology;  Laterality: Right;   DIAGNOSTIC LAPAROSCOPY     HERNIA REPAIR     HOLMIUM LASER APPLICATION Left 41/74/0814   Procedure: HOLMIUM LASER APPLICATION;  Surgeon: Festus Aloe, MD;  Location: [facility];  Service: Urology;  Laterality: Left;   HOLMIUM LASER APPLICATION Right 48/18/5631   Procedure: HOLMIUM LASER APPLICATION;  Surgeon: Festus Aloe, MD;  Location: [facility];   Service: Urology;  Laterality: Right;   INSERTION OF MESH N/A 09/05/2016   Procedure: INSERTION OF MESH;  Surgeon: [name], MD;  Location: [place];  Service: General;  Laterality: N/A;   LAPAROSCOPIC ASSISTED VAGINAL HYSTERECTOMY  04-23-2002   and Left Salpingoophorectomy/  Anterior Repair/  Tension Free Tape Sling placement   LASER ABLATION OF THE CERVIX  x2  1980's   TUBAL LIGATION  1980's   VENTRAL HERNIA REPAIR N/A 09/05/2016   Procedure: LAPAROSCOPIC VENTRAL HERNIA;  Surgeon: [name], MD;  Location: [place];  Service: General;  Laterality: N/A;   Family History  Problem Relation Age of Onset   Alcohol abuse Mother    Lung cancer Mother    Colon cancer Mother    Arthritis Father    Hyperlipidemia Father    Heart disease Father    Hypertension Father    COPD Father    Kidney disease Paternal Uncle    Cancer Maternal Grandmother        colon   Colon cancer Maternal Grandmother    Rectal cancer Maternal Grandmother    Cancer Paternal Grandmother        breast   Kidney cancer Sister        lung cancer   Cirrhosis Sister    Esophageal cancer Neg Hx    Stomach cancer Neg Hx    Social History   Socioeconomic History   Marital status: Legally Separated    Spouse name: Not on file   Number of children: Not on file   Years of education: Not on file   Highest education level: Not on file  Occupational History   Not on file  Tobacco Use   Smoking status: Never   Smokeless tobacco: Never  Vaping Use   Vaping Use: Never used  Substance and Sexual Activity   Alcohol use: Never    Comment: 1 x year   Drug use: Never   Sexual activity: Yes  Other Topics Concern   Not on file  Social History Narrative   Not on file   Social Determinants of Health   Financial Resource Strain: Low Risk    Difficulty of Paying Living Expenses: Not hard at all  Food Insecurity: No Food Insecurity   Worried About Charity fundraiser in the Last Year: Never true   Academic librarian in the Last Year: Never true  Transportation Needs: No Transportation Needs   Lack of Transportation (Medical): No   Lack of Transportation (Non-Medical): No  Physical Activity: Sufficiently  Active   Days of Exercise per Week: 7 days   Minutes of Exercise per Session: 30 min  Stress: No Stress Concern Present   Feeling of Stress : Only a little  Social Connections: Not on file    Tobacco Counseling Counseling given: Not Answered   Clinical Intake:  Pre-visit preparation completed: Yes  Pain : 0-10 Pain Score: 6  Pain Type: Chronic pain Pain Location: Back Pain Orientation: Lower Pain Descriptors / Indicators: Aching, Constant Pain Onset: More than a month ago Pain Frequency: Constant     Nutritional Status: BMI > 30  Obese Nutritional Risks: None Diabetes: Yes  How often do you need to have someone help you when you read instructions, pamphlets, or other written materials from your doctor or pharmacy?: 1 - Never What is the last grade level you completed in school?: almost masters  Diabetic? Yes Nutrition Risk Assessment:  Has the patient had any N/V/D within the last 2 months?  No  Does the patient have any non-healing wounds?  No  Has the patient had any unintentional weight loss or weight gain?  No   Diabetes:  Is the patient diabetic?  Yes  If diabetic, was a CBG obtained today?  No  Did the patient bring in their glucometer from home?  No  How often do you monitor your CBG's? Every other day.   Financial Strains and Diabetes Management:  Are you having any financial strains with the device, your supplies or your medication? No .  Does the patient want to be seen by Chronic Care Management for management of their diabetes?  No  Would the patient like to be referred to a Nutritionist or for Diabetic Management?  No   Diabetic Exams:  Diabetic Eye Exam: Overdue for diabetic eye exam. Pt has been advised about the importance in completing this exam.  Patient advised to call and schedule an eye exam. Diabetic Foot Exam: Overdue, Pt has been advised about the importance in completing this exam. Pt is scheduled for diabetic foot exam on next appointment.   Interpreter Needed?: No  Information entered by :: NAllen LPN   Activities of Daily Living In your present state of health, do you have any difficulty performing the following activities: 06/08/2021  Hearing? N  Vision? N  Difficulty concentrating or making decisions? Y  Comment since covid  Walking or climbing stairs? Y  Dressing or bathing? N  Doing errands, shopping? N  Preparing Food and eating ? N  Using the Toilet? N  In the past six months, have you accidently leaked urine? Y  Do you have problems with loss of bowel control? N  Managing your Medications? N  Managing your Finances? N  Housekeeping or managing your Housekeeping? N  Some recent data might be hidden    Patient Care Team: Kristian Covey, MD as PCP - General (Family Medicine)  Indicate any recent Medical Services you may have received from other than [facility] providers in the past year (date may be approximate).     Assessment:   This is a routine wellness examination for [name] Sherman.  Hearing/Vision screen Vision Screening - Comments:: Regular eye exams, [facility]  Dietary issues and exercise activities discussed: Current Exercise Habits: Home exercise routine, Type of exercise: walking, Time (Minutes): 30, Frequency (Times/Week): 7, Weekly Exercise (Minutes/Week): 210   Goals Addressed             This Visit's Progress    Patient Stated  06/08/2021, having weight loss surgery in January       Depression Screen PHQ 2/9 Scores 06/08/2021 10/05/2020 04/16/2018  PHQ - 2 Score 0 0 5  PHQ- 9 Score - - 18    Fall Risk Fall Risk  06/08/2021 10/05/2020  Falls in the past year? 1 0  Comment lost balance -  Number falls in past yr: 0 -  Injury with Fall? 1 -  Risk for fall due to :  Impaired balance/gait;Impaired mobility;Medication side effect -  Follow up Falls evaluation completed;Education provided;Falls prevention discussed -    FALL RISK PREVENTION PERTAINING TO THE HOME:  Any stairs in or around the home? No  If so, are there any without handrails? N/a Home free of loose throw rugs in walkways, pet beds, electrical cords, etc? Yes  Adequate lighting in your home to reduce risk of falls? Yes   ASSISTIVE DEVICES UTILIZED TO PREVENT FALLS:  Life alert? No  Use of a cane, walker or w/c? Yes  Grab bars in the bathroom? No  Shower chair or bench in shower? Yes  Elevated toilet seat or a handicapped toilet? Yes   TIMED UP AND GO:  Was the test performed? No .      Cognitive Function:        Immunizations Immunization History  Administered Date(s) Administered   Influenza,inj,Quad PF,6+ Mos 05/11/2015, 04/04/2017, 04/16/2018, 03/22/2019, 03/23/2020   PFIZER(Purple Top)SARS-COV-2 Vaccination 11/24/2019, 12/15/2019, 07/01/2020   Pfizer Covid-19 Vaccine Bivalent Booster 44yrs & up 04/12/2021   Tdap 07/17/2008, 03/22/2019   Zoster Recombinat (Shingrix) 03/16/2021    TDAP status: Up to date  Flu Vaccine status: Due, Education has been provided regarding the importance of this vaccine. Advised may receive this vaccine at local pharmacy or Health Dept. Aware to provide a copy of the vaccination record if obtained from local pharmacy or Health Dept. Verbalized acceptance and understanding.  Pneumococcal vaccine status: Due, Education has been provided regarding the importance of this vaccine. Advised may receive this vaccine at local pharmacy or Health Dept. Aware to provide a copy of the vaccination record if obtained from local pharmacy or Health Dept. Verbalized acceptance and understanding.  Covid-19 vaccine status: Completed vaccines  Qualifies for Shingles Vaccine? Yes   Zostavax completed No   Shingrix Completed?: needs second dose  Screening  Tests Health Maintenance  Topic Date Due   Pneumococcal Vaccine 84-66 Years old (1 - PCV) Never done   FOOT EXAM  Never done   OPHTHALMOLOGY EXAM  Never done   INFLUENZA VACCINE  01/25/2021   MAMMOGRAM  03/28/2021   HEMOGLOBIN A1C  04/03/2021   Zoster Vaccines- Shingrix (2 of 2) 05/11/2021   PAP SMEAR-Modifier  02/12/2022   COLONOSCOPY (Pts 45-71yrs Insurance coverage will need to be confirmed)  05/06/2026   TETANUS/TDAP  03/21/2029   COVID-19 Vaccine  Completed   Hepatitis C Screening  Completed   HIV Screening  Completed   HPV VACCINES  Aged Out    Health Maintenance  Health Maintenance Due  Topic Date Due   Pneumococcal Vaccine 10-35 Years old (1 - PCV) Never done   FOOT EXAM  Never done   OPHTHALMOLOGY EXAM  Never done   INFLUENZA VACCINE  01/25/2021   MAMMOGRAM  03/28/2021   HEMOGLOBIN A1C  04/03/2021   Zoster Vaccines- Shingrix (2 of 2) 05/11/2021    Colorectal cancer screening: Type of screening: Colonoscopy. Completed 05/07/2019. Repeat every 7 years  Mammogram status: patient to schedule  Bone Density status:  n/a  Lung Cancer Screening: (Low Dose CT Chest recommended if Age 90-80 years, 30 pack-year currently smoking OR have quit w/in 15years.) does not qualify.   Lung Cancer Screening Referral: no  Additional Screening:  Hepatitis C Screening: does qualify; Completed 07/06/2015  Vision Screening: Recommended annual ophthalmology exams for early detection of glaucoma and other disorders of the eye. Is the patient up to date with their annual eye exam?  Yes  Who is the provider or what is the name of the office in which the patient attends annual eye exams? [facility] If pt is not established with a provider, would they like to be referred to a provider to establish care? No .   Dental Screening: Recommended annual dental exams for proper oral hygiene  Community Resource Referral / Chronic Care Management: CRR required this visit?  No   CCM required  this visit?  No      Plan:     I have personally reviewed and noted the following in the patients chart:   Medical and social history Use of alcohol, tobacco or illicit drugs  Current medications and supplements including opioid prescriptions. Patient is currently taking opioid prescriptions. Information provided to patient regarding non-opioid alternatives. Patient advised to discuss non-opioid treatment plan with their provider. Functional ability and status Nutritional status Physical activity Advanced directives List of other physicians Hospitalizations, surgeries, and ER visits in previous 12 months Vitals Screenings to include cognitive, depression, and falls Referrals and appointments  In addition, I have reviewed and discussed with patient certain preventive protocols, quality metrics, and best practice recommendations. A written personalized care plan for preventive services as well as general preventive health recommendations were provided to patient.     Kellie Simmering, LPN   13/24/4010   Nurse Notes: 6 CIT not administered. Patient appeared cognitive per conversation.

## 2021-06-29 DIAGNOSIS — M5416 Radiculopathy, lumbar region: Secondary | ICD-10-CM | POA: Diagnosis not present

## 2021-06-30 ENCOUNTER — Encounter: Payer: Self-pay | Admitting: Nurse Practitioner

## 2021-06-30 ENCOUNTER — Ambulatory Visit: Payer: Medicare Other | Admitting: Nurse Practitioner

## 2021-06-30 VITALS — BP 130/86 | HR 125 | Ht 64.0 in | Wt 274.2 lb

## 2021-06-30 DIAGNOSIS — R935 Abnormal findings on diagnostic imaging of other abdominal regions, including retroperitoneum: Secondary | ICD-10-CM

## 2021-06-30 NOTE — Progress Notes (Signed)
ASSESSMENT AND PLAN    # 61 yo female with chronic nausea, vomiting possibly related to chronically incarcerated ventral hernia as symptoms improved per patient following hernia repair in 2028. Developed. abdominal hernia and improved after repair. She has been undergoing evaluation for bariatric surgery.  Upper GI series March 2022 suggested delayed gastric emptying , however patient was taking Ozempic at the time .  We saw her in March 2022 for further evaluation and recommended a gastric emptying study which was scheduled but never done . [facility] has referred her back to continue work-up for possible gastroparesis .  In the interim, her symptoms have completely resolved off Ozempic for the last month .Patient tells me that she explained to [facility] that her nausea/vomiting had resolved off Ozempic but they still wanted her to see [place] again --Will proceed with gastric emptying study to make sure she doesn't have delayed gastric emptying prior to bariatric surgery.    HISTORY OF PRESENT ILLNESS    Chief Complaint : abnormal UGI series in March 2022  [name] Sherman is a 61 y.o. female known to Dr. Havery Moros with a past medical history of cervical cancer, reported IBS, arthritis, chronic abdominal pain, chronic back pain,  hepatic steatosis, ventral hernia repair x2, morbid obesity. Additional medical history as listed in [place] .   [name] Sherman has been previously evaluated for chronic abdominal pain, altered bowel habits , chronic nausea, GERD.  The cause of her  chronic GI abdominal pain has never been determined. However she symptoms has never been clear. Previous workup has included EGD, colonoscopy, CTAP, RUQ [place], UGI series   Patient was last seen  in April 2022 for evaluation of possible gastroparesis based on UGI series suggesting delayed gastric emptying. We recommended a gastric emptying study off Ozempic. This was scheduled for 10/28/20 but she didn't make the appointment and cannot remember being  scheduled for it.    Patient is being worked up for bariatric surgery. She has been sent back by [facility] to continue workup for possible gastroparesis. Patient hasn't actually seen [facility] since March 2022 ( prior to when we saw her in April 2022) but their office staff called patient a few weeks ago and asked her to follow back up with [place].  Patient has been off [place] for a month ( out of stock at pharmacy) and her nausea / vomiting have resolved.  She is eager to have the bariatric surgery. Patient feels like the nausea and vomiting were definitely related to Ozempic. She feels like the nausea / vomiting predating Ozempic was related to the abdominal hernias since symptoms improved after hernia repair.    PREVIOUS ENDOSCOPIC EVALUATIONS / PERTINENT STUDIES:   November 2020 colonoscopy for altered bowel habits. --Two 3 mm cecal polyps removed/  -- Liquid residual stool noted throughout, worse in the right colon, extensive lavage required --Biopsies taken to rule out microscopic colitis  November 2020 EGD for abdominal pain, nausea --Esophagogastric landmarks identified. - 1cm sliding hiatal hernia - Normal esophagus. - Normal stomach. Biopsied to rule out H pylori. - Normal duodenal  Diagnosis 1. Surgical [P], small bowel - BENIGN DUODENAL MUCOSA - NO ACUTE INFLAMMATION, VILLOUS BLUNTING OR INCREASED INTRAEPITHELIAL LYMPHOCYTES 2. Surgical [P], gastric antrum and gastric body - BENIGN GASTRIC MUCOSA - NO H. PYLORI, INTESTINAL METAPLASIA OR MALIGNANCY IDENTIFIED 3. Surgical [P], colon, cecum, polyp (2) - TUBULAR ADENOMA (1 OF 2 FRAGMENTS) - BENIGN COLONIC MUCOSA (1 OF 2 FRAGMENTS) - NO HIGH GRADE DYSPLASIA OR  MALIGNANCY IDENTIFIED 4. Surgical [P], random sites - BENIGN COLONIC MUCOSA - NO ACTIVE INFLAMMATION OR EVIDENCE OF MICROSCOPIC COLITIS - NO HIGH GRADE DYSPLASIA OR MALIGNANCY IDENTIFIED   March 2022 UGI series 1. Limited single contrast examination demonstrating retained gastric  contents and failure to empty through the pylorus. Findings may suggest gastoparesis. Within these limitations, grossly normal gastric anatomy is noted.  She has had a hernia repair twice without any relief in symptoms.     Current Medications, Allergies, Past Medical History, Past Surgical History, Family History and Social History were reviewed in [employer] record.     Current Outpatient Medications  Medication Sig Dispense Refill   Accu-Chek Softclix Lancets lancets Use as instructed 100 each 12   amphetamine-dextroamphetamine (ADDERALL) 30 MG tablet Take one tablet by mouth twice daily.  May refill in one month. 60 tablet 0   amphetamine-dextroamphetamine (ADDERALL) 30 MG tablet Take one tablet twice daily. 60 tablet 0   amphetamine-dextroamphetamine (ADDERALL) 30 MG tablet Take 1 tablet by mouth twice daily.  May refill in 2 months. 60 tablet 0   atorvastatin (LIPITOR) 10 MG tablet TAKE 1 TABLET BY MOUTH DAILY 90 tablet 2   Biotin 1000 MCG tablet Take 1,000 mcg by mouth 3 (three) times daily.     blood glucose meter kit and supplies Dispense based on patient and insurance preference. Use up to four times daily as directed. (FOR ICD-10 E10.9, E11.9). 1 each 0   citalopram (CELEXA) 20 MG tablet Take 1 tablet (20 mg total) by mouth daily. 90 tablet 3   cyclobenzaprine (FLEXERIL) 10 MG tablet Take 10 mg by mouth 2 (two) times daily.     furosemide (LASIX) 20 MG tablet TAKE 1 TABLET BY MOUTH ONCE A DAY AS NEEDED FOR SWELLING 30 tablet 1   glucose blood test strip Use as instructed 100 each 12   losartan-hydrochlorothiazide (HYZAAR) 100-12.5 MG tablet TAKE 1 TABLET BY MOUTH EVERY DAY 90 tablet 1   nortriptyline (PAMELOR) 25 MG capsule Take by mouth.     nystatin cream (MYCOSTATIN) Apply 1 application topically 2 (two) times daily. 30 g 1   ondansetron (ZOFRAN) 8 MG tablet Take 1 tablet (8 mg total) by mouth every 8 (eight) hours as needed for nausea or vomiting. 60  tablet 1   oxyCODONE-acetaminophen (PERCOCET/ROXICET) 5-325 MG tablet Take 1-2 tablets by mouth every 4 (four) hours as needed for moderate pain or severe pain. 60 tablet 0   pantoprazole (PROTONIX) 40 MG tablet TAKE 1 TABLET(40 MG) BY MOUTH TWICE DAILY 180 tablet 1   pramipexole (MIRAPEX) 0.125 MG tablet TAKE 1-2 TABLET EVERY EVENING AS NEEDED FOR RESTLESS LEG SYNDROME (Patient not taking: Reported on 04/08/2021) 60 tablet 3   Probiotic Product (ALIGN PO) Take 1 capsule by mouth daily.     rosuvastatin (CRESTOR) 10 MG tablet Take 1 tablet (10 mg total) by mouth daily. (Patient not taking: Reported on 06/08/2021) 90 tablet 1   Semaglutide, 1 MG/DOSE, (OZEMPIC, 1 MG/DOSE,) 2 MG/1.5ML SOPN Inject 1 mg into the skin once a week. 3 mL 5   SUMAtriptan (IMITREX) 100 MG tablet May repeat in 2 hours if headache persists or recurs.  Do not take more than 2 in 24 hours (Patient taking differently: Take 100 mg by mouth every 2 (two) hours as needed for migraine. May repeat in 2 hours if headache persists or recurs.  Do not take more than 2 in 24 hours) 10 tablet 11   SYNTHROID 50  MCG tablet TAKE 1 TABLET BY MOUTH EVERY DAY 90 tablet 0   Turmeric-Ginger 135-6 MG CHEW Chew 2 each by mouth daily.     Vitamin D, Ergocalciferol, (DRISDOL) 1.25 MG (50000 UNIT) CAPS capsule TAKE 1 CAPSULE BY MOUTH 1 TIME A WEEK (Patient taking differently: Take 50,000 Units by mouth every Monday. TAKE 1 CAPSULE BY MOUTH 1 TIME A WEEK) 12 capsule 3   No current facility-administered medications for this visit.    Review of Systems: No chest pain. No shortness of breath. No urinary complaints.   PHYSICAL EXAM :    Wt Readings from Last 3 Encounters:  06/08/21 270 lb (122.5 kg)  04/15/21 268 lb (121.6 kg)  04/08/21 265 lb 14.4 oz (120.6 kg)    BP 130/86    Pulse (!) 125    Ht _0  (1.626 m)    Wt 274 lb 3.2 oz (124.4 kg)    BMI 47.07 kg/m  Constitutional:  Pleasant, obese female in no acute distress. Psychiatric:  Normal  mood and affect. Behavior is normal. EENT: Pupils normal.  Conjunctivae are normal. No scleral icterus. Neck supple.  Cardiovascular: Normal rate, regular rhythm. No edema Pulmonary/chest: Effort normal and breath sounds normal. No wheezing, rales or rhonchi. Abdominal: Soft, nondistended, nontender. Bowel sounds active throughout. There are no masses palpable. No hepatomegaly. Neurological: Alert and oriented to person place and time. Skin: Skin is warm and dry. No rashes noted.  Tye Savoy, NP  06/30/2021, 9:15 AM  Cc:  [name], MD

## 2021-06-30 NOTE — Progress Notes (Signed)
Agree with assessment and plan as outlined.  

## 2021-06-30 NOTE — Patient Instructions (Addendum)
If you are age 59 or younger, your body mass index should be between 19-25. Your Body mass index is 47.07 kg/m. If this is out of the aformentioned range listed, please consider follow up with your Primary Care Provider.  ________________________________________________________  The [place] [facility] providers would like to encourage you to use [facility] to communicate with providers for non-urgent requests or questions.  Due to long hold times on the telephone, sending your provider a message by [facility] may be a faster and more efficient way to get a response.  Please allow 48 business hours for a response.  Please remember that this is for non-urgent requests.  _______________________________________________________  Dennis Bast have been scheduled for a gastric emptying scan at [facility] on 07/08/2021 at 11:00 am. Please arrive at least 30 minutes prior to your appointment for registration. Please make certain not to have anything to eat or drink after midnight the night before your test. Hold all stomach medications (ex: Zofran, phenergan, Reglan) 48 hours prior to your test. If you need to reschedule your appointment, please contact radiology scheduling at [phone]. _____________________________________________________________________ A gastric-emptying study measures how long it takes for food to move through your stomach. There are several ways to measure stomach emptying. In the most common test, you eat food that contains a small amount of radioactive material. A scanner that detects the movement of the radioactive material is placed over your abdomen to monitor the rate at which food leaves your stomach. This test normally takes about 4 hours to complete. _____________________________________________________________________   Follow up pending the results of your Gastric Emptying scan.  Thank you for entrusting me with your care and choosing [facility].  Tye Savoy, NP-C

## 2021-07-08 ENCOUNTER — Ambulatory Visit (HOSPITAL_COMMUNITY)
Admission: RE | Admit: 2021-07-08 | Discharge: 2021-07-08 | Disposition: A | Discharge status: Home / Self Care | Payer: Medicare Other | Source: Ambulatory Visit | Attending: Nurse Practitioner | Admitting: Nurse Practitioner

## 2021-07-08 ENCOUNTER — Other Ambulatory Visit: Payer: Self-pay

## 2021-07-08 DIAGNOSIS — R110 Nausea: Secondary | ICD-10-CM | POA: Diagnosis not present

## 2021-07-08 DIAGNOSIS — R935 Abnormal findings on diagnostic imaging of other abdominal regions, including retroperitoneum: Secondary | ICD-10-CM | POA: Diagnosis not present

## 2021-07-08 DIAGNOSIS — R1084 Generalized abdominal pain: Secondary | ICD-10-CM | POA: Diagnosis not present

## 2021-07-08 DIAGNOSIS — E119 Type 2 diabetes mellitus without complications: Secondary | ICD-10-CM | POA: Diagnosis not present

## 2021-07-08 MED ORDER — TECHNETIUM TC 99M SULFUR COLLOID
1.9000 | Freq: Once | INTRAVENOUS | Status: AC | PRN
  Administered 2021-07-08: 1.9 via INTRAVENOUS

## 2021-07-09 ENCOUNTER — Ambulatory Visit (HOSPITAL_COMMUNITY): Payer: Medicare Other

## 2021-07-16 DIAGNOSIS — I10 Essential (primary) hypertension: Secondary | ICD-10-CM | POA: Diagnosis not present

## 2021-07-16 DIAGNOSIS — M1990 Unspecified osteoarthritis, unspecified site: Secondary | ICD-10-CM | POA: Diagnosis not present

## 2021-07-16 DIAGNOSIS — G4733 Obstructive sleep apnea (adult) (pediatric): Secondary | ICD-10-CM | POA: Diagnosis not present

## 2021-07-21 DIAGNOSIS — N764 Abscess of vulva: Secondary | ICD-10-CM | POA: Diagnosis not present

## 2021-07-21 DIAGNOSIS — N760 Acute vaginitis: Secondary | ICD-10-CM | POA: Diagnosis not present

## 2021-07-23 ENCOUNTER — Other Ambulatory Visit: Payer: Self-pay | Admitting: Family Medicine

## 2021-07-23 ENCOUNTER — Encounter: Payer: Self-pay | Admitting: Family Medicine

## 2021-07-23 NOTE — Telephone Encounter (Signed)
Patient called in to check up on mychart message update.   Please advise.

## 2021-07-23 NOTE — Telephone Encounter (Signed)
Adderall last filled 04/26/2021 Last OV 03/16/2021  Ok to fill?  Please advise on ozempic.

## 2021-07-25 ENCOUNTER — Other Ambulatory Visit: Payer: Self-pay | Admitting: Family Medicine

## 2021-07-26 ENCOUNTER — Other Ambulatory Visit: Payer: Self-pay | Admitting: Family Medicine

## 2021-07-26 MED ORDER — OZEMPIC (0.25 OR 0.5 MG/DOSE) 2 MG/1.5ML ~~LOC~~ SOPN: PEN_INJECTOR | SUBCUTANEOUS | 2 refills | Status: DC

## 2021-07-26 MED ORDER — AMPHETAMINE-DEXTROAMPHETAMINE 30 MG PO TABS: ORAL_TABLET | ORAL | 0 refills | Status: DC

## 2021-07-26 NOTE — Addendum Note (Signed)
Addended by: Rebecca Eaton on: 07/26/2021 07:56 AM   Modules accepted: Orders

## 2021-07-26 NOTE — Telephone Encounter (Signed)
I refilled her Adderall.  Okay to start back Ozempic.  Need to start back Ozempic 0.25 mg subcutaneous once weekly for 4 weeks and then titrate up to 0.5 mg subcutaneous once weekly

## 2021-07-27 DIAGNOSIS — M961 Postlaminectomy syndrome, not elsewhere classified: Secondary | ICD-10-CM | POA: Diagnosis not present

## 2021-07-27 DIAGNOSIS — M5416 Radiculopathy, lumbar region: Secondary | ICD-10-CM | POA: Diagnosis not present

## 2021-07-27 DIAGNOSIS — M419 Scoliosis, unspecified: Secondary | ICD-10-CM | POA: Diagnosis not present

## 2021-07-27 DIAGNOSIS — F1120 Opioid dependence, uncomplicated: Secondary | ICD-10-CM | POA: Diagnosis not present

## 2021-07-27 NOTE — Telephone Encounter (Signed)
Discussed this Via mychart.

## 2021-07-29 DIAGNOSIS — N907 Vulvar cyst: Secondary | ICD-10-CM | POA: Diagnosis not present

## 2021-08-09 ENCOUNTER — Other Ambulatory Visit: Payer: Self-pay

## 2021-08-09 ENCOUNTER — Encounter: Payer: Medicare Other | Attending: Surgery | Admitting: Skilled Nursing Facility1

## 2021-08-09 DIAGNOSIS — E1165 Type 2 diabetes mellitus with hyperglycemia: Secondary | ICD-10-CM | POA: Diagnosis not present

## 2021-08-09 NOTE — Progress Notes (Signed)
Pre-Operative Nutrition Class:    Patient was seen on 08/09/2021 for Pre-Operative Bariatric Surgery Education at [facility].    Surgery date:  Surgery type: sleeve Start weight at [facility]: 274.1 Weight today: 267.5   The following the learning objectives were met by the patient during this course: Identify Pre-Op Dietary Goals and will begin 2 weeks pre-operatively Identify appropriate sources of fluids and proteins  State protein recommendations and appropriate sources pre and post-operatively Identify Post-Operative Dietary Goals and will follow for 2 weeks post-operatively Identify appropriate multivitamin and calcium sources Describe the need for physical activity post-operatively and will follow MD recommendations State when to call healthcare provider regarding medication questions or post-operative complications When having a diagnosis of diabetes understanding hypoglycemia symptoms and the inclusion of 1 complex carbohydrate per meal  Handouts given during class include: Pre-Op Bariatric Surgery Diet Handout Protein Shake Handout Post-Op Bariatric Surgery Nutrition Handout BELT Program Information Flyer Support Group Information Flyer [facility] Bariatric Supplements Price List  Follow-Up Plan: Patient will follow-up at [facility] 2 weeks post operatively for diet advancement per MD.

## 2021-08-13 ENCOUNTER — Ambulatory Visit: Payer: Self-pay | Admitting: Surgery

## 2021-08-13 DIAGNOSIS — I10 Essential (primary) hypertension: Secondary | ICD-10-CM

## 2021-08-13 DIAGNOSIS — K760 Fatty (change of) liver, not elsewhere classified: Secondary | ICD-10-CM

## 2021-08-13 DIAGNOSIS — E1165 Type 2 diabetes mellitus with hyperglycemia: Secondary | ICD-10-CM

## 2021-08-13 DIAGNOSIS — G4733 Obstructive sleep apnea (adult) (pediatric): Secondary | ICD-10-CM

## 2021-08-13 DIAGNOSIS — E039 Hypothyroidism, unspecified: Secondary | ICD-10-CM

## 2021-08-13 NOTE — H&P (Signed)
[name] Sherman [id number]   Referring Provider: Self  Subjective   Chief Complaint: Follow-up (Return weight loss bari?)   History of Present Illness: 61 year old woman following up for surgical management of morbid obesity complicated by hypertension, obstructive sleep apnea not currently using CPAP, osteoarthritis and chronic back pain, dyslipidemia, hypothyroidism, type 2 diabetes, migraines, irritable bowel syndrome and chronic abdominal pain. She is on Flexeril and nortriptyline for this. She has completed 6 supervised weight loss appointments with a dietitian and has been approved from that standpoint. She has also undergone extensive work-up with GI, primary care doctor, and hematology oncology for multiple other issues and has not had any barriers identified through this process. Her weight is stable.  CXR/UGI 09/09/2020 negative for any anatomical abnormality including hiatal hernia, but noted large amount of retained gastric contents and no emptying to the pylorus. She has followed up with GI, stopped Ozempic, and had a normal gastric emptying study earlier this month.  Patient called in June 2022 noting rapid weight gain and was advised to meet with her PCP to test for Cushing's or other endocrine issue for her for heart failure/fluid overload. This was following a fairly severe COVID infection in a prolonged period of inactivity related to her back. CT scan completed in December 10, 2020 noted diffuse hepatic steatosis without other acute abdominal pelvic abnormality. Most recent labs in October 2022 noted a mildly elevated alkaline phosphatase at 141, but otherwise unremarkable CMP. White count at that time was 20.5, and she did have an elevated factor VIII activity and fibrinogen level. She met with Dr. Elease Hashimoto in June 2022 and was work-up for Cushing's which was negative. Thyroid function was appropriate. Some of this is thought to be secondary to steroid injections for her orthopedic  issues. She was referred for endocrine consultation. She was also evaluated by Dr. 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   Last visit 08/27/20: 61 year old woman known to me following laparoscopic ventral hernia repair several years ago.  She presents today to discuss surgical management of severe obesity.  She has struggled with this for much of her adult life, worse in the last few years due to several issues with her spine and chronic abdominal pain/nausea.  She has had spine surgery, continues to have significant issues with pain and immobility, is now disabled due to this. She has tried several methods of weight loss including several diet, fasting, exercise programs, Adipex, and is currently on Ozempic, which she felt were some initially but not recently. Her husband noted that she has had significant cravings for sugar and sweet foods for the last few months. She is interested in sleeve gastrectomy, with the primary goal of reducing her weight to alleviate her severe back and abdominal pain, as well as to prevent developing other complications of the disease. She does have a history of reflux which is well controlled with Protonix- no breakthrough symptoms Prior abdominal surgery includes hysterectomy 20 years ago and laparoscopic ventral hernia repair with mesh. 273.4lb/bmi 45.5  She is a candidate for sleeve gastrectomy. We discussed the relevant anatomy using a diagram to demonstrate, and went over the surgery including technical aspects, the risks of bleeding, infection, pain, scarring, injury to intra-abdominal structures, staple line leak or abscess, chronic abdominal pain or nausea, new onset or worsened GERD, DVT/PE, pneumonia, heart attack, stroke, death, failure to reach weight loss goals and weight regain, hernia. Discussed the typical pre-, peri-,  and postoperative course. Discussed the importance of  lifelong behavioral changes to combat the chronic and relapsing disease which is obesity. They have asked her to spend the next week or 2 examining her lifestyle, behaviors, thought patterns and habits, and did choose one behavior which she is going to change and emphasized that she is going to need have a completely different lifestyle after surgery in order to maintain weight loss and health. Will start the pathway for sleeve gastrectomy.  Review of Systems: A complete review of systems was obtained from the patient. I have reviewed this information and discussed as appropriate with the patient. See HPI as well for other ROS.  Medical History: Past Medical History:  Diagnosis Date   Arthritis   Hyperlipidemia   Hypertension   Sleep apnea   Thyroid disease   There is no problem list on file for this patient.  Past Surgical History:  Procedure Laterality Date   HERNIA REPAIR    Allergies  Allergen Reactions   Cephalexin Anaphylaxis and Swelling  Tongue swelling   Current Outpatient Medications on File Prior to Visit  Medication Sig Dispense Refill   atorvastatin (LIPITOR) 10 MG tablet Take 1 tablet by mouth once daily   oxyCODONE-acetaminophen (PERCOCET) 5-325 mg tablet Take by mouth   losartan-hydrochlorothiazide (HYZAAR) 50-12.5 mg tablet Take 1 tablet by mouth once daily   No current facility-administered medications on file prior to visit.   Family History  Problem Relation Age of Onset   Osteoarthritis Father   Obesity Father   High blood pressure (Hypertension) Father   Skin cancer Sister   Obesity Sister    Social History   Tobacco Use  Smoking Status Never  Smokeless Tobacco Never    Social History   Socioeconomic History   Marital status: Married  Tobacco Use   Smoking status: Never   Smokeless tobacco: Never  Substance and Sexual Activity   Alcohol use: Never   Drug use: Never   Objective:   Vitals:  07/16/21 0949  BP: 126/70  Pulse: (!)  116  Resp: 20  Temp: 36.4 C (97.5 F)  SpO2: 98%  Weight: (!) 125.4 kg (276 lb 6.4 oz)  Height: 162.6 cm (_0 )   Body mass index is 47.44 kg/m.  Alert, well-appearing Unlabored respirations  Assessment and Plan:  Diagnoses and all orders for this visit:  Morbid obesity (CMS-HCC)  Essential hypertension  OSA (obstructive sleep apnea)  Osteoarthritis, unspecified osteoarthritis type, unspecified site  Chronic back pain, unspecified back location, unspecified back pain laterality  Chronic abdominal pain, unspecified  Dyslipidemia  Hypothyroidism, unspecified type  Type 2 diabetes mellitus with other specified complication, unspecified whether long term insulin use (CMS-HCC)  Other migraine without status migrainosus, not intractable  Irritable bowel syndrome, unspecified type   Intractable morbid obesity. I believe she remains a good candidate for sleeve gastrectomy. We have previously discussed the surgery including technical aspects, the risks of bleeding, infection, pain, scarring, injury to intra-abdominal structures, staple line leak or abscess, chronic abdominal pain or nausea, worsened GERD, DVT/PE, pneumonia, heart attack, stroke, death, failure to reach weight loss goals and weight regain, hernia. Discussed the typical pre-, peri-, and postoperative course. Discussed the importance of lifelong behavioral changes to combat the chronic and relapsing disease which is obesity. Strongly emphasized to the patient and her husband that surgery is a treatment, not a cure, and there will have to be trade-offs in terms of lifestyle and eating behaviors in order to maintain long-term  success. I do think that any weight she loses will dramatically help with her chronic back pain and other joint issues and she is eager for this as well. We will plan to proceed with sleeve gastrectomy.  BMI 30= 180lb  Anjani Feuerborn Raquel James, MD

## 2021-08-17 ENCOUNTER — Ambulatory Visit (INDEPENDENT_AMBULATORY_CARE_PROVIDER_SITE_OTHER): Payer: Medicare Other | Admitting: Family Medicine

## 2021-08-17 ENCOUNTER — Encounter: Payer: Self-pay | Admitting: Family Medicine

## 2021-08-17 ENCOUNTER — Ambulatory Visit: Payer: Medicare Other | Admitting: Family Medicine

## 2021-08-17 VITALS — BP 138/92 | HR 124 | Temp 97.8°F | Ht 64.0 in | Wt 267.8 lb

## 2021-08-17 DIAGNOSIS — N898 Other specified noninflammatory disorders of vagina: Secondary | ICD-10-CM

## 2021-08-17 DIAGNOSIS — R682 Dry mouth, unspecified: Secondary | ICD-10-CM

## 2021-08-17 DIAGNOSIS — R413 Other amnesia: Secondary | ICD-10-CM

## 2021-08-17 DIAGNOSIS — D72829 Elevated white blood cell count, unspecified: Secondary | ICD-10-CM

## 2021-08-17 DIAGNOSIS — E1165 Type 2 diabetes mellitus with hyperglycemia: Secondary | ICD-10-CM

## 2021-08-17 LAB — CBC WITH DIFFERENTIAL/PLATELET
Basophils Absolute: 0.1 10*3/uL (ref 0.0–0.1)
Basophils Relative: 1.1 % (ref 0.0–3.0)
Eosinophils Absolute: 0.2 10*3/uL (ref 0.0–0.7)
Eosinophils Relative: 3 % (ref 0.0–5.0)
HCT: 40 % (ref 36.0–46.0)
Hemoglobin: 13 g/dL (ref 12.0–15.0)
Lymphocytes Relative: 29.9 % (ref 12.0–46.0)
Lymphs Abs: 2.2 10*3/uL (ref 0.7–4.0)
MCHC: 32.4 g/dL (ref 30.0–36.0)
MCV: 81.2 fl (ref 78.0–100.0)
Monocytes Absolute: 0.6 10*3/uL (ref 0.1–1.0)
Monocytes Relative: 7.8 % (ref 3.0–12.0)
Neutro Abs: 4.3 10*3/uL (ref 1.4–7.7)
Neutrophils Relative %: 58.2 % (ref 43.0–77.0)
Platelets: 280 10*3/uL (ref 150.0–400.0)
RBC: 4.93 Mil/uL (ref 3.87–5.11)
RDW: 14.7 % (ref 11.5–15.5)
WBC: 7.4 10*3/uL (ref 4.0–10.5)

## 2021-08-17 LAB — TSH: TSH: 6.88 u[IU]/mL — ABNORMAL HIGH (ref 0.35–5.50)

## 2021-08-17 LAB — HEMOGLOBIN A1C: Hgb A1c MFr Bld: 12.6 % — ABNORMAL HIGH (ref 4.6–6.5)

## 2021-08-17 LAB — VITAMIN B12: Vitamin B-12: 515 pg/mL (ref 211–911)

## 2021-08-17 MED ORDER — FLUCONAZOLE 150 MG PO TABS: ORAL_TABLET | ORAL | 0 refills | Status: DC

## 2021-08-17 NOTE — Progress Notes (Signed)
Established Patient Office Visit  Subjective:  Patient ID: [name] Sherman, female    DOB: August 08, 1960  Age: 61 y.o. MRN: [id number]  CC:  Chief Complaint  Patient presents with   Vaginal Itching    Patient complains of vaginal itching x2 weeks, states she was seen by her OB/GYN, given medications for a bacterial and yeast infection, Rx for Diflucan and requests another refill today from PCP, surgery was recommended for vaginal cyst    HPI [name] Sherman presents to discuss several concerns and issues as below  Has had some recurrent vaginal itching.  She apparently had some discharge and saw GYN.  She reportedly had bacterial vaginosis and yeast vaginitis and was treated with 1 fluconazole and some type of antibiotic for the bacterial vaginosis.  Her symptoms did improve some but now she has some recurrent itching and would like a refill of fluconazole  She complains of severe dry mouth.  She does take nortriptyline for chronic pain issues but feels like she was having symptoms even prior to then.  She is concerned because she has a sister with Sjogren's syndrome.  She does occasionally have dry eye symptoms as well.  She does have history of elevated blood sugars and does have occasional polyuria.  She has lost some weight recently which she thinks partly is effort driven.  She does complain of extreme fatigue at times.  No recent TSH.  She has history of diabetes and had been doing very well on Ozempic but has been unable to get this filled because of national shortage recently.  Upcoming surgery for vaginal cyst excision.  She also has been scheduled for gastric sleeve surgery and is very excited about that.  Separate concern which is very bothersome is that she states she has had difficulties cognitively recently.  Her good friend is with her today and [employer] for the fact that she has been having tremendous difficulties with things like driving familiar places and forgetting very  common things.  For example, patient states the other night at dinner she asked her husband to pray and apparently they just prayed minutes before.  She does have history of migraines but no daily progressive frequent headaches.  No new headache.  She has had difficulties recently with even common things like day of the week.  She states that sometimes even with effort she has difficulty recalling these things.  She has easy bruising.  She saw hematologist several months ago.  Was noted that her white count was 20,000 then.  No follow-up since then.  No recent fever.  Past Medical History:  Diagnosis Date   ADD (attention deficit disorder)    Anemia    Anxiety    Arthritis    shoulder, right (arthritis, tendonitis, bursitis)   Cancer (HCC)    cervical cancer (conization)    Chronic kidney disease    Complication of anesthesia    COVID-19 07/2019   Depression    Dyspnea 2020   GERD (gastroesophageal reflux disease)    History of cervical cancer    s/p  laser ablation of cervix 1980's   History of kidney stones    Hyperlipidemia    Hypertension    Hypothyroidism    IBS (irritable bowel syndrome)    Irregular heart rate    as a child, has never had any problems    Kidney stones    Left ureteral stone    Migraine    OSA (obstructive sleep apnea)  pt used cpap up until 2013 states lost wt and did not need anymore   PONV (postoperative nausea and vomiting)    Pre-diabetes    Restless legs    Sleep apnea    wears CPAP   Umbilical hernia without obstruction or gangrene    Urgency of urination    Vertigo    Vitamin D deficiency     Past Surgical History:  Procedure Laterality Date   ABDOMINAL HYSTERECTOMY     BACK SURGERY  04/27/2018   and in 2021 (Fusion)   BUNIONECTOMY Right 2004   COLONOSCOPY     CYSTOSCOPY W/ RETROGRADES Bilateral 05/26/2015   Procedure: CYSTOSCOPY WITH RETROGRADE PYELOGRAM;  Surgeon: Festus Aloe, MD;  Location: [facility];   Service: Urology;  Laterality: Bilateral;   CYSTOSCOPY W/ URETERAL STENT REMOVAL Left 05/26/2015   Procedure: CYSTOSCOPY WITH STENT REMOVAL;  Surgeon: Festus Aloe, MD;  Location: [facility];  Service: Urology;  Laterality: Left;   CYSTOSCOPY WITH RETROGRADE PYELOGRAM, URETEROSCOPY AND STENT PLACEMENT Left 05/01/2015   Procedure: CYSTOSCOPY WITH RETROGRADE PYELOGRAM AND STENT PLACEMENT;  Surgeon: Festus Aloe, MD;  Location: [facility];  Service: Urology;  Laterality: Left;   CYSTOSCOPY WITH STENT PLACEMENT Bilateral 05/26/2015   Procedure: CYSTOSCOPY WITH STENT PLACEMENT;  Surgeon: Festus Aloe, MD;  Location: [facility];  Service: Urology;  Laterality: Bilateral;   CYSTOSCOPY WITH URETEROSCOPY Bilateral 05/26/2015   Procedure: CYSTOSCOPY WITH URETEROSCOPY;  Surgeon: Festus Aloe, MD;  Location[facility];  Service: Urology;  Laterality: Bilateral;   CYSTOSCOPY/URETEROSCOPY/HOLMIUM LASER/STENT PLACEMENT Right 06/09/2015   Procedure: CYSTOSCOPY/URETEROSCOPY/STENT PLACEMENT REMOVAL LEFT URETERAL STENT;  Surgeon: Festus Aloe, MD;  Location: [facility];  Service: Urology;  Laterality: Right;   DIAGNOSTIC LAPAROSCOPY     HERNIA REPAIR     HOLMIUM LASER APPLICATION Left 52/84/1324   Procedure: HOLMIUM LASER APPLICATION;  Surgeon: Festus Aloe, MD;  Location: [facility];  Service: Urology;  Laterality: Left;   HOLMIUM LASER APPLICATION Right 40/03/2724   Procedure: HOLMIUM LASER APPLICATION;  Surgeon: Festus Aloe, MD;  Location: [facility];  Service: Urology;  Laterality: Right;   INSERTION OF MESH N/A 09/05/2016   Procedure: INSERTION OF MESH;  Surgeon: [name], MD;  Location: [place];  Service: General;  Laterality: N/A;   LAPAROSCOPIC ASSISTED VAGINAL HYSTERECTOMY  04-23-2002   and Left Salpingoophorectomy/  Anterior Repair/  Tension Free Tape Sling placement   LASER ABLATION OF THE CERVIX  x2  1980's   TUBAL  LIGATION  1980's   VENTRAL HERNIA REPAIR N/A 09/05/2016   Procedure: LAPAROSCOPIC VENTRAL HERNIA;  Surgeon: [name], MD;  Location: [place];  Service: General;  Laterality: N/A;    Family History  Problem Relation Age of Onset   Alcohol abuse Mother    Lung cancer Mother    Colon cancer Mother    Arthritis Father    Hyperlipidemia Father    Heart disease Father    Hypertension Father    COPD Father    Kidney disease Paternal Uncle    Cancer Maternal Grandmother        colon   Colon cancer Maternal Grandmother    Rectal cancer Maternal Grandmother    Cancer Paternal Grandmother        breast   Kidney cancer Sister        lung cancer   Cirrhosis Sister    Esophageal cancer Neg Hx    Stomach cancer Neg Hx  Social History   Socioeconomic History   Marital status: Legally Separated    Spouse name: Not on file   Number of children: Not on file   Years of education: Not on file   Highest education level: Not on file  Occupational History   Not on file  Tobacco Use   Smoking status: Never   Smokeless tobacco: Never  Vaping Use   Vaping Use: Never used  Substance and Sexual Activity   Alcohol use: Never    Comment: 1 x year   Drug use: Never   Sexual activity: Yes  Other Topics Concern   Not on file  Social History Narrative   Not on file   Social Determinants of Health   Financial Resource Strain: Low Risk    Difficulty of Paying Living Expenses: Not hard at all  Food Insecurity: No Food Insecurity   Worried About Charity fundraiser in the Last Year: Never true   [place] in the Last Year: Never true  Transportation Needs: No Transportation Needs   Lack of Transportation (Medical): No   Lack of Transportation (Non-Medical): No  Physical Activity: Sufficiently Active   Days of Exercise per Week: 7 days   Minutes of Exercise per Session: 30 min  Stress: No Stress Concern Present   Feeling of Stress : Only a little  Social Connections: Not on  file  Intimate Partner Violence: Not on file    Outpatient Medications Prior to Visit  Medication Sig Dispense Refill   Accu-Chek Softclix Lancets lancets Use as instructed 100 each 12   amphetamine-dextroamphetamine (ADDERALL) 30 MG tablet Take one tablet twice daily. 60 tablet 0   atorvastatin (LIPITOR) 10 MG tablet TAKE 1 TABLET BY MOUTH DAILY 90 tablet 2   Biotin 1000 MCG tablet Take 1,000 mcg by mouth 3 (three) times daily.     blood glucose meter kit and supplies Dispense based on patient and insurance preference. Use up to four times daily as directed. (FOR ICD-10 E10.9, E11.9). 1 each 0   citalopram (CELEXA) 20 MG tablet Take 1 tablet (20 mg total) by mouth daily. 90 tablet 3   cyclobenzaprine (FLEXERIL) 10 MG tablet Take 10 mg by mouth 2 (two) times daily.     furosemide (LASIX) 20 MG tablet TAKE 1 TABLET BY MOUTH ONCE A DAY AS NEEDED FOR SWELLING 30 tablet 1   glucose blood test strip Use as instructed 100 each 12   losartan-hydrochlorothiazide (HYZAAR) 100-12.5 MG tablet TAKE 1 TABLET BY MOUTH EVERY DAY 90 tablet 1   nortriptyline (PAMELOR) 25 MG capsule Take by mouth.     nystatin cream (MYCOSTATIN) Apply 1 application topically 2 (two) times daily. 30 g 1   ondansetron (ZOFRAN) 8 MG tablet Take 1 tablet (8 mg total) by mouth every 8 (eight) hours as needed for nausea or vomiting. 60 tablet 1   oxyCODONE-acetaminophen (PERCOCET/ROXICET) 5-325 MG tablet Take 1-2 tablets by mouth every 4 (four) hours as needed for moderate pain or severe pain. 60 tablet 0   pantoprazole (PROTONIX) 40 MG tablet TAKE 1 TABLET(40 MG) BY MOUTH TWICE DAILY 180 tablet 1   Probiotic Product (ALIGN PO) Take 1 capsule by mouth daily.     Semaglutide,0.25 or 0.5MG /DOS, (OZEMPIC, 0.25 OR 0.5 MG/DOSE,) 2 MG/1.5ML SOPN Inject 0.25 mg subcutaneous once weekly for 4 weeks and then titrate up to 0.5 mg subcutaneous once weekly 1.5 mL 2   SUMAtriptan (IMITREX) 100 MG tablet May repeat in 2  hours if headache persists  or recurs.  Do not take more than 2 in 24 hours (Patient taking differently: Take 100 mg by mouth every 2 (two) hours as needed for migraine. May repeat in 2 hours if headache persists or recurs.  Do not take more than 2 in 24 hours) 10 tablet 11   SYNTHROID 50 MCG tablet TAKE 1 TABLET BY MOUTH EVERY DAY 90 tablet 0   Turmeric-Ginger 135-6 MG CHEW Chew 2 each by mouth daily.     Vitamin D, Ergocalciferol, (DRISDOL) 1.25 MG (50000 UNIT) CAPS capsule TAKE 1 CAPSULE BY MOUTH 1 TIME A WEEK (Patient taking differently: Take 50,000 Units by mouth every Monday. TAKE 1 CAPSULE BY MOUTH 1 TIME A WEEK) 12 capsule 3   No facility-administered medications prior to visit.    Allergies  Allergen Reactions   Keflex [Cephalexin] Swelling    Tongue swelling    ROS Review of Systems  Constitutional:  Positive for fatigue.  Respiratory:  Negative for cough.   Cardiovascular:  Negative for chest pain.  Genitourinary:  Negative for dysuria.  Neurological:  Negative for dizziness, tremors, seizures, syncope, speech difficulty, weakness and headaches.  Psychiatric/Behavioral:  Positive for confusion. Negative for agitation, behavioral problems, dysphoric mood and hallucinations.      Objective:    Physical Exam Vitals reviewed.  HENT:     Mouth/Throat:     Comments: Does have very dry mouth. Cardiovascular:     Rate and Rhythm: Normal rate.  Pulmonary:     Effort: Pulmonary effort is normal.     Breath sounds: Normal breath sounds.  Neurological:     General: No focal deficit present.     Cranial Nerves: No cranial nerve deficit.     Comments: No focal weakness    BP (!) 138/92 (BP Location: Left Arm, Patient Position: Sitting, Cuff Size: Large)    Pulse (!) 124    Temp 97.8 F (36.6 C) (Oral)    Ht $R'5\' 4"'Jp$  (1.626 m)    Wt 267 lb 12.8 oz (121.5 kg)    SpO2 97%    BMI 45.97 kg/m  Wt Readings from Last 3 Encounters:  08/17/21 267 lb 12.8 oz (121.5 kg)  08/09/21 267 lb 8 oz (121.3 kg)  06/30/21  274 lb 3.2 oz (124.4 kg)     Health Maintenance Due  Topic Date Due   FOOT EXAM  Never done   INFLUENZA VACCINE  01/25/2021   HEMOGLOBIN A1C  04/03/2021   Zoster Vaccines- Shingrix (2 of 2) 05/11/2021    There are no preventive care reminders to display for this patient.  Lab Results  Component Value Date   TSH 2.45 12/07/2020   Lab Results  Component Value Date   WBC 20.5 (H) 04/08/2021   HGB 12.4 04/08/2021   HCT 38.8 04/08/2021   MCV 79.0 (L) 04/08/2021   PLT 461 (H) 04/08/2021   Lab Results  Component Value Date   NA 135 04/08/2021   K 3.9 04/08/2021   CO2 26 04/08/2021   GLUCOSE 152 (H) 04/08/2021   BUN 19 04/08/2021   CREATININE 0.90 04/08/2021   BILITOT 0.5 04/08/2021   ALKPHOS 141 (H) 04/08/2021   AST 24 04/08/2021   ALT 31 04/08/2021   PROT 8.2 (H) 04/08/2021   ALBUMIN 3.6 04/08/2021   CALCIUM 9.5 04/08/2021   ANIONGAP 7 04/08/2021   GFR 74.60 12/07/2020   Lab Results  Component Value Date   CHOL 156 03/23/2020   Lab  Results  Component Value Date   HDL 45 (L) 03/23/2020   Lab Results  Component Value Date   LDLCALC 86 03/23/2020   Lab Results  Component Value Date   TRIG 155 (H) 03/23/2020   Lab Results  Component Value Date   CHOLHDL 3.5 03/23/2020   Lab Results  Component Value Date   HGBA1C 6.9 (H) 10/02/2020      Assessment & Plan:   #1 dry mouth symptoms.  Obviously, nortriptyline could be contributing somewhat.  She does have family history of Sjogren's and a sister.  We will check Sjogren's antibodies.  If negative and symptoms continue to be extremely bothersome may need to look at discontinuation of nortriptyline.  Hopefully, these symptoms do not reflect worsening diabetes control.  #2 recurrent vaginitis symptoms.  Patient requesting refill fluconazole.  We agreed to send in fluconazole 150 mg x 1 and repeat 1 in 3 days  #3 type 2 diabetes.  Overdue for repeat A1c.  Unfortunately, she has not been on her Ozempic recently.   We will recheck A1c today.  #4 concern for memory deficit and recent cognitive changes.  Both she and her friend express significant concerns that there are definite changes.  She had difficulties as outlined above even with things like driving common places  -Check B12 and TSH -Set up MRI brain to further assess -Pending on above consider further neurocognitive assessment   Meds ordered this encounter  Medications   fluconazole (DIFLUCAN) 150 MG tablet    Sig: Take one tablet by mouth and then repeat one tablet in 3 days.    Dispense:  2 tablet    Refill:  0    Follow-up: No follow-ups on file.    Carolann Littler, MD

## 2021-08-18 ENCOUNTER — Ambulatory Visit: Payer: Medicare Other | Admitting: Family Medicine

## 2021-08-18 ENCOUNTER — Encounter: Payer: Self-pay | Admitting: Family Medicine

## 2021-08-18 LAB — SJOGREN'S SYNDROME ANTIBODS(SSA + SSB)
SSA (Ro) (ENA) Antibody, IgG: <1 AI
SSB (La) (ENA) Antibody, IgG: <1 AI

## 2021-08-18 MED ORDER — METFORMIN HCL 500 MG PO TABS: 500.0000 mg | ORAL_TABLET | Freq: Two times a day (BID) | ORAL | 0 refills | Status: DC

## 2021-08-18 NOTE — Addendum Note (Signed)
Addended by: Nilda Riggs on: 08/18/2021 09:05 AM   Modules accepted: Orders

## 2021-08-19 NOTE — H&P (Unsigned)
NAMEMERCEDES, [name] Sherman MEDICAL RECORD NO: [id number] ACCOUNT NO: [id number] DATE OF BIRTH: 10-08-1960 PHYSICIAN: Ralene Bathe. Matthew Saras, MD  History and Physical   DATE OF ADMISSION: 08/30/2021  Surgery date at [facility] is 08/30/2021.  CHIEF COMPLAINT:  Chronically irritated vulvar cyst.  HISTORY OF PRESENT ILLNESS:  A 61 year old married G3 P2, has noted a chronically irritated vulvar probable sebaceous cyst that has not responded to antibiotics, nor is it completely resolved.  This was marked and she presents at this time for excision  under local and sedation.  This procedure reviewed including risks related to bleeding, infection, complications, may require additional surgery, all discussed with her, which she understands and accepts.  PAST MEDICAL HISTORY: ALLERGIES:  None.  MEDICATIONS:  Atorvastatin, citalopram, cyclobenzaprine p.r.n., Adderall, fluticasone nasal spray, Lasix 20 mg p.r.n., losartan/HCTZ, nortriptyline 50 mg b.i.d., Zofran p.r.n., Ozempic, pantoprazole 40 mg, Synthroid 50 mcg, sumatriptan p.r.n.  FAMILY HISTORY:  Significant for sister, mother with lung cancer, two aunts have had breast cancer.  Grandmother and mother both had colon cancer.  Father with hypertension.  Sister who has had ovarian cancer and cirrhosis of the liver.  SOCIAL HISTORY:  She is married.  Does not use tobacco.  Declines using alcohol.  PAST SURGICAL HISTORY:  Prior lithotripsy, bunionectomy, hysterectomy, tubal, hernia repair.  REVIEW OF SYSTEMS:  Significant for history of ADHD, anxiety, GERD, dyslipidemia, hypertension, hypothyroidism, migraine headache, sleep apnea.  PHYSICAL EXAMINATION:   VITAL SIGNS:  Weight 269, BMI of 46.2, BP 124/84. HEENT:  Unremarkable. NECK:  Supple, without mass. LUNGS:  Clear. CARDIOVASCULAR:  Regular rate and rhythm without murmurs, rubs or gallops. BREASTS:  Without masses. ABDOMEN:  Soft, flat, nontender. PELVIC:  On vulvar exam, the area of the  sebaceous cyst on the vulva has been marked for excision.  Exam otherwise negative.  Vaginal cuff normal.  Bimanual otherwise negative. NEUROLOGIC:  Unremarkable.  IMPRESSION:  Chronically irritated vulvar cyst.  PLAN:  Excision, procedure and risks discussed as above.   PUS D: 08/18/2021 8:36:05 am T: 08/18/2021 10:40:00 am  JOB: [id number]/ [id number]

## 2021-08-23 ENCOUNTER — Encounter (HOSPITAL_BASED_OUTPATIENT_CLINIC_OR_DEPARTMENT_OTHER): Payer: Self-pay | Admitting: Obstetrics and Gynecology

## 2021-08-24 ENCOUNTER — Encounter (HOSPITAL_BASED_OUTPATIENT_CLINIC_OR_DEPARTMENT_OTHER): Payer: Self-pay | Admitting: Obstetrics and Gynecology

## 2021-08-24 NOTE — Patient Instructions (Signed)
DUE TO COVID-19 ONLY ONE VISITOR IS ALLOWED TO COME WITH YOU AND STAY IN THE WAITING ROOM ONLY DURING PRE OP AND PROCEDURE.   **NO VISITORS ARE ALLOWED IN THE SHORT STAY AREA OR RECOVERY ROOM!!**  IF YOU WILL BE ADMITTED INTO THE HOSPITAL YOU ARE ALLOWED ONLY TWO SUPPORT PEOPLE DURING VISITATION HOURS ONLY (7 AM -8PM)   The support person(s) must pass our screening, gel in and out, and wear a mask at all times, including in the patients room. Patients must also wear a mask when staff or their support person are in the room. Visitors GUEST BADGE MUST BE WORN VISIBLY  One adult visitor may remain with you overnight and MUST be in the room by 8 P.M.  No visitors under the age of 14. Any visitor under the age of 76 must be accompanied by an adult.    COVID SWAB TESTING MUST BE COMPLETED ON:  09/03/21   Site: [facility] [place] [place] Enter: Main Entrance have a seat in the waiting area to the right of main entrance (DO NOT [place]!!!!!) Dial: [phone] to alert staff you have arrived  You are not required to quarantine, however you are required to wear a well-fitted mask when you are out and around people not in your household.  Hand Hygiene often Do NOT share personal items Notify your provider if you are in close contact with someone who has COVID or you develop fever 100.4 or greater, new onset of sneezing, cough, sore throat, shortness of breath or body aches.  [place], [address], must go inside of the hospital, NOT A DRIVE THRU!  (Must self quarantine after testing. Follow instructions on handout.)       Your procedure is scheduled on: 09/07/21   Report to [facility] Main Entrance    Report to admitting at : 9:00 AM   Call this number if you have problems the morning of surgery [phone] Eat a light diet the day before surgery.  Examples including soups,  broths, toast, yogurt, mashed potatoes.  Things to avoid include carbonated beverages (fizzy beverages), raw fruits and raw vegetables, or beans.  If your bowels are filled with gas, your surgeon will have difficulty visualizing your pelvic organs which increases your surgical risks.   MORNING OF SURGERY DRINK: [place] 1 G2 drink BEFORE YOU LEAVE HOME( AT: 8:00 AM), DRINK ALL OF THE  G2 DRINK AT ONE TIME.   NO SOLID FOOD AFTER 600 PM THE NIGHT BEFORE YOUR SURGERY. YOU MAY DRINK CLEAR FLUIDS. THE G2 DRINK YOU DRINK BEFORE YOU LEAVE HOME WILL BE THE LAST FLUIDS YOU DRINK BEFORE SURGERY. Water Black Coffee (sugar ok, NO MILK/CREAM OR CREAMERS)  Tea (sugar ok, NO MILK/CREAM OR CREAMERS) regular and decaf                             Plain Jell-O (NO RED)                                           Fruit ices (not with fruit pulp, NO RED)  Popsicles (NO RED)                                                                  Juice: apple, WHITE grape, WHITE cranberry Sports drinks like Gatorade (NO RED) Clear broth(vegetable,chicken,beef)     The day of surgery:  Drink ONE (1) Pre-Surgery Clear Ensure or G2 at AM the morning of surgery. Drink in one sitting. Do not sip.  This drink was given to you during your hospital  pre-op appointment visit. Nothing else to drink after completing the  Pre-Surgery Clear Ensure or G2.          If you have questions, please contact your surgeons office.  PAIN IS EXPECTED AFTER SURGERY AND WILL NOT BE COMPLETELY ELIMINATED. AMBULATION AND TYLENOL WILL HELP REDUCE INCISIONAL AND GAS PAIN. MOVEMENT IS KEY!  YOU ARE EXPECTED TO BE OUT OF BED WITHIN 4 HOURS OF ADMISSION TO YOUR PATIENT ROOM.  SITTING IN THE RECLINER THROUGHOUT THE DAY IS IMPORTANT FOR DRINKING FLUIDS AND MOVING GAS THROUGHOUT THE GI TRACT.  COMPRESSION STOCKINGS SHOULD BE WORN [place] UNLESS YOU ARE WALKING.   INCENTIVE SPIROMETER SHOULD BE  USED EVERY HOUR WHILE AWAKE TO DECREASE POST-OPERATIVE COMPLICATIONS SUCH AS PNEUMONIA.  WHEN DISCHARGED HOME, IT IS IMPORTANT TO CONTINUE TO WALK EVERY HOUR AND USE THE INCENTIVE SPIROMETER EVERY HOUR.  FOLLOW BOWEL PREP AND ANY ADDITIONAL PRE OP INSTRUCTIONS YOU RECEIVED FROM YOUR SURGEON'S OFFICE!!!  Oral Hygiene is also important to reduce your risk of infection.                                    Remember - BRUSH YOUR TEETH THE MORNING OF SURGERY WITH YOUR REGULAR TOOTHPASTE   Do NOT smoke after Midnight   Take these medicines the morning of surgery with A SIP OF WATER: citalopram,synthroid,pantoprazole,nortriptyline.Sumatriptan as needed. How to Manage Your Diabetes Before and After Surgery  Why is it important to control my blood sugar before and after surgery? Improving blood sugar levels before and after surgery helps healing and can limit problems. A way of improving blood sugar control is eating a healthy diet by:  Eating less sugar and carbohydrates  Increasing activity/exercise  Talking with your doctor about reaching your blood sugar goals High blood sugars (greater than 180 mg/dL) can raise your risk of infections and slow your recovery, so you will need to focus on controlling your diabetes during the weeks before surgery. Make sure that the doctor who takes care of your diabetes knows about your planned surgery including the date and location.  How do I manage my blood sugar before surgery? Check your blood sugar at least 4 times a day, starting 2 days before surgery, to make sure that the level is not too high or low. Check your blood sugar the morning of your surgery when you wake up and every 2 hours until you get to the Short Stay unit. If your blood sugar is less than 70 mg/dL, you will need to treat for low blood sugar: Do not take insulin. Treat a low blood sugar (less than 70 mg/dL) with  cup of clear juice (cranberry or apple), 4 glucose  tablets, OR glucose  gel. Recheck blood sugar in 15 minutes after treatment (to make sure it is greater than 70 mg/dL). If your blood sugar is not greater than 70 mg/dL on recheck, call [phone] for further instructions. Report your blood sugar to the short stay nurse when you get to Short Stay.  If you are admitted to the hospital after surgery: Your blood sugar will be checked by the staff and you will probably be given insulin after surgery (instead of oral diabetes medicines) to make sure you have good blood sugar levels. The goal for blood sugar control after surgery is 80-180 mg/dL.  WHAT DO I DO ABOUT MY DIABETES MEDICATION?  Do not take oral diabetes medicines (pills) the morning of surgery.  THE DAY BEFORE SURGERY, take metformin as usual.     THE MORNING OF SURGERY, DO NOT TAKE ANY ORAL DIABETIC MEDICATIONS DAY OF YOUR SURGERY  The day of surgery, do not take other diabetes injectables, including Byetta (exenatide), Bydureon (exenatide ER), Victoza (liraglutide), or Trulicity (dulaglutide)                        You may not have any metal on your body including hair pins, jewelry, and body piercing             Do not wear make-up, lotions, powders, perfumes/cologne, or deodorant  Do not wear nail polish including gel and S&S, artificial/acrylic nails, or any other type of covering on natural nails including finger and toenails. If you have artificial nails, gel coating, etc. that needs to be removed by a nail salon please have this removed prior to surgery or surgery may need to be canceled/ delayed if the surgeon/ anesthesia feels like they are unable to be safely monitored.   Do not shave  48 hours prior to surgery.    Do not bring valuables to the hospital. [place].   Contacts, dentures or bridgework may not be worn into surgery.   Bring small overnight bag day of surgery.    Patients discharged on the day of surgery will not be allowed  to drive home.  Someone NEEDS to stay with you for the first 24 hours after anesthesia.   Special Instructions: Bring a copy of your healthcare power of attorney and living will documents         the day of surgery if you haven't scanned them before.              Please read over the following fact sheets you were given: IF YOU HAVE QUESTIONS ABOUT YOUR PRE-OP INSTRUCTIONS PLEASE CALL [phone]     [facility] - Preparing for Surgery Before surgery, you can play an important role.  Because skin is not sterile, your skin needs to be as free of germs as possible.  You can reduce the number of germs on your skin by washing with CHG (chlorahexidine gluconate) soap before surgery.  CHG is an antiseptic cleaner which kills germs and bonds with the skin to continue killing germs even after washing. Please DO NOT use if you have an allergy to CHG or antibacterial soaps.  If your skin becomes reddened/irritated stop using the CHG and inform your nurse when you arrive at Short Stay. Do not shave (including legs and underarms) for at least 48 hours prior to the first CHG shower.  You may shave your face/neck. Please follow these instructions carefully:  1.  Shower with CHG Soap the night before surgery and the  morning of Surgery.  2.  If you choose to wash your hair, wash your hair first as usual with your  normal  shampoo.  3.  After you shampoo, rinse your hair and body thoroughly to remove the  shampoo.                           4.  Use CHG as you would any other liquid soap.  You can apply chg directly  to the skin and wash                       Gently with a scrungie or clean washcloth.  5.  Apply the CHG Soap to your body ONLY FROM THE NECK DOWN.   Do not use on face/ open                           Wound or open sores. Avoid contact with eyes, ears mouth and genitals (private parts).                       Wash face,  Genitals (private parts) with your normal soap.             6.  Wash thoroughly,  paying special attention to the area where your surgery  will be performed.  7.  Thoroughly rinse your body with warm water from the neck down.  8.  DO NOT shower/wash with your normal soap after using and rinsing off  the CHG Soap.                9.  Pat yourself dry with a clean towel.            10.  Wear clean pajamas.            11.  Place clean sheets on your bed the night of your first shower and do not  sleep with pets. Day of Surgery : Do not apply any lotions/deodorants the morning of surgery.  Please wear clean clothes to the hospital/surgery center.  FAILURE TO FOLLOW THESE INSTRUCTIONS MAY RESULT IN THE CANCELLATION OF YOUR SURGERY PATIENT SIGNATURE_________________________________  NURSE SIGNATURE__________________________________  ________________________________________________________________________

## 2021-08-25 ENCOUNTER — Encounter (HOSPITAL_COMMUNITY): Payer: Self-pay

## 2021-08-25 ENCOUNTER — Other Ambulatory Visit: Payer: Self-pay

## 2021-08-25 ENCOUNTER — Ambulatory Visit: Payer: Medicare Other | Admitting: Family Medicine

## 2021-08-25 ENCOUNTER — Encounter (HOSPITAL_COMMUNITY)
Admission: RE | Admit: 2021-08-25 | Discharge: 2021-08-25 | Disposition: A | Discharge status: Home / Self Care | Payer: Medicare Other | Source: Ambulatory Visit | Attending: Obstetrics and Gynecology | Admitting: Obstetrics and Gynecology

## 2021-08-25 VITALS — BP 132/68 | HR 127 | Temp 99.3°F | Resp 16 | Ht 65.0 in | Wt 264.0 lb

## 2021-08-25 DIAGNOSIS — E039 Hypothyroidism, unspecified: Secondary | ICD-10-CM | POA: Diagnosis not present

## 2021-08-25 DIAGNOSIS — E1165 Type 2 diabetes mellitus with hyperglycemia: Secondary | ICD-10-CM | POA: Diagnosis not present

## 2021-08-25 DIAGNOSIS — G4733 Obstructive sleep apnea (adult) (pediatric): Secondary | ICD-10-CM | POA: Diagnosis not present

## 2021-08-25 DIAGNOSIS — I10 Essential (primary) hypertension: Secondary | ICD-10-CM

## 2021-08-25 DIAGNOSIS — K760 Fatty (change of) liver, not elsewhere classified: Secondary | ICD-10-CM | POA: Diagnosis not present

## 2021-08-25 DIAGNOSIS — Z01818 Encounter for other preprocedural examination: Secondary | ICD-10-CM | POA: Diagnosis not present

## 2021-08-25 HISTORY — DX: Fatty (change of) liver, not elsewhere classified: K76.0

## 2021-08-25 LAB — COMPREHENSIVE METABOLIC PANEL
ALT: 73 U/L — ABNORMAL HIGH (ref 0–44)
AST: 76 U/L — ABNORMAL HIGH (ref 15–41)
Albumin: 3.6 g/dL (ref 3.5–5.0)
Alkaline Phosphatase: 163 U/L — ABNORMAL HIGH (ref 38–126)
Anion gap: 10 (ref 5–15)
BUN: 12 mg/dL (ref 6–20)
CO2: 24 mmol/L (ref 22–32)
Calcium: 9.1 mg/dL (ref 8.9–10.3)
Chloride: 98 mmol/L (ref 98–111)
Creatinine, Ser: 0.91 mg/dL (ref 0.44–1.00)
GFR, Estimated: >60 mL/min (ref >60)
Glucose, Bld: 354 mg/dL — ABNORMAL HIGH (ref 70–99)
Potassium: 3.9 mmol/L (ref 3.5–5.1)
Sodium: 132 mmol/L — ABNORMAL LOW (ref 135–145)
Total Bilirubin: 0.3 mg/dL (ref 0.3–1.2)
Total Protein: 7.5 g/dL (ref 6.5–8.1)

## 2021-08-25 LAB — CBC WITH DIFFERENTIAL/PLATELET
Abs Immature Granulocytes: 0.03 10*3/uL (ref 0.00–0.07)
Basophils Absolute: 0.2 10*3/uL — ABNORMAL HIGH (ref 0.0–0.1)
Basophils Relative: 2 %
Eosinophils Absolute: 0.3 10*3/uL (ref 0.0–0.5)
Eosinophils Relative: 3 %
HCT: 44.2 % (ref 36.0–46.0)
Hemoglobin: 14.2 g/dL (ref 12.0–15.0)
Immature Granulocytes: 0 %
Lymphocytes Relative: 33 %
Lymphs Abs: 3.2 10*3/uL (ref 0.7–4.0)
MCH: 26.8 pg (ref 26.0–34.0)
MCHC: 32.1 g/dL (ref 30.0–36.0)
MCV: 83.4 fL (ref 80.0–100.0)
Monocytes Absolute: 0.7 10*3/uL (ref 0.1–1.0)
Monocytes Relative: 8 %
Neutro Abs: 5.4 10*3/uL (ref 1.7–7.7)
Neutrophils Relative %: 54 %
Platelets: 336 10*3/uL (ref 150–400)
RBC: 5.3 MIL/uL — ABNORMAL HIGH (ref 3.87–5.11)
RDW: 13.8 % (ref 11.5–15.5)
WBC: 9.8 10*3/uL (ref 4.0–10.5)
nRBC: 0 % (ref 0.0–0.2)

## 2021-08-25 LAB — GLUCOSE, CAPILLARY: Glucose-Capillary: 348 mg/dL — ABNORMAL HIGH (ref 70–99)

## 2021-08-25 NOTE — Progress Notes (Signed)
Lab results: CBG: 348. Glucose blood: 354 ?

## 2021-08-25 NOTE — Progress Notes (Addendum)
For Short Stay: ?COVID SWAB appointment date: 09/03/21 @ 12:00 PM ?Date of COVID positive in last 90 days: N/A ?COVID Vaccine: Pfizer x 4: 04/12/21 ?Bowel Prep reminder: N/A ? ? ?For Anesthesia: ?PCP - Dr. Carolann Littler. ?Cardiologist -  ? ?Chest x-ray - 09/09/20 ?EKG -  ?Stress Test -  ?ECHO -  ?Cardiac Cath -  ?Pacemaker/ICD device last checked: ?Pacemaker orders received: ?Device Rep notified: ? ?Spinal Cord Stimulator: ? ?Sleep Study - Yes ?CPAP - NO ? ?Fasting Blood Sugar - N/A ?Checks Blood Sugar ___0__ times a day ?Date and result of last Hgb A1c- 08/17/21: 12.6 ? ?Blood Thinner Instructions: ?Aspirin Instructions: ?Last Dose: ? ?Activity level: Can go up a flight of stairs and activities of daily living without stopping and without chest pain and/or shortness of breath ?  Able to exercise without chest pain and/or shortness of breath ?  Unable to go up a flight of stairs without chest pain and/or shortness of breath ?   ? ?Anesthesia review: Hx: DIA,HTN,OSA(NO CPAP) ? ?Patient denies shortness of breath, fever, cough and chest pain at PAT appointment ? ? ?Patient verbalized understanding of instructions that were given to them at the PAT appointment. Patient was also instructed that they will need to review over the PAT instructions again at home before surgery.  ?

## 2021-08-26 ENCOUNTER — Other Ambulatory Visit: Payer: Self-pay | Admitting: Family Medicine

## 2021-08-26 ENCOUNTER — Encounter (HOSPITAL_COMMUNITY): Payer: Self-pay | Admitting: Physician Assistant

## 2021-08-26 MED ORDER — AMPHETAMINE-DEXTROAMPHETAMINE 30 MG PO TABS: ORAL_TABLET | ORAL | 0 refills | Status: DC

## 2021-08-26 NOTE — Telephone Encounter (Signed)
I refilled the Adderall but she really needs diabetes follow up soon.    She had appt yesterday but did not show.   ?

## 2021-08-26 NOTE — Telephone Encounter (Signed)
Last refill-07/26/2021 ?Next OV- 08/27/2021 ? ?Can this patient receive a refill? ?

## 2021-08-26 NOTE — Progress Notes (Signed)
Left message with [name] Sherman at dr holland office blood glucose 354 on labs 08-25-2021 and last hemaglobin A1C 12.6 on 08-17-2021 . Left message for [name] to return call with any instructions from dr holland. ?

## 2021-08-27 ENCOUNTER — Ambulatory Visit (INDEPENDENT_AMBULATORY_CARE_PROVIDER_SITE_OTHER): Payer: Medicare Other | Admitting: Family Medicine

## 2021-08-27 ENCOUNTER — Encounter (HOSPITAL_BASED_OUTPATIENT_CLINIC_OR_DEPARTMENT_OTHER): Payer: Self-pay | Admitting: Obstetrics and Gynecology

## 2021-08-27 ENCOUNTER — Other Ambulatory Visit: Payer: Self-pay | Admitting: Family Medicine

## 2021-08-27 ENCOUNTER — Encounter: Payer: Self-pay | Admitting: Family Medicine

## 2021-08-27 VITALS — BP 132/80 | HR 120 | Temp 97.7°F | Ht 65.0 in | Wt 264.5 lb

## 2021-08-27 DIAGNOSIS — E1165 Type 2 diabetes mellitus with hyperglycemia: Secondary | ICD-10-CM | POA: Diagnosis not present

## 2021-08-27 LAB — GLUCOSE, POCT (MANUAL RESULT ENTRY): POC Glucose: 296 mg/dl — AB (ref 70–99)

## 2021-08-27 MED ORDER — INSULIN LISPRO (1 UNIT DIAL) 100 UNIT/ML (KWIKPEN): 5.0000 [IU] | PEN_INJECTOR | Freq: Three times a day (TID) | SUBCUTANEOUS | 5 refills | Status: DC

## 2021-08-27 MED ORDER — BLOOD GLUCOSE METER KIT: PACK | 0 refills | Status: DC

## 2021-08-27 MED ORDER — ACCU-CHEK GUIDE VI STRP: ORAL_STRIP | 3 refills | Status: DC

## 2021-08-27 MED ORDER — NOVOLOG FLEXPEN 100 UNIT/ML ~~LOC~~ SOPN: 5.0000 [IU] | PEN_INJECTOR | Freq: Three times a day (TID) | SUBCUTANEOUS | 5 refills | Status: DC

## 2021-08-27 NOTE — Patient Instructions (Signed)
Start the Tresiba 10 units [place] once daily ? ?Start the Novolog 5 units three times daily with meals.   ? ?Give me some feedback regarding readings early next week. ?

## 2021-08-27 NOTE — Progress Notes (Signed)
Established Patient Office Visit  Subjective:  Patient ID: [name] Sherman, female    DOB: 11/06/1960  Age: 61 y.o. MRN: [id number]  CC:  Chief Complaint  Patient presents with   Follow-up    diabetes    HPI [name] Sherman presents for follow-up regarding diabetes.  She came in recently with dry mouth symptoms and was concerned about Sjogren's syndrome because of relative with that.  Her Sjogren antibodies were normal.  However, her A1c came back 12.6.  She had recent labs done couple days ago with glucose 354.  She is fasting this morning.  She had been on Ozempic previously and was well controlled but had to come off this because of availability.  Also, she is planning to have gastric bypass surgery 14 March and would have to come off of GLP-1 medication anyway.  She has had history of recurrent UTIs and recurrent yeast vaginitis in the past not a great candidate for SGLT2 class.  Obviously, sulfonylureas would not be a great class with her history of weight gain and desire to lose weight.  Fasting glucose this morning 296.  We did start metformin as she is tolerating fairly well.  She has less dry mouth.  She is actually scheduled for GYN procedure Monday morning and then the gastric bypass surgery the 14th.  Denies any recent fever chills  Past Medical History:  Diagnosis Date   ADD (attention deficit disorder)    Anemia    Anxiety    Arthritis    shoulder, right (arthritis, tendonitis, bursitis)   Cancer (HCC)    Chronic low back pain    Complication of anesthesia    Dyspnea    Fatty liver    GERD (gastroesophageal reflux disease)    History of cervical cancer    s/p  laser ablation of cervix 1980's   History of COVID-19 07/2019   History of kidney stones    Hyperlipidemia    Hypertension    Hypothyroidism    IBS (irritable bowel syndrome)    MDD (major depressive disorder)    Migraine    OSA (obstructive sleep apnea)    pt used cpap up until 2013 states lost wt and  did not need anymore   PONV (postoperative nausea and vomiting)    Restless legs    Sleep apnea    wears CPAP   Type 2 diabetes mellitus ([place])    Urgency of urination    Vertigo    Vitamin D deficiency    Vulvar cyst     Past Surgical History:  Procedure Laterality Date   BUNIONECTOMY Right 2004   COLONOSCOPY     CYSTOSCOPY W/ RETROGRADES Bilateral 05/26/2015   Procedure: CYSTOSCOPY WITH RETROGRADE PYELOGRAM;  Surgeon: Festus Aloe, MD;  Location: [facility];  Service: Urology;  Laterality: Bilateral;   CYSTOSCOPY W/ URETERAL STENT REMOVAL Left 05/26/2015   Procedure: CYSTOSCOPY WITH STENT REMOVAL;  Surgeon: Festus Aloe, MD;  Location: [facility];  Service: Urology;  Laterality: Left;   CYSTOSCOPY WITH RETROGRADE PYELOGRAM, URETEROSCOPY AND STENT PLACEMENT Left 05/01/2015   Procedure: CYSTOSCOPY WITH RETROGRADE PYELOGRAM AND STENT PLACEMENT;  Surgeon: Festus Aloe, MD;  [facility];  Service: Urology;  Laterality: Left;   CYSTOSCOPY WITH STENT PLACEMENT Bilateral 05/26/2015   Procedure: CYSTOSCOPY WITH STENT PLACEMENT;  Surgeon: Festus Aloe, MD;  Location: [facility];  Service: Urology;  Laterality: Bilateral;   CYSTOSCOPY WITH URETEROSCOPY Bilateral 05/26/2015   Procedure: CYSTOSCOPY  WITH URETEROSCOPY;  Surgeon: Festus Aloe, MD;  Location: [facility];  Service: Urology;  Laterality: Bilateral;   CYSTOSCOPY/URETEROSCOPY/HOLMIUM LASER/STENT PLACEMENT Right 06/09/2015   Procedure: CYSTOSCOPY/URETEROSCOPY/STENT PLACEMENT REMOVAL LEFT URETERAL STENT;  Surgeon: Festus Aloe, MD;  Location[facility];  Service: Urology;  Laterality: Right;   DIAGNOSTIC LAPAROSCOPY     HOLMIUM LASER APPLICATION Left 78/46/9629   Procedure: HOLMIUM LASER APPLICATION;  Surgeon: Festus Aloe, MD;  Location: [facility];  Service: Urology;  Laterality: Left;   HOLMIUM LASER APPLICATION Right 52/84/1324    Procedure: HOLMIUM LASER APPLICATION;  Surgeon: Festus Aloe, MD;  Location: [facility];  Service: Urology;  Laterality: Right;   INSERTION OF MESH N/A 09/05/2016   Procedure: INSERTION OF MESH;  Surgeon: [name], MD;  Location: [place];  Service: General;  Laterality: N/A;   LAPAROSCOPIC ASSISTED VAGINAL HYSTERECTOMY  04/23/2002   _0   by Dr Willis Modena;  With Left Salpingoophorectomy/  Anterior Repair/  Tension Free Tape Sling placement   LASER ABLATION OF THE CERVIX  x2  1980's   POSTERIOR LAMINECTOMY / DECOMPRESSION LUMBAR SPINE  02/03/2020   _1   Dr Ellene Route;   L4--5 and fusion   POSTERIOR LUMBAR FUSION  01/21/2019   _2  by Dr Ellene Route;   L5--S1   TUBAL LIGATION  1980's   VENTRAL HERNIA REPAIR N/A 09/05/2016   Procedure: LAPAROSCOPIC VENTRAL HERNIA;  Surgeon: [name], MD;  Location: [place];  Service: General;  Laterality: N/A;    Family History  Problem Relation Age of Onset   Alcohol abuse Mother    Lung cancer Mother    Colon cancer Mother    Arthritis Father    Hyperlipidemia Father    Heart disease Father    Hypertension Father    COPD Father    Kidney disease Paternal Uncle    Cancer Maternal Grandmother        colon   Colon cancer Maternal Grandmother    Rectal cancer Maternal Grandmother    Cancer Paternal Grandmother        breast   Kidney cancer Sister        lung cancer   Cirrhosis Sister    Esophageal cancer Neg Hx    Stomach cancer Neg Hx     Social History   Socioeconomic History   Marital status: Married    Spouse name: Not on file   Number of children: Not on file   Years of education: Not on file   Highest education level: Not on file  Occupational History   Not on file  Tobacco Use   Smoking status: Never   Smokeless tobacco: Never  Vaping Use   Vaping Use: Never used  Substance and Sexual Activity   Alcohol use: Yes    Comment: socially   Drug use: Never   Sexual activity: Yes  Other Topics Concern   Not on file  Social  History Narrative   Not on file   Social Determinants of Health   Financial Resource Strain: Low Risk    Difficulty of Paying Living Expenses: Not hard at all  Food Insecurity: No Food Insecurity   Worried About Charity fundraiser in the Last Year: Never true   [place] in the Last Year: Never true  Transportation Needs: No Transportation Needs   Lack of Transportation (Medical): No   Lack of Transportation (Non-Medical): No  Physical Activity: Sufficiently Active   Days of Exercise per Week: 7  days   Minutes of Exercise per Session: 30 min  Stress: No Stress Concern Present   Feeling of Stress : Only a little  Social Connections: Not on file  Intimate Partner Violence: Not on file    Outpatient Medications Prior to Visit  Medication Sig Dispense Refill   Accu-Chek Softclix Lancets lancets Use as instructed 100 each 12   amphetamine-dextroamphetamine (ADDERALL) 30 MG tablet Take one tablet twice daily. 60 tablet 0   atorvastatin (LIPITOR) 10 MG tablet TAKE 1 TABLET BY MOUTH DAILY (Patient taking differently: Take 10 mg by mouth daily.) 90 tablet 2   Biotin 1000 MCG tablet Take 1,000 mcg by mouth daily.     blood glucose meter kit and supplies Dispense based on patient and insurance preference. Use up to four times daily as directed. (FOR ICD-10 E10.9, E11.9). 1 each 0   citalopram (CELEXA) 20 MG tablet Take 1 tablet (20 mg total) by mouth daily. 90 tablet 3   cyclobenzaprine (FLEXERIL) 10 MG tablet Take 10 mg by mouth at bedtime.     furosemide (LASIX) 20 MG tablet TAKE 1 TABLET BY MOUTH ONCE A DAY AS NEEDED FOR SWELLING (Patient taking differently: Take 20 mg by mouth daily as needed for fluid.) 30 tablet 1   losartan-hydrochlorothiazide (HYZAAR) 100-12.5 MG tablet TAKE 1 TABLET BY MOUTH EVERY DAY (Patient taking differently: Take 1 tablet by mouth daily.) 90 tablet 1   metFORMIN (GLUCOPHAGE) 500 MG tablet Take 1 tablet (500 mg total) by mouth 2 (two) times daily with a  meal. 180 tablet 0   nortriptyline (PAMELOR) 25 MG capsule Take 25 mg by mouth at bedtime.     nystatin cream (MYCOSTATIN) Apply 1 application topically 2 (two) times daily. 30 g 1   ondansetron (ZOFRAN) 8 MG tablet Take 1 tablet (8 mg total) by mouth every 8 (eight) hours as needed for nausea or vomiting. 60 tablet 1   oxyCODONE-acetaminophen (PERCOCET) 10-325 MG tablet Take 1 tablet by mouth every 8 (eight) hours as needed for pain.     pantoprazole (PROTONIX) 40 MG tablet TAKE 1 TABLET(40 MG) BY MOUTH TWICE DAILY (Patient taking differently: 40 mg 2 (two) times daily.) 180 tablet 1   Probiotic Product (ALIGN PO) Take 1 capsule by mouth daily.     SUMAtriptan (IMITREX) 100 MG tablet May repeat in 2 hours if headache persists or recurs.  Do not take more than 2 in 24 hours (Patient taking differently: Take 100 mg by mouth every 2 (two) hours as needed for migraine. May repeat in 2 hours if headache persists or recurs.  Do not take more than 2 in 24 hours) 10 tablet 11   SYNTHROID 50 MCG tablet TAKE 1 TABLET BY MOUTH EVERY DAY (Patient taking differently: Take 50 mcg by mouth daily before breakfast.) 90 tablet 0   Turmeric-Ginger 135-6 MG CHEW Chew 2 each by mouth daily.     Vitamin D, Ergocalciferol, (DRISDOL) 1.25 MG (50000 UNIT) CAPS capsule TAKE 1 CAPSULE BY MOUTH 1 TIME A WEEK (Patient taking differently: Take 50,000 Units by mouth every Monday. TAKE 1 CAPSULE BY MOUTH 1 TIME A WEEK) 12 capsule 3   glucose blood test strip Use as instructed 100 each 12   No facility-administered medications prior to visit.    Allergies  Allergen Reactions   Keflex [Cephalexin] Swelling    Tongue swelling    ROS Review of Systems  Constitutional:  Negative for chills and fever.  Endocrine: Negative for polyuria.  Genitourinary:  Negative for dysuria.     Objective:    Physical Exam Vitals reviewed.  Constitutional:      Appearance: Normal appearance.  Cardiovascular:     Rate and Rhythm:  Normal rate and regular rhythm.  Pulmonary:     Effort: Pulmonary effort is normal.     Breath sounds: Normal breath sounds.  Neurological:     Mental Status: She is alert.    BP 132/80    Pulse (!) 120    Temp 97.7 F (36.5 C) (Oral)    Ht _0  (1.651 m)    Wt 264 lb 8 oz (120 kg)    SpO2 96%    BMI 44.02 kg/m  Wt Readings from Last 3 Encounters:  08/27/21 264 lb 8 oz (120 kg)  08/25/21 264 lb (119.7 kg)  08/17/21 267 lb 12.8 oz (121.5 kg)     Health Maintenance Due  Topic Date Due   FOOT EXAM  Never done   INFLUENZA VACCINE  01/25/2021   Zoster Vaccines- Shingrix (2 of 2) 05/11/2021    There are no preventive care reminders to display for this patient.  Lab Results  Component Value Date   TSH 6.88 (H) 08/17/2021   Lab Results  Component Value Date   WBC 9.8 08/25/2021   HGB 14.2 08/25/2021   HCT 44.2 08/25/2021   MCV 83.4 08/25/2021   PLT 336 08/25/2021   Lab Results  Component Value Date   NA 132 (L) 08/25/2021   K 3.9 08/25/2021   CO2 24 08/25/2021   GLUCOSE 354 (H) 08/25/2021   BUN 12 08/25/2021   CREATININE 0.91 08/25/2021   BILITOT 0.3 08/25/2021   ALKPHOS 163 (H) 08/25/2021   AST 76 (H) 08/25/2021   ALT 73 (H) 08/25/2021   PROT 7.5 08/25/2021   ALBUMIN 3.6 08/25/2021   CALCIUM 9.1 08/25/2021   ANIONGAP 10 08/25/2021   GFR 74.60 12/07/2020   Lab Results  Component Value Date   CHOL 156 03/23/2020   Lab Results  Component Value Date   HDL 45 (L) 03/23/2020   Lab Results  Component Value Date   LDLCALC 86 03/23/2020   Lab Results  Component Value Date   TRIG 155 (H) 03/23/2020   Lab Results  Component Value Date   CHOLHDL 3.5 03/23/2020   Lab Results  Component Value Date   HGBA1C 12.6 (H) 08/17/2021      Assessment & Plan:   #1 type 2 diabetes with recent severe hyperglycemia.  Patient had been controlled with Ozempic but came off because of availability issues.  Recent A1c 12.6.  Fasting glucose this morning 296.  -Start  Tyler Aas 10 units once daily -Continue metformin 500 mg twice daily -Add NovoLog 5 units 3 times daily with meals -Check blood sugars regularly and get some readings back to me in a few days.  We will likely need to titrate up her insulin more -She would not be a good candidate for SGLT2 medications because of prior history of recurrent yeast vaginitis -She is not a good candidate for GLP-1 medications at this point because of her upcoming gastric surgery. -We did discuss the fact that ultimate best treatment for her diabetes will be the weight loss hopefully accomplished with the surgery.  We will need to modify her insulin as her weight loss occurs   Meds ordered this encounter  Medications   glucose blood (ACCU-CHEK GUIDE) test strip    Sig: Use to test blood sugars  3 times a day.    Dispense:  300 each    Refill:  3   insulin aspart (NOVOLOG FLEXPEN) 100 UNIT/ML FlexPen    Sig: Inject 5 Units into the skin 3 (three) times daily with meals.    Dispense:  6 mL    Refill:  5    Follow-up: No follow-ups on file.    Carolann Littler, MD

## 2021-08-27 NOTE — Telephone Encounter (Signed)
Per her formulary, they cover Humalog; okay to change? ?

## 2021-08-30 ENCOUNTER — Ambulatory Visit (HOSPITAL_BASED_OUTPATIENT_CLINIC_OR_DEPARTMENT_OTHER)
Admission: RE | Admit: 2021-08-30 | Payer: Medicare Other | Source: Home / Self Care | Admitting: Obstetrics and Gynecology

## 2021-08-30 DIAGNOSIS — N907 Vulvar cyst: Secondary | ICD-10-CM

## 2021-08-30 HISTORY — DX: Vulvar cyst: N90.7

## 2021-08-30 HISTORY — DX: Other chronic pain: G89.29

## 2021-08-30 HISTORY — DX: Type 2 diabetes mellitus without complications: E11.9

## 2021-08-30 HISTORY — DX: Major depressive disorder, single episode, unspecified: F32.9

## 2021-08-30 HISTORY — DX: Low back pain, unspecified: M54.50

## 2021-08-30 LAB — TYPE AND SCREEN
ABO/RH(D): A POS
Antibody Screen: NEGATIVE

## 2021-08-30 SURGERY — EXCISION, LESION, VAGINA
Anesthesia: Choice | Laterality: Left

## 2021-08-31 ENCOUNTER — Encounter: Payer: Self-pay | Admitting: Family Medicine

## 2021-08-31 NOTE — Progress Notes (Signed)
Anesthesia Chart Review ? ? Case: [id number] Date/Time: 09/07/21 1100  ? Procedures:  ?    LAPAROSCOPIC GASTRIC SLEEVE RESECTION ?    UPPER GI ENDOSCOPY  ? Anesthesia type: General  ? Pre-op diagnosis: morbid obesity  ? [facility]: [facility] / [facility]  ? Surgeons: [name], MD  ? ?  ? ? ?DISCUSSION:61 y.o. never smoker with h/o PONV, OSA, HTN, DM II, morbid obesity scheduled for above procedure 09/07/2021 with Dr. Romana Juniper.  ? ?Poorly controlled diabetes. Elevated glucose at PAT visit, glucose 354. A1C 12/6.   ? ?Pt had a follow up with PCP on 08/27/21. Per note she was started on Tresiba 10 units once daily, advised to continue metformin 500mg  BID, NovoLog 5 units 3 times with meals added.   ? ?Evaluate blood glucose DOS.  ? ?VS: BP 132/68   Pulse (!) 127   Temp 37.4 ?C (Oral)   Resp 16   Ht 5\' 5"  (1.651 m)   Wt 119.7 kg   SpO2 98%   BMI 43.93 kg/m?  ? ?PROVIDERS: ?[name], MD is PCP  ? ? ?LABS: Labs reviewed: Acceptable for surgery. ?(all labs ordered are listed, but only abnormal results are displayed) ? ?Labs Reviewed  ?CBC WITH DIFFERENTIAL/PLATELET - Abnormal; Notable for the following components:  ?    Result Value  ? RBC 5.30 (*)   ? Basophils Absolute 0.2 (*)   ? All other components within normal limits  ?COMPREHENSIVE METABOLIC PANEL - Abnormal; Notable for the following components:  ? Sodium 132 (*)   ? Glucose, Bld 354 (*)   ? AST 76 (*)   ? ALT 73 (*)   ? Alkaline Phosphatase 163 (*)   ? All other components within normal limits  ?GLUCOSE, CAPILLARY - Abnormal; Notable for the following components:  ? Glucose-Capillary 348 (*)   ? All other components within normal limits  ?TYPE AND SCREEN  ? ? ? ?IMAGES: ? ? ?EKG: ? ? ?CV: ? ?Past Medical History:  ?Diagnosis Date  ? ADD (attention deficit disorder)   ? Anemia   ? Anxiety   ? Arthritis   ? shoulder, right (arthritis, tendonitis, bursitis)  ? Chronic low back pain   ? Complication of anesthesia   ? Dyspnea   ? Fatty liver   ?  GERD (gastroesophageal reflux disease)   ? History of cervical cancer   ? s/p  laser ablation of cervix 1980's  ? History of COVID-19 07/2019  ? History of kidney stones   ? Hyperlipidemia   ? Hypertension   ? Hypothyroidism   ? IBS (irritable bowel syndrome)   ? MDD (major depressive disorder)   ? Migraine   ? OSA (obstructive sleep apnea)   ? pt used cpap up until 2013 states lost wt and did not need anymore  ? PONV (postoperative nausea and vomiting)   ? Restless legs   ? Sleep apnea   ? wears CPAP  ? Type 2 diabetes mellitus ([place])   ? Urgency of urination   ? Vertigo   ? Vitamin D deficiency   ? Vulvar cyst   ? ? ?Past Surgical History:  ?Procedure Laterality Date  ? BUNIONECTOMY Right 2004  ? COLONOSCOPY    ? CYSTOSCOPY W/ RETROGRADES Bilateral 05/26/2015  ? Procedure: CYSTOSCOPY WITH RETROGRADE PYELOGRAM;  Surgeon: Festus Aloe, MD;  Location: [facility];  Service: Urology;  Laterality: Bilateral;  ? CYSTOSCOPY W/ URETERAL STENT REMOVAL Left 05/26/2015  ?  Procedure: CYSTOSCOPY WITH STENT REMOVAL;  Surgeon: Festus Aloe, MD;  Location: [facility];  Service: Urology;  Laterality: Left;  ? CYSTOSCOPY WITH RETROGRADE PYELOGRAM, URETEROSCOPY AND STENT PLACEMENT Left 05/01/2015  ? Procedure: CYSTOSCOPY WITH RETROGRADE PYELOGRAM AND STENT PLACEMENT;  Surgeon: Festus Aloe, MD;  [facility];  Service: Urology;  Laterality: Left;  ? CYSTOSCOPY WITH STENT PLACEMENT Bilateral 05/26/2015  ? Procedure: CYSTOSCOPY WITH STENT PLACEMENT;  Surgeon: Festus Aloe, MD;  Location: [facility];  Service: Urology;  Laterality: Bilateral;  ? CYSTOSCOPY WITH URETEROSCOPY Bilateral 05/26/2015  ? Procedure: CYSTOSCOPY WITH URETEROSCOPY;  Surgeon: Festus Aloe, MD;  Location: [facility];  Service: Urology;  Laterality: Bilateral;  ? CYSTOSCOPY/URETEROSCOPY/HOLMIUM LASER/STENT PLACEMENT Right 06/09/2015  ? Procedure: CYSTOSCOPY/URETEROSCOPY/STENT  PLACEMENT REMOVAL LEFT URETERAL STENT;  Surgeon: Festus Aloe, MD;  [facility];  Service: Urology;  Laterality: Right;  ? DIAGNOSTIC LAPAROSCOPY    ? HOLMIUM LASER APPLICATION Left 16/06/930  ? Procedure: HOLMIUM LASER APPLICATION;  Surgeon: Festus Aloe, MD;  [facility];  Service: Urology;  Laterality: Left;  ? HOLMIUM LASER APPLICATION Right 35/57/3220  ? Procedure: HOLMIUM LASER APPLICATION;  Surgeon: Festus Aloe, MD;  [facility];  Service: Urology;  Laterality: Right;  ? INSERTION OF MESH N/A 09/05/2016  ? Procedure: INSERTION OF MESH;  Surgeon: [name], MD;  Location: [place];  Service: General;  Laterality: N/A;  ? LAPAROSCOPIC ASSISTED VAGINAL HYSTERECTOMY  04/23/2002  ? $'@WH'G$   by Dr Willis Modena;  With Left Salpingoophorectomy/  Anterior Repair/  Tension Free Tape Sling placement  ? LASER ABLATION OF THE CERVIX  x2  1980's  ? POSTERIOR LAMINECTOMY / DECOMPRESSION LUMBAR SPINE  02/03/2020  ? $'@MC'V$   Dr Ellene Route;   L4--5 and fusion  ? POSTERIOR LUMBAR FUSION  01/21/2019  ? $'@MC'W$  by Dr Ellene Route;   L5--S1  ? TUBAL LIGATION  1980's  ? VENTRAL HERNIA REPAIR N/A 09/05/2016  ? Procedure: LAPAROSCOPIC VENTRAL HERNIA;  Surgeon: [name], MD;  Location: [place];  Service: General;  Laterality: N/A;  ? ? ?MEDICATIONS: ? Accu-Chek Softclix Lancets lancets  ? amphetamine-dextroamphetamine (ADDERALL) 30 MG tablet  ? atorvastatin (LIPITOR) 10 MG tablet  ? Biotin 1000 MCG tablet  ? blood glucose meter kit and supplies  ? citalopram (CELEXA) 20 MG tablet  ? cyclobenzaprine (FLEXERIL) 10 MG tablet  ? furosemide (LASIX) 20 MG tablet  ? glucose blood (ACCU-CHEK GUIDE) test strip  ? insulin lispro (HUMALOG) 100 UNIT/ML KwikPen  ? losartan-hydrochlorothiazide (HYZAAR) 100-12.5 MG tablet  ? metFORMIN (GLUCOPHAGE) 500 MG tablet  ? nortriptyline (PAMELOR) 25 MG capsule  ? nystatin cream (MYCOSTATIN)  ? ondansetron (ZOFRAN) 8 MG tablet  ? oxyCODONE-acetaminophen (PERCOCET) 10-325 MG  tablet  ? pantoprazole (PROTONIX) 40 MG tablet  ? Probiotic Product (ALIGN PO)  ? SUMAtriptan (IMITREX) 100 MG tablet  ? SYNTHROID 50 MCG tablet  ? Turmeric-Ginger 135-6 MG CHEW  ? Vitamin D, Ergocalciferol, (DRISDOL) 1.25 MG (50000 UNIT) CAPS capsule  ? ?No current facility-administered medications for this encounter.  ? ? ?Konrad Felix Ward, PA-C ?[facility] ?[phone] ? ? ? ? ? ? ?

## 2021-09-03 ENCOUNTER — Encounter (HOSPITAL_COMMUNITY)
Admission: RE | Admit: 2021-09-03 | Discharge: 2021-09-03 | Disposition: A | Discharge status: Home / Self Care | Payer: Medicare Other | Source: Ambulatory Visit | Attending: Surgery | Admitting: Surgery

## 2021-09-03 ENCOUNTER — Other Ambulatory Visit: Payer: Self-pay

## 2021-09-03 DIAGNOSIS — Z20822 Contact with and (suspected) exposure to covid-19: Secondary | ICD-10-CM | POA: Diagnosis not present

## 2021-09-03 DIAGNOSIS — Z01812 Encounter for preprocedural laboratory examination: Secondary | ICD-10-CM | POA: Insufficient documentation

## 2021-09-03 LAB — SARS CORONAVIRUS 2 (TAT 6-24 HRS): SARS Coronavirus 2: NEGATIVE

## 2021-09-07 ENCOUNTER — Other Ambulatory Visit: Payer: Self-pay

## 2021-09-07 ENCOUNTER — Encounter (HOSPITAL_COMMUNITY)
Admission: RE | Disposition: A | Discharge status: Home / Self Care | Payer: Self-pay | Source: Home / Self Care | Attending: Surgery

## 2021-09-07 ENCOUNTER — Inpatient Hospital Stay (HOSPITAL_COMMUNITY): Payer: Medicare Other | Admitting: Physician Assistant

## 2021-09-07 ENCOUNTER — Encounter (HOSPITAL_COMMUNITY): Payer: Self-pay | Admitting: Surgery

## 2021-09-07 ENCOUNTER — Inpatient Hospital Stay (HOSPITAL_COMMUNITY)
Admission: RE | Admit: 2021-09-07 | Discharge: 2021-09-09 | DRG: 621 | Disposition: A | Discharge status: Home / Self Care | Payer: Medicare Other | Attending: Surgery | Admitting: Surgery

## 2021-09-07 DIAGNOSIS — K660 Peritoneal adhesions (postprocedural) (postinfection): Secondary | ICD-10-CM | POA: Diagnosis present

## 2021-09-07 DIAGNOSIS — M549 Dorsalgia, unspecified: Secondary | ICD-10-CM | POA: Diagnosis present

## 2021-09-07 DIAGNOSIS — E1165 Type 2 diabetes mellitus with hyperglycemia: Secondary | ICD-10-CM

## 2021-09-07 DIAGNOSIS — I509 Heart failure, unspecified: Secondary | ICD-10-CM | POA: Diagnosis present

## 2021-09-07 DIAGNOSIS — K219 Gastro-esophageal reflux disease without esophagitis: Secondary | ICD-10-CM | POA: Diagnosis present

## 2021-09-07 DIAGNOSIS — K589 Irritable bowel syndrome without diarrhea: Secondary | ICD-10-CM | POA: Diagnosis present

## 2021-09-07 DIAGNOSIS — E039 Hypothyroidism, unspecified: Secondary | ICD-10-CM | POA: Diagnosis present

## 2021-09-07 DIAGNOSIS — E119 Type 2 diabetes mellitus without complications: Secondary | ICD-10-CM | POA: Diagnosis present

## 2021-09-07 DIAGNOSIS — G8929 Other chronic pain: Secondary | ICD-10-CM | POA: Diagnosis present

## 2021-09-07 DIAGNOSIS — E785 Hyperlipidemia, unspecified: Secondary | ICD-10-CM | POA: Diagnosis present

## 2021-09-07 DIAGNOSIS — E876 Hypokalemia: Secondary | ICD-10-CM | POA: Diagnosis present

## 2021-09-07 DIAGNOSIS — G4733 Obstructive sleep apnea (adult) (pediatric): Secondary | ICD-10-CM | POA: Diagnosis present

## 2021-09-07 DIAGNOSIS — Z79891 Long term (current) use of opiate analgesic: Secondary | ICD-10-CM

## 2021-09-07 DIAGNOSIS — I10 Essential (primary) hypertension: Secondary | ICD-10-CM

## 2021-09-07 DIAGNOSIS — Z8616 Personal history of COVID-19: Secondary | ICD-10-CM

## 2021-09-07 DIAGNOSIS — K760 Fatty (change of) liver, not elsewhere classified: Secondary | ICD-10-CM | POA: Diagnosis present

## 2021-09-07 DIAGNOSIS — Z6841 Body Mass Index (BMI) 40.0 and over, adult: Secondary | ICD-10-CM

## 2021-09-07 DIAGNOSIS — E1169 Type 2 diabetes mellitus with other specified complication: Secondary | ICD-10-CM | POA: Diagnosis present

## 2021-09-07 DIAGNOSIS — I110 Hypertensive heart disease with heart failure: Secondary | ICD-10-CM | POA: Diagnosis present

## 2021-09-07 DIAGNOSIS — Z79899 Other long term (current) drug therapy: Secondary | ICD-10-CM

## 2021-09-07 DIAGNOSIS — R748 Abnormal levels of other serum enzymes: Secondary | ICD-10-CM | POA: Diagnosis present

## 2021-09-07 DIAGNOSIS — Z881 Allergy status to other antibiotic agents status: Secondary | ICD-10-CM | POA: Diagnosis not present

## 2021-09-07 DIAGNOSIS — G43909 Migraine, unspecified, not intractable, without status migrainosus: Secondary | ICD-10-CM | POA: Diagnosis present

## 2021-09-07 DIAGNOSIS — M1990 Unspecified osteoarthritis, unspecified site: Secondary | ICD-10-CM | POA: Diagnosis present

## 2021-09-07 DIAGNOSIS — D75839 Thrombocytosis, unspecified: Secondary | ICD-10-CM | POA: Diagnosis present

## 2021-09-07 DIAGNOSIS — Z01818 Encounter for other preprocedural examination: Secondary | ICD-10-CM

## 2021-09-07 DIAGNOSIS — Z8249 Family history of ischemic heart disease and other diseases of the circulatory system: Secondary | ICD-10-CM | POA: Diagnosis not present

## 2021-09-07 HISTORY — PX: UPPER GI ENDOSCOPY: SHX6162

## 2021-09-07 HISTORY — PX: LAPAROSCOPIC GASTRIC SLEEVE RESECTION: SHX5895

## 2021-09-07 LAB — GLUCOSE, CAPILLARY
Glucose-Capillary: 187 mg/dL — ABNORMAL HIGH (ref 70–99)
Glucose-Capillary: 185 mg/dL — ABNORMAL HIGH (ref 70–99)
Glucose-Capillary: 274 mg/dL — ABNORMAL HIGH (ref 70–99)

## 2021-09-07 LAB — TYPE AND SCREEN
ABO/RH(D): A POS
Antibody Screen: NEGATIVE

## 2021-09-07 SURGERY — GASTRECTOMY, SLEEVE, LAPAROSCOPIC
Anesthesia: General | Site: Esophagus

## 2021-09-07 MED ORDER — METOPROLOL TARTRATE 5 MG/5ML IV SOLN: 5.0000 mg | Freq: Four times a day (QID) | INTRAVENOUS | Status: DC | PRN

## 2021-09-07 MED ORDER — FENTANYL CITRATE PF 50 MCG/ML IJ SOSY
PREFILLED_SYRINGE | INTRAMUSCULAR | Status: AC
  Filled 2021-09-07: qty 1

## 2021-09-07 MED ORDER — LIDOCAINE 2% (20 MG/ML) 5 ML SYRINGE
INTRAMUSCULAR | Status: DC | PRN
  Administered 2021-09-07: 1.5 mg/kg/h via INTRAVENOUS

## 2021-09-07 MED ORDER — ESMOLOL HCL 100 MG/10ML IV SOLN
INTRAVENOUS | Status: AC
  Filled 2021-09-07: qty 10

## 2021-09-07 MED ORDER — EPHEDRINE 5 MG/ML INJ
INTRAVENOUS | Status: AC
  Filled 2021-09-07: qty 5

## 2021-09-07 MED ORDER — OXYCODONE HCL 5 MG PO TABS
ORAL_TABLET | ORAL | Status: AC
Start: 2021-09-07 — End: 2021-09-08
  Filled 2021-09-07: qty 1

## 2021-09-07 MED ORDER — DEXAMETHASONE SODIUM PHOSPHATE 10 MG/ML IJ SOLN
INTRAMUSCULAR | Status: DC | PRN
  Administered 2021-09-07: 6 mg via INTRAVENOUS

## 2021-09-07 MED ORDER — OXYCODONE HCL 5 MG PO TABS
5.0000 mg | ORAL_TABLET | Freq: Once | ORAL | Status: AC | PRN
  Administered 2021-09-07: 5 mg via ORAL

## 2021-09-07 MED ORDER — PHENYLEPHRINE 40 MCG/ML (10ML) SYRINGE FOR IV PUSH (FOR BLOOD PRESSURE SUPPORT)
PREFILLED_SYRINGE | INTRAVENOUS | Status: AC
  Filled 2021-09-07: qty 10

## 2021-09-07 MED ORDER — ROCURONIUM BROMIDE 10 MG/ML (PF) SYRINGE
PREFILLED_SYRINGE | INTRAVENOUS | Status: AC
  Filled 2021-09-07: qty 10

## 2021-09-07 MED ORDER — KETAMINE HCL 50 MG/5ML IJ SOSY
PREFILLED_SYRINGE | INTRAMUSCULAR | Status: AC
  Filled 2021-09-07: qty 5

## 2021-09-07 MED ORDER — BUPIVACAINE LIPOSOME 1.3 % IJ SUSP: 20.0000 mL | Freq: Once | INTRAMUSCULAR | Status: DC

## 2021-09-07 MED ORDER — DEXAMETHASONE SODIUM PHOSPHATE 10 MG/ML IJ SOLN
INTRAMUSCULAR | Status: AC
  Filled 2021-09-07: qty 1

## 2021-09-07 MED ORDER — ALBUMIN HUMAN 5 % IV SOLN: INTRAVENOUS | Status: DC | PRN

## 2021-09-07 MED ORDER — ONDANSETRON HCL 4 MG/2ML IJ SOLN
INTRAMUSCULAR | Status: AC
  Filled 2021-09-07: qty 2

## 2021-09-07 MED ORDER — SCOPOLAMINE 1 MG/3DAYS TD PT72
1.0000 | MEDICATED_PATCH | TRANSDERMAL | Status: DC
Start: 2021-09-07 — End: 2021-09-07
  Administered 2021-09-07: 1.5 mg via TRANSDERMAL
  Filled 2021-09-07: qty 1

## 2021-09-07 MED ORDER — PROPOFOL 10 MG/ML IV BOLUS
INTRAVENOUS | Status: DC | PRN
  Administered 2021-09-07: 200 mg via INTRAVENOUS

## 2021-09-07 MED ORDER — SUGAMMADEX SODIUM 500 MG/5ML IV SOLN
INTRAVENOUS | Status: DC | PRN
  Administered 2021-09-07: 500 mg via INTRAVENOUS

## 2021-09-07 MED ORDER — GABAPENTIN 300 MG PO CAPS
300.0000 mg | ORAL_CAPSULE | ORAL | Status: AC
  Administered 2021-09-07: 300 mg via ORAL
  Filled 2021-09-07: qty 1

## 2021-09-07 MED ORDER — FENTANYL CITRATE (PF) 250 MCG/5ML IJ SOLN
INTRAMUSCULAR | Status: DC | PRN
  Administered 2021-09-07 (×3): 50 ug via INTRAVENOUS

## 2021-09-07 MED ORDER — MIDAZOLAM HCL 2 MG/2ML IJ SOLN
INTRAMUSCULAR | Status: AC
  Filled 2021-09-07: qty 2

## 2021-09-07 MED ORDER — VASOPRESSIN 20 UNIT/ML IV SOLN
INTRAVENOUS | Status: DC | PRN
Start: 2021-09-07 — End: 2021-09-07
  Administered 2021-09-07: 2 [IU] via INTRAVENOUS

## 2021-09-07 MED ORDER — BUPIVACAINE-EPINEPHRINE (PF) 0.25% -1:200000 IJ SOLN
INTRAMUSCULAR | Status: AC
  Filled 2021-09-07: qty 30

## 2021-09-07 MED ORDER — ONDANSETRON HCL 4 MG/2ML IJ SOLN
INTRAMUSCULAR | Status: DC | PRN
  Administered 2021-09-07: 4 mg via INTRAVENOUS

## 2021-09-07 MED ORDER — METOCLOPRAMIDE HCL 5 MG/ML IJ SOLN
10.0000 mg | Freq: Three times a day (TID) | INTRAMUSCULAR | Status: DC
  Administered 2021-09-07 – 2021-09-09 (×6): 10 mg via INTRAVENOUS
  Filled 2021-09-07 (×6): qty 2

## 2021-09-07 MED ORDER — BUPIVACAINE-EPINEPHRINE 0.25% -1:200000 IJ SOLN
INTRAMUSCULAR | Status: DC | PRN
  Administered 2021-09-07: 30 mL

## 2021-09-07 MED ORDER — BUPIVACAINE LIPOSOME 1.3 % IJ SUSP
INTRAMUSCULAR | Status: AC
  Filled 2021-09-07: qty 20

## 2021-09-07 MED ORDER — OXYCODONE HCL 5 MG/5ML PO SOLN
5.0000 mg | Freq: Four times a day (QID) | ORAL | Status: DC | PRN
  Administered 2021-09-08 (×2): 5 mg via ORAL
  Filled 2021-09-07 (×3): qty 5

## 2021-09-07 MED ORDER — ORAL CARE MOUTH RINSE: 15.0000 mL | Freq: Once | OROMUCOSAL | Status: AC

## 2021-09-07 MED ORDER — LIDOCAINE HCL (PF) 2 % IJ SOLN
INTRAMUSCULAR | Status: AC
  Filled 2021-09-07: qty 5

## 2021-09-07 MED ORDER — METHOCARBAMOL 1000 MG/10ML IJ SOLN
500.0000 mg | Freq: Four times a day (QID) | INTRAVENOUS | Status: DC | PRN
  Filled 2021-09-07: qty 5

## 2021-09-07 MED ORDER — LACTATED RINGERS IR SOLN
Status: DC | PRN
  Administered 2021-09-07: 1000 mL

## 2021-09-07 MED ORDER — BUPIVACAINE LIPOSOME 1.3 % IJ SUSP
INTRAMUSCULAR | Status: DC | PRN
  Administered 2021-09-07: 20 mL

## 2021-09-07 MED ORDER — SUMATRIPTAN SUCCINATE 50 MG PO TABS
100.0000 mg | ORAL_TABLET | ORAL | Status: DC | PRN
  Filled 2021-09-07: qty 2

## 2021-09-07 MED ORDER — MIDAZOLAM HCL 5 MG/5ML IJ SOLN
INTRAMUSCULAR | Status: DC | PRN
  Administered 2021-09-07: 2 mg via INTRAVENOUS

## 2021-09-07 MED ORDER — LIDOCAINE 2% (20 MG/ML) 5 ML SYRINGE
INTRAMUSCULAR | Status: DC | PRN
  Administered 2021-09-07: 100 mg via INTRAVENOUS

## 2021-09-07 MED ORDER — GABAPENTIN 100 MG PO CAPS
200.0000 mg | ORAL_CAPSULE | Freq: Two times a day (BID) | ORAL | Status: DC
  Administered 2021-09-07 – 2021-09-09 (×4): 200 mg via ORAL
  Filled 2021-09-07 (×4): qty 2

## 2021-09-07 MED ORDER — SIMETHICONE 80 MG PO CHEW
80.0000 mg | CHEWABLE_TABLET | Freq: Four times a day (QID) | ORAL | Status: DC | PRN
Start: 2021-09-07 — End: 2021-09-09
  Administered 2021-09-08 (×3): 80 mg via ORAL
  Filled 2021-09-07 (×3): qty 1

## 2021-09-07 MED ORDER — PHENYLEPHRINE 40 MCG/ML (10ML) SYRINGE FOR IV PUSH (FOR BLOOD PRESSURE SUPPORT)
PREFILLED_SYRINGE | INTRAVENOUS | Status: DC | PRN
  Administered 2021-09-07 (×5): 80 ug via INTRAVENOUS

## 2021-09-07 MED ORDER — LACTATED RINGERS IV SOLN: INTRAVENOUS | Status: DC

## 2021-09-07 MED ORDER — ACETAMINOPHEN 500 MG PO TABS
1000.0000 mg | ORAL_TABLET | Freq: Three times a day (TID) | ORAL | Status: DC
  Administered 2021-09-07 – 2021-09-09 (×6): 1000 mg via ORAL
  Filled 2021-09-07 (×6): qty 2

## 2021-09-07 MED ORDER — FENTANYL CITRATE (PF) 250 MCG/5ML IJ SOLN
INTRAMUSCULAR | Status: AC
  Filled 2021-09-07: qty 5

## 2021-09-07 MED ORDER — ACETAMINOPHEN 500 MG PO TABS
1000.0000 mg | ORAL_TABLET | ORAL | Status: AC
  Administered 2021-09-07: 1000 mg via ORAL
  Filled 2021-09-07: qty 2

## 2021-09-07 MED ORDER — NORTRIPTYLINE HCL 25 MG PO CAPS
25.0000 mg | ORAL_CAPSULE | Freq: Every day | ORAL | Status: DC
Start: 2021-09-07 — End: 2021-09-09
  Administered 2021-09-07 – 2021-09-08 (×2): 25 mg via ORAL
  Filled 2021-09-07 (×2): qty 1

## 2021-09-07 MED ORDER — ENOXAPARIN SODIUM 30 MG/0.3ML IJ SOSY
30.0000 mg | PREFILLED_SYRINGE | Freq: Two times a day (BID) | INTRAMUSCULAR | Status: DC
  Administered 2021-09-07 – 2021-09-09 (×4): 30 mg via SUBCUTANEOUS
  Filled 2021-09-07 (×4): qty 0.3

## 2021-09-07 MED ORDER — AMISULPRIDE (ANTIEMETIC) 5 MG/2ML IV SOLN
INTRAVENOUS | Status: AC
Start: 2021-09-07 — End: 2021-09-08
  Filled 2021-09-07: qty 4

## 2021-09-07 MED ORDER — KETAMINE HCL 10 MG/ML IJ SOLN
INTRAMUSCULAR | Status: DC | PRN
  Administered 2021-09-07: 50 mg via INTRAVENOUS

## 2021-09-07 MED ORDER — ROCURONIUM BROMIDE 10 MG/ML (PF) SYRINGE
PREFILLED_SYRINGE | INTRAVENOUS | Status: DC | PRN
  Administered 2021-09-07: 100 mg via INTRAVENOUS

## 2021-09-07 MED ORDER — CHLORHEXIDINE GLUCONATE 0.12 % MT SOLN
15.0000 mL | Freq: Once | OROMUCOSAL | Status: AC
  Administered 2021-09-07: 15 mL via OROMUCOSAL

## 2021-09-07 MED ORDER — HYDROMORPHONE HCL 1 MG/ML IJ SOLN: 0.5000 mg | INTRAMUSCULAR | Status: DC | PRN

## 2021-09-07 MED ORDER — APREPITANT 40 MG PO CAPS
40.0000 mg | ORAL_CAPSULE | ORAL | Status: AC
  Administered 2021-09-07: 40 mg via ORAL
  Filled 2021-09-07: qty 1

## 2021-09-07 MED ORDER — OXYCODONE HCL 5 MG/5ML PO SOLN: 5.0000 mg | Freq: Once | ORAL | Status: AC | PRN

## 2021-09-07 MED ORDER — ONDANSETRON HCL 4 MG/2ML IJ SOLN: 4.0000 mg | Freq: Once | INTRAMUSCULAR | Status: DC | PRN

## 2021-09-07 MED ORDER — SUGAMMADEX SODIUM 500 MG/5ML IV SOLN
INTRAVENOUS | Status: AC
  Filled 2021-09-07: qty 5

## 2021-09-07 MED ORDER — LEVOTHYROXINE SODIUM 50 MCG PO TABS
50.0000 ug | ORAL_TABLET | Freq: Every day | ORAL | Status: DC
  Administered 2021-09-08 – 2021-09-09 (×2): 50 ug via ORAL
  Filled 2021-09-07 (×2): qty 1

## 2021-09-07 MED ORDER — INSULIN ASPART 100 UNIT/ML IJ SOLN
0.0000 [IU] | INTRAMUSCULAR | Status: DC
  Administered 2021-09-07 (×2): 11 [IU] via SUBCUTANEOUS
  Administered 2021-09-08: 7 [IU] via SUBCUTANEOUS
  Administered 2021-09-08 (×3): 4 [IU] via SUBCUTANEOUS
  Administered 2021-09-08: 11 [IU] via SUBCUTANEOUS
  Administered 2021-09-08 – 2021-09-09 (×2): 3 [IU] via SUBCUTANEOUS
  Administered 2021-09-09: 4 [IU] via SUBCUTANEOUS

## 2021-09-07 MED ORDER — HYDRALAZINE HCL 20 MG/ML IJ SOLN: 10.0000 mg | INTRAMUSCULAR | Status: DC | PRN

## 2021-09-07 MED ORDER — STERILE WATER FOR IRRIGATION IR SOLN
Status: DC | PRN
  Administered 2021-09-07: 1000 mL

## 2021-09-07 MED ORDER — FENTANYL CITRATE PF 50 MCG/ML IJ SOSY
25.0000 ug | PREFILLED_SYRINGE | INTRAMUSCULAR | Status: DC | PRN
  Administered 2021-09-07 (×2): 50 ug via INTRAVENOUS

## 2021-09-07 MED ORDER — ENSURE MAX PROTEIN PO LIQD
2.0000 [oz_av] | ORAL | Status: DC
  Administered 2021-09-08 – 2021-09-09 (×9): 2 [oz_av] via ORAL

## 2021-09-07 MED ORDER — CHLORHEXIDINE GLUCONATE 4 % EX LIQD: 60.0000 mL | Freq: Once | CUTANEOUS | Status: DC

## 2021-09-07 MED ORDER — ALBUMIN HUMAN 5 % IV SOLN
INTRAVENOUS | Status: AC
  Filled 2021-09-07: qty 250

## 2021-09-07 MED ORDER — SODIUM CHLORIDE 0.9 % IV SOLN: INTRAVENOUS | Status: DC

## 2021-09-07 MED ORDER — EPHEDRINE SULFATE-NACL 50-0.9 MG/10ML-% IV SOSY
PREFILLED_SYRINGE | INTRAVENOUS | Status: DC | PRN
  Administered 2021-09-07: 5 mg via INTRAVENOUS
  Administered 2021-09-07 (×3): 10 mg via INTRAVENOUS
  Administered 2021-09-07: 5 mg via INTRAVENOUS

## 2021-09-07 MED ORDER — HEPARIN SODIUM (PORCINE) 5000 UNIT/ML IJ SOLN
5000.0000 [IU] | INTRAMUSCULAR | Status: AC
  Administered 2021-09-07: 5000 [IU] via SUBCUTANEOUS
  Filled 2021-09-07: qty 1

## 2021-09-07 MED ORDER — CHLORHEXIDINE GLUCONATE 4 % EX LIQD
60.0000 mL | Freq: Once | CUTANEOUS | Status: AC
  Administered 2021-09-07: 4 via TOPICAL

## 2021-09-07 MED ORDER — CITALOPRAM HYDROBROMIDE 20 MG PO TABS
20.0000 mg | ORAL_TABLET | Freq: Every day | ORAL | Status: DC
  Administered 2021-09-07 – 2021-09-09 (×3): 20 mg via ORAL
  Filled 2021-09-07 (×3): qty 1

## 2021-09-07 MED ORDER — PANTOPRAZOLE SODIUM 40 MG IV SOLR
40.0000 mg | Freq: Every day | INTRAVENOUS | Status: DC
  Administered 2021-09-07 – 2021-09-08 (×2): 40 mg via INTRAVENOUS
  Filled 2021-09-07 (×2): qty 10

## 2021-09-07 MED ORDER — TRAMADOL HCL 50 MG PO TABS: 50.0000 mg | ORAL_TABLET | Freq: Four times a day (QID) | ORAL | Status: DC | PRN

## 2021-09-07 MED ORDER — 0.9 % SODIUM CHLORIDE (POUR BTL) OPTIME
TOPICAL | Status: DC | PRN
  Administered 2021-09-07: 1000 mL

## 2021-09-07 MED ORDER — ONDANSETRON HCL 4 MG/2ML IJ SOLN: 4.0000 mg | INTRAMUSCULAR | Status: DC | PRN

## 2021-09-07 MED ORDER — PHENYLEPHRINE HCL-NACL 20-0.9 MG/250ML-% IV SOLN
INTRAVENOUS | Status: DC | PRN
Start: 2021-09-07 — End: 2021-09-07
  Administered 2021-09-07: 35 ug/min via INTRAVENOUS

## 2021-09-07 MED ORDER — VASOPRESSIN 20 UNIT/ML IV SOLN
INTRAVENOUS | Status: AC
  Filled 2021-09-07: qty 1

## 2021-09-07 MED ORDER — ACETAMINOPHEN 160 MG/5ML PO SOLN: 1000.0000 mg | Freq: Three times a day (TID) | ORAL | Status: DC

## 2021-09-07 MED ORDER — SODIUM CHLORIDE 0.9 % IV SOLN
2.0000 g | INTRAVENOUS | Status: AC
  Administered 2021-09-07: 2 g via INTRAVENOUS
  Filled 2021-09-07: qty 2

## 2021-09-07 MED ORDER — AMISULPRIDE (ANTIEMETIC) 5 MG/2ML IV SOLN
10.0000 mg | Freq: Once | INTRAVENOUS | Status: AC | PRN
  Administered 2021-09-07: 10 mg via INTRAVENOUS

## 2021-09-07 MED ORDER — DOCUSATE SODIUM 100 MG PO CAPS
100.0000 mg | ORAL_CAPSULE | Freq: Two times a day (BID) | ORAL | Status: DC
  Administered 2021-09-07 – 2021-09-09 (×4): 100 mg via ORAL
  Filled 2021-09-07 (×4): qty 1

## 2021-09-07 SURGICAL SUPPLY — 78 items
APL PRP STRL LF DISP 70% ISPRP (MISCELLANEOUS) ×4
APL SKNCLS STERI-STRIP NONHPOA (GAUZE/BANDAGES/DRESSINGS) ×2
APPLIER CLIP ROT 10 11.4 M/L (STAPLE)
APPLIER CLIP ROT 13.4 12 LRG (CLIP)
APR CLP LRG 13.4X12 ROT 20 MLT (CLIP)
APR CLP MED LRG 11.4X10 (STAPLE)
BAG COUNTER SPONGE SURGICOUNT (BAG) IMPLANT
BAG LAPAROSCOPIC 12 15 PORT 16 (BASKET) IMPLANT
BAG RETRIEVAL 12/15 (BASKET)
BAG SPNG CNTER NS LX DISP (BAG)
BENZOIN TINCTURE PRP APPL 2/3 (GAUZE/BANDAGES/DRESSINGS) ×4 IMPLANT
BLADE SURG SZ11 CARB STEEL (BLADE) ×4 IMPLANT
BNDG ADH 1X3 SHEER STRL LF (GAUZE/BANDAGES/DRESSINGS) ×19 IMPLANT
BNDG ADH THN 3X1 STRL LF (GAUZE/BANDAGES/DRESSINGS) ×2
CABLE HIGH FREQUENCY MONO STRZ (ELECTRODE) IMPLANT
CHLORAPREP W/TINT 26 (MISCELLANEOUS) ×8 IMPLANT
CLIP APPLIE ROT 10 11.4 M/L (STAPLE) IMPLANT
CLIP APPLIE ROT 13.4 12 LRG (CLIP) IMPLANT
COVER SURGICAL LIGHT HANDLE (MISCELLANEOUS) ×4 IMPLANT
DEVICE SUT QUICK LOAD TK 5 (STAPLE) IMPLANT
DEVICE SUT TI-KNOT TK 5X26 (MISCELLANEOUS) IMPLANT
DRAPE UTILITY XL STRL (DRAPES) ×8 IMPLANT
ELECT REM PT RETURN 15FT ADLT (MISCELLANEOUS) ×4 IMPLANT
GAUZE SPONGE 4X4 12PLY STRL (GAUZE/BANDAGES/DRESSINGS) IMPLANT
GLOVE SURG ENC MOIS LTX SZ6 (GLOVE) ×4 IMPLANT
GLOVE SURG MICRO LTX SZ6 (GLOVE) ×4 IMPLANT
GLOVE SURG UNDER LTX SZ6.5 (GLOVE) ×4 IMPLANT
GOWN STRL REUS W/ TWL LRG LVL3 (GOWN DISPOSABLE) ×3 IMPLANT
GOWN STRL REUS W/ TWL XL LVL3 (GOWN DISPOSABLE) IMPLANT
GOWN STRL REUS W/TWL LRG LVL3 (GOWN DISPOSABLE) ×3
GOWN STRL REUS W/TWL XL LVL3 (GOWN DISPOSABLE)
GRASPER SUT TROCAR 14GX15 (MISCELLANEOUS) ×4 IMPLANT
IRRIG SUCT STRYKERFLOW 2 WTIP (MISCELLANEOUS) ×3
IRRIGATION SUCT STRKRFLW 2 WTP (MISCELLANEOUS) ×3 IMPLANT
KIT BASIN OR (CUSTOM PROCEDURE TRAY) ×4 IMPLANT
KIT TURNOVER KIT A (KITS) ×1 IMPLANT
MARKER SKIN DUAL TIP RULER LAB (MISCELLANEOUS) ×4 IMPLANT
MAT PREVALON FULL STRYKER (MISCELLANEOUS) ×4 IMPLANT
NDL SPNL 22GX3.5 QUINCKE BK (NEEDLE) ×3 IMPLANT
NEEDLE SPNL 22GX3.5 QUINCKE BK (NEEDLE) ×3 IMPLANT
PACK UNIVERSAL I (CUSTOM PROCEDURE TRAY) ×4 IMPLANT
RELOAD ENDO STITCH (ENDOMECHANICALS) IMPLANT
RELOAD STAPLE 60 3.6 BLU REG (STAPLE) ×3 IMPLANT
RELOAD STAPLE 60 3.8 GOLD REG (STAPLE) ×3 IMPLANT
RELOAD STAPLE 60 4.1 GRN THCK (STAPLE) IMPLANT
RELOAD STAPLER BLUE 60MM (STAPLE) ×12 IMPLANT
RELOAD STAPLER GOLD 60MM (STAPLE) ×4 IMPLANT
RELOAD STAPLER GREEN 60MM (STAPLE) IMPLANT
RELOAD SUT TRIPLE-STITCH 2-0 (ENDOMECHANICALS) IMPLANT
SCISSORS LAP 5X45 EPIX DISP (ENDOMECHANICALS) ×4 IMPLANT
SET TUBE SMOKE EVAC HIGH FLOW (TUBING) ×4 IMPLANT
SHEARS HARMONIC ACE PLUS 45CM (MISCELLANEOUS) ×4 IMPLANT
SLEEVE ADV FIXATION 5X100MM (TROCAR) ×8 IMPLANT
SLEEVE GASTRECTOMY 40FR VISIGI (MISCELLANEOUS) ×4 IMPLANT
SOL ANTI FOG 6CC (MISCELLANEOUS) ×3 IMPLANT
SOLUTION ANTI FOG 6CC (MISCELLANEOUS) ×1
SPIKE FLUID TRANSFER (MISCELLANEOUS) ×4 IMPLANT
SPONGE T-LAP 18X18 ~~LOC~~+RFID (SPONGE) ×4 IMPLANT
STAPLE LINE REINFORCEMENT LAP (STAPLE) ×4 IMPLANT
STAPLER ECHELON LONG 3000 60 (ENDOMECHANICALS) ×1 IMPLANT
STAPLER ECHELON LONG 60 440 (INSTRUMENTS) ×3 IMPLANT
STAPLER RELOAD BLUE 60MM (STAPLE) ×18
STAPLER RELOAD GOLD 60MM (STAPLE) ×6
STAPLER RELOAD GREEN 60MM (STAPLE)
STRIP CLOSURE SKIN 1/2X4 (GAUZE/BANDAGES/DRESSINGS) ×4 IMPLANT
SUT MNCRL AB 4-0 PS2 18 (SUTURE) ×4 IMPLANT
SUT SURGIDAC NAB ES-9 0 48 120 (SUTURE) IMPLANT
SUT VICRYL 0 TIES 12 18 (SUTURE) ×4 IMPLANT
SYR 10ML ECCENTRIC (SYRINGE) ×4 IMPLANT
SYR 20ML LL LF (SYRINGE) ×4 IMPLANT
SYR 50ML LL SCALE MARK (SYRINGE) ×3 IMPLANT
TOWEL OR 17X26 10 PK STRL BLUE (TOWEL DISPOSABLE) ×4 IMPLANT
TOWEL OR NON WOVEN STRL DISP B (DISPOSABLE) ×4 IMPLANT
TROCAR ADV FIXATION 5X100MM (TROCAR) ×4 IMPLANT
TROCAR BLADELESS 15MM (ENDOMECHANICALS) ×4 IMPLANT
TROCAR BLADELESS OPT 5 100 (ENDOMECHANICALS) ×4 IMPLANT
TUBING CONNECTING 10 (TUBING) ×4 IMPLANT
TUBING ENDO SMARTCAP (MISCELLANEOUS) ×4 IMPLANT

## 2021-09-07 NOTE — Anesthesia Procedure Notes (Signed)
Procedure Name: Intubation ?Date/Time: 09/07/2021 11:50 AM ?Performed by: Maxwell Caul, CRNA ?Pre-anesthesia Checklist: Patient identified, Emergency Drugs available, Suction available and Patient being monitored ?Patient Re-evaluated:Patient Re-evaluated prior to induction ?Oxygen Delivery Method: Circle system utilized ?Preoxygenation: Pre-oxygenation with 100% oxygen ?Induction Type: IV induction ?Ventilation: Mask ventilation without difficulty ?Laryngoscope Size: Mac and 4 ?Grade View: Grade I ?Tube type: Oral ?Tube size: 7.5 mm ?Number of attempts: 1 ?Airway Equipment and Method: Stylet ?Placement Confirmation: ETT inserted through vocal cords under direct vision, positive ETCO2 and breath sounds checked- equal and bilateral ?Secured at: 21 cm ?Tube secured with: Tape ?Dental Injury: Teeth and Oropharynx as per pre-operative assessment  ? ? ? ? ?

## 2021-09-07 NOTE — Op Note (Signed)
Operative Note ? ?[name] Sherman  ?[id number]  ?[id number]  ?09/07/2021 ? ? ?Surgeon: Romana Juniper MD FACS ?  ?Assistant: Greer Pickerel MD FACS ?  ?Procedure performed: laparoscopic sleeve gastrectomy, laparoscopic lysis of adhesions x30 minutes, upper endoscopy ?  ?Preop diagnosis: Morbid obesity Body mass index is 44.53 kg/m?[name] Sherman ?Post-op diagnosis/intraop findings: same, severe hepatic steatosis ?  ?Specimens: fundus ?Retained items: none  ?EBL: minimal  ?Complications: none ?  ?Description of procedure: After obtaining informed consent and administration of chemical DVT prophylaxis in holding, the patient was taken to the operating room and placed supine on operating room table where general endotracheal anesthesia was initiated, preoperative antibiotics were administered, SCDs applied, and a formal timeout was performed. The abdomen was prepped and draped in usual sterile fashion. Peritoneal access was gained using a Visiport technique in the left upper quadrant and insufflation to 15 mmHg ensued without issue. Gross inspection revealed no evidence of injury.  There were extensive omental adhesions to the anterior abdominal wall from her previous mesh.  Under direct visualization an additional left lateral 5 mm trocar was placed in the adhesions were carefully lysed with a combination of blunt dissection and harmonic scalpel until sufficient anterior abdominal wall was cleared for placement of the remaining trocars.  There is still a fair amount of omental adhesions extending inferiorly along the anterior abdominal wall which was left in situ. Under direct visualization two more 5 mm trochars were placed in the supraumbilical and right hemiabdomen and the 71m trocar in the right paramedian upper abdomen. Bilateral laparoscopic assisted TAPS blocks were performed with Exparel diluted with 0.25 percent Marcaine with epinephrine. The patient was placed in steep Trendelenburg and the liver retractor was introduced  through an incision in the upper midline and secured to the post externally to maintain the left lobe retracted anteriorly.  Of note the liver is significantly enlarged, pale and appears to have significant hepatic steatosis. ?There is no hiatal hernia on direct inspection. Using the Harmonic scalpel, the greater curvature of the stomach was dissected away from the greater omentum and short gastric vessels were divided. This began 6 cm from the pylorus, and dissection proceeded until the left crus was clearly exposed. There were some filmy adhesions of the posterior stomach to the pancreas which were divided with the Harmonic. Esophageal fat pad was mobilized off the anterior stomach slightly. The [id number] was then introduced and directed down towards the pylorus. This was placed to suction against the lesser curve. Serial fires of the linear cutting stapler with seam guard were then employed to create our sleeve. The first fire used a gold load and ensured adequate room at the angularis incisura. One additional gold load and then several blue loads were then employed to create a narrow tubular stomach up to the angle of His. The excised stomach was then removed through our 15 mm trocar site within an Endo Catch bag.  ?The visigi was taken off of suction and a few puffs of air were introduced, inflating the sleeve. No bubbles were observed in the irrigation fluid around the stomach and the shape was noted to be evenly tubular without any narrowing at the angularis. The visigi was then removed. Upper endoscopy was performed by the assistant surgeon and the sleeve was noted to be airtight, the staple line was hemostatic. Please see his separate note. The endoscope was removed. The 15 mm trocar site fascia in the right upper abdomen was closed with a 0 Vicryl using  the laparoscopic suture passer under direct visualization. The liver retractor was removed under direct visualization. The abdomen was then  desufflated and all remaining trochars removed. The skin incisions were closed with subcuticular Monocryl; benzoin, Steri-Strips and Band-Aids were applied The patient was then awakened, extubated and taken to PACU in stable condition.   ?  ?All counts were correct at the completion of the case.  ?  ? ?

## 2021-09-07 NOTE — Transfer of Care (Signed)
Immediate Anesthesia Transfer of Care Note ? ?Patient: [name] Sherman ? ?Procedure(s) Performed: LAPAROSCOPIC GASTRIC SLEEVE RESECTION (Abdomen) ?UPPER GI ENDOSCOPY (Esophagus) ? ?Patient Location: PACU ? ?Anesthesia Type:General ? ?Level of Consciousness: awake, alert  and oriented ? ?Airway & Oxygen Therapy: Patient Spontanous Breathing and Patient connected to face mask oxygen ? ?Post-op Assessment: Report given to RN and Post -op Vital signs reviewed and stable ? ?Post vital signs: Reviewed and stable ? ?Last Vitals:  ?Vitals Value Taken Time  ?BP 135/62 09/07/21 1330  ?Temp 36.6 ?C 09/07/21 1330  ?Pulse 104 09/07/21 1332  ?Resp 22 09/07/21 1332  ?SpO2 95 % 09/07/21 1332  ?Vitals shown include unvalidated device data. ? ?Last Pain:  ?Vitals:  ? 09/07/21 0959  ?TempSrc: Oral  ?PainSc: 6   ?   ? ?Patients Stated Pain Goal: 3 (09/07/21 0959) ? ?Complications: No notable events documented. ?

## 2021-09-07 NOTE — Progress Notes (Addendum)
PHARMACY CONSULT FOR:  Risk Assessment for Post-Discharge VTE Following Bariatric Surgery ? ?Post-Discharge VTE Risk Assessment: ?This patient's probability of 30-day post-discharge VTE is increased due to the factors marked: ?X Sleeve gastrectomy  ?X Liver disorder (transplant, cirrhosis, or nonalcoholic steatohepatitis)  ? Hx of VTE  ? Hemorrhage requiring transfusion  ? GI perforation, leak, or obstruction  ? ====================================================  ?  Female  ?  Age >/=60 years  ?  BMI >/=50 kg/m2  ?  CHF  ?  Dyspnea at Rest  ?  Paraplegia  ?x  Non-gastric-band surgery  ?  Operation Time >/=3 hr  ?  Return to OR   ?  Length of Stay >/= 3 d  ? Hypercoagulable condition  ? Significant venous stasis  ? ? ? ?Predicted probability of 30-day post-discharge VTE: 0.16% via calculator + additional (see other patient-specific factors) increased risk for portomesenteric vein thrombosis ? ?Other patient-specific factors to consider: ?Sleeve gastrectomy ?Noted significant h/o hepatic steatosis ?Note LFT's elevated 08/25/21. ? ?Recommendation for Discharge: ?Lovenox 40mg  SQ q 12 hrs for 30 days. ? ? ?[name] Sherman is a 61 y.o. female who underwent  laparoscopic sleeve gastrectomy(procedure) on 09/07/21 ?  ?Case start: 1205  ?Case end: 1325 ? ? ? ? ? ?Allergies  ?Allergen Reactions  ? Keflex [Cephalexin] Swelling  ?  Tongue swelling  ? ? ?Patient Measurements: ?Height: 5\' 5"  (165.1 cm) ?Weight: 121.4 kg (267 lb 9.6 oz) ?IBW/kg (Calculated) : 57 ?Body mass index is 44.53 kg/m?. ? ?No results for input(s): WBC, HGB, HCT, PLT, APTT, CREATININE, LABCREA, CREATININE, CREAT24HRUR, MG, PHOS, ALBUMIN, PROT, ALBUMIN, AST, ALT, ALKPHOS, BILITOT, BILIDIR, IBILI in the last 72 hours. ?Estimated Creatinine Clearance: 88.1 mL/min (by C-G formula based on SCr of 0.91 mg/dL). ? ? ? ?Past Medical History:  ?Diagnosis Date  ? ADD (attention deficit disorder)   ? Anemia   ? Anxiety   ? Arthritis   ? shoulder, right (arthritis,  tendonitis, bursitis)  ? Chronic low back pain   ? Complication of anesthesia   ? Dyspnea   ? Fatty liver   ? GERD (gastroesophageal reflux disease)   ? History of cervical cancer   ? s/p  laser ablation of cervix 1980's  ? History of COVID-19 07/2019  ? History of kidney stones   ? Hyperlipidemia   ? Hypertension   ? Hypothyroidism   ? IBS (irritable bowel syndrome)   ? MDD (major depressive disorder)   ? Migraine   ? OSA (obstructive sleep apnea)   ? pt used cpap up until 2013 states lost wt and did not need anymore  ? PONV (postoperative nausea and vomiting)   ? Restless legs   ? Sleep apnea   ? wears CPAP  ? Type 2 diabetes mellitus ([place])   ? Urgency of urination   ? Vertigo   ? Vitamin D deficiency   ? Vulvar cyst   ? ? ? ?Medications Prior to Admission  ?Medication Sig Dispense Refill Last Dose  ? amphetamine-dextroamphetamine (ADDERALL) 30 MG tablet Take one tablet twice daily. 60 tablet 0 09/06/2021  ? atorvastatin (LIPITOR) 10 MG tablet TAKE 1 TABLET BY MOUTH DAILY (Patient taking differently: Take 10 mg by mouth daily.) 90 tablet 2 09/06/2021  ? Biotin 1000 MCG tablet Take 1,000 mcg by mouth daily.   Past Month  ? citalopram (CELEXA) 20 MG tablet Take 1 tablet (20 mg total) by mouth daily. 90 tablet 3 09/06/2021  ? cyclobenzaprine (FLEXERIL) 10 MG tablet  Take 10 mg by mouth at bedtime.   09/06/2021  ? furosemide (LASIX) 20 MG tablet TAKE 1 TABLET BY MOUTH ONCE A DAY AS NEEDED FOR SWELLING (Patient taking differently: Take 20 mg by mouth daily as needed for fluid.) 30 tablet 1 09/06/2021  ? insulin lispro (HUMALOG) 100 UNIT/ML KwikPen Inject 5 Units into the skin 3 (three) times daily. 15 mL 5 09/06/2021  ? losartan-hydrochlorothiazide (HYZAAR) 100-12.5 MG tablet TAKE 1 TABLET BY MOUTH EVERY DAY (Patient taking differently: Take 1 tablet by mouth daily.) 90 tablet 1 09/06/2021  ? metFORMIN (GLUCOPHAGE) 500 MG tablet Take 1 tablet (500 mg total) by mouth 2 (two) times daily with a meal. 180 tablet 0 09/06/2021  ?  nortriptyline (PAMELOR) 25 MG capsule Take 25 mg by mouth at bedtime.   09/06/2021  ? nystatin cream (MYCOSTATIN) Apply 1 application topically 2 (two) times daily. 30 g 1 Past Week  ? pantoprazole (PROTONIX) 40 MG tablet TAKE 1 TABLET(40 MG) BY MOUTH TWICE DAILY (Patient taking differently: 40 mg 2 (two) times daily.) 180 tablet 1 09/06/2021  ? Probiotic Product (ALIGN PO) Take 1 capsule by mouth daily.   Past Month  ? SYNTHROID 50 MCG tablet TAKE 1 TABLET BY MOUTH EVERY DAY (Patient taking differently: Take 50 mcg by mouth daily before breakfast.) 90 tablet 0 09/06/2021  ? Turmeric-Ginger 135-6 MG CHEW Chew 2 each by mouth daily.   Past Month  ? Vitamin D, Ergocalciferol, (DRISDOL) 1.25 MG (50000 UNIT) CAPS capsule TAKE 1 CAPSULE BY MOUTH 1 TIME A WEEK (Patient taking differently: Take 50,000 Units by mouth every Monday. TAKE 1 CAPSULE BY MOUTH 1 TIME A WEEK) 12 capsule 3 Past Month  ? Accu-Chek Softclix Lancets lancets Use as instructed 100 each 12 supply  ? blood glucose meter kit and supplies Dispense based on patient and insurance preference. Use up to four times daily as directed. (FOR ICD-10 E10.9, E11.9). 1 each 0 supply  ? glucose blood (ACCU-CHEK GUIDE) test strip Use to test blood sugars 3 times a day. 300 each 3 supply  ? ondansetron (ZOFRAN) 8 MG tablet Take 1 tablet (8 mg total) by mouth every 8 (eight) hours as needed for nausea or vomiting. 60 tablet 1 More than a month  ? oxyCODONE-acetaminophen (PERCOCET) 10-325 MG tablet Take 1 tablet by mouth every 8 (eight) hours as needed for pain.   More than a month  ? SUMAtriptan (IMITREX) 100 MG tablet May repeat in 2 hours if headache persists or recurs.  Do not take more than 2 in 24 hours (Patient taking differently: Take 100 mg by mouth every 2 (two) hours as needed for migraine. May repeat in 2 hours if headache persists or recurs.  Do not take more than 2 in 24 hours) 10 tablet 11 More than a month  ? ? ? ? ?Byran Bilotti S. Alford Highland, PharmD, BCPS ?Clinical  Staff Pharmacist ?[email or web address] ?Alford Highland, [employer] ?09/07/2021,2:05 PM ? ?

## 2021-09-07 NOTE — Anesthesia Preprocedure Evaluation (Signed)
Anesthesia Evaluation  ?Patient identified by MRN, date of birth, ID band ?Patient awake ? ? ? ?Reviewed: ?Allergy & Precautions, NPO status , Patient's Chart, lab work & pertinent test results ? ?History of Anesthesia Complications ?Negative for: history of anesthetic complications ? ?Airway ?Mallampati: I ? ?TM Distance: >3 FB ?Neck ROM: Full ? ? ? Dental ? ?(+) Dental Advisory Given, Teeth Intact ?  ?Pulmonary ?sleep apnea and Continuous Positive Airway Pressure Ventilation ,  ?  ?Pulmonary exam normal ? ? ? ? ? ? ? Cardiovascular ?hypertension, Pt. on medications ?Normal cardiovascular exam ? ? ?  ?Neuro/Psych ? Headaches, Anxiety Depression   ? GI/Hepatic ?GERD  ,NASH, elevated LFTs ?  ?Endo/Other  ?diabetes (A1c 12.6), Poorly Controlled, Type 2, Insulin Dependent, Oral Hypoglycemic AgentsHypothyroidism Morbid obesity ? Renal/GU ?negative Renal ROS  ?negative genitourinary ?  ?Musculoskeletal ? ?(+) Arthritis ,  ? Abdominal ?  ?Peds ? Hematology ? ?(+) Blood dyscrasia, anemia ,   ?Anesthesia Other Findings ? ? Reproductive/Obstetrics ? ?  ? ? ? ? ? ? ? ? ? ? ? ? ? ?  ?  ? ? ? ? ? ? ? ?Anesthesia Physical ?Anesthesia Plan ? ?ASA: 3 ? ?Anesthesia Plan: General  ? ?Post-op Pain Management: Toradol IV (intra-op)* and Lidocaine infusion*  ? ?Induction: Intravenous ? ?PONV Risk Score and Plan: 3 and Ondansetron, Dexamethasone, Treatment may vary due to age or medical condition and Midazolam ? ?Airway Management Planned: Oral ETT ? ?Additional Equipment: None ? ?Intra-op Plan:  ? ?Post-operative Plan: Extubation in OR ? ?Informed Consent: I have reviewed the patients History and Physical, chart, labs and discussed the procedure including the risks, benefits and alternatives for the proposed anesthesia with the patient or authorized representative who has indicated his/her understanding and acceptance.  ? ? ? ?Dental advisory given ? ?Plan Discussed with:  ? ?Anesthesia Plan Comments:    ? ? ? ? ? ? ?Anesthesia Quick Evaluation ? ?

## 2021-09-07 NOTE — Interval H&P Note (Signed)
History and Physical Interval Note: ? ?09/07/2021 ?10:56 AM ? ?[name] Sherman  has presented today for surgery, with the diagnosis of morbid obesity.  The various methods of treatment have been discussed with the patient and family. After consideration of risks, benefits and other options for treatment, the patient has consented to  Procedure(s): ?LAPAROSCOPIC GASTRIC SLEEVE RESECTION (N/A) ?UPPER GI ENDOSCOPY (N/A) as a surgical intervention.  The patient's history has been reviewed, patient examined, no change in status, stable for surgery.  I have reviewed the patient's chart and labs.  Questions were answered to the patient's satisfaction.   ? ? ?Leiliana Foody Rich Brave ? ? ?

## 2021-09-07 NOTE — Progress Notes (Signed)

## 2021-09-07 NOTE — Progress Notes (Signed)
Patient arrived to the floor and walked to bathroom then the chair. She has complaints of minimal pain at this time and is successfully reaching up to 1500 on her IS. Patient started her water at @ 1600. ?

## 2021-09-07 NOTE — Anesthesia Postprocedure Evaluation (Signed)
Anesthesia Post Note ? ?Patient: [name] Sherman ? ?Procedure(s) Performed: LAPAROSCOPIC GASTRIC SLEEVE RESECTION (Abdomen) ?UPPER GI ENDOSCOPY (Esophagus) ? ?  ? ?Patient location during evaluation: PACU ?Anesthesia Type: General ?Level of consciousness: awake and alert ?Pain management: pain level controlled ?Vital Signs Assessment: post-procedure vital signs reviewed and stable ?Respiratory status: spontaneous breathing, nonlabored ventilation and respiratory function stable ?Cardiovascular status: blood pressure returned to baseline and stable ?Postop Assessment: no apparent nausea or vomiting ?Anesthetic complications: no ? ? ?No notable events documented. ? ?Last Vitals:  ?Vitals:  ? 09/07/21 1643 09/07/21 1726  ?BP: (!) 153/86 (!) 156/93  ?Pulse: (!) 130 (!) 122  ?Resp:  15  ?Temp: 36.8 ?C 36.9 ?C  ?SpO2: 95% (!) 88%  ?  ?Last Pain:  ?Vitals:  ? 09/07/21 1726  ?TempSrc: Oral  ?PainSc:   ? ? ?  ?  ?  ?  ?  ?  ? ?Lidia Collum ? ? ? ? ?

## 2021-09-08 ENCOUNTER — Encounter (HOSPITAL_COMMUNITY): Payer: Self-pay | Admitting: Surgery

## 2021-09-08 LAB — CBC WITH DIFFERENTIAL/PLATELET
Abs Immature Granulocytes: 0.21 10*3/uL — ABNORMAL HIGH (ref 0.00–0.07)
Basophils Absolute: 0 10*3/uL (ref 0.0–0.1)
Basophils Relative: 0 %
Eosinophils Absolute: 0 10*3/uL (ref 0.0–0.5)
Eosinophils Relative: 0 %
HCT: 36 % (ref 36.0–46.0)
Hemoglobin: 11.5 g/dL — ABNORMAL LOW (ref 12.0–15.0)
Immature Granulocytes: 2 %
Lymphocytes Relative: 15 %
Lymphs Abs: 1.5 10*3/uL (ref 0.7–4.0)
MCH: 26.9 pg (ref 26.0–34.0)
MCHC: 31.9 g/dL (ref 30.0–36.0)
MCV: 84.3 fL (ref 80.0–100.0)
Monocytes Absolute: 0.6 10*3/uL (ref 0.1–1.0)
Monocytes Relative: 6 %
Neutro Abs: 7.8 10*3/uL — ABNORMAL HIGH (ref 1.7–7.7)
Neutrophils Relative %: 77 %
Platelets: 271 10*3/uL (ref 150–400)
RBC: 4.27 MIL/uL (ref 3.87–5.11)
RDW: 13.5 % (ref 11.5–15.5)
WBC: 10.2 10*3/uL (ref 4.0–10.5)
nRBC: 0 % (ref 0.0–0.2)

## 2021-09-08 LAB — MAGNESIUM: Magnesium: 1.6 mg/dL — ABNORMAL LOW (ref 1.7–2.4)

## 2021-09-08 LAB — COMPREHENSIVE METABOLIC PANEL
ALT: 107 U/L — ABNORMAL HIGH (ref 0–44)
AST: 121 U/L — ABNORMAL HIGH (ref 15–41)
Albumin: 3.2 g/dL — ABNORMAL LOW (ref 3.5–5.0)
Alkaline Phosphatase: 143 U/L — ABNORMAL HIGH (ref 38–126)
Anion gap: 9 (ref 5–15)
BUN: 11 mg/dL (ref 6–20)
CO2: 26 mmol/L (ref 22–32)
Calcium: 8.6 mg/dL — ABNORMAL LOW (ref 8.9–10.3)
Chloride: 102 mmol/L (ref 98–111)
Creatinine, Ser: 0.82 mg/dL (ref 0.44–1.00)
GFR, Estimated: >60 mL/min (ref >60)
Glucose, Bld: 223 mg/dL — ABNORMAL HIGH (ref 70–99)
Potassium: 3.7 mmol/L (ref 3.5–5.1)
Sodium: 137 mmol/L (ref 135–145)
Total Bilirubin: 0.3 mg/dL (ref 0.3–1.2)
Total Protein: 6.8 g/dL (ref 6.5–8.1)

## 2021-09-08 LAB — GLUCOSE, CAPILLARY
Glucose-Capillary: 132 mg/dL — ABNORMAL HIGH (ref 70–99)
Glucose-Capillary: 168 mg/dL — ABNORMAL HIGH (ref 70–99)
Glucose-Capillary: 186 mg/dL — ABNORMAL HIGH (ref 70–99)
Glucose-Capillary: 198 mg/dL — ABNORMAL HIGH (ref 70–99)
Glucose-Capillary: 223 mg/dL — ABNORMAL HIGH (ref 70–99)
Glucose-Capillary: 274 mg/dL — ABNORMAL HIGH (ref 70–99)
Glucose-Capillary: 287 mg/dL — ABNORMAL HIGH (ref 70–99)

## 2021-09-08 LAB — SURGICAL PATHOLOGY

## 2021-09-08 MED ORDER — ENOXAPARIN SODIUM 40 MG/0.4ML IJ SOSY: 40.0000 mg | PREFILLED_SYRINGE | Freq: Two times a day (BID) | INTRAMUSCULAR | 0 refills | Status: DC

## 2021-09-08 MED ORDER — ENOXAPARIN (LOVENOX) PATIENT EDUCATION KIT
PACK | Freq: Once | Status: AC
  Filled 2021-09-08: qty 1

## 2021-09-08 MED ORDER — INSULIN ASPART 100 UNIT/ML FLEXPEN: 0.0000 [IU] | PEN_INJECTOR | Freq: Three times a day (TID) | SUBCUTANEOUS | 11 refills | Status: DC

## 2021-09-08 MED ORDER — PANTOPRAZOLE SODIUM 40 MG PO TBEC: 40.0000 mg | DELAYED_RELEASE_TABLET | Freq: Every day | ORAL | 0 refills | Status: DC

## 2021-09-08 MED ORDER — LOSARTAN POTASSIUM 50 MG PO TABS
100.0000 mg | ORAL_TABLET | Freq: Every day | ORAL | Status: DC
  Administered 2021-09-08 – 2021-09-09 (×2): 100 mg via ORAL
  Filled 2021-09-08 (×2): qty 2

## 2021-09-08 MED ORDER — POTASSIUM CHLORIDE CRYS ER 20 MEQ PO TBCR
40.0000 meq | EXTENDED_RELEASE_TABLET | Freq: Once | ORAL | Status: AC
  Administered 2021-09-08: 40 meq via ORAL
  Filled 2021-09-08: qty 2

## 2021-09-08 MED ORDER — GABAPENTIN 100 MG PO CAPS: 200.0000 mg | ORAL_CAPSULE | Freq: Two times a day (BID) | ORAL | 0 refills | Status: DC

## 2021-09-08 MED ORDER — TRAMADOL HCL 50 MG PO TABS: 50.0000 mg | ORAL_TABLET | Freq: Four times a day (QID) | ORAL | 0 refills | Status: DC | PRN

## 2021-09-08 MED ORDER — LOSARTAN POTASSIUM 100 MG PO TABS: 100.0000 mg | ORAL_TABLET | Freq: Every day | ORAL | 0 refills | Status: DC

## 2021-09-08 MED ORDER — INSULIN ASPART 100 UNIT/ML FLEXPEN: 0.0000 [IU] | PEN_INJECTOR | Freq: Three times a day (TID) | SUBCUTANEOUS | 0 refills | Status: DC

## 2021-09-08 MED ORDER — ONDANSETRON 4 MG PO TBDP: 4.0000 mg | ORAL_TABLET | Freq: Four times a day (QID) | ORAL | 0 refills | Status: DC | PRN

## 2021-09-08 MED ORDER — ACETAMINOPHEN 500 MG PO TABS: 1000.0000 mg | ORAL_TABLET | Freq: Three times a day (TID) | ORAL | 0 refills | Status: AC

## 2021-09-08 MED ORDER — MAGNESIUM SULFATE 4 GM/100ML IV SOLN
4.0000 g | Freq: Once | INTRAVENOUS | Status: AC
  Administered 2021-09-08: 4 g via INTRAVENOUS
  Filled 2021-09-08: qty 100

## 2021-09-08 NOTE — Progress Notes (Signed)
Inpatient Diabetes Program Recommendations ? ?AACE/ADA: New Consensus Statement on Inpatient Glycemic Control (2015) ? ?Target Ranges:  Prepandial:   less than 140 mg/dL ?     Peak postprandial:   less than 180 mg/dL (1-2 hours) ?     Critically ill patients:  140 - 180 mg/dL  ? ?Lab Results  ?Component Value Date  ? GLUCAP 198 (H) 09/08/2021  ? HGBA1C 12.6 (H) 08/17/2021  ? ? ?Review of Glycemic Control ? ?Diabetes history: DM2 ?Outpatient Diabetes medications: Tresiba 10 QD, Humalog 5 units TID, metformin 500 mg BID ?Current orders for Inpatient glycemic control: Novolog 0-20 units Q4H ? ?HgbA1C - 12.6% ?PCP is referring pt to Endo ? ?Inpatient Diabetes Program Recommendations:   ? ?For home: ? ?Novolog 0-20 units TID with meals ? ?Would not restart metformin 500 mg BID or Tresiba 10 units QD ? ?Monitor blood sugars at least 4x/day. ? ?Call PCP if blood sugars consistently > 200 mg/dL. ? ?Discussed hypoglycemia s/s and treatment ? ?Answered questions of pt and husband. ? ?Thank you. ?Lorenda Peck, RD, LDN, CDE ?Inpatient Diabetes Coordinator ?[phone]  ? ? ? ? ?

## 2021-09-08 NOTE — Discharge Instructions (Signed)
GASTRIC BYPASS / SLEEVE  ?Home Care Instructions ? ?These instructions are to help you care for yourself when you go home. ? ?Call: If you have any problems. ?Call [phone] and ask for the surgeon on call ?If you have an emergency related to your surgery please use the ER at [place].  ?Tell the ER staff that you are a new post-op gastric bypass or gastric sleeve patient ?  ?Signs and symptoms to report: Severe vomiting or nausea ?If you cannot handle clear liquids for longer than 1 day, call your surgeon  ?Abdominal pain which does not get better after taking your pain medication ?Fever greater than 100.4? F and chills ?Heart rate over 100 beats a minute ?Trouble breathing ?Chest pain ? Redness, swelling, drainage, or foul odor at incision (surgical) sites ? If your incisions open or pull apart ?Swelling or pain in calf (lower leg) ?Diarrhea (Loose bowel movements that happen often), frequent watery, uncontrolled bowel movements ?Constipation, (no bowel movements for 3 days) if this happens:  ?Take Milk of Magnesia, 2 tablespoons by mouth, 3 times a day for 2 days if needed ?Stop taking Milk of Magnesia once you have had a bowel movement ?Call your doctor if constipation continues ?Or ?Take Miralax  (instead of Milk of Magnesia) following the label instructions ?Stop taking Miralax once you have had a bowel movement ?Call your doctor if constipation continues ?Anything you think is ?abnormal for you? ?  ?Normal side effects after surgery: Unable to sleep at night or unable to concentrate ?Irritability ?Being tearful (crying) or depressed ?These are common complaints, possibly related to your anesthesia, stress of surgery and change in lifestyle, that usually go away a few weeks after surgery.  If these feelings continue, call your medical doctor.  ?Wound Care: You may have surgical glue, steri-strips, or staples over your incisions after surgery ?Surgical glue:  Looks like a clear film over your incisions  and will wear off a little at a time ?Steri-strips : Adhesive strips of tape over your incisions. You may notice a yellowish color on the skin under the steri-strips. This is used to make the   steri-strips stick better. Do not pull the steri-strips off - let them fall off ?Staples: Staples may be removed before you leave the hospital ?If you go home with staples, call [facility] at for an appointment with your surgeon?s nurse to have staples removed 10 days after surgery, [phone] ?Showering: You may shower two (2) days after your surgery unless your surgeon tells you differently ?Wash gently around incisions with warm soapy water, rinse well, and gently pat dry  ?If you have a drain (tube from your incision), you may need someone to hold this while you shower  ?No tub baths until staples are removed and incisions are healed   ?  ?Medications: Medications should be liquid or crushed if larger than the size of a dime ?Extended release pills (medication that releases a little bit at a time through the day) should not be crushed ?Depending on the size and number of medications you take, you may need to space (take a few throughout the day)/change the time you take your medications so that you do not over-fill your pouch (smaller stomach) ?Make sure you follow-up with your primary care physician to make medication changes needed during rapid weight loss and life-style changes ?If you have diabetes, follow up with the doctor that orders your diabetes medication(s) within one week after surgery and check   your blood sugar regularly. ?Do not drive while taking narcotics (pain medications) ?DO NOT take NSAID'S (Examples of NSAID's include ibuprofen, naproxen)  ?Diet:                    First 2 Weeks ? You will see the nutritionist about two (2) weeks after your surgery. The nutritionist will increase the types of foods you can eat if you are handling liquids well: ?If you have severe vomiting or nausea  and cannot handle clear liquids lasting longer than 1 day, call your surgeon  ?Protein Shake ?Drink at least 2 ounces of shake 5-6 times per day ?Each serving of protein shakes (usually 8 - 12 ounces) should have a minimum of:  ?15 grams of protein  ?And no more than 5 grams of carbohydrate  ?Goal for protein each day: ?Men = 80 grams per day ?Women = 60 grams per day ?Protein powder may be added to fluids such as non-fat milk or Lactaid milk or Soy milk (limit to 35 grams added protein powder per serving) ? ?Hydration ?Slowly increase the amount of water and other clear liquids as tolerated (See Acceptable Fluids) ?Slowly increase the amount of protein shake as tolerated  ? Sip fluids slowly and throughout the day ?May use sugar substitutes in small amounts (no more than 6 - 8 packets per day; i.e. Splenda) ? ?Fluid Goal ?The first goal is to drink at least 8 ounces of protein shake/drink per day (or as directed by the nutritionist);  See handout from pre-op Bariatric Education Class for examples of protein shake/drink.   ?Slowly increase the amount of protein shake you drink as tolerated ?You may find it easier to slowly sip shakes throughout the day ?It is important to get your proteins in first ?Your fluid goal is to drink 64 - 100 ounces of fluid daily ?It may take a few weeks to build up to this ?32 oz (or more) should be clear liquids  ?And  ?32 oz (or more) should be full liquids (see below for examples) ?Liquids should not contain sugar, caffeine, or carbonation ? ?Clear Liquids: ?Water or Sugar-free flavored water (i.e. Fruit H2O, Propel) ?Decaffeinated coffee or tea (sugar-free) ?[place] Macaraeg Lite, Wyler?s Lite, Minute Maid Lite ?Sugar-free Jell-O ?Bouillon or broth ?Sugar-free Popsicle:   *Less than 20 calories each; Limit 1 per day ? ?Full Liquids: ?Protein Shakes/Drinks + 2 choices per day of other full liquids ?Full liquids must be: ?No More Than 12 grams of Carbs per serving  ?No More Than 3 grams of Fat  per serving ?Strained low-fat cream soup ?Non-Fat milk ?Fat-free Lactaid Milk ?Sugar-free yogurt (Dannon Lite & Fit, Greek yogurt) ? ? ? ?  ?Vitamins and Minerals Start 1 day after surgery unless otherwise directed by your surgeon ?Bariatric Specific Complete Multivitamins ?Chewable Calcium Citrate with Vitamin D-3 ?(Example: 3 Chewable Calcium Plus 600 with Vitamin D-3) ?Take 500 mg three (3) times a day for a total of 1500 mg each day ?Do not take all 3 doses of calcium at one time as it may cause constipation, and you can only absorb 500 mg  at a time  ?Do not mix multivitamins containing iron with calcium supplements; take 2 hours apart ? ?Menstruating women and those at risk for anemia (a blood disease that causes weakness) may need extra iron ?Talk with your doctor to see if you need more iron ?If you need extra iron: Total daily Iron recommendation (including Vitamins) is 50 to 100   mg Iron/day ?Do not stop taking or change any vitamins or minerals until you talk to your nutritionist or surgeon ?Your nutritionist and/or surgeon must approve all vitamin and mineral supplements ?  ?Activity and Exercise: It is important to continue walking at home.  Limit your physical activity as instructed by your doctor.  During this time, use these guidelines: ?Do not lift anything greater than ten (10) pounds for at least two (2) weeks ?Do not go back to work or drive until your surgeon says you can ?You may have sex when you feel comfortable  ?It is VERY important for female patients to use a reliable birth control method; fertility often increases after surgery  ?Do not get pregnant for at least 18 months ?Start exercising as soon as your doctor tells you that you can ?Make sure your doctor approves any physical activity ?Start with a simple walking program ?Walk 5-15 minutes each day, 7 days per week.  ?Slowly increase until you are walking 30-45 minutes per day ?Consider joining our BELT program. [phone] or email  [email or web address] ?  ?Special Instructions Things to remember: ? ?Use your CPAP when sleeping if this applies to you, do not stop the use of CPAP unless directed by physician after a sleep study ?[place]

## 2021-09-08 NOTE — Progress Notes (Signed)
S: Had a decent night, tolerating liquids without nausea or dysphagia, but notes intermittently fairly severe pain in a band across her upper abdomen. ? ?O: ?Vitals, labs, intake/output, and orders reviewed at this time. Afebrile, tachycardic (her baseline heart rate at all of her most recent outpatient visits has been in the 110s to 130s), hypertensive, saturating well on room air.  P.o. 420, urine output 2200.Labs notable for mild hypokalemia, hypomagnesemia, increased LFTs consistent with known fatty liver disease after being retracted, WBC 10.2, hemoglobin is 11.5 from 14.2 preop although in October of last year it was 12.4 ? ? ?Gen: A&Ox3, no distress  ?H&N: EOMI, atraumatic, neck supple ?Chest: unlabored respirations, RRR ?Abd: soft, nontender, nondistended, incision(s) c/d/i without cellulitis, hematoma or ecchymosis ?Ext: warm, no edema ?Neuro: grossly normal ? ?Lines/tubes/drains: PIV ? ?A/P: POD 1 s/p sleeve gastrectomy. Generally doing well but working on pain control. Tolerating PO well and walking/ using IS.  ?-Replace potassium/ magnesium  ?-Recheck hemoglobin tomorrow morning- no clinical concern for active bleeding ?-Continue liquids/ protein shakes as able.  ?-Plan lovenox on DC and appreciate DM coordinator recs ? ?Anticipate discharge home tomorrow if continuing to improve.  ? ? ?Romana Juniper, MD FACS ?[facility] ? ?  ?

## 2021-09-08 NOTE — Progress Notes (Signed)
Patient alert and oriented, pain is controlled. Patient is tolerating fluids, advanced to protein shake today, patient is tolerating well.  Reviewed Gastric sleeve discharge instructions with patient and patient is able to articulate understanding.  Provided information on BELT program, Support Group and [facility] outpatient pharmacy. All questions answered, will continue to monitor.  ? ?Reviewed Lovenox teaching kit and pt reviewed Lovenox "Patient-Self Injection Video".   ?

## 2021-09-08 NOTE — Progress Notes (Signed)
Transition of Care (TOC) Screening Note ? ?Patient Details  ?Name: [name] Sherman ?Date of Birth: April 20, 1961 ? ?Transition of Care (TOC) CM/SW Contact:    ?Sherie Don, LCSW ?Phone Number: ?09/08/2021, 11:33 AM ? ?Transition of Care Department [facility]) has reviewed patient and no TOC needs have been identified at this time. We will continue to monitor patient advancement through interdisciplinary progression rounds. If new patient transition needs arise, please place a TOC consult. ?

## 2021-09-09 LAB — CBC
HCT: 36.9 % (ref 36.0–46.0)
Hemoglobin: 11.4 g/dL — ABNORMAL LOW (ref 12.0–15.0)
MCH: 26.5 pg (ref 26.0–34.0)
MCHC: 30.9 g/dL (ref 30.0–36.0)
MCV: 85.6 fL (ref 80.0–100.0)
Platelets: 271 10*3/uL (ref 150–400)
RBC: 4.31 MIL/uL (ref 3.87–5.11)
RDW: 13.7 % (ref 11.5–15.5)
WBC: 11 10*3/uL — ABNORMAL HIGH (ref 4.0–10.5)
nRBC: 0 % (ref 0.0–0.2)

## 2021-09-09 LAB — GLUCOSE, CAPILLARY
Glucose-Capillary: 117 mg/dL — ABNORMAL HIGH (ref 70–99)
Glucose-Capillary: 122 mg/dL — ABNORMAL HIGH (ref 70–99)
Glucose-Capillary: 134 mg/dL — ABNORMAL HIGH (ref 70–99)
Glucose-Capillary: 159 mg/dL — ABNORMAL HIGH (ref 70–99)

## 2021-09-09 NOTE — Plan of Care (Signed)
?  Problem: Education: ?Goal: Ability to state signs and symptoms to report to health care provider will improve ?Outcome: Completed/Met ?Goal: Knowledge of the prescribed self-care regimen will improve ?Outcome: Completed/Met ?Goal: Knowledge of discharge needs will improve ?Outcome: Completed/Met ?  ?Problem: Activity: ?Goal: Ability to tolerate increased activity will improve ?Outcome: Completed/Met ?  ?Problem: Bowel/Gastric: ?Goal: Gastrointestinal status for postoperative course will improve ?Outcome: Completed/Met ?Goal: Occurrences of nausea will decrease ?Outcome: Completed/Met ?  ?Problem: Coping: ?Goal: Development of coping mechanisms to deal with changes in body function or appearance will improve ?Outcome: Completed/Met ?  ?Problem: Fluid Volume: ?Goal: Maintenance of adequate hydration will improve ?Outcome: Completed/Met ?  ?Problem: Nutritional: ?Goal: Nutritional status will improve ?Outcome: Completed/Met ?  ?Problem: Clinical Measurements: ?Goal: Will show no signs or symptoms of venous thromboembolism ?Outcome: Completed/Met ?Goal: Will remain free from infection ?Outcome: Completed/Met ?Goal: Will show no signs of GI Leak ?Outcome: Completed/Met ?  ?Problem: Respiratory: ?Goal: Will regain and/or maintain adequate ventilation ?Outcome: Completed/Met ?  ?Problem: Pain Management: ?Goal: Pain level will decrease ?Outcome: Completed/Met ?  ?Problem: Skin Integrity: ?Goal: Demonstration of wound healing without infection will improve ?Outcome: Completed/Met ?  ?

## 2021-09-09 NOTE — Discharge Summary (Signed)
Physician Discharge Summary  ?[name] Sherman KGU:[id number] DOB: 07-Nov-1960 DOA: 09/07/2021 ? ?PCP: [name] Post, MD ? ?Admit date: 09/07/2021 ?Discharge date: 09/09/2021  ? ?Recommendations for Outpatient Follow-up:  ? ? Follow-up Information   ? ? [name] Riley, MD. [name] Sherman on 09/29/2021.   ?Specialty: General Surgery ?Why: at 3pm.  Please arrive 15 minutes prior to your appointment time. Thank you. ?Contact information: ?[address] ?[address] ?[place] [address] ?[phone] ? ? ?  ?  ? ? [name] Riley, MD. [name] Sherman on 10/27/2021.   ?Specialty: General Surgery ?Why: at 9:20am.  Please arrive 15 minutes prior to your appointment time.  Thank you. ?Contact information: ?[address] ?[address] ?[place] [address] ?[phone] ? ? ?  ?  ? ? [name] Post, MD Follow up in 1 week(s).   ?Specialty: Family Medicine ?Why: Follow-up with your primary care doctor within 1 to 2 weeks after surgery to titrate your diabetes medications and blood pressure medications.  Being a log of your blood sugars and your blood pressures/heart rate. ?Contact information: ?[address] ?[place] [address] ?[phone] ? ? ?  ?  ? ?  ?  ? ?  ? ?Discharge Diagnoses:  ?Principal Problem: ?  Morbid obesity ([place]) ? ? ?Surgical Procedure: Laparoscopic Sleeve Gastrectomy, upper endoscopy ? ?Discharge Condition: Good ?Disposition: Home ? ?Diet recommendation: Postoperative sleeve gastrectomy diet (liquids only) ? ?Filed Weights  ? 09/07/21 0959  ?Weight: 121.4 kg  ? ? ? ?Hospital Course:  ?The patient was admitted for a planned laparoscopic sleeve gastrectomy. Please see operative note. Preoperatively the patient was given 5000 units of subcutaneous heparin for DVT prophylaxis. Postoperative prophylactic Lovenox dosing was started on the evening of postoperative day 0. ERAS protocol was used. On the evening of postoperative day 0, the patient was started on water and ice chips.  She was able to progress  to water and clear liquids and protein shakes without significant nausea or difficulty.  She did have some ongoing upper abdominal pain which improved by postop day 2.  She was ambulating independently.  Her lab work and vital signs were stable. The patient had received discharge instructions and counseling. They were deemed stable for discharge and had met discharge criteria ? ? ?Discharge Instructions ? ? ?Allergies as of 09/09/2021   ? ?   Reactions  ? Keflex [cephalexin] Swelling  ? Tongue swelling  ? ?  ? ?  ?Medication List  ?  ? ?STOP taking these medications   ? ?Biotin 1000 MCG tablet ?  ?insulin lispro 100 UNIT/ML KwikPen ?Commonly known as: HUMALOG ?  ?losartan-hydrochlorothiazide 100-12.5 MG tablet ?Commonly known as: HYZAAR ?  ?metFORMIN 500 MG tablet ?Commonly known as: GLUCOPHAGE ?  ?ondansetron 8 MG tablet ?Commonly known as: ZOFRAN ?  ?Vitamin D (Ergocalciferol) 1.25 MG (50000 UNIT) Caps capsule ?Commonly known as: DRISDOL ?  ? ?  ? ?TAKE these medications   ? ?Accu-Chek Guide test strip ?Generic drug: glucose blood ?Use to test blood sugars 3 times a day. ?  ?Accu-Chek Softclix Lancets lancets ?Use as instructed ?  ?acetaminophen 500 MG tablet ?Commonly known as: TYLENOL ?Take 2 tablets (1,000 mg total) by mouth every 8 (eight) hours for 5 days. ?  ?ALIGN PO ?Take 1 capsule by mouth daily. ?  ?amphetamine-dextroamphetamine 30 MG tablet ?Commonly known as: Adderall ?Take one tablet twice daily. ?  ?atorvastatin 10 MG tablet ?Commonly known as: LIPITOR ?TAKE 1 TABLET BY MOUTH DAILY ?  ?  blood glucose meter kit and supplies ?Dispense based on patient and insurance preference. Use up to four times daily as directed. (FOR ICD-10 E10.9, E11.9). ?  ?citalopram 20 MG tablet ?Commonly known as: CELEXA ?Take 1 tablet (20 mg total) by mouth daily. ?  ?cyclobenzaprine 10 MG tablet ?Commonly known as: FLEXERIL ?Take 10 mg by mouth at bedtime. ?  ?enoxaparin 40 MG/0.4ML injection ?Commonly known as:  LOVENOX ?Inject 0.4 mLs (40 mg total) into the skin every 12 (twelve) hours. ?  ?furosemide 20 MG tablet ?Commonly known as: LASIX ?TAKE 1 TABLET BY MOUTH ONCE A DAY AS NEEDED FOR SWELLING ?What changed: See the new instructions. ?  ?gabapentin 100 MG capsule ?Commonly known as: NEURONTIN ?Take 2 capsules (200 mg total) by mouth every 12 (twelve) hours. ?  ?insulin aspart 100 UNIT/ML FlexPen ?Commonly known as: NOVOLOG ?Inject 0-20 Units into the skin 3 (three) times daily with meals. Keep a log of your blood sugars and follow-up with your primary care doctor or endocrinologist, once established to further titrate. Monitor blood sugars at least 4x/day. Call PCP if blood sugars consistently > 200 mg/dL ?  ?losartan 100 MG tablet ?Commonly known as: COZAAR ?Take 1 tablet (100 mg total) by mouth daily. Keep a log of your blood pressure and heart rate and follow-up with your primary care provider.  Blood pressure medication doses may change rapidly after weight loss surgery. ?  ?nortriptyline 25 MG capsule ?Commonly known as: PAMELOR ?Take 25 mg by mouth at bedtime. ?  ?nystatin cream ?Commonly known as: MYCOSTATIN ?Apply 1 application topically 2 (two) times daily. ?  ?ondansetron 4 MG disintegrating tablet ?Commonly known as: ZOFRAN-ODT ?Take 1 tablet (4 mg total) by mouth every 6 (six) hours as needed for nausea or vomiting. ?  ?oxyCODONE-acetaminophen 10-325 MG tablet ?Commonly known as: PERCOCET ?Take 1 tablet by mouth every 8 (eight) hours as needed for pain. ?  ?pantoprazole 40 MG tablet ?Commonly known as: PROTONIX ?Take 1 tablet (40 mg total) by mouth daily. ?What changed:  ?how much to take ?how to take this ?when to take this ?additional instructions ?  ?SUMAtriptan 100 MG tablet ?Commonly known as: IMITREX ?May repeat in 2 hours if headache persists or recurs.  Do not take more than 2 in 24 hours ?What changed:  ?how much to take ?how to take this ?when to take this ?reasons to take this ?  ?Synthroid 50 MCG  tablet ?Generic drug: levothyroxine ?TAKE 1 TABLET BY MOUTH EVERY DAY ?What changed:  ?how much to take ?when to take this ?  ?traMADol 50 MG tablet ?Commonly known as: ULTRAM ?Take 1 tablet (50 mg total) by mouth every 6 (six) hours as needed (pain). ?  ?Turmeric-Ginger 135-6 MG Chew ?Chew 2 each by mouth daily. ?  ? ?  ? ? Follow-up Information   ? ? [name] Riley, MD. [name] Sherman on 09/29/2021.   ?Specialty: General Surgery ?Why: at 3pm.  Please arrive 15 minutes prior to your appointment time. Thank you. ?Contact information: ?[address] ?[address] ?[place] [address] ?[phone] ? ? ?  ?  ? ? [name] Riley, MD. [name] Sherman on 10/27/2021.   ?Specialty: General Surgery ?Why: at 9:20am.  Please arrive 15 minutes prior to your appointment time.  Thank you. ?Contact information: ?[address] ?[address] ?[place] [address] ?[phone] ? ? ?  ?  ? ? [name] Post, MD Follow up in 1 week(s).   ?Specialty: Family Medicine ?Why:  Follow-up with your primary care doctor within 1 to 2 weeks after surgery to titrate your diabetes medications and blood pressure medications.  Being a log of your blood sugars and your blood pressures/heart rate. ?Contact information: ?[address] ?[place] [address] ?[phone] ? ? ?  ?  ? ?  ?  ? ?  ? ? ? ?The results of significant diagnostics from this hospitalization (including imaging, microbiology, ancillary and laboratory) are listed below for reference.   ? ?Significant Diagnostic Studies: ?No results found. ? ?Labs: ?Basic Metabolic Panel: ?Recent Labs  ?Lab 09/08/21 ?0406  ?NA 137  ?K 3.7  ?CL 102  ?CO2 26  ?GLUCOSE 223*  ?BUN 11  ?CREATININE 0.82  ?CALCIUM 8.6*  ?MG 1.6*  ? ?Liver Function Tests: ?Recent Labs  ?Lab 09/08/21 ?0406  ?AST 121*  ?ALT 107*  ?ALKPHOS 143*  ?BILITOT 0.3  ?PROT 6.8  ?ALBUMIN 3.2*  ? ? ?CBC: ?Recent Labs  ?Lab 09/08/21 ?0406 09/09/21 ?0420  ?WBC 10.2 11.0*  ?NEUTROABS 7.8*  --   ?HGB 11.5* 11.4*  ?HCT 36.0 36.9  ?MCV  84.3 85.6  ?PLT 271 271  ? ? ?CBG: ?Recent Labs  ?Lab 09/08/21 ?2040 09/09/21 ?4536 09/09/21 ?4680 09/09/21 ?0715 09/09/21 ?1156  ?GLUCAP 132* 159* 122* 117* 134*  ? ? ?Principal Problem: ?  Morbid obesity ([place]) ? ? ?

## 2021-09-10 ENCOUNTER — Telehealth (HOSPITAL_COMMUNITY): Payer: Self-pay | Admitting: *Deleted

## 2021-09-10 NOTE — Telephone Encounter (Signed)
1.  Tell me about your pain and pain management? ?Pt states that she has been experiencing some intermittent abdominal cramping while drinking pre made protein shakes that lasts for a few seconds and then ?goes away?.  Pt states that ?you can just feel it going down?.  Pt can tolerate water.  Pt instructed to call [facility] if pain worsens. Husband is going to purchase protein powder for the patient.  ? ?2.  Let's talk about fluid intake.  How much total fluid are you taking in? ?Pt states that she is working to meet goal of 64 oz of fluid today.  Pt has been able to consume approx. 40-45oz of fluid per day since surgery.  Pt plans to increase clear liquids and protein to meet fluid goals. Pt instructed to assess status and suggestions daily utilizing Hydration Action Plan on discharge folder and to call [facility] if in the "red zone".  ? ?3.  How much protein have you taken in the last 2 days? ?Pt states that she has not been able to meet goal of goal of 60g of protein today.  Pre-made shakes are making her stomach cramp and spasm.  Husband is going to purchase protein powder for patient to try. Pt plans to drink remainder of protein throughout the rest of the day to meet goal. ? ?4.  Have you had nausea?  Tell me about when have experienced nausea and what you did to help? ?Pt states the pre made shakes were making her nauseated and vomit once.  Took Zofran and it helped with the nausea. ?  ?5.  Has the frequency or color changed with your urine? ?Pt states that she is urinating "some" but has to take fluid pill for swelling in her feet. ?  ?6.  Tell me what your incisions look like? ?"Incisions look fine". Pt states that she had a temp of 100.5 and did her I.S. and it came down to 97.2.  Pt did not take any tylenol.  Pt stated that temp was 97.7 this morning. Instructed pt to call office if develops another temp >100.4. ? Pt states incisions are not swollen, open, or draining.  Pt encouraged to call [facility] if incisions  change. ?  ?7.  Have you been passing gas? BM? ?Pt states that she is having BMs. Last BM 09/09/21.   ?  ?8.  If a problem or question were to arise who would you call?  Do you know contact numbers for [facility], [facility], and [facility]? ?Pt denies dehydration symptoms.  Pt can describe s/sx of dehydration.  Pt knows to call [facility] for surgical, [facility] for nutrition, and [facility] for non-urgent questions or concerns. ?  ?9.  How has the walking going? ?Pt states she is walking around and able to be active as much as she can. ?  ?10. Are you still using your incentive spirometer?  If so, how often? ?Pt states that she is doing the I.S. frequently and keeps it "right beside her". Pt encouraged to use incentive spirometer, at least 10x every hour while awake until she sees the surgeon. ? ?11.  How are your vitamins and calcium going?  How are you taking them? ?Pt states that she will begin the supplements tomorrow.  Reinforced education about taking supplements at least two hours apart. ? ?Pt states that she is taking the Lovenox injections without much difficulty.  Will f/u with Dr. [name] to verify the duration of injections needed. ? ?Reminded patient that the first  30 days post-operatively are important for successful recovery.  Practice good hand hygiene, wearing a mask when appropriate (since optional in most places), and minimizing exposure to people who live outside of the home, especially if they are exhibiting any respiratory, GI, or illness-like symptoms.   ? ?

## 2021-09-10 NOTE — Progress Notes (Signed)
24hr fluid recall prior to discharge: 660mL. Per dehydration protocol, will f/u with pt within one week post op. 

## 2021-09-10 NOTE — Telephone Encounter (Signed)
Spoke with Dr. [name] in regards to pt's Lovenox and one time temp of 100.5.  No new orders given.  Dr. [name] to resend prescription for remainder of Lovenox on Monday.  Pt has enough Lovenox until Monday. Pt instructed to call office if develops another temp >100.4 per discharge instructions. Will continue to f/u as needed. ?

## 2021-09-13 ENCOUNTER — Other Ambulatory Visit (HOSPITAL_COMMUNITY): Payer: Self-pay

## 2021-09-13 MED ORDER — ENOXAPARIN SODIUM 40 MG/0.4ML IJ SOSY
PREFILLED_SYRINGE | INTRAMUSCULAR | 0 refills | Status: DC
  Filled 2021-09-13: qty 12, 6d supply, fill #0
  Filled 2021-09-13: qty 20, 25d supply, fill #0

## 2021-09-15 ENCOUNTER — Telehealth: Payer: Self-pay

## 2021-09-15 NOTE — Telephone Encounter (Signed)
F/u has been scheduled ?

## 2021-09-15 NOTE — Telephone Encounter (Signed)
-----   Message from [name], MD sent at 09/15/2021  4:14 PM EDT ----- ?This patient does need office follow up soon to reassess diabetes   ?----- Message ----- ?From: Molli Posey, MD ?Sent: 09/15/2021   3:54 PM EDT ?To: [name], MD ? ? ?

## 2021-09-17 ENCOUNTER — Other Ambulatory Visit: Payer: Self-pay | Admitting: Family Medicine

## 2021-09-21 ENCOUNTER — Encounter: Payer: Medicare Other | Attending: Surgery | Admitting: Skilled Nursing Facility1

## 2021-09-21 ENCOUNTER — Encounter: Payer: Self-pay | Admitting: Family Medicine

## 2021-09-21 ENCOUNTER — Ambulatory Visit (INDEPENDENT_AMBULATORY_CARE_PROVIDER_SITE_OTHER): Payer: Medicare Other | Admitting: Family Medicine

## 2021-09-21 ENCOUNTER — Other Ambulatory Visit: Payer: Self-pay

## 2021-09-21 VITALS — BP 122/76 | HR 75 | Temp 98.1°F | Ht 65.0 in | Wt 252.7 lb

## 2021-09-21 DIAGNOSIS — F988 Other specified behavioral and emotional disorders with onset usually occurring in childhood and adolescence: Secondary | ICD-10-CM

## 2021-09-21 DIAGNOSIS — I10 Essential (primary) hypertension: Secondary | ICD-10-CM

## 2021-09-21 DIAGNOSIS — E1165 Type 2 diabetes mellitus with hyperglycemia: Secondary | ICD-10-CM | POA: Diagnosis present

## 2021-09-21 DIAGNOSIS — E039 Hypothyroidism, unspecified: Secondary | ICD-10-CM

## 2021-09-21 MED ORDER — AMPHETAMINE-DEXTROAMPHETAMINE 30 MG PO TABS: ORAL_TABLET | ORAL | 0 refills | Status: DC

## 2021-09-21 MED ORDER — LEVOTHYROXINE SODIUM 50 MCG PO TABS
50.0000 ug | ORAL_TABLET | Freq: Every day | ORAL | 3 refills | Status: DC
Start: 2021-09-21 — End: 2022-02-18

## 2021-09-21 NOTE — Patient Instructions (Signed)
Watch for any dizziness from low blood pressure as weight loss continues ? ?Avoid regular use of any NSAIDS   ?

## 2021-09-21 NOTE — Progress Notes (Signed)
? ?Established Patient Office Visit ? ?Subjective:  ?Patient ID: [name] Sherman, female    DOB: 21-Jun-1961  Age: 61 y.o. MRN: [id number] ? ?CC:  ?Chief Complaint  ?Patient presents with  ? Follow-up  ? ? ?HPI ?[name] Sherman presents for medical follow-up.  She underwent recent laparoscopic sleeve gastrectomy and did well.  She had tremendously elevated blood sugars a few weeks prior to her surgery and we have started her on some insulin.  She is already lost about 15 pounds and is down to just 10 units of NovoLog insulin daily.  She is checking her blood sugars twice daily.  Sugars have been mostly low 100s.  Already feeling much better overall.  Recent A1c over 12.  She had been very well controlled on Ozempic but then when there were supply issues and she went off her blood sugar skyrocketed. ? ?There were some changes made in her blood pressure medication.  She is currently on losartan 100 mg daily.  No orthostatic symptoms.  She is aware that her blood pressures will likely start to decline as she loses further weight. ? ?She has hypothyroidism and needs refills of her Synthroid.  Last TSH was over 6.  She is also requesting refills of Adderall.  She has had some expected soreness in the abdomen from her surgery but overall doing well.  She has been taking nutritional supplements as instructed. ? ?Past Medical History:  ?Diagnosis Date  ? ADD (attention deficit disorder)   ? Anemia   ? Anxiety   ? Arthritis   ? shoulder, right (arthritis, tendonitis, bursitis)  ? Chronic low back pain   ? Complication of anesthesia   ? Dyspnea   ? Fatty liver   ? GERD (gastroesophageal reflux disease)   ? History of cervical cancer   ? s/p  laser ablation of cervix 1980's  ? History of COVID-19 07/2019  ? History of kidney stones   ? Hyperlipidemia   ? Hypertension   ? Hypothyroidism   ? IBS (irritable bowel syndrome)   ? MDD (major depressive disorder)   ? Migraine   ? OSA (obstructive sleep apnea)   ? pt used cpap up until  2013 states lost wt and did not need anymore  ? PONV (postoperative nausea and vomiting)   ? Restless legs   ? Sleep apnea   ? wears CPAP  ? Type 2 diabetes mellitus ([place])   ? Urgency of urination   ? Vertigo   ? Vitamin D deficiency   ? Vulvar cyst   ? ? ?Past Surgical History:  ?Procedure Laterality Date  ? BUNIONECTOMY Right 2004  ? COLONOSCOPY    ? CYSTOSCOPY W/ RETROGRADES Bilateral 05/26/2015  ? Procedure: CYSTOSCOPY WITH RETROGRADE PYELOGRAM;  Surgeon: Festus Aloe, MD;  Location: [facility];  Service: Urology;  Laterality: Bilateral;  ? CYSTOSCOPY W/ URETERAL STENT REMOVAL Left 05/26/2015  ? Procedure: CYSTOSCOPY WITH STENT REMOVAL;  Surgeon: Festus Aloe, MD;  Location: [facility];  Service: Urology;  Laterality: Left;  ? CYSTOSCOPY WITH RETROGRADE PYELOGRAM, URETEROSCOPY AND STENT PLACEMENT Left 05/01/2015  ? Procedure: CYSTOSCOPY WITH RETROGRADE PYELOGRAM AND STENT PLACEMENT;  Surgeon: Festus Aloe, MD;  Location: [facility];  Service: Urology;  Laterality: Left;  ? CYSTOSCOPY WITH STENT PLACEMENT Bilateral 05/26/2015  ? Procedure: CYSTOSCOPY WITH STENT PLACEMENT;  Surgeon: Festus Aloe, MD;  Location[facility];  Service: Urology;  Laterality: Bilateral;  ? CYSTOSCOPY WITH URETEROSCOPY Bilateral 05/26/2015  ?  Procedure: CYSTOSCOPY WITH URETEROSCOPY;  Surgeon: Festus Aloe, MD;  Location: [facility];  Service: Urology;  Laterality: Bilateral;  ? CYSTOSCOPY/URETEROSCOPY/HOLMIUM LASER/STENT PLACEMENT Right 06/09/2015  ? Procedure: CYSTOSCOPY/URETEROSCOPY/STENT PLACEMENT REMOVAL LEFT URETERAL STENT;  Surgeon: Festus Aloe, MD;  Location: [facility];  Service: Urology;  Laterality: Right;  ? DIAGNOSTIC LAPAROSCOPY    ? HOLMIUM LASER APPLICATION Left 78/24/2353  ? Procedure: HOLMIUM LASER APPLICATION;  Surgeon: Festus Aloe, MD;  Location: [facility];  Service: Urology;  Laterality: Left;  ? HOLMIUM LASER  APPLICATION Right 61/44/3154  ? Procedure: HOLMIUM LASER APPLICATION;  Surgeon: Festus Aloe, MD;  Location: [facility];  Service: Urology;  Laterality: Right;  ? INSERTION OF MESH N/A 09/05/2016  ? Procedure: INSERTION OF MESH;  Surgeon: [name], MD;  Location: [place];  Service: General;  Laterality: N/A;  ? LAPAROSCOPIC ASSISTED VAGINAL HYSTERECTOMY  04/23/2002  ? $'@WH'P$   by Dr Willis Modena;  With Left Salpingoophorectomy/  Anterior Repair/  Tension Free Tape Sling placement  ? LAPAROSCOPIC GASTRIC SLEEVE RESECTION N/A 09/07/2021  ? Procedure: LAPAROSCOPIC GASTRIC SLEEVE RESECTION;  Surgeon: [name], MD;  Location: [facility];  Service: General;  Laterality: N/A;  ? LASER ABLATION OF THE CERVIX  x2  1980's  ? POSTERIOR LAMINECTOMY / DECOMPRESSION LUMBAR SPINE  02/03/2020  ? $'@MC'X$   Dr Ellene Route;   L4--5 and fusion  ? POSTERIOR LUMBAR FUSION  01/21/2019  ? $'@MC'D$  by Dr Ellene Route;   L5--S1  ? TUBAL LIGATION  1980's  ? UPPER GI ENDOSCOPY N/A 09/07/2021  ? Procedure: UPPER GI ENDOSCOPY;  Surgeon: [name], MD;  Location: [facility];  Service: General;  Laterality: N/A;  ? VENTRAL HERNIA REPAIR N/A 09/05/2016  ? Procedure: LAPAROSCOPIC VENTRAL HERNIA;  Surgeon: [name], MD;  Location: [place];  Service: General;  Laterality: N/A;  ? ? ?Family History  ?Problem Relation Age of Onset  ? Alcohol abuse Mother   ? Lung cancer Mother   ? Colon cancer Mother   ? Arthritis Father   ? Hyperlipidemia Father   ? Heart disease Father   ? Hypertension Father   ? COPD Father   ? Kidney disease Paternal Uncle   ? Cancer Maternal Grandmother   ?     colon  ? Colon cancer Maternal Grandmother   ? Rectal cancer Maternal Grandmother   ? Cancer Paternal Grandmother   ?     breast  ? Kidney cancer Sister   ?     lung cancer  ? Cirrhosis Sister   ? Esophageal cancer Neg Hx   ? Stomach cancer Neg Hx   ? ? ?Social History  ? ?Socioeconomic History  ? Marital status: Married  ?  Spouse name: Not on file  ? Number of children: Not on file   ? Years of education: Not on file  ? Highest education level: Not on file  ?Occupational History  ? Not on file  ?Tobacco Use  ? Smoking status: Never  ? Smokeless tobacco: Never  ?Vaping Use  ? Vaping Use: Never used  ?Substance and Sexual Activity  ? Alcohol use: Yes  ?  Comment: socially  ? Drug use: Never  ? Sexual activity: Yes  ?Other Topics Concern  ? Not on file  ?Social History Narrative  ? Not on file  ? ?Social Determinants of Health  ? ?Financial Resource Strain: Low Risk   ? Difficulty of Paying Living Expenses: Not hard at all  ?Food Insecurity:  No Food Insecurity  ? Worried About Charity fundraiser in the Last Year: Never true  ? Ran Out of Food in the Last Year: Never true  ?Transportation Needs: No Transportation Needs  ? Lack of Transportation (Medical): No  ? Lack of Transportation (Non-Medical): No  ?Physical Activity: Sufficiently Active  ? Days of Exercise per Week: 7 days  ? Minutes of Exercise per Session: 30 min  ?Stress: No Stress Concern Present  ? Feeling of Stress : Only a little  ?Social Connections: Not on file  ?Intimate Partner Violence: Not on file  ? ? ?Outpatient Medications Prior to Visit  ?Medication Sig Dispense Refill  ? Accu-Chek Softclix Lancets lancets Use as instructed 100 each 12  ? atorvastatin (LIPITOR) 10 MG tablet TAKE 1 TABLET BY MOUTH DAILY (Patient taking differently: Take 10 mg by mouth daily.) 90 tablet 2  ? blood glucose meter kit and supplies Dispense based on patient and insurance preference. Use up to four times daily as directed. (FOR ICD-10 E10.9, E11.9). 1 each 0  ? citalopram (CELEXA) 20 MG tablet Take 1 tablet (20 mg total) by mouth daily. 90 tablet 3  ? cyclobenzaprine (FLEXERIL) 10 MG tablet Take 10 mg by mouth at bedtime.    ? enoxaparin (LOVENOX) 40 MG/0.4ML injection Inject 0.4 mLs (40 mg total) into the skin every 12 (twelve) hours. 24 mL 0  ? enoxaparin (LOVENOX) 40 MG/0.4ML injection Inject 0.4 mLs (40 mg total) under the skin every 12 (twelve)  hours for 25 days 20 mL 0  ? furosemide (LASIX) 20 MG tablet TAKE 1 TABLET BY MOUTH ONCE A DAY AS NEEDED FOR SWELLING (Patient taking differently: Take 20 mg by mouth daily as needed for fluid.) 30 tablet

## 2021-09-22 ENCOUNTER — Encounter: Payer: Self-pay | Admitting: Family Medicine

## 2021-09-22 MED ORDER — AMPHETAMINE-DEXTROAMPHETAMINE 30 MG PO TABS: ORAL_TABLET | ORAL | 0 refills | Status: DC

## 2021-09-22 NOTE — Progress Notes (Signed)
2 Week Post-Operative Nutrition Class ?  ?Patient was seen on 09/21/2021 for Post-Operative Nutrition education at the [facility].  ?  ?Surgery date: 09/07/2021 ?Surgery type: laparoscopic ? ?Next Steps  ?Patient is to return to [facility] for pre-op class gastric sleeve resection ?Start weight at [facility]: 274.1 ?Weight today: 251.5 ?Bowel Habits: Every day to every other day no complaints ?  ?Body Composition Scale Date ?09/21/2021  ?Current Body Weight 251.5  ?Total Body Fat % 46.9  ?Visceral Fat 19  ?Fat-Free Mass % 53.0  ? Total Body Water % 41.0  ?Muscle-Mass lbs 30  ?BMI 43.1  ?Body Fat Displacement   ?       Torso  lbs 73.2  ?       Left Leg  lbs 14.6  ?       Right Leg  lbs 14.6  ?       Left Arm  lbs 7.3  ?       Right Arm   lbs 7.3  ? ? ?  ?The following the learning objectives were met by the patient during this course: ?Identifies Phase 3 (Soft, High Proteins) Dietary Goals and will begin from 2 weeks post-operatively to 2 months post-operatively ?Identifies appropriate sources of fluids and proteins  ?Identifies appropriate fat sources and healthy verses unhealthy fat types   ?States protein recommendations and appropriate sources post-operatively ?Identifies the need for appropriate texture modifications, mastication, and bite sizes when consuming solids ?Identifies appropriate fat consumption and sources ?Identifies appropriate multivitamin and calcium sources post-operatively ?Describes the need for physical activity post-operatively and will follow MD recommendations ?States when to call healthcare provider regarding medication questions or post-operative complications ?  ?Handouts given during class include: ?Phase 3A: Soft, High Protein Diet Handout ?Phase 3 High Protein Meals ?Healthy Fats ?  ?Follow-Up Plan: ?Patient will follow-up at [facility] in 6 weeks for 2 month post-op nutrition visit for diet advancement per MD.  ?

## 2021-09-22 NOTE — Telephone Encounter (Signed)
Prescription resent

## 2021-09-28 ENCOUNTER — Telehealth: Payer: Self-pay | Admitting: Skilled Nursing Facility1

## 2021-09-28 NOTE — Telephone Encounter (Signed)
RD called pt to verify fluid intake once starting soft, solid proteins 2 week post-bariatric surgery.   Daily Fluid intake:  Daily Protein intake: Bowel Habits:   Concerns/issues:    LVM 

## 2021-09-29 ENCOUNTER — Other Ambulatory Visit: Payer: Self-pay

## 2021-09-29 MED ORDER — ATORVASTATIN CALCIUM 10 MG PO TABS: 10.0000 mg | ORAL_TABLET | Freq: Every day | ORAL | 3 refills | Status: DC

## 2021-10-04 ENCOUNTER — Encounter: Payer: Self-pay | Admitting: Family Medicine

## 2021-10-05 NOTE — Telephone Encounter (Signed)
If generalized itching she can use OTC topical hydrocortisone on affected areas. A daily antihistaminic may also help, Zyrtec 10 mg daily. ?Please advise to arrange appointment if problem is persistent. ?Thanks, ?BJ ?

## 2021-10-22 ENCOUNTER — Encounter: Payer: Self-pay | Admitting: Family Medicine

## 2021-10-25 ENCOUNTER — Other Ambulatory Visit: Payer: Self-pay

## 2021-10-25 ENCOUNTER — Emergency Department (HOSPITAL_COMMUNITY): Payer: Medicare Other

## 2021-10-25 ENCOUNTER — Encounter (HOSPITAL_COMMUNITY): Payer: Self-pay | Admitting: Emergency Medicine

## 2021-10-25 ENCOUNTER — Observation Stay (HOSPITAL_COMMUNITY)
Admission: EM | Admit: 2021-10-25 | Discharge: 2021-10-26 | Disposition: A | Discharge status: Home / Self Care | Payer: Medicare Other | Attending: Family Medicine | Admitting: Family Medicine

## 2021-10-25 DIAGNOSIS — E039 Hypothyroidism, unspecified: Secondary | ICD-10-CM | POA: Diagnosis present

## 2021-10-25 DIAGNOSIS — E876 Hypokalemia: Secondary | ICD-10-CM

## 2021-10-25 DIAGNOSIS — I351 Nonrheumatic aortic (valve) insufficiency: Secondary | ICD-10-CM | POA: Insufficient documentation

## 2021-10-25 DIAGNOSIS — Z794 Long term (current) use of insulin: Secondary | ICD-10-CM | POA: Diagnosis not present

## 2021-10-25 DIAGNOSIS — Z79890 Hormone replacement therapy (postmenopausal): Secondary | ICD-10-CM | POA: Insufficient documentation

## 2021-10-25 DIAGNOSIS — E1165 Type 2 diabetes mellitus with hyperglycemia: Secondary | ICD-10-CM | POA: Insufficient documentation

## 2021-10-25 DIAGNOSIS — F419 Anxiety disorder, unspecified: Secondary | ICD-10-CM | POA: Insufficient documentation

## 2021-10-25 DIAGNOSIS — K219 Gastro-esophageal reflux disease without esophagitis: Secondary | ICD-10-CM | POA: Insufficient documentation

## 2021-10-25 DIAGNOSIS — Z8541 Personal history of malignant neoplasm of cervix uteri: Secondary | ICD-10-CM | POA: Diagnosis not present

## 2021-10-25 DIAGNOSIS — F32A Depression, unspecified: Secondary | ICD-10-CM

## 2021-10-25 DIAGNOSIS — M25512 Pain in left shoulder: Secondary | ICD-10-CM | POA: Insufficient documentation

## 2021-10-25 DIAGNOSIS — M19012 Primary osteoarthritis, left shoulder: Secondary | ICD-10-CM | POA: Insufficient documentation

## 2021-10-25 DIAGNOSIS — E119 Type 2 diabetes mellitus without complications: Secondary | ICD-10-CM

## 2021-10-25 DIAGNOSIS — R0602 Shortness of breath: Secondary | ICD-10-CM | POA: Diagnosis not present

## 2021-10-25 DIAGNOSIS — Z833 Family history of diabetes mellitus: Secondary | ICD-10-CM | POA: Insufficient documentation

## 2021-10-25 DIAGNOSIS — R079 Chest pain, unspecified: Principal | ICD-10-CM | POA: Diagnosis present

## 2021-10-25 DIAGNOSIS — Z6841 Body Mass Index (BMI) 40.0 and over, adult: Secondary | ICD-10-CM | POA: Insufficient documentation

## 2021-10-25 DIAGNOSIS — Z8261 Family history of arthritis: Secondary | ICD-10-CM | POA: Diagnosis not present

## 2021-10-25 DIAGNOSIS — Z90710 Acquired absence of both cervix and uterus: Secondary | ICD-10-CM | POA: Insufficient documentation

## 2021-10-25 DIAGNOSIS — R7401 Elevation of levels of liver transaminase levels: Secondary | ICD-10-CM | POA: Diagnosis not present

## 2021-10-25 DIAGNOSIS — R748 Abnormal levels of other serum enzymes: Secondary | ICD-10-CM | POA: Insufficient documentation

## 2021-10-25 DIAGNOSIS — R61 Generalized hyperhidrosis: Secondary | ICD-10-CM | POA: Insufficient documentation

## 2021-10-25 DIAGNOSIS — R059 Cough, unspecified: Secondary | ICD-10-CM | POA: Insufficient documentation

## 2021-10-25 DIAGNOSIS — R911 Solitary pulmonary nodule: Secondary | ICD-10-CM | POA: Diagnosis not present

## 2021-10-25 DIAGNOSIS — R000 Tachycardia, unspecified: Secondary | ICD-10-CM | POA: Diagnosis not present

## 2021-10-25 DIAGNOSIS — I25119 Atherosclerotic heart disease of native coronary artery with unspecified angina pectoris: Secondary | ICD-10-CM | POA: Insufficient documentation

## 2021-10-25 DIAGNOSIS — R110 Nausea: Secondary | ICD-10-CM | POA: Diagnosis not present

## 2021-10-25 DIAGNOSIS — Z79899 Other long term (current) drug therapy: Secondary | ICD-10-CM | POA: Diagnosis not present

## 2021-10-25 DIAGNOSIS — Z8349 Family history of other endocrine, nutritional and metabolic diseases: Secondary | ICD-10-CM | POA: Diagnosis not present

## 2021-10-25 DIAGNOSIS — R0789 Other chest pain: Secondary | ICD-10-CM | POA: Diagnosis not present

## 2021-10-25 DIAGNOSIS — E785 Hyperlipidemia, unspecified: Secondary | ICD-10-CM | POA: Diagnosis present

## 2021-10-25 DIAGNOSIS — R9431 Abnormal electrocardiogram [ECG] [EKG]: Secondary | ICD-10-CM | POA: Insufficient documentation

## 2021-10-25 DIAGNOSIS — K760 Fatty (change of) liver, not elsewhere classified: Secondary | ICD-10-CM | POA: Insufficient documentation

## 2021-10-25 DIAGNOSIS — Z8616 Personal history of COVID-19: Secondary | ICD-10-CM | POA: Insufficient documentation

## 2021-10-25 DIAGNOSIS — I119 Hypertensive heart disease without heart failure: Secondary | ICD-10-CM | POA: Insufficient documentation

## 2021-10-25 DIAGNOSIS — R002 Palpitations: Secondary | ICD-10-CM | POA: Insufficient documentation

## 2021-10-25 DIAGNOSIS — R7989 Other specified abnormal findings of blood chemistry: Secondary | ICD-10-CM

## 2021-10-25 DIAGNOSIS — I10 Essential (primary) hypertension: Secondary | ICD-10-CM | POA: Diagnosis present

## 2021-10-25 DIAGNOSIS — E7800 Pure hypercholesterolemia, unspecified: Secondary | ICD-10-CM | POA: Insufficient documentation

## 2021-10-25 DIAGNOSIS — Z8249 Family history of ischemic heart disease and other diseases of the circulatory system: Secondary | ICD-10-CM | POA: Insufficient documentation

## 2021-10-25 DIAGNOSIS — G4733 Obstructive sleep apnea (adult) (pediatric): Secondary | ICD-10-CM | POA: Diagnosis present

## 2021-10-25 DIAGNOSIS — Z9884 Bariatric surgery status: Secondary | ICD-10-CM | POA: Insufficient documentation

## 2021-10-25 DIAGNOSIS — E349 Endocrine disorder, unspecified: Secondary | ICD-10-CM

## 2021-10-25 LAB — BASIC METABOLIC PANEL
Anion gap: 8 (ref 5–15)
BUN: 5 mg/dL — ABNORMAL LOW (ref 6–20)
CO2: 27 mmol/L (ref 22–32)
Calcium: 9.3 mg/dL (ref 8.9–10.3)
Chloride: 106 mmol/L (ref 98–111)
Creatinine, Ser: 0.8 mg/dL (ref 0.44–1.00)
GFR, Estimated: >60 mL/min (ref >60)
Glucose, Bld: 119 mg/dL — ABNORMAL HIGH (ref 70–99)
Potassium: 3.4 mmol/L — ABNORMAL LOW (ref 3.5–5.1)
Sodium: 141 mmol/L (ref 135–145)

## 2021-10-25 LAB — CBC
HCT: 42.9 % (ref 36.0–46.0)
Hemoglobin: 13.7 g/dL (ref 12.0–15.0)
MCH: 27.2 pg (ref 26.0–34.0)
MCHC: 31.9 g/dL (ref 30.0–36.0)
MCV: 85.3 fL (ref 80.0–100.0)
Platelets: 348 10*3/uL (ref 150–400)
RBC: 5.03 MIL/uL (ref 3.87–5.11)
RDW: 14.6 % (ref 11.5–15.5)
WBC: 10 10*3/uL (ref 4.0–10.5)
nRBC: 0 % (ref 0.0–0.2)

## 2021-10-25 LAB — HEPATIC FUNCTION PANEL
ALT: 46 U/L — ABNORMAL HIGH (ref 0–44)
AST: 56 U/L — ABNORMAL HIGH (ref 15–41)
Albumin: 3.2 g/dL — ABNORMAL LOW (ref 3.5–5.0)
Alkaline Phosphatase: 126 U/L (ref 38–126)
Bilirubin, Direct: 0.1 mg/dL (ref 0.0–0.2)
Indirect Bilirubin: 0.7 mg/dL (ref 0.3–0.9)
Total Bilirubin: 0.8 mg/dL (ref 0.3–1.2)
Total Protein: 7 g/dL (ref 6.5–8.1)

## 2021-10-25 LAB — I-STAT BETA HCG BLOOD, ED (MC, WL, AP ONLY): I-stat hCG, quantitative: 14.2 m[IU]/mL — ABNORMAL HIGH (ref <5)

## 2021-10-25 LAB — TROPONIN I (HIGH SENSITIVITY)
Troponin I (High Sensitivity): 5 ng/L (ref <18)
Troponin I (High Sensitivity): 5 ng/L (ref <18)

## 2021-10-25 LAB — LIPASE, BLOOD: Lipase: 34 U/L (ref 11–51)

## 2021-10-25 MED ORDER — ASPIRIN 325 MG PO TABS
325.0000 mg | ORAL_TABLET | Freq: Every day | ORAL | Status: DC
  Administered 2021-10-25: 325 mg via ORAL
  Filled 2021-10-25: qty 1

## 2021-10-25 MED ORDER — IBUPROFEN 200 MG PO TABS
400.0000 mg | ORAL_TABLET | Freq: Once | ORAL | Status: AC | PRN
  Administered 2021-10-25: 400 mg via ORAL
  Filled 2021-10-25: qty 2

## 2021-10-25 MED ORDER — IOHEXOL 350 MG/ML SOLN
100.0000 mL | Freq: Once | INTRAVENOUS | Status: AC | PRN
  Administered 2021-10-25: 100 mL via INTRAVENOUS

## 2021-10-25 NOTE — ED Provider Triage Note (Signed)
Emergency Medicine Provider Triage Evaluation Note ? ?[name] Sherman , a 61 y.o. female  was evaluated in triage.  Pt complains of chest pain and left shoulder pain.  Patient reports she has been having issues with her shoulder since March but for the past 3 days the pain has been radiating into her left chest.  She reports last night the pain in her chest with most severe she is experienced it came in if she was getting up to get into bed and she reports she got nauseated and became diaphoretic with the pain.  She reports some associated shortness of breath.  She reports that the chest pain comes in 10-minute episodes but she is continued to have pain that radiates about halfway down her left arm.  She denies prior history of heart issues but has a family history and has an underlying history of hypertension, diabetes and hyperlipidemia.  ? ?Review of Systems  ?Positive: CP, shoulder pain, SOB, nausea, diaphoresis ?Negative: Abdominal pain, vomiting, numbness, weakness ? ?Physical Exam  ?BP (!) 167/100 (BP Location: Right Wrist)   Pulse 92   Temp 98.2 ?F (36.8 ?C) (Oral)   Resp 18   SpO2 100%  ?Gen:   Awake, no distress, does appear uncomfortable ?Resp:  Normal effort, CTA bilat, RRR ?MSK:   Moves extremities without difficulty, patient does have some tenderness over the left shoulder without overlying skin changes, swelling or appreciable deformity ?Other:   ? ?Medical Decision Making  ?Medically screening exam initiated at 2:35 PM.  Appropriate orders placed.  Beatryce Colombo Kitson was informed that the remainder of the evaluation will be completed by another provider, this initial triage assessment does not replace that evaluation, and the importance of remaining in the ED until their evaluation is complete. ? ?Noland Fordyce is very concerning for ACS, EKG without evidence of STEMI, chest pain work-up initiated will need acute bed for further management ?  ?Jacqlyn Larsen, PA-C ?10/25/21 1453 ? ?

## 2021-10-25 NOTE — ED Notes (Signed)
I-STAT BETA HCG 14.2 ?

## 2021-10-25 NOTE — ED Provider Notes (Signed)
?[facility] DEPT ?Provider Note ? ? ?CSN: [id number] ?Arrival date & time: 10/25/21  1417 ? ?  ? ?History ? ?Chief Complaint  ?Patient presents with  ? Chest Pain  ? ? ?[name] Sherman is a 61 y.o. female. ? ?The history is provided by the patient.  ?Chest Pain ?Pain location:  L chest ?Pain quality: aching   ?Pain radiates to:  L arm ?Pain severity:  Mild ?Onset quality:  Gradual ?Duration:  1 week ?Timing:  Intermittent ?Progression:  Waxing and waning ?Chronicity:  New ?Context: at rest   ?Relieved by:  Nothing ?Worsened by:  Nothing ?Associated symptoms: diaphoresis and shortness of breath   ?Associated symptoms: no abdominal pain, no anorexia, no anxiety, no back pain, no claudication, no cough, no dizziness, no dysphagia, no fatigue, no fever, no headache, no heartburn, no lower extremity edema, no nausea, no numbness, no orthopnea, no palpitations, no PND, no syncope, no vomiting and no weakness   ?Risk factors: diabetes mellitus, high cholesterol, hypertension, immobilization (recent surgery 1 month ago or so) and obesity   ?Risk factors: no coronary artery disease and no prior DVT/PE   ? ?  ? ?Home Medications ?Prior to Admission medications   ?Medication Sig Start Date End Date Taking? Authorizing Provider  ?amphetamine-dextroamphetamine (ADDERALL) 30 MG tablet Take one tablet twice daily. ?Patient taking differently: Take 30 mg by mouth 2 (two) times daily. 09/22/21  Yes Burchette, Alinda Sierras, MD  ?atorvastatin (LIPITOR) 10 MG tablet Take 1 tablet (10 mg total) by mouth daily. 09/29/21  Yes Burchette, Alinda Sierras, MD  ?CALCIUM CITRATE PO Take 3 tablets by mouth 2 (two) times daily.   Yes [provider]  ?citalopram (CELEXA) 20 MG tablet Take 1 tablet (20 mg total) by mouth daily. 03/16/21  Yes Burchette, Alinda Sierras, MD  ?furosemide (LASIX) 20 MG tablet TAKE 1 TABLET BY MOUTH ONCE A DAY AS NEEDED FOR SWELLING ?Patient taking differently: Take 20 mg by mouth daily as needed for  fluid. 12/07/20  Yes Burchette, Alinda Sierras, MD  ?levothyroxine (SYNTHROID) 50 MCG tablet Take 1 tablet (50 mcg total) by mouth daily. ?Patient taking differently: Take 50 mcg by mouth daily before breakfast. 09/21/21  Yes Burchette, Alinda Sierras, MD  ?losartan (COZAAR) 100 MG tablet Take 1 tablet (100 mg total) by mouth daily. Keep a log of your blood pressure and heart rate and follow-up with your primary care provider.  Blood pressure medication doses may change rapidly after weight loss surgery. 09/08/21 10/25/21 Yes [name], MD  ?nystatin cream (MYCOSTATIN) Apply 1 application topically 2 (two) times daily. ?Patient taking differently: Apply 1 application. topically 2 (two) times daily as needed (for yeast). 03/16/21  Yes Burchette, Alinda Sierras, MD  ?oxyCODONE-acetaminophen (PERCOCET) 10-325 MG tablet Take 1 tablet by mouth in the morning, at noon, and at bedtime. 07/29/21  Yes [provider]  ?SUMAtriptan (IMITREX) 100 MG tablet May repeat in 2 hours if headache persists or recurs.  Do not take more than 2 in 24 hours ?Patient taking differently: Take 100 mg by mouth every 2 (two) hours as needed for migraine. May repeat in 2 hours if headache persists or recurs.  Do not take more than 2 in 24 hours 12/02/19  Yes Burchette, Alinda Sierras, MD  ?Accu-Chek Softclix Lancets lancets Use as instructed 11/16/20   Burchette, Alinda Sierras, MD  ?blood glucose meter kit and supplies Dispense based on patient and insurance preference. Use up to four times daily as  directed. (FOR ICD-10 E10.9, E11.9). 08/27/21   Burchette, Alinda Sierras, MD  ?enoxaparin (LOVENOX) 40 MG/0.4ML injection Inject 0.4 mLs (40 mg total) into the skin every 12 (twelve) hours. ?Patient not taking: Reported on 10/25/2021 09/08/21 10/25/21  [name], MD  ?enoxaparin (LOVENOX) 40 MG/0.4ML injection Inject 0.4 mLs (40 mg total) under the skin every 12 (twelve) hours for 25 days ?Patient not taking: Reported on 10/25/2021 09/13/21     ?gabapentin (NEURONTIN) 100 MG capsule  Take 2 capsules (200 mg total) by mouth every 12 (twelve) hours. ?Patient not taking: Reported on 10/25/2021 09/08/21   [name], MD  ?glucose blood (ACCU-CHEK GUIDE) test strip Use to test blood sugars 3 times a day. 08/27/21   Burchette, Alinda Sierras, MD  ?Cleda Clarks 100 UNIT/ML KwikPen Inject 10 Units into the skin See admin instructions. Inject 10 units into the skin 3-4 times a day as needed only if BGL is 125 or greater    [provider]  ?insulin aspart (NOVOLOG) 100 UNIT/ML FlexPen Inject 0-20 Units into the skin 3 (three) times daily with meals. Keep a log of your blood sugars and follow-up with your primary care doctor or endocrinologist, once established to further titrate. Monitor blood sugars at least 4x/day. Call PCP if blood sugars consistently > 200 mg/dL ?Patient not taking: Reported on 10/25/2021 09/08/21   [name], MD  ?ondansetron (ZOFRAN-ODT) 4 MG disintegrating tablet Take 1 tablet (4 mg total) by mouth every 6 (six) hours as needed for nausea or vomiting. ?Patient not taking: Reported on 10/25/2021 09/08/21   [name], MD  ?pantoprazole (PROTONIX) 40 MG tablet Take 1 tablet (40 mg total) by mouth daily. ?Patient not taking: Reported on 10/25/2021 09/08/21   [name], MD  ?traMADol (ULTRAM) 50 MG tablet Take 1 tablet (50 mg total) by mouth every 6 (six) hours as needed (pain). ?Patient not taking: Reported on 10/25/2021 09/08/21   [name], MD  ?pantoprazole (PROTONIX) 40 MG tablet TAKE 1 TABLET(40 MG) BY MOUTH TWICE DAILY 02/10/20   Armbruster, Carlota Raspberry, MD  ?   ? ?Allergies    ?Keflex [cephalexin]   ? ?Review of Systems   ?Review of Systems  ?Constitutional:  Positive for diaphoresis. Negative for fatigue and fever.  ?HENT:  Negative for trouble swallowing.   ?Respiratory:  Positive for shortness of breath. Negative for cough.   ?Cardiovascular:  Positive for chest pain. Negative for palpitations, orthopnea, claudication, syncope and PND.   ?Gastrointestinal:  Negative for abdominal pain, anorexia, heartburn, nausea and vomiting.  ?Musculoskeletal:  Negative for back pain.  ?Neurological:  Negative for dizziness, weakness, numbness and headaches.  ? ?Physical Exam ?Updated Vital Signs ?BP (!) 148/88   Pulse 84   Temp 98.2 ?F (36.8 ?C) (Oral)   Resp 17   SpO2 97%  ?Physical Exam ?Vitals and nursing note reviewed.  ?Constitutional:   ?   General: She is not in acute distress. ?   Appearance: She is well-developed.  ?HENT:  ?   Head: Normocephalic and atraumatic.  ?   Mouth/Throat:  ?   Mouth: Mucous membranes are moist.  ?Eyes:  ?   Conjunctiva/sclera: Conjunctivae normal.  ?   Pupils: Pupils are equal, round, and reactive to light.  ?Cardiovascular:  ?   Rate and Rhythm: Normal rate and regular rhythm.  ?   Pulses: Normal pulses.  ?   Heart sounds: Normal heart sounds. No murmur heard. ?Pulmonary:  ?  Effort: Pulmonary effort is normal. No respiratory distress.  ?   Breath sounds: Normal breath sounds. No decreased breath sounds or wheezing.  ?Abdominal:  ?   Palpations: Abdomen is soft.  ?   Tenderness: There is no abdominal tenderness.  ?Musculoskeletal:     ?   General: No swelling.  ?   Cervical back: Normal range of motion and neck supple.  ?   Right lower leg: No edema.  ?   Left lower leg: No edema.  ?Skin: ?   General: Skin is warm and dry.  ?   Capillary Refill: Capillary refill takes less than 2 seconds.  ?Neurological:  ?   Mental Status: She is alert.  ?Psychiatric:     ?   Mood and Affect: Mood normal.  ? ? ?ED Results / Procedures / Treatments   ?Labs ?(all labs ordered are listed, but only abnormal results are displayed) ?Labs Reviewed  ?BASIC METABOLIC PANEL - Abnormal; Notable for the following components:  ?    Result Value  ? Potassium 3.4 (*)   ? Glucose, Bld 119 (*)   ? BUN 5 (*)   ? All other components within normal limits  ?HEPATIC FUNCTION PANEL - Abnormal; Notable for the following components:  ? Albumin 3.2 (*)   ? AST  56 (*)   ? ALT 46 (*)   ? All other components within normal limits  ?I-STAT BETA HCG BLOOD, ED ([facility], [facility], [facility] ONLY) - Abnormal; Notable for the following components:  ? I-stat hCG, quantitative 14.2 (*)   ? All other components w

## 2021-10-25 NOTE — ED Triage Notes (Signed)
Patient reports L shoulder pain with SOB and chest pain last night. States palpitations, continued shoulder pain and SOB today. ?

## 2021-10-25 NOTE — ED Notes (Signed)
Pt c/o chest pain coming back radiating into left arm.  EKG obtained and given to Dr. Ronnald Nian. ?

## 2021-10-26 ENCOUNTER — Encounter (HOSPITAL_COMMUNITY): Payer: Self-pay | Admitting: Internal Medicine

## 2021-10-26 ENCOUNTER — Other Ambulatory Visit: Payer: Self-pay | Admitting: Cardiology

## 2021-10-26 ENCOUNTER — Observation Stay (HOSPITAL_BASED_OUTPATIENT_CLINIC_OR_DEPARTMENT_OTHER): Payer: Medicare Other

## 2021-10-26 ENCOUNTER — Observation Stay (HOSPITAL_COMMUNITY): Payer: Medicare Other

## 2021-10-26 DIAGNOSIS — R7401 Elevation of levels of liver transaminase levels: Secondary | ICD-10-CM

## 2021-10-26 DIAGNOSIS — E119 Type 2 diabetes mellitus without complications: Secondary | ICD-10-CM

## 2021-10-26 DIAGNOSIS — I479 Paroxysmal tachycardia, unspecified: Secondary | ICD-10-CM

## 2021-10-26 DIAGNOSIS — R079 Chest pain, unspecified: Secondary | ICD-10-CM

## 2021-10-26 DIAGNOSIS — F419 Anxiety disorder, unspecified: Secondary | ICD-10-CM

## 2021-10-26 DIAGNOSIS — E349 Endocrine disorder, unspecified: Secondary | ICD-10-CM

## 2021-10-26 DIAGNOSIS — E876 Hypokalemia: Secondary | ICD-10-CM

## 2021-10-26 DIAGNOSIS — R911 Solitary pulmonary nodule: Secondary | ICD-10-CM

## 2021-10-26 DIAGNOSIS — R7989 Other specified abnormal findings of blood chemistry: Secondary | ICD-10-CM

## 2021-10-26 DIAGNOSIS — R000 Tachycardia, unspecified: Secondary | ICD-10-CM

## 2021-10-26 DIAGNOSIS — E1165 Type 2 diabetes mellitus with hyperglycemia: Secondary | ICD-10-CM

## 2021-10-26 LAB — ECHOCARDIOGRAM COMPLETE
Area-P 1/2: 4.17 cm2
Height: 64 in
S' Lateral: 2.6 cm
Weight: 3961.23 oz

## 2021-10-26 LAB — HIV ANTIBODY (ROUTINE TESTING W REFLEX): HIV Screen 4th Generation wRfx: NONREACTIVE

## 2021-10-26 LAB — GLUCOSE, CAPILLARY
Glucose-Capillary: 108 mg/dL — ABNORMAL HIGH (ref 70–99)
Glucose-Capillary: 105 mg/dL — ABNORMAL HIGH (ref 70–99)
Glucose-Capillary: 113 mg/dL — ABNORMAL HIGH (ref 70–99)
Glucose-Capillary: 120 mg/dL — ABNORMAL HIGH (ref 70–99)

## 2021-10-26 MED ORDER — LOSARTAN POTASSIUM 50 MG PO TABS
100.0000 mg | ORAL_TABLET | Freq: Every day | ORAL | Status: DC
  Administered 2021-10-26: 100 mg via ORAL
  Filled 2021-10-26: qty 2

## 2021-10-26 MED ORDER — LEVOTHYROXINE SODIUM 50 MCG PO TABS
50.0000 ug | ORAL_TABLET | Freq: Every day | ORAL | Status: DC
  Administered 2021-10-26: 50 ug via ORAL
  Filled 2021-10-26: qty 1

## 2021-10-26 MED ORDER — ENOXAPARIN SODIUM 40 MG/0.4ML IJ SOSY
40.0000 mg | PREFILLED_SYRINGE | INTRAMUSCULAR | Status: DC
  Administered 2021-10-26: 40 mg via SUBCUTANEOUS
  Filled 2021-10-26: qty 0.4

## 2021-10-26 MED ORDER — INSULIN ASPART 100 UNIT/ML IJ SOLN: 0.0000 [IU] | Freq: Three times a day (TID) | INTRAMUSCULAR | Status: DC

## 2021-10-26 MED ORDER — IOHEXOL 350 MG/ML SOLN
100.0000 mL | Freq: Once | INTRAVENOUS | Status: AC | PRN
  Administered 2021-10-26: 100 mL via INTRAVENOUS

## 2021-10-26 MED ORDER — INSULIN GLARGINE-YFGN 100 UNIT/ML ~~LOC~~ SOLN
5.0000 [IU] | Freq: Every day | SUBCUTANEOUS | Status: DC
  Administered 2021-10-26: 5 [IU] via SUBCUTANEOUS
  Filled 2021-10-26: qty 0.05

## 2021-10-26 MED ORDER — ATORVASTATIN CALCIUM 10 MG PO TABS
10.0000 mg | ORAL_TABLET | Freq: Every day | ORAL | Status: DC
  Administered 2021-10-26: 10 mg via ORAL
  Filled 2021-10-26: qty 1

## 2021-10-26 MED ORDER — INSULIN ASPART 100 UNIT/ML IJ SOLN: 0.0000 [IU] | Freq: Every day | INTRAMUSCULAR | Status: DC

## 2021-10-26 MED ORDER — METOPROLOL TARTRATE 50 MG PO TABS
100.0000 mg | ORAL_TABLET | Freq: Once | ORAL | Status: AC
  Administered 2021-10-26: 100 mg via ORAL
  Filled 2021-10-26: qty 2

## 2021-10-26 MED ORDER — ACETAMINOPHEN 325 MG PO TABS: 650.0000 mg | ORAL_TABLET | Freq: Four times a day (QID) | ORAL | Status: DC | PRN

## 2021-10-26 MED ORDER — POTASSIUM CHLORIDE CRYS ER 20 MEQ PO TBCR
40.0000 meq | EXTENDED_RELEASE_TABLET | Freq: Once | ORAL | Status: AC
  Administered 2021-10-26: 40 meq via ORAL
  Filled 2021-10-26: qty 2

## 2021-10-26 MED ORDER — ACETAMINOPHEN 650 MG RE SUPP: 650.0000 mg | Freq: Four times a day (QID) | RECTAL | Status: DC | PRN

## 2021-10-26 MED ORDER — CITALOPRAM HYDROBROMIDE 20 MG PO TABS
20.0000 mg | ORAL_TABLET | Freq: Every day | ORAL | Status: DC
  Administered 2021-10-26: 20 mg via ORAL
  Filled 2021-10-26: qty 1

## 2021-10-26 NOTE — Consult Note (Addendum)
?Cardiology Consultation:  ? ?Patient ID: Chasty Randal Weinmann ?MRN: [id number]; DOB: 04-16-1961 ? ?Admit date: 10/25/2021 ?Date of Consult: 10/26/2021 ? ?PCP:  [name], MD ?  ?[facility] Providers ?Cardiologist:  None      ? ? ?Patient Profile:  ? ?Donnette Macmullen Kaigler is a 61 y.o. female with a hx of HTN, HLD, hypothyroidism, DM-2, depression, GERD, recent gastric sleeve 09/07/21 OSA on CPAP admitted 10/26/21 with chest pain who is being seen 10/26/2021 for the evaluation of chest pain at the request of Dr. Sarajane Jews. ? ?History of Present Illness:  ? ?Ms. Filsaime with no prior cardiac hx but does have DM-2, HTN, HLD, obesity and some FH of CAD with father with MI in his mid 60s and grandparents.  Pt was getting in the bed  2 nights ago and felt SOB, then with lt sided chest pain with radiation to her left shoulder and arm. She was SOB and diaphoretic at the time.  She noted her heart racing as well-different than her elevated HR post COVID.  She did not place her CPAP due to her SOB.  She sat up and symptoms resolved after fan was blowing on her.  No exertional chest pain or SOB.  No fevers, no nausea, vomiting. She has noticed cough in last 2 weeks mostly after deep breath.   ? ?She does tell me after COVID in 2021 her HR was in the 120s most of time.  Her PCP was going to add meds but they waited until after her gastric sleeve to see if it would improve. ? ?HS troponin 5, 5  ?Na 141, k+ 3.4 glucose 119, Cr 0.80 Hgb 13.7 WBC 10 plts 348  ? ? ?CXR 2 V NAD  ?Lt shoulder xray: IMPRESSION: ?1. Mild degenerative changes of the left shoulder. No acute bony ?Abnormality ?CTA of chest neg for PE, no mention of CAD.  +9 mm pleural based noncalcified right lower lobe lung nodule- pt notified by IM.  ?EKG:  The EKG was personally reviewed and demonstrates:  SR without acute ST changes, small R wave in ant leads is old.  ?Telemetry:  Telemetry was personally reviewed and demonstrates:  SR ? ?BP 137/73 to 151/80 P 78-88 afebrile   ?Currently she feels tired.  ? ?Past Medical History:  ?Diagnosis Date  ? ADD (attention deficit disorder)   ? Anemia   ? Anxiety   ? Arthritis   ? shoulder, right (arthritis, tendonitis, bursitis)  ? Chronic low back pain   ? Complication of anesthesia   ? Dyspnea   ? Fatty liver   ? GERD (gastroesophageal reflux disease)   ? History of cervical cancer   ? s/p  laser ablation of cervix 1980's  ? History of COVID-19 07/2019  ? History of kidney stones   ? Hyperlipidemia   ? Hypertension   ? Hypothyroidism   ? IBS (irritable bowel syndrome)   ? MDD (major depressive disorder)   ? Migraine   ? OSA (obstructive sleep apnea)   ? pt used cpap up until 2013 states lost wt and did not need anymore  ? PONV (postoperative nausea and vomiting)   ? Restless legs   ? Sleep apnea   ? wears CPAP  ? Type 2 diabetes mellitus ([place])   ? Urgency of urination   ? Vertigo   ? Vitamin D deficiency   ? Vulvar cyst   ? ? ?Past Surgical History:  ?Procedure Laterality Date  ? BUNIONECTOMY Right 2004  ?  COLONOSCOPY    ? CYSTOSCOPY W/ RETROGRADES Bilateral 05/26/2015  ? Procedure: CYSTOSCOPY WITH RETROGRADE PYELOGRAM;  Surgeon: Festus Aloe, MD;  Location: [facility];  Service: Urology;  Laterality: Bilateral;  ? CYSTOSCOPY W/ URETERAL STENT REMOVAL Left 05/26/2015  ? Procedure: CYSTOSCOPY WITH STENT REMOVAL;  Surgeon: Festus Aloe, MD;  Location: [facility];  Service: Urology;  Laterality: Left;  ? CYSTOSCOPY WITH RETROGRADE PYELOGRAM, URETEROSCOPY AND STENT PLACEMENT Left 05/01/2015  ? Procedure: CYSTOSCOPY WITH RETROGRADE PYELOGRAM AND STENT PLACEMENT;  Surgeon: Festus Aloe, MD;  Location: [facility];  Service: Urology;  Laterality: Left;  ? CYSTOSCOPY WITH STENT PLACEMENT Bilateral 05/26/2015  ? Procedure: CYSTOSCOPY WITH STENT PLACEMENT;  Surgeon: Festus Aloe, MD;  Location: [facility];  Service: Urology;  Laterality: Bilateral;  ? CYSTOSCOPY WITH URETEROSCOPY Bilateral  05/26/2015  ? Procedure: CYSTOSCOPY WITH URETEROSCOPY;  Surgeon: Festus Aloe, MD;  Location: [facility];  Service: Urology;  Laterality: Bilateral;  ? CYSTOSCOPY/URETEROSCOPY/HOLMIUM LASER/STENT PLACEMENT Right 06/09/2015  ? Procedure: CYSTOSCOPY/URETEROSCOPY/STENT PLACEMENT REMOVAL LEFT URETERAL STENT;  Surgeon: Festus Aloe, MD;  Location: [facility];  Service: Urology;  Laterality: Right;  ? DIAGNOSTIC LAPAROSCOPY    ? HOLMIUM LASER APPLICATION Left 40/01/6760  ? Procedure: HOLMIUM LASER APPLICATION;  Surgeon: Festus Aloe, MD;  Location[facility];  Service: Urology;  Laterality: Left;  ? HOLMIUM LASER APPLICATION Right 95/02/3266  ? Procedure: HOLMIUM LASER APPLICATION;  Surgeon: Festus Aloe, MD;  Location: [facility];  Service: Urology;  Laterality: Right;  ? INSERTION OF MESH N/A 09/05/2016  ? Procedure: INSERTION OF MESH;  Surgeon: [name], MD;  Location: [place];  Service: General;  Laterality: N/A;  ? LAPAROSCOPIC ASSISTED VAGINAL HYSTERECTOMY  04/23/2002  ? [facility]   by Dr Willis Modena;  With Left Salpingoophorectomy/  Anterior Repair/  Tension Free Tape Sling placement  ? LAPAROSCOPIC GASTRIC SLEEVE RESECTION N/A 09/07/2021  ? Procedure: LAPAROSCOPIC GASTRIC SLEEVE RESECTION;  Surgeon: [name], MD;  Location: [facility];  Service: General;  Laterality: N/A;  ? LASER ABLATION OF THE CERVIX  x2  1980's  ? POSTERIOR LAMINECTOMY / DECOMPRESSION LUMBAR SPINE  02/03/2020  ? [facility]   Dr Ellene Route;   L4--5 and fusion  ? POSTERIOR LUMBAR FUSION  01/21/2019  ? [facility]  by Dr Ellene Route;   L5--S1  ? TUBAL LIGATION  1980's  ? UPPER GI ENDOSCOPY N/A 09/07/2021  ? Procedure: UPPER GI ENDOSCOPY;  Surgeon: [name], MD;  Location: [facility];  Service: General;  Laterality: N/A;  ? VENTRAL HERNIA REPAIR N/A 09/05/2016  ? Procedure: LAPAROSCOPIC VENTRAL HERNIA;  Surgeon: [name], MD;  Location: [place];  Service: General;  Laterality: N/A;  ?  ? ?Home Medications:  ?Prior to  Admission medications   ?Medication Sig Start Date End Date Taking? Authorizing Provider  ?amphetamine-dextroamphetamine (ADDERALL) 30 MG tablet Take one tablet twice daily. ?Patient taking differently: Take 30 mg by mouth 2 (two) times daily. 09/22/21  Yes Burchette, Alinda Sierras, MD  ?atorvastatin (LIPITOR) 10 MG tablet Take 1 tablet (10 mg total) by mouth daily. 09/29/21  Yes Burchette, Alinda Sierras, MD  ?CALCIUM CITRATE PO Take 3 tablets by mouth 2 (two) times daily.   Yes [provider]  ?cetirizine (ZYRTEC) 10 MG tablet Take 10 mg by mouth in the morning.   Yes [provider]  ?citalopram (CELEXA) 20 MG tablet Take 1 tablet (20 mg total) by mouth daily. 03/16/21  Yes Burchette, Alinda Sierras, MD  ?furosemide (  LASIX) 20 MG tablet TAKE 1 TABLET BY MOUTH ONCE A DAY AS NEEDED FOR SWELLING ?Patient taking differently: Take 20 mg by mouth daily as needed for fluid. 12/07/20  Yes Burchette, Alinda Sierras, MD  ?Cleda Clarks 100 UNIT/ML KwikPen Inject 10 Units into the skin See admin instructions. Inject 10 units into the skin 3-4 times a day as needed only if BGL is 125 or greater   Yes [provider]  ?levothyroxine (SYNTHROID) 50 MCG tablet Take 1 tablet (50 mcg total) by mouth daily. ?Patient taking differently: Take 50 mcg by mouth daily before breakfast. 09/21/21  Yes Burchette, Alinda Sierras, MD  ?losartan (COZAAR) 100 MG tablet Take 1 tablet (100 mg total) by mouth daily. Keep a log of your blood pressure and heart rate and follow-up with your primary care provider.  Blood pressure medication doses may change rapidly after weight loss surgery. 09/08/21 10/25/21 Yes [name], MD  ?NON FORMULARY Take 1 tablet by mouth See admin instructions. unnamed Bariatric vitamin- Take 1 tablet by mouth once a day   Yes [provider]  ?nystatin cream (MYCOSTATIN) Apply 1 application topically 2 (two) times daily. ?Patient taking differently: Apply 1 application. topically 2 (two) times daily as needed (for  yeast). 03/16/21  Yes Burchette, Alinda Sierras, MD  ?oxyCODONE-acetaminophen (PERCOCET) 10-325 MG tablet Take 1 tablet by mouth in the morning, at noon, and at bedtime. 07/29/21  Yes [provider]  ?SUMAtriptan (

## 2021-10-26 NOTE — Assessment & Plan Note (Signed)
Continue Synthroid °

## 2021-10-26 NOTE — H&P (Signed)
?History and Physical  ? ? ?[name] Sherman XHB:[id number] DOB: [birth year] DOA: 10/25/2021 ? ?PCP: [name], MD ? ?Patient coming from: Home ? ?Chief Complaint: Chest pain ? ?HPI: [name] Sherman is a 61 y.o. female with medical history significant of hypertension, hyperlipidemia, hypothyroidism, type II diabetes, depression, anxiety, GERD, class III obesity (BMI 42.50), recent gastric sleeve surgery on 09/07/2021, OSA on CPAP presented to the ED with complaints of chest pain and shortness of breath.  Not hypoxic.  Labs showing no leukocytosis or anemia.  Potassium 3.4.  AST 56, ALT 46.  Alk phos and T. bili normal.  Lipase normal.  High-sensitivity troponin negative x2.  EKG without acute ischemic changes.  CTA negative for PE or other acute intrathoracic process.  X-ray of left shoulder showing mild degenerative changes and no acute finding. Patient was given aspirin. ? ?Patient states 2 nights ago she woke up all of a sudden from her sleep with left-sided chest pain which radiated to her left shoulder and arm.  She is not able to describe how the pain felt but recalls feeling short of breath and sweating profusely at that time.  Also her heart was racing.  She then sat up at the side of the bed and switched on a bedside fan.  Symptoms resolved after several minutes.  She denies history of exertional chest pain or shortness of breath.  Denies personal history of CAD and has never had a stress test or cardiac catheterization done.  Does report family history of CAD (father and paternal grandparents).  Denies history of blood clots.  Denies fevers, cough, nausea, vomiting, or abdominal pain. ? ?Review of Systems:  ?Review of Systems  ?All other systems reviewed and are negative. ? ?Past Medical History:  ?Diagnosis Date  ? ADD (attention deficit disorder)   ? Anemia   ? Anxiety   ? Arthritis   ? shoulder, right (arthritis, tendonitis, bursitis)  ? Chronic low back pain   ? Complication of anesthesia   ?  Dyspnea   ? Fatty liver   ? GERD (gastroesophageal reflux disease)   ? History of cervical cancer   ? s/p  laser ablation of cervix 1980's  ? History of COVID-19 07/2019  ? History of kidney stones   ? Hyperlipidemia   ? Hypertension   ? Hypothyroidism   ? IBS (irritable bowel syndrome)   ? MDD (major depressive disorder)   ? Migraine   ? OSA (obstructive sleep apnea)   ? pt used cpap up until 2013 states lost wt and did not need anymore  ? PONV (postoperative nausea and vomiting)   ? Restless legs   ? Sleep apnea   ? wears CPAP  ? Type 2 diabetes mellitus ([place])   ? Urgency of urination   ? Vertigo   ? Vitamin D deficiency   ? Vulvar cyst   ? ? ?Past Surgical History:  ?Procedure Laterality Date  ? BUNIONECTOMY Right 2004  ? COLONOSCOPY    ? CYSTOSCOPY W/ RETROGRADES Bilateral 05/26/2015  ? Procedure: CYSTOSCOPY WITH RETROGRADE PYELOGRAM;  Surgeon: Festus Aloe, MD;  Location: [facility];  Service: Urology;  Laterality: Bilateral;  ? CYSTOSCOPY W/ URETERAL STENT REMOVAL Left 05/26/2015  ? Procedure: CYSTOSCOPY WITH STENT REMOVAL;  Surgeon: Festus Aloe, MD;  Location: [facility];  Service: Urology;  Laterality: Left;  ? CYSTOSCOPY WITH RETROGRADE PYELOGRAM, URETEROSCOPY AND STENT PLACEMENT Left 05/01/2015  ? Procedure: CYSTOSCOPY WITH RETROGRADE PYELOGRAM AND STENT PLACEMENT;  Surgeon: Festus Aloe, MD;  [facility];  Service: Urology;  Laterality: Left;  ? CYSTOSCOPY WITH STENT PLACEMENT Bilateral 05/26/2015  ? Procedure: CYSTOSCOPY WITH STENT PLACEMENT;  Surgeon: Festus Aloe, MD;  Location: [facility];  Service: Urology;  Laterality: Bilateral;  ? CYSTOSCOPY WITH URETEROSCOPY Bilateral 05/26/2015  ? Procedure: CYSTOSCOPY WITH URETEROSCOPY;  Surgeon: Festus Aloe, MD;  Location: [facility];  Service: Urology;  Laterality: Bilateral;  ? CYSTOSCOPY/URETEROSCOPY/HOLMIUM LASER/STENT PLACEMENT Right 06/09/2015  ? Procedure:  CYSTOSCOPY/URETEROSCOPY/STENT PLACEMENT REMOVAL LEFT URETERAL STENT;  Surgeon: Festus Aloe, MD;  Location[facility];  Service: Urology;  Laterality: Right;  ? DIAGNOSTIC LAPAROSCOPY    ? HOLMIUM LASER APPLICATION Left 63/14/9702  ? Procedure: HOLMIUM LASER APPLICATION;  Surgeon: Festus Aloe, MD;  Location: [facility];  Service: Urology;  Laterality: Left;  ? HOLMIUM LASER APPLICATION Right 63/78/5885  ? Procedure: HOLMIUM LASER APPLICATION;  Surgeon: Festus Aloe, MD;  Location: [facility];  Service: Urology;  Laterality: Right;  ? INSERTION OF MESH N/A 09/05/2016  ? Procedure: INSERTION OF MESH;  Surgeon: [name], MD;  Location: [place];  Service: General;  Laterality: N/A;  ? LAPAROSCOPIC ASSISTED VAGINAL HYSTERECTOMY  04/23/2002  ? _0   by Dr Willis Modena;  With Left Salpingoophorectomy/  Anterior Repair/  Tension Free Tape Sling placement  ? LAPAROSCOPIC GASTRIC SLEEVE RESECTION N/A 09/07/2021  ? Procedure: LAPAROSCOPIC GASTRIC SLEEVE RESECTION;  Surgeon: [name], MD;  Location[facility];  Service: General;  Laterality: N/A;  ? LASER ABLATION OF THE CERVIX  x2  1980's  ? POSTERIOR LAMINECTOMY / DECOMPRESSION LUMBAR SPINE  02/03/2020  ? _1   Dr Ellene Route;   L4--5 and fusion  ? POSTERIOR LUMBAR FUSION  01/21/2019  ? _2  by Dr Ellene Route;   L5--S1  ? TUBAL LIGATION  1980's  ? UPPER GI ENDOSCOPY N/A 09/07/2021  ? Procedure: UPPER GI ENDOSCOPY;  Surgeon: [name], MD;  Location[facility];  Service: General;  Laterality: N/A;  ? VENTRAL HERNIA REPAIR N/A 09/05/2016  ? Procedure: LAPAROSCOPIC VENTRAL HERNIA;  Surgeon: [name], MD;  Location: [place];  Service: General;  Laterality: N/A;  ? ? ? reports that she has never smoked. She has never used smokeless tobacco. She reports current alcohol use. She reports that she does not use drugs. ? ?Allergies  ?Allergen Reactions  ? Keflex [Cephalexin] Swelling and Other (See Comments)  ?  Tongue swelling  ? ? ?Family History  ?Problem  Relation Age of Onset  ? Alcohol abuse Mother   ? Lung cancer Mother   ? Colon cancer Mother   ? Arthritis Father   ? Hyperlipidemia Father   ? Heart disease Father   ? Hypertension Father   ? COPD Father   ? Kidney disease Paternal Uncle   ? Cancer Maternal Grandmother   ?     colon  ? Colon cancer Maternal Grandmother   ? Rectal cancer Maternal Grandmother   ? Cancer Paternal Grandmother   ?     breast  ? Kidney cancer Sister   ?     lung cancer  ? Cirrhosis Sister   ? Esophageal cancer Neg Hx   ? Stomach cancer Neg Hx   ? ? ?Prior to Admission medications   ?Medication Sig Start Date End Date Taking? Authorizing Provider  ?amphetamine-dextroamphetamine (ADDERALL) 30 MG tablet Take one tablet twice daily. ?Patient taking differently: Take 30 mg by mouth 2 (two) times daily. 09/22/21  Yes  Burchette, Alinda Sierras, MD  ?atorvastatin (LIPITOR) 10 MG tablet Take 1 tablet (10 mg total) by mouth daily. 09/29/21  Yes Burchette, Alinda Sierras, MD  ?CALCIUM CITRATE PO Take 3 tablets by mouth 2 (two) times daily.   Yes [provider]  ?cetirizine (ZYRTEC) 10 MG tablet Take 10 mg by mouth in the morning.   Yes [provider]  ?citalopram (CELEXA) 20 MG tablet Take 1 tablet (20 mg total) by mouth daily. 03/16/21  Yes Burchette, Alinda Sierras, MD  ?furosemide (LASIX) 20 MG tablet TAKE 1 TABLET BY MOUTH ONCE A DAY AS NEEDED FOR SWELLING ?Patient taking differently: Take 20 mg by mouth daily as needed for fluid. 12/07/20  Yes Burchette, Alinda Sierras, MD  ?Cleda Clarks 100 UNIT/ML KwikPen Inject 10 Units into the skin See admin instructions. Inject 10 units into the skin 3-4 times a day as needed only if BGL is 125 or greater   Yes [provider]  ?levothyroxine (SYNTHROID) 50 MCG tablet Take 1 tablet (50 mcg total) by mouth daily. ?Patient taking differently: Take 50 mcg by mouth daily before breakfast. 09/21/21  Yes Burchette, Alinda Sierras, MD  ?losartan (COZAAR) 100 MG tablet Take 1 tablet (100 mg total) by mouth daily. Keep a  log of your blood pressure and heart rate and follow-up with your primary care provider.  Blood pressure medication doses may change rapidly after weight loss surgery. 09/08/21 10/25/21 Yes [name], MD  ?Salley Scarlet

## 2021-10-26 NOTE — Assessment & Plan Note (Addendum)
-  She reports some point tenderness on examination and palpation of the left chest. ?-Troponins were negative x2.  EKG is nonischemic. ?-Echo shows normal LVEF with no wall motion abnormalities. ?-CV risk factors include diabetes, hypertension and obesity. ?-CT PE study negative.  No coronary calcium noted. ?-Overall suspect this is musculoskeletal versus acid reflux.  However given risk factors we will proceed with coronary CTA today.  She will take 100 mg metoprolol to tartrate.  18-gauge IV has been placed by nursing.  We will proceed with coronary CTA today.  This is normal suspect her symptoms are acid reflux or inflammatory related.  Symptoms do not seem to represent pericarditis.  We will add ESR and CRP just in case.  Suspect she can just be treated with a short course for possible costochondritis if CT is negative. ? ?CTA negative for PE.  ACS less likely as high-sensitivity troponin negative x2.  Chest pain nonexertional but does have multiple risk factors for CAD.  Heart score 5.  Never had stress test or cardiac catheterization done in the past.  Currently chest pain-free. ?-Cardiac monitoring ?-Aspirin given ?-Echocardiogram ?-I have sent a message to cardiology requesting consultation in the morning. ?

## 2021-10-26 NOTE — Assessment & Plan Note (Signed)
Poorly controlled -A1c 12.6 on 08/17/2021.  She is not on long-acting insulin. ?-Start Semglee 5 units daily ?-Sensitive sliding scale insulin ACHS ?

## 2021-10-26 NOTE — Discharge Summary (Signed)
?Physician Discharge Summary ?  ?Patient: [name] Sherman MRN: [id number] DOB: 01/29/1961  ?Admit date:     10/25/2021  ?Discharge date: 10/26/21  ?Discharge Physician: Daniel Goodrich  ? ?PCP: Burchette, Bruce W, MD  ? ?Recommendations at discharge:  ? ?* Chest pain ?-- 7-day event monitor planned, cardiology will interpret and then contact patient to see whether she needs to follow-up or not. ? ?Elevated serum hCG ?--14.2, elevated, significance unclear, likely no clinical significance, follow-up as an outpatient, consider repeat testing.  Patient is status post hysterectomy. ? ?Incidental pulmonary nodule ?--CT showing a 9 mm pleural-based noncalcified right lower lobe lung nodule.   ?--Consider one of the following in 3 months for both low-risk and ?high-risk individuals: (a) repeat chest CT, (b) follow-up PET-CT, or ?(c) tissue sampling. ? ?Transaminitis likely secondary to hepatic steatosis ?--Seen on previous labs as well and improved.  No history of heavy alcohol use.  Ultrasound done in the past showing hepatic steatosis. ?--follow-up as an outpatient ? ?Discharge Diagnoses: ?Principal Problem: ?  Chest pain ?Active Problems: ?  Elevated serum hCG ?  Hyperlipidemia ?  OSA (obstructive sleep apnea) ?  Essential hypertension ?  Hypothyroid ?  Morbid obesity (HCC) ?  Hypokalemia ?  Transaminitis ?  Incidental pulmonary nodule ?  Type 2 diabetes mellitus (HCC) ?  Anxiety and depression ?  Tachycardia ? ?Resolved Problems: ?  * No resolved hospital problems. * ? ?Hospital Course: ?61-year-old woman PMH hypertension, hyperlipidemia, diabetes mellitus type 2, morbid obesity status post gastric sleeve 3/14, presented with chest pain and shortness of breath.  Admitted for further evaluation and cardiology consultation.  Seen by cardiology, underwent coronary CTA which showed minimal nonobstructive CAD.  Remainder of work-up was unrevealing.  Further history revealed traumatic event earlier this week when the patient's  dog was viciously killed by another dog in front of her eyes resulting in much distress. ? ?* Chest pain ?--Resolved.  1 event only.  No previous cardiac disease.  Troponins negative, ACS ruled out, EKG nonacute, echocardiogram reassuring, CT PE study negative.  Coronary CTA nonobstructive disease. ?--No particular recurring reflux symptoms.  Did have some palpitations, outpatient monitor planned. ?--In further discussion with patient, this may have represented a panic attack given the traumatic killing of her dog earlier this week by a pit bull. ?--Home today ? ?Elevated serum hCG ?--14.2, elevated, significance unclear, likely no clinical significance, follow-up as an outpatient ? ?Type 2 diabetes mellitus (HCC) ?-- Stable ? ?Incidental pulmonary nodule ?--CT showing a 9 mm pleural-based noncalcified right lower lobe lung nodule.   ?--Consider one of the following in 3 months for both low-risk and ?high-risk individuals: (a) repeat chest CT, (b) follow-up PET-CT, or ?(c) tissue sampling. ? ?Transaminitis likely secondary to hepatic steatosis ?--Seen on previous labs as well and improved.  No history of heavy alcohol use.  Ultrasound done in the past showing hepatic steatosis. ?--follow-up as an outpatient ? ?Morbid obesity (HCC) ?--Body mass index is 42.5 kg/m?. ?Underwent bariatric surgery 6 weeks ago. ?-Encourage lifestyle modifications/weight loss. ? ?Hypothyroid ?-Continue Synthroid ?  ?OSA (obstructive sleep apnea) ?--CPAP QHS ? ?  ? ?Consultants:  ?Cardiology  ? ?Procedures performed:  ?None ?  ?Disposition: Home ?Diet recommendation:  ?Cardiac and Carb modified diet ?DISCHARGE MEDICATION: ?Allergies as of 10/26/2021   ? ?   Reactions  ? Keflex [cephalexin] Swelling, Other (See Comments)  ? Tongue swelling  ? ?  ? ?  ?Medication List  ?  ? ?STOP taking   these medications   ? ?enoxaparin 40 MG/0.4ML injection ?Commonly known as: LOVENOX ?  ?gabapentin 100 MG capsule ?Commonly known as: NEURONTIN ?  ?HumaLOG  KwikPen 100 UNIT/ML KwikPen ?Generic drug: insulin lispro ?  ?pantoprazole 40 MG tablet ?Commonly known as: PROTONIX ?  ?traMADol 50 MG tablet ?Commonly known as: ULTRAM ?  ? ?  ? ?TAKE these medications   ? ?Accu-Chek Guide test strip ?Generic drug: glucose blood ?Use to test blood sugars 3 times a day. ?  ?Accu-Chek Softclix Lancets lancets ?Use as instructed ?  ?amphetamine-dextroamphetamine 30 MG tablet ?Commonly known as: Adderall ?Take one tablet twice daily. ?What changed:  ?how much to take ?how to take this ?when to take this ?additional instructions ?  ?atorvastatin 10 MG tablet ?Commonly known as: LIPITOR ?Take 1 tablet (10 mg total) by mouth daily. ?  ?blood glucose meter kit and supplies ?Dispense based on patient and insurance preference. Use up to four times daily as directed. (FOR ICD-10 E10.9, E11.9). ?  ?CALCIUM CITRATE PO ?Take 3 tablets by mouth 2 (two) times daily. ?  ?cetirizine 10 MG tablet ?Commonly known as: ZYRTEC ?Take 10 mg by mouth in the morning. ?  ?citalopram 20 MG tablet ?Commonly known as: CELEXA ?Take 1 tablet (20 mg total) by mouth daily. ?  ?furosemide 20 MG tablet ?Commonly known as: LASIX ?TAKE 1 TABLET BY MOUTH ONCE A DAY AS NEEDED FOR SWELLING ?What changed: See the new instructions. ?  ?insulin aspart 100 UNIT/ML FlexPen ?Commonly known as: NOVOLOG ?Inject 0-20 Units into the skin 3 (three) times daily with meals. Keep a log of your blood sugars and follow-up with your primary care doctor or endocrinologist, once established to further titrate. Monitor blood sugars at least 4x/day. Call PCP if blood sugars consistently > 200 mg/dL ?  ?levothyroxine 50 MCG tablet ?Commonly known as: Synthroid ?Take 1 tablet (50 mcg total) by mouth daily. ?What changed: when to take this ?  ?losartan 100 MG tablet ?Commonly known as: COZAAR ?Take 1 tablet (100 mg total) by mouth daily. Keep a log of your blood pressure and heart rate and follow-up with your primary care provider.  Blood  pressure medication doses may change rapidly after weight loss surgery. ?  ?NON FORMULARY ?Take 1 tablet by mouth See admin instructions. unnamed Bariatric vitamin- Take 1 tablet by mouth once a day ?  ?nystatin cream ?Commonly known as: MYCOSTATIN ?Apply 1 application topically 2 (two) times daily. ?What changed:  ?when to take this ?reasons to take this ?  ?ondansetron 4 MG disintegrating tablet ?Commonly known as: ZOFRAN-ODT ?Take 1 tablet (4 mg total) by mouth every 6 (six) hours as needed for nausea or vomiting. ?  ?oxyCODONE-acetaminophen 10-325 MG tablet ?Commonly known as: PERCOCET ?Take 1 tablet by mouth in the morning, at noon, and at bedtime. ?  ?SUMAtriptan 100 MG tablet ?Commonly known as: IMITREX ?May repeat in 2 hours if headache persists or recurs.  Do not take more than 2 in 24 hours ?What changed:  ?how much to take ?how to take this ?when to take this ?reasons to take this ?additional instructions ?  ? ?  ? ? Follow-up Information   ? ? O'Neal, [place] Thomas, MD Follow up.   ?Specialties: Cardiology, Internal Medicine, Radiology ?Why: Dr, O'neal will call once results of monitor are in. ?Contact information: ?[address] ?[place] [address] ?[phone] ? ? ?  ?  ? ? [facility] Follow up.   ?Specialty:   Cardiology ?Why: our office will arrange a monitor for you to wear. ?Contact information: ?[address], [address] ?[place] ?[phone] ? ?  ?  ? ?  ?  ? ?  ? ?Feels better ?Was getting into bed at home when got hot, sweated, had chest pain with radiation to arm ?No real reflux symptoms ? ?Discharge Exam: ?Filed Weights  ? 10/25/21 2300  ?Weight: 112.3 kg  ? ?Physical Exam ?Vitals reviewed.  ?Constitutional:   ?   General: She is not in acute distress. ?   Appearance: She is not ill-appearing or toxic-appearing.  ?Cardiovascular:  ?   Rate and Rhythm: Normal rate and regular rhythm.  ?   Heart sounds: No murmur heard. ?   Comments:  Telemetry SR ?Pulmonary:  ?   Effort: Pulmonary effort is normal. No respiratory distress.  ?   Breath sounds: No wheezing, rhonchi or rales.  ?Abdominal:  ?   General: There is no distension.  ?   Tenderness: Ther

## 2021-10-26 NOTE — Assessment & Plan Note (Signed)
CPAP QHS

## 2021-10-26 NOTE — Progress Notes (Signed)
Echocardiogram ?2D Echocardiogram has been performed. ? ?Oneal Deputy Hykeem Ojeda RDCS ?10/26/2021, 8:43 AM ?

## 2021-10-26 NOTE — Assessment & Plan Note (Signed)
--  14.2 ?

## 2021-10-26 NOTE — Plan of Care (Signed)
  Problem: Clinical Measurements: Goal: Ability to maintain clinical measurements within normal limits will improve Outcome: Progressing   Problem: Coping: Goal: Level of anxiety will decrease Outcome: Progressing   Problem: Safety: Goal: Ability to remain free from injury will improve Outcome: Progressing   

## 2021-10-26 NOTE — Assessment & Plan Note (Signed)
CT showing a 9 mm pleural-based noncalcified right lower lobe lung nodule.  Discussed with the patient, she denies history of cigarette smoking. ?-Will need repeat imaging in 3 months. ?

## 2021-10-26 NOTE — Assessment & Plan Note (Signed)
1. Tachycardia during the episode, different than her ST she has had post COVID.  Was faster and she has never been that  diaphoretic before.  If cardiac CTA normal or non obstructive disease consider event monitor.   ?

## 2021-10-26 NOTE — Assessment & Plan Note (Addendum)
--  Body mass index is 42.5 kg/m?. ?Underwent bariatric surgery 6 weeks ago. ?-Encourage lifestyle modifications/weight loss. ?

## 2021-10-26 NOTE — Assessment & Plan Note (Signed)
Seen on previous labs as well and improved.  No history of heavy alcohol use.  Ultrasound done in the past showing hepatic steatosis. ?-Encourage weight loss ?

## 2021-10-26 NOTE — Hospital Course (Signed)
61 year old woman PMH hypertension, hyperlipidemia, diabetes mellitus type 2, morbid obesity status post gastric sleeve 3/14, presented with chest pain and shortness of breath.  Admitted for further evaluation and cardiology consultation. ?

## 2021-10-27 ENCOUNTER — Ambulatory Visit: Payer: Medicare Other

## 2021-10-27 DIAGNOSIS — I479 Paroxysmal tachycardia, unspecified: Secondary | ICD-10-CM

## 2021-10-27 NOTE — Progress Notes (Unsigned)
Enrolled for [employer] to mail a ZIO AT Live Telemetry monitor to patients address on file. ? ?Dr. Eleonore Chiquito to read.  ?

## 2021-11-03 ENCOUNTER — Encounter: Payer: Self-pay | Admitting: Skilled Nursing Facility1

## 2021-11-03 ENCOUNTER — Encounter: Payer: Medicare Other | Attending: Surgery | Admitting: Skilled Nursing Facility1

## 2021-11-03 DIAGNOSIS — E1165 Type 2 diabetes mellitus with hyperglycemia: Secondary | ICD-10-CM | POA: Insufficient documentation

## 2021-11-03 NOTE — Progress Notes (Signed)
Bariatric Nutrition Follow-Up Visit ?Medical Nutrition Therapy  ?Appt Start Time: 3:07   End Time: 3:50 ? ?NUTRITION ASSESSMENT ?  ?Surgery date: 09/07/2021 ?Surgery type: laparoscopic sleeve resection ? ?Anthropometrics  ?Start weight at [facility]: 274.1 lbs (date: 10/05/2020)  ?Weight: 240.3 lbs ?Height: 64 in ?BMI: 41.25 kg/m2   ?  ?Clinical  ?Medical hx: GERD, kidney stones, diabetes, restless legs, IBS ?Medications: vitamin D, ozempic, biotin, b12 gummies, collagen, tumeric, allign  ?Labs: HbA1c of 6.9 on 10/02/20, Cortisol .3, T4 .94, TSH 2.45 ?Notable signs/symptoms: nausea, migraines ?Any previous deficiencies? Vitamin D ?  ?Body Composition Scale Date ?09/21/2021 11/03/2021  ?Current Body Weight 251.5 240.3  ?Total Body Fat % 46.9 46.0  ?Visceral Fat 19   ?Fat-Free Mass % 53.0   ? Total Body Water % 41.0 41.4  ?Muscle-Mass lbs 30   ?BMI 43.1 41.1  ?Body Fat Displacement    ?       Torso  lbs 73.2   ?       Left Leg  lbs 14.6   ?       Right Leg  lbs 14.6   ?       Left Arm  lbs 7.3   ?       Right Arm   lbs 7.3   ? ? ?  ?Lifestyle & Dietary Hx ? ?Pt states she went to the ED for chest pain/ panic attack and had a hard time breathing. ?Pt states she is wearing a heart monitor. ?Pt states her dog was killed by a pit bull in front of her, and taking care of her Dad which may have caused the heart issues she experienced.  Pt states she is feeling stressed. Pt states her husbands mother is sick also. ?Pt states her insurance will pay for her to join a gym. ?Pt states she is used to suppressing her stress and emotions realizing now she wants to actually work through her emotions rather than just suppressing them. ?Pt is doing well with phase 3 food plan and excited to advance to phase 4 food plan.  ?Pt states she has been eating bananas and will  cut those out as she moves forward.  ? ?Estimated daily fluid intake: 64+ oz ?Estimated daily protein intake: 60 g ?Supplements: multi vitamin, calcium ?Current average weekly  physical activity: pt states she needs to move more  ? ?24-Hr Dietary Recall ?First Meal: boiled egg, [place] bacon ?Snack: peanut butter half banana  ?Second Meal: grilled nuggets from [employer] ?Snack:   ?Third Meal: pork, speckled butter beans, or fish ?Snack:  ?Beverages: Water ? ?Post-Op Goals/ Signs/ Symptoms ?Using straws: no ?Drinking while eating: no ?Chewing/swallowing difficulties: no ?Changes in vision: no ?Changes to mood/headaches: no ?Hair loss/changes to skin/nails: no ?Difficulty focusing/concentrating: no ?Sweating: no ?Limb weakness: no ?Dizziness/lightheadedness: no ?Palpitations: no  ?Carbonated/caffeinated beverages: no ?N/V/D/C/Gas: no ?Abdominal pain: no ?Dumping syndrome: no ? ?  ?NUTRITION DIAGNOSIS  ?Overweight/obesity ([place]-3.3) related to past poor dietary habits and physical inactivity as evidenced by completed bariatric surgery and following dietary guidelines for continued weight loss and healthy nutrition status. ?  ?  ?NUTRITION INTERVENTION ?Nutrition counseling (C-1) and education (E-2) to facilitate bariatric surgery goals, including: ?Diet advancement to the next phase (phase 4) now including non-starchy vegetables ?The importance of consuming adequate calories as well as certain nutrients daily due to the body's need for essential vitamins, minerals, and fats ?The importance of daily physical activity and to reach a goal of at least  150 minutes of moderate to vigorous physical activity weekly (or as directed by their physician) due to benefits such as increased musculature and improved lab values ?The importance of intuitive eating specifically learning hunger-satiety cues and understanding the importance of learning a new body: The importance of mindful eating to avoid grazing behaviors  ? ?Goals: ?-Continue to aim for a minimum of 64 fluid ounces 7 days a week with at least 30 ounces being plain water ? ?-Eat non-starchy vegetables 2 times a day 7 days a week ? ?-Start out  with soft cooked vegetables today and tomorrow; if tolerated begin to eat raw vegetables or cooked including salads ? ?-Eat your 3 ounces of protein first then start in on your non-starchy vegetables; once you understand how much of your meal leads to satisfaction and not full while still eating 3 ounces of protein and non-starchy vegetables you can eat them in any order  ? ?-Continue to aim for 30 minutes of activity at least 5 times a week ? ?-Do NOT cook with/add to your food: alfredo sauce, cheese sauce, barbeque sauce, ketchup, fat back, butter, bacon grease, grease, Crisco, OR SUGAR ? ? ?Handouts Provided Include  ?Phase 4 Food Plan ? ?Learning Style & Readiness for Change ?Teaching method utilized: Visual & Auditory  ?Demonstrated degree of understanding via: Teach Back  ?Readiness Level: Action ?Barriers to learning/adherence to lifestyle change: Family responsibilities ? ?RD's Notes for Next Visit ?Assess adherence to pt chosen goals ? ? ?MONITORING & EVALUATION ?Dietary intake, weekly physical activity, body weight. ? ?Next Steps ?Patient is to follow-up in July months for 6 month post-op follow-up. ?

## 2021-11-04 ENCOUNTER — Other Ambulatory Visit: Payer: Self-pay | Admitting: Family Medicine

## 2021-11-04 NOTE — Telephone Encounter (Signed)
Last refill- 09/22/2021--60 tabs, 0 refills ?Last OV- 09/21/21 ? ?No future OV scheduled ? ? ? ?

## 2021-11-05 MED ORDER — AMPHETAMINE-DEXTROAMPHETAMINE 30 MG PO TABS: ORAL_TABLET | ORAL | 0 refills | Status: DC

## 2021-11-13 ENCOUNTER — Other Ambulatory Visit: Payer: Self-pay | Admitting: Family Medicine

## 2021-11-17 ENCOUNTER — Encounter: Payer: Self-pay | Admitting: Family Medicine

## 2021-11-17 ENCOUNTER — Ambulatory Visit (INDEPENDENT_AMBULATORY_CARE_PROVIDER_SITE_OTHER): Payer: Medicare Other | Admitting: Family Medicine

## 2021-11-17 ENCOUNTER — Other Ambulatory Visit: Payer: Self-pay | Admitting: Family Medicine

## 2021-11-17 VITALS — BP 160/96 | HR 93 | Temp 98.2°F | Ht 64.0 in | Wt 235.9 lb

## 2021-11-17 DIAGNOSIS — I10 Essential (primary) hypertension: Secondary | ICD-10-CM | POA: Diagnosis not present

## 2021-11-17 DIAGNOSIS — R911 Solitary pulmonary nodule: Secondary | ICD-10-CM | POA: Diagnosis not present

## 2021-11-17 DIAGNOSIS — K760 Fatty (change of) liver, not elsewhere classified: Secondary | ICD-10-CM

## 2021-11-17 DIAGNOSIS — M961 Postlaminectomy syndrome, not elsewhere classified: Secondary | ICD-10-CM | POA: Diagnosis not present

## 2021-11-17 DIAGNOSIS — M5416 Radiculopathy, lumbar region: Secondary | ICD-10-CM | POA: Diagnosis not present

## 2021-11-17 DIAGNOSIS — F419 Anxiety disorder, unspecified: Secondary | ICD-10-CM | POA: Diagnosis not present

## 2021-11-17 DIAGNOSIS — E1165 Type 2 diabetes mellitus with hyperglycemia: Secondary | ICD-10-CM | POA: Diagnosis not present

## 2021-11-17 LAB — POCT GLYCOSYLATED HEMOGLOBIN (HGB A1C): Hemoglobin A1C: 6.6 % — AB (ref 4.0–5.6)

## 2021-11-17 MED ORDER — CITALOPRAM HYDROBROMIDE 20 MG PO TABS: 20.0000 mg | ORAL_TABLET | Freq: Every day | ORAL | 3 refills | Status: DC

## 2021-11-17 MED ORDER — BLOOD GLUCOSE METER KIT
PACK | 0 refills | Status: DC
Start: 2021-11-17 — End: 2022-10-25

## 2021-11-17 MED ORDER — LOSARTAN POTASSIUM 100 MG PO TABS: 100.0000 mg | ORAL_TABLET | Freq: Every day | ORAL | 3 refills | Status: DC

## 2021-11-17 MED ORDER — AMPHETAMINE-DEXTROAMPHETAMINE 30 MG PO TABS: ORAL_TABLET | ORAL | 0 refills | Status: DC

## 2021-11-17 NOTE — Progress Notes (Signed)
Established Patient Office Visit  Subjective   Patient ID: [name] Sherman, female    DOB: 01-27-1961  Age: 61 y.o. MRN: [id number]  Chief Complaint  Patient presents with   Follow-up    HPI   [name] is here today to discuss multiple issues.  She has had very stressful past several weeks.  About 3 weeks ago her dog was attacked by a pit bull and was killed.  Her mother-in-law was recent hospitalized and her father-in-law recent died following injury from a fall.  Her father also has significant health issues.  She has previously taken Celexa but ran out several weeks ago and is requesting refill.  She has hypertension history and is currently out of losartan.  Has tolerated well in the past.  She had recent gastric bypass surgery and has lost 54 pounds and overall feels better.  Type 2 diabetes history.  Prior to her surgery A1c was back up over 12%.  Her A1c has been controlled on Ozempic.  Needs follow-up A1c.  Not monitoring blood sugars regularly.  She had recent admission for chest pain.  Chest pain was atypical.  Coronary calcium score 7.8 which was 72nd percentile.  She had CT angiogram of the chest which showed no PE but 9 mm pleural-based noncalcified right lower lobe lung nodule.  Recommendation to consider follow-up CT chest in 3 months.  Past Medical History:  Diagnosis Date   ADD (attention deficit disorder)    Anemia    Anxiety    Arthritis    shoulder, right (arthritis, tendonitis, bursitis)   Chronic low back pain    Complication of anesthesia    Dyspnea    Fatty liver    GERD (gastroesophageal reflux disease)    History of cervical cancer    s/p  laser ablation of cervix 1980's   History of COVID-19 07/2019   History of kidney stones    Hyperlipidemia    Hypertension    Hypothyroidism    IBS (irritable bowel syndrome)    MDD (major depressive disorder)    Migraine    OSA (obstructive sleep apnea)    pt used cpap up until 2013 states lost wt and did not  need anymore   PONV (postoperative nausea and vomiting)    Restless legs    Sleep apnea    wears CPAP   Type 2 diabetes mellitus ([place])    Urgency of urination    Vertigo    Vitamin D deficiency    Vulvar cyst    Past Surgical History:  Procedure Laterality Date   BUNIONECTOMY Right 2004   COLONOSCOPY     CYSTOSCOPY W/ RETROGRADES Bilateral 05/26/2015   Procedure: CYSTOSCOPY WITH RETROGRADE PYELOGRAM;  Surgeon: Festus Aloe, MD;  Location[facility];  Service: Urology;  Laterality: Bilateral;   CYSTOSCOPY W/ URETERAL STENT REMOVAL Left 05/26/2015   Procedure: CYSTOSCOPY WITH STENT REMOVAL;  Surgeon: Festus Aloe, MD;  Location: [facility];  Service: Urology;  Laterality: Left;   CYSTOSCOPY WITH RETROGRADE PYELOGRAM, URETEROSCOPY AND STENT PLACEMENT Left 05/01/2015   Procedure: CYSTOSCOPY WITH RETROGRADE PYELOGRAM AND STENT PLACEMENT;  Surgeon: Festus Aloe, MD;  Location: [facility];  Service: Urology;  Laterality: Left;   CYSTOSCOPY WITH STENT PLACEMENT Bilateral 05/26/2015   Procedure: CYSTOSCOPY WITH STENT PLACEMENT;  Surgeon: Festus Aloe, MD;  Location[facility];  Service: Urology;  Laterality: Bilateral;   CYSTOSCOPY WITH URETEROSCOPY Bilateral 05/26/2015   Procedure: CYSTOSCOPY WITH URETEROSCOPY;  Surgeon:  Festus Aloe, MD;  Location: [facility];  Service: Urology;  Laterality: Bilateral;   CYSTOSCOPY/URETEROSCOPY/HOLMIUM LASER/STENT PLACEMENT Right 06/09/2015   Procedure: CYSTOSCOPY/URETEROSCOPY/STENT PLACEMENT REMOVAL LEFT URETERAL STENT;  Surgeon: Festus Aloe, MD;  Location: [facility];  Service: Urology;  Laterality: Right;   DIAGNOSTIC LAPAROSCOPY     HOLMIUM LASER APPLICATION Left 54/62/7035   Procedure: HOLMIUM LASER APPLICATION;  Surgeon: Festus Aloe, MD;  Location: [facility];  Service: Urology;  Laterality: Left;   HOLMIUM LASER APPLICATION Right 00/93/8182    Procedure: HOLMIUM LASER APPLICATION;  Surgeon: Festus Aloe, MD;  Location: [facility];  Service: Urology;  Laterality: Right;   INSERTION OF MESH N/A 09/05/2016   Procedure: INSERTION OF MESH;  Surgeon: [name], MD;  Location: [place];  Service: General;  Laterality: N/A;   LAPAROSCOPIC ASSISTED VAGINAL HYSTERECTOMY  04/23/2002   '@[facility]'$   by Dr Willis Modena;  With Left Salpingoophorectomy/  Anterior Repair/  Tension Free Tape Sling placement   LAPAROSCOPIC GASTRIC SLEEVE RESECTION N/A 09/07/2021   Procedure: LAPAROSCOPIC GASTRIC SLEEVE RESECTION;  Surgeon: [name], MD;  Location: [facility];  Service: General;  Laterality: N/A;   LASER ABLATION OF THE CERVIX  x2  1980's   POSTERIOR LAMINECTOMY / DECOMPRESSION LUMBAR SPINE  02/03/2020   '@[facility]'$   Dr Ellene Route;   L4--5 and fusion   POSTERIOR LUMBAR FUSION  01/21/2019   '@[facility]'$  by Dr Ellene Route;   L5--S1   TUBAL LIGATION  1980's   UPPER GI ENDOSCOPY N/A 09/07/2021   Procedure: UPPER GI ENDOSCOPY;  Surgeon: [name], MD;  Location: [facility];  Service: General;  Laterality: N/A;   VENTRAL HERNIA REPAIR N/A 09/05/2016   Procedure: LAPAROSCOPIC VENTRAL HERNIA;  Surgeon: [name], MD;  Location: [place];  Service: General;  Laterality: N/A;    reports that she has never smoked. She has never used smokeless tobacco. She reports current alcohol use. She reports that she does not use drugs. family history includes Alcohol abuse in her mother; Arthritis in her father; COPD in her father; Cancer in her maternal grandmother and paternal grandmother; Cirrhosis in her sister; Colon cancer in her maternal grandmother and mother; Heart disease in her father; Hyperlipidemia in her father; Hypertension in her father; Kidney cancer in her sister; Kidney disease in her paternal uncle; Lung cancer in her mother; Rectal cancer in her maternal grandmother. Allergies  Allergen Reactions   Keflex [Cephalexin] Swelling and Other (See Comments)    Tongue swelling     Review of Systems  Constitutional:  Negative for chills and fever.  Respiratory:  Negative for cough and shortness of breath.   Cardiovascular:  Negative for chest pain.  Gastrointestinal:  Negative for nausea and vomiting.  Genitourinary:  Negative for dysuria.  Psychiatric/Behavioral:  The patient is nervous/anxious and has insomnia.      Objective:     BP (!) 160/96 (BP Location: Left Arm, Patient Position: Sitting, Cuff Size: Normal)   Pulse 93   Temp 98.2 F (36.8 C) (Oral)   Ht '5\' 4"'$  (1.626 m)   Wt 235 lb 14.4 oz (107 kg)   SpO2 97%   BMI 40.49 kg/m    Physical Exam Vitals reviewed.  Cardiovascular:     Rate and Rhythm: Normal rate and regular rhythm.  Pulmonary:     Effort: Pulmonary effort is normal.     Breath sounds: Normal breath sounds.  Neurological:     Mental Status: She is alert.  Results for orders placed or performed in visit on 11/17/21  POCT glycosylated hemoglobin (Hb A1C)  Result Value Ref Range   Hemoglobin A1C 6.6 (A) 4.0 - 5.6 %   HbA1c POC (<> result, manual entry)     HbA1c, POC (prediabetic range)     HbA1c, POC (controlled diabetic range)        The 10-year ASCVD risk score (Arnett DK, et al., 2019) is: 11%    Assessment & Plan:   #1 type 2 diabetes greatly improved with her recent weight loss following surgery with A1c today 6.6%. -This should continue to improve further with her additional weight loss  #2 chronic anxiety symptoms.  Multiple recent situational stressors as above.  Get back on Celexa 20 mg daily with prescription sent  #3 hypertension.  Poorly controlled.  Probably exacerbated by stress.  Patient currently out of losartan.  Refill losartan 100 mg daily.  Set up 1 month follow-up to reassess.  #4 recent note of 9 mm right lower lobe lung nodule.  Setting up 82-monthfollow-up CT chest to further assess.  If growing at that time will need biopsy   Return in about 1 month (around 12/18/2021).    BCarolann Littler MD

## 2021-11-17 NOTE — Patient Instructions (Signed)
We need to get repeat CT chest in 3 months  Get back on the Citalopram and Losartan and set up one month follow up.

## 2021-11-18 LAB — HM MAMMOGRAPHY

## 2021-11-23 ENCOUNTER — Encounter: Payer: Self-pay | Admitting: Family Medicine

## 2021-11-26 DIAGNOSIS — G4733 Obstructive sleep apnea (adult) (pediatric): Secondary | ICD-10-CM | POA: Diagnosis not present

## 2021-12-03 DIAGNOSIS — M5416 Radiculopathy, lumbar region: Secondary | ICD-10-CM | POA: Diagnosis not present

## 2021-12-08 ENCOUNTER — Other Ambulatory Visit: Payer: Self-pay

## 2021-12-08 DIAGNOSIS — G4733 Obstructive sleep apnea (adult) (pediatric): Secondary | ICD-10-CM

## 2021-12-08 MED ORDER — ATORVASTATIN CALCIUM 10 MG PO TABS: 10.0000 mg | ORAL_TABLET | Freq: Every day | ORAL | 3 refills | Status: DC

## 2021-12-08 NOTE — Addendum Note (Signed)
Addended by: Nilda Riggs on: 12/08/2021 04:28 PM   Modules accepted: Orders

## 2021-12-08 NOTE — Telephone Encounter (Signed)
Referral placed and left vm informing the pt

## 2021-12-08 NOTE — Telephone Encounter (Signed)
I spoke with the pt in regards to Orders for [employer] for CPAP and she stated she does not know current pressure setting. Pt inquired if she can have referral for sleep study as she states she has lost weight and does not think CPAP is necessary.

## 2021-12-17 ENCOUNTER — Ambulatory Visit
Admission: RE | Admit: 2021-12-17 | Discharge: 2021-12-17 | Disposition: A | Discharge status: Home / Self Care | Payer: Medicare Other | Source: Ambulatory Visit | Attending: Family Medicine | Admitting: Family Medicine

## 2021-12-17 DIAGNOSIS — R413 Other amnesia: Secondary | ICD-10-CM

## 2021-12-17 DIAGNOSIS — R410 Disorientation, unspecified: Secondary | ICD-10-CM | POA: Diagnosis not present

## 2021-12-20 ENCOUNTER — Ambulatory Visit: Payer: Medicare Other | Admitting: Family Medicine

## 2022-01-03 ENCOUNTER — Other Ambulatory Visit: Payer: Self-pay | Admitting: Family Medicine

## 2022-01-03 MED ORDER — AMPHETAMINE-DEXTROAMPHETAMINE 30 MG PO TABS
ORAL_TABLET | ORAL | 0 refills | Status: DC
Start: 2022-01-03 — End: 2022-02-03

## 2022-01-04 ENCOUNTER — Encounter: Payer: Self-pay | Admitting: Family Medicine

## 2022-01-04 ENCOUNTER — Ambulatory Visit (INDEPENDENT_AMBULATORY_CARE_PROVIDER_SITE_OTHER): Payer: Medicare Other | Admitting: Family Medicine

## 2022-01-04 VITALS — BP 110/64 | HR 82 | Temp 97.0°F | Ht 64.0 in | Wt 218.5 lb

## 2022-01-04 DIAGNOSIS — R419 Unspecified symptoms and signs involving cognitive functions and awareness: Secondary | ICD-10-CM | POA: Diagnosis not present

## 2022-01-04 DIAGNOSIS — I10 Essential (primary) hypertension: Secondary | ICD-10-CM | POA: Diagnosis not present

## 2022-01-04 DIAGNOSIS — E1165 Type 2 diabetes mellitus with hyperglycemia: Secondary | ICD-10-CM

## 2022-01-04 NOTE — Progress Notes (Signed)
Established Patient Office Visit  Subjective   Patient ID: [name] Sherman, female    DOB: 19-Feb-1961  Age: 61 y.o. MRN: [id number]  Chief Complaint  Patient presents with   Follow-up   Results    HPI   [name] Sherman is seen to go over her recent MRI.  She had COVID infection several months ago.  She was concerned that she had some cognitive changes following that infection.  This was at least the second time she had COVID.  She had a particularly severe infection 2020 and felt like she has had some memory deficits since then.  MRI showed no acute findings.  There was comment of "sequelae of chronic white matter microangiopathy with overall mild burden ".  Interpreted as unremarkable for age brain MRI.  No evidence for prior CVA.  No tumors.  Patient had recent gastric bypass surgery.  She has continued to lose weight since her surgery and is very pleased overall.  Feels much better overall.  Blood sugars have improved tremendously with recent A1c low 6 range.  Infrequently taking insulin.  Her blood pressure is also improved and currently just taking losartan.  No dizziness.  She hopes to eventually get her weight down to around 180 pounds.  Past Medical History:  Diagnosis Date   ADD (attention deficit disorder)    Anemia    Anxiety    Arthritis    shoulder, right (arthritis, tendonitis, bursitis)   Chronic low back pain    Complication of anesthesia    Dyspnea    Fatty liver    GERD (gastroesophageal reflux disease)    History of cervical cancer    s/p  laser ablation of cervix 1980's   History of COVID-19 07/2019   History of kidney stones    Hyperlipidemia    Hypertension    Hypothyroidism    IBS (irritable bowel syndrome)    MDD (major depressive disorder)    Migraine    OSA (obstructive sleep apnea)    pt used cpap up until 2013 states lost wt and did not need anymore   PONV (postoperative nausea and vomiting)    Restless legs    Sleep apnea    wears CPAP   Type 2  diabetes mellitus ([place])    Urgency of urination    Vertigo    Vitamin D deficiency    Vulvar cyst    Past Surgical History:  Procedure Laterality Date   BUNIONECTOMY Right 2004   COLONOSCOPY     CYSTOSCOPY W/ RETROGRADES Bilateral 05/26/2015   Procedure: CYSTOSCOPY WITH RETROGRADE PYELOGRAM;  Surgeon: Festus Aloe, MD;  Location: [facility];  Service: Urology;  Laterality: Bilateral;   CYSTOSCOPY W/ URETERAL STENT REMOVAL Left 05/26/2015   Procedure: CYSTOSCOPY WITH STENT REMOVAL;  Surgeon: Festus Aloe, MD;  Location: [facility];  Service: Urology;  Laterality: Left;   CYSTOSCOPY WITH RETROGRADE PYELOGRAM, URETEROSCOPY AND STENT PLACEMENT Left 05/01/2015   Procedure: CYSTOSCOPY WITH RETROGRADE PYELOGRAM AND STENT PLACEMENT;  Surgeon: Festus Aloe, MD;  Location: [facility];  Service: Urology;  Laterality: Left;   CYSTOSCOPY WITH STENT PLACEMENT Bilateral 05/26/2015   Procedure: CYSTOSCOPY WITH STENT PLACEMENT;  Surgeon: Festus Aloe, MD;  Location[facility];  Service: Urology;  Laterality: Bilateral;   CYSTOSCOPY WITH URETEROSCOPY Bilateral 05/26/2015   Procedure: CYSTOSCOPY WITH URETEROSCOPY;  Surgeon: Festus Aloe, MD;  Location: [facility];  Service: Urology;  Laterality: Bilateral;   CYSTOSCOPY/URETEROSCOPY/HOLMIUM LASER/STENT PLACEMENT Right 06/09/2015  Procedure: CYSTOSCOPY/URETEROSCOPY/STENT PLACEMENT REMOVAL LEFT URETERAL STENT;  Surgeon: Festus Aloe, MD;  Location: [facility];  Service: Urology;  Laterality: Right;   DIAGNOSTIC LAPAROSCOPY     HOLMIUM LASER APPLICATION Left 10/62/6948   Procedure: HOLMIUM LASER APPLICATION;  Surgeon: Festus Aloe, MD;  Location: [facility];  Service: Urology;  Laterality: Left;   HOLMIUM LASER APPLICATION Right 54/62/7035   Procedure: HOLMIUM LASER APPLICATION;  Surgeon: Festus Aloe, MD;  Location: [facility];  Service: Urology;  Laterality:  Right;   INSERTION OF MESH N/A 09/05/2016   Procedure: INSERTION OF MESH;  Surgeon: [name], MD;  Location: [place];  Service: General;  Laterality: N/A;   LAPAROSCOPIC ASSISTED VAGINAL HYSTERECTOMY  04/23/2002   '@[facility]'$   by Dr Willis Modena;  With Left Salpingoophorectomy/  Anterior Repair/  Tension Free Tape Sling placement   LAPAROSCOPIC GASTRIC SLEEVE RESECTION N/A 09/07/2021   Procedure: LAPAROSCOPIC GASTRIC SLEEVE RESECTION;  Surgeon: [name], MD;  Location: [facility];  Service: General;  Laterality: N/A;   LASER ABLATION OF THE CERVIX  x2  1980's   POSTERIOR LAMINECTOMY / DECOMPRESSION LUMBAR SPINE  02/03/2020   '@[facility]'$   Dr Ellene Route;   L4--5 and fusion   POSTERIOR LUMBAR FUSION  01/21/2019   '@[facility]'$  by Dr Ellene Route;   L5--S1   TUBAL LIGATION  1980's   UPPER GI ENDOSCOPY N/A 09/07/2021   Procedure: UPPER GI ENDOSCOPY;  Surgeon: [name], MD;  Location: [facility];  Service: General;  Laterality: N/A;   VENTRAL HERNIA REPAIR N/A 09/05/2016   Procedure: LAPAROSCOPIC VENTRAL HERNIA;  Surgeon: [name], MD;  Location: [place];  Service: General;  Laterality: N/A;    reports that she has never smoked. She has never used smokeless tobacco. She reports current alcohol use. She reports that she does not use drugs. family history includes Alcohol abuse in her mother; Arthritis in her father; COPD in her father; Cancer in her maternal grandmother and paternal grandmother; Cirrhosis in her sister; Colon cancer in her maternal grandmother and mother; Heart disease in her father; Hyperlipidemia in her father; Hypertension in her father; Kidney cancer in her sister; Kidney disease in her paternal uncle; Lung cancer in her mother; Rectal cancer in her maternal grandmother. Allergies  Allergen Reactions   Keflex [Cephalexin] Swelling and Other (See Comments)    Tongue swelling    Review of Systems  Constitutional:  Negative for malaise/fatigue.  Eyes:  Negative for blurred vision.  Respiratory:   Negative for shortness of breath.   Cardiovascular:  Negative for chest pain.  Neurological:  Negative for dizziness, weakness and headaches.      Objective:     BP 110/64 (BP Location: Left Arm, Patient Position: Sitting, Cuff Size: Normal)   Pulse 82   Temp (!) 97 F (36.1 C) (Oral)   Ht '5\' 4"'$  (1.626 m)   Wt 218 lb 8 oz (99.1 kg)   SpO2 96%   BMI 37.51 kg/m  BP Readings from Last 3 Encounters:  01/04/22 110/64  11/17/21 (!) 160/96  10/26/21 136/75   Wt Readings from Last 3 Encounters:  01/04/22 218 lb 8 oz (99.1 kg)  11/17/21 235 lb 14.4 oz (107 kg)  11/04/21 240 lb 4.8 oz (109 kg)      Physical Exam Constitutional:      Appearance: She is well-developed.  Eyes:     Pupils: Pupils are equal, round, and reactive to light.  Neck:     Thyroid: No thyromegaly.  Vascular: No JVD.  Cardiovascular:     Rate and Rhythm: Normal rate and regular rhythm.     Heart sounds:     No gallop.  Pulmonary:     Effort: Pulmonary effort is normal. No respiratory distress.     Breath sounds: Normal breath sounds. No wheezing or rales.  Musculoskeletal:     Cervical back: Neck supple.  Neurological:     Mental Status: She is alert.      No results found for any visits on 01/04/22.    The 10-year ASCVD risk score (Arnett DK, et al., 2019) is: 5.3%    Assessment & Plan:   #1 recent cognitive concerns.  MRI did not show any concerning findings for age.  Reassurance given.  We reviewed details of her MRI.  #2 type 2 diabetes improving tremendously with weight loss following gastric bypass surgery.  Recent A1c 6.2.  Recheck in September and we will plan repeat A1c at that time  #3 hypertension improving with her weight loss.  Continue losartan for now.  We will likely need to reduce dose or possibly discontinue altogether with additional weight loss.  Hopefully she can start to pick up exercise soon  Return in about 2 months (around 03/07/2022).    Carolann Littler,  MD

## 2022-01-07 ENCOUNTER — Other Ambulatory Visit (HOSPITAL_COMMUNITY): Payer: Self-pay

## 2022-01-07 MED ORDER — SEMAGLUTIDE-WEIGHT MANAGEMENT 0.25 MG/0.5ML ~~LOC~~ SOAJ
SUBCUTANEOUS | 1 refills | Status: DC
  Filled 2022-01-07: qty 2, 28d supply, fill #0

## 2022-01-10 ENCOUNTER — Other Ambulatory Visit (HOSPITAL_COMMUNITY): Payer: Self-pay

## 2022-01-13 ENCOUNTER — Other Ambulatory Visit (HOSPITAL_COMMUNITY): Payer: Self-pay

## 2022-01-24 ENCOUNTER — Ambulatory Visit: Payer: Medicare Other | Admitting: Skilled Nursing Facility1

## 2022-01-25 DIAGNOSIS — M5416 Radiculopathy, lumbar region: Secondary | ICD-10-CM | POA: Diagnosis not present

## 2022-01-25 DIAGNOSIS — M961 Postlaminectomy syndrome, not elsewhere classified: Secondary | ICD-10-CM | POA: Diagnosis not present

## 2022-02-03 ENCOUNTER — Encounter: Payer: Self-pay | Admitting: Family Medicine

## 2022-02-03 ENCOUNTER — Other Ambulatory Visit: Payer: Self-pay | Admitting: Family Medicine

## 2022-02-04 MED ORDER — AMPHETAMINE-DEXTROAMPHETAMINE 30 MG PO TABS: ORAL_TABLET | ORAL | 0 refills | Status: DC

## 2022-02-04 MED ORDER — AMPHETAMINE-DEXTROAMPHETAMINE 30 MG PO TABS: 30.0000 mg | ORAL_TABLET | Freq: Every day | ORAL | 0 refills | Status: DC

## 2022-02-04 NOTE — Telephone Encounter (Signed)
I sent in an additional 2 months

## 2022-02-08 ENCOUNTER — Ambulatory Visit: Payer: Medicare Other | Admitting: Skilled Nursing Facility1

## 2022-02-09 ENCOUNTER — Ambulatory Visit: Payer: Medicare Other | Admitting: Family Medicine

## 2022-02-09 ENCOUNTER — Ambulatory Visit (INDEPENDENT_AMBULATORY_CARE_PROVIDER_SITE_OTHER): Payer: Medicare Other | Admitting: Family Medicine

## 2022-02-09 ENCOUNTER — Encounter: Payer: Self-pay | Admitting: Family Medicine

## 2022-02-09 VITALS — BP 120/70 | Temp 98.2°F | Ht 64.0 in | Wt 211.5 lb

## 2022-02-09 DIAGNOSIS — E039 Hypothyroidism, unspecified: Secondary | ICD-10-CM | POA: Diagnosis not present

## 2022-02-09 DIAGNOSIS — R911 Solitary pulmonary nodule: Secondary | ICD-10-CM | POA: Diagnosis not present

## 2022-02-09 DIAGNOSIS — E118 Type 2 diabetes mellitus with unspecified complications: Secondary | ICD-10-CM | POA: Diagnosis not present

## 2022-02-09 DIAGNOSIS — K760 Fatty (change of) liver, not elsewhere classified: Secondary | ICD-10-CM | POA: Diagnosis not present

## 2022-02-09 DIAGNOSIS — E785 Hyperlipidemia, unspecified: Secondary | ICD-10-CM

## 2022-02-09 DIAGNOSIS — R238 Other skin changes: Secondary | ICD-10-CM

## 2022-02-09 MED ORDER — LOSARTAN POTASSIUM 50 MG PO TABS: 50.0000 mg | ORAL_TABLET | Freq: Every day | ORAL | 3 refills | Status: DC

## 2022-02-09 MED ORDER — NYSTATIN 100000 UNIT/GM EX CREA: 1.0000 | TOPICAL_CREAM | Freq: Two times a day (BID) | CUTANEOUS | 1 refills | Status: DC

## 2022-02-09 NOTE — Progress Notes (Signed)
Established Patient Office Visit  Subjective   Patient ID: WHITNEE ORZEL, female    DOB: 06/05/1961  Age: 61 y.o. MRN: [id number]  Chief Complaint  Patient presents with   Blister    Patient complains of blister on back, x1 day    HPI   Yuliana is here with concern for possible shingles left lower back.  She has some chronic intermittent back pain.  She recently got a new thermal pad which she used on her back and she states this gets quite hot at times.  She had little bit of soreness and now mostly itching.  She was with her father at a doctor's appointment earlier and his primary care physician thought this may be shingles.  She has not had shingles vaccine.  No significant pain at this time.  Mild pruritus.  She has history of migraine headaches, hypertension, morbid obesity, obstructive sleep apnea, type 2 diabetes, hypothyroidism, restless legs, elevated liver transaminases with history of hepatic steatosis She had recent gastric bypass and has done extremely well and steadily and consistently losing weight.  Her blood sugars are dramatically improved.  Blood pressures have continue to improve.  She remains on losartan 100 mg daily.  Occasional lightheadedness and dizziness with standing.  We discussed that we would need to taper this back at some point  She has hypothyroidism and is on levothyroxine 50 mcg daily.  She also has hyperlipidemia treated with atorvastatin.  Requesting future labs.  He has had some recurrent fungal issues due to skin folds holding moisture in her lower abdominal region.  Has improved some with nystatin and requesting refill.  She had CT angiogram of the chest back May 1.  This showed 9 mm noncalcified right lower lobe lung nodule which was pleural-based with recommended 76-monthfollow-up with repeat CT. this has already been ordered but patient states she has not been contacted yet  Past Medical History:  Diagnosis Date   ADD (attention deficit  disorder)    Anemia    Anxiety    Arthritis    shoulder, right (arthritis, tendonitis, bursitis)   Chronic low back pain    Complication of anesthesia    Dyspnea    Fatty liver    GERD (gastroesophageal reflux disease)    History of cervical cancer    s/p  laser ablation of cervix 1980's   History of COVID-19 07/2019   History of kidney stones    Hyperlipidemia    Hypertension    Hypothyroidism    IBS (irritable bowel syndrome)    MDD (major depressive disorder)    Migraine    OSA (obstructive sleep apnea)    pt used cpap up until 2013 states lost wt and did not need anymore   PONV (postoperative nausea and vomiting)    Restless legs    Sleep apnea    wears CPAP   Type 2 diabetes mellitus ([place]    Urgency of urination    Vertigo    Vitamin D deficiency    Vulvar cyst    Past Surgical History:  Procedure Laterality Date   BUNIONECTOMY Right 2004   COLONOSCOPY     CYSTOSCOPY W/ RETROGRADES Bilateral 05/26/2015   Procedure: CYSTOSCOPY WITH RETROGRADE PYELOGRAM;  Surgeon: MFestus Aloe MD;  [facility]  Service: Urology;  Laterality: Bilateral;   CYSTOSCOPY W/ URETERAL STENT REMOVAL Left 05/26/2015   Procedure: CYSTOSCOPY WITH STENT REMOVAL;  Surgeon: MFestus Aloe MD;  [facility]  Service: Urology;  Laterality: Left;   CYSTOSCOPY WITH RETROGRADE PYELOGRAM, URETEROSCOPY AND STENT PLACEMENT Left 05/01/2015   Procedure: CYSTOSCOPY WITH RETROGRADE PYELOGRAM AND STENT PLACEMENT;  Surgeon: Festus Aloe, MD;  Location[facility];  Service: Urology;  Laterality: Left;   CYSTOSCOPY WITH STENT PLACEMENT Bilateral 05/26/2015   Procedure: CYSTOSCOPY WITH STENT PLACEMENT;  Surgeon: Festus Aloe, MD;  Location: [facility];  Service: Urology;  Laterality: Bilateral;   CYSTOSCOPY WITH URETEROSCOPY Bilateral 05/26/2015   Procedure: CYSTOSCOPY WITH URETEROSCOPY;  Surgeon: Festus Aloe, MD;  Location: [facility];  Service: Urology;  Laterality: Bilateral;   CYSTOSCOPY/URETEROSCOPY/HOLMIUM LASER/STENT PLACEMENT Right 06/09/2015   Procedure: CYSTOSCOPY/URETEROSCOPY/STENT PLACEMENT REMOVAL LEFT URETERAL STENT;  Surgeon: Festus Aloe, MD;  Location[facility];  Service: Urology;  Laterality: Right;   DIAGNOSTIC LAPAROSCOPY     HOLMIUM LASER APPLICATION Left 93/57/0177   Procedure: HOLMIUM LASER APPLICATION;  Surgeon: Festus Aloe, MD;  Location: [facility];  Service: Urology;  Laterality: Left;   HOLMIUM LASER APPLICATION Right 93/90/3009   Procedure: HOLMIUM LASER APPLICATION;  Surgeon: Festus Aloe, MD;  Location: [facility];  Service: Urology;  Laterality: Right;   INSERTION OF MESH N/A 09/05/2016   Procedure: INSERTION OF MESH;  Surgeon: [name], MD;  Location: [place];  Service: General;  Laterality: N/A;   LAPAROSCOPIC ASSISTED VAGINAL HYSTERECTOMY  04/23/2002   '@[facility]'$   by Dr Willis Modena;  With Left Salpingoophorectomy/  Anterior Repair/  Tension Free Tape Sling placement   LAPAROSCOPIC GASTRIC SLEEVE RESECTION N/A 09/07/2021   Procedure: LAPAROSCOPIC GASTRIC SLEEVE RESECTION;  Surgeon: [name], MD;  Location[facility];  Service: General;  Laterality: N/A;   LASER ABLATION OF THE CERVIX  x2  1980's   POSTERIOR LAMINECTOMY / DECOMPRESSION LUMBAR SPINE  02/03/2020   '@[facility]'$   Dr Ellene Route;   L4--5 and fusion   POSTERIOR LUMBAR FUSION  01/21/2019   '@[facility]'$  by Dr Ellene Route;   L5--S1   TUBAL LIGATION  1980's   UPPER GI ENDOSCOPY N/A 09/07/2021   Procedure: UPPER GI ENDOSCOPY;  Surgeon: [name], MD;  Location: [facility];  Service: General;  Laterality: N/A;   VENTRAL HERNIA REPAIR N/A 09/05/2016   Procedure: LAPAROSCOPIC VENTRAL HERNIA;  Surgeon: [name], MD;  Location: [place];  Service: General;  Laterality: N/A;    reports that she has never smoked. She has never used smokeless tobacco. She reports current alcohol use. She reports that she does not use  drugs. family history includes Alcohol abuse in her mother; Arthritis in her father; COPD in her father; Cancer in her maternal grandmother and paternal grandmother; Cirrhosis in her sister; Colon cancer in her maternal grandmother and mother; Heart disease in her father; Hyperlipidemia in her father; Hypertension in her father; Kidney cancer in her sister; Kidney disease in her paternal uncle; Lung cancer in her mother; Rectal cancer in her maternal grandmother. Allergies  Allergen Reactions   Keflex [Cephalexin] Swelling and Other (See Comments)    Tongue swelling     Review of Systems  Constitutional:  Negative for malaise/fatigue.  Eyes:  Negative for blurred vision.  Respiratory:  Negative for shortness of breath.   Cardiovascular:  Negative for chest pain.  Genitourinary:  Negative for dysuria.  Neurological:  Negative for dizziness, weakness and headaches.      Objective:     BP 120/70 (BP Location: Left Arm, Patient Position: Sitting, Cuff Size: Normal)   Temp 98.2 F (36.8 C) (Oral)  Ht '5\' 4"'$  (1.626 m)   Wt 211 lb 8 oz (95.9 kg)   BMI 36.30 kg/m  BP Readings from Last 3 Encounters:  02/09/22 120/70  01/04/22 110/64  11/17/21 (!) 160/96   Wt Readings from Last 3 Encounters:  02/09/22 211 lb 8 oz (95.9 kg)  01/04/22 218 lb 8 oz (99.1 kg)  11/17/21 235 lb 14.4 oz (107 kg)      Physical Exam Vitals reviewed.  Cardiovascular:     Rate and Rhythm: Normal rate and regular rhythm.  Pulmonary:     Effort: Pulmonary effort is normal.     Breath sounds: Normal breath sounds.  Skin:    Comments: She had a couple small vesicles on her left lower back region.  1 is intact and 1 has ruptured.  These do not seem to follow dermatome distribution.  No surrounding erythema.  Neurological:     Mental Status: She is alert.      No results found for any visits on 02/09/22.    The 10-year ASCVD risk score (Arnett DK, et al., 2019) is: 6.3%    Assessment & Plan:    #1 she has a couple small vesicles left lower back.  Suspect this may actually represent thermal burn from recent heating pad instead of shingles.  No associated pain at this time.  Recommend keep clean with soap and water and watch for signs of secondary infection.  No indication for Valtrex.  #2 hypertension.  Improved with recent weight loss.  Recommend reduce losartan from 100 to 50 mg daily.  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  Refill nystatin for as needed use  #4 hypothyroidism.  Patient on low-dose levothyroxine.  Future lab order placed for TSH  #5 hyperlipidemia.  Patient requesting fasting lipids.  We wrote for fasting lipid panel and about 2 to 3 months  #6 fatty liver changes with elevated liver transaminases.  These have been trending down and will suspect they will continue to get better with her weight loss.  Future lab order for hepatic panel  #7   9 mm right lower lobe nodule noted on CT angiogram chest back in May with recommended 1-monthfollow-up.  We will reorder CT chest without contrast we have asked that she contact [place] if she has not heard back  No follow-ups on file.    BCarolann Littler MD

## 2022-02-18 ENCOUNTER — Other Ambulatory Visit: Payer: Self-pay | Admitting: Family Medicine

## 2022-02-18 ENCOUNTER — Telehealth: Payer: Self-pay | Admitting: Family Medicine

## 2022-02-18 MED ORDER — LEVOTHYROXINE SODIUM 50 MCG PO TABS: 50.0000 ug | ORAL_TABLET | Freq: Every day | ORAL | 3 refills | Status: DC

## 2022-02-18 NOTE — Telephone Encounter (Signed)
Rx sent 

## 2022-02-18 NOTE — Telephone Encounter (Signed)
Pt would like  levothyroxine (SYNTHROID) 50 MCG tablet send to new pharm  [id number], [place] [address] Phone:  [phone]  Fax:  [phone]

## 2022-03-01 DIAGNOSIS — M25512 Pain in left shoulder: Secondary | ICD-10-CM | POA: Diagnosis not present

## 2022-03-01 DIAGNOSIS — M7502 Adhesive capsulitis of left shoulder: Secondary | ICD-10-CM | POA: Diagnosis not present

## 2022-03-07 ENCOUNTER — Encounter: Payer: Self-pay | Admitting: Family Medicine

## 2022-03-07 ENCOUNTER — Ambulatory Visit (INDEPENDENT_AMBULATORY_CARE_PROVIDER_SITE_OTHER): Payer: Medicare Other | Admitting: Family Medicine

## 2022-03-07 VITALS — BP 128/80 | HR 90 | Temp 98.0°F | Ht 64.0 in | Wt 204.1 lb

## 2022-03-07 DIAGNOSIS — M5416 Radiculopathy, lumbar region: Secondary | ICD-10-CM | POA: Diagnosis not present

## 2022-03-07 DIAGNOSIS — I10 Essential (primary) hypertension: Secondary | ICD-10-CM

## 2022-03-07 DIAGNOSIS — R911 Solitary pulmonary nodule: Secondary | ICD-10-CM | POA: Diagnosis not present

## 2022-03-07 DIAGNOSIS — E118 Type 2 diabetes mellitus with unspecified complications: Secondary | ICD-10-CM

## 2022-03-07 DIAGNOSIS — K760 Fatty (change of) liver, not elsewhere classified: Secondary | ICD-10-CM | POA: Diagnosis not present

## 2022-03-07 DIAGNOSIS — E1165 Type 2 diabetes mellitus with hyperglycemia: Secondary | ICD-10-CM

## 2022-03-07 DIAGNOSIS — R7401 Elevation of levels of liver transaminase levels: Secondary | ICD-10-CM | POA: Diagnosis not present

## 2022-03-07 LAB — POCT GLYCOSYLATED HEMOGLOBIN (HGB A1C): Hemoglobin A1C: 6 % — AB (ref 4.0–5.6)

## 2022-03-07 NOTE — Progress Notes (Signed)
Established Patient Office Visit  Subjective   Patient ID: [name] Sherman, female    DOB: 1960-08-04  Age: 61 y.o. MRN: [id number]  Chief Complaint  Patient presents with   Follow-up    HPI   [name] Sherman is here to discuss multiple items as follows.  She had CT angiogram of chest in May and this showed 9 mm noncalcified right lower lobe lung nodule with recommended follow-up within a few months.  We order her CAT scan a month ago but she never heard back from anyone.  She denies any cough.  She is losing weight but had gastric bypass surgery and relates her weight loss to that.  Her weight loss has been very gradual and steady and she feels much better overall.  History of elevated liver transaminases.  Known hepatic steatosis.  These were trending down with her weight loss.  She recently went to donate plasma and was told that she had record of hepatitis B infection 30 some years ago and could not donate.  We have no knowledge whatsoever that and cannot see any prior documentation of prior hepatitis B status.  She is not aware of prior hepatitis B vaccination.  She has type 2 diabetes.  Her A1c had climbed as high as 12 range back last winter prior to her surgery.  She has done amazingly well since then.  Last A1c was 6.6% and down to 6.0 today.  She has hypertension and remains on losartan 50 mg daily.  Occasional lightheadedness.  We had explained that she may need to come off blood pressure medication with her weight loss.  Past Medical History:  Diagnosis Date   ADD (attention deficit disorder)    Anemia    Anxiety    Arthritis    shoulder, right (arthritis, tendonitis, bursitis)   Chronic low back pain    Complication of anesthesia    Dyspnea    Fatty liver    GERD (gastroesophageal reflux disease)    History of cervical cancer    s/p  laser ablation of cervix 1980's   History of COVID-19 07/2019   History of kidney stones    Hyperlipidemia    Hypertension     Hypothyroidism    IBS (irritable bowel syndrome)    MDD (major depressive disorder)    Migraine    OSA (obstructive sleep apnea)    pt used cpap up until 2013 states lost wt and did not need anymore   PONV (postoperative nausea and vomiting)    Restless legs    Sleep apnea    wears CPAP   Type 2 diabetes mellitus ([place])    Urgency of urination    Vertigo    Vitamin D deficiency    Vulvar cyst    Past Surgical History:  Procedure Laterality Date   BUNIONECTOMY Right 2004   COLONOSCOPY     CYSTOSCOPY W/ RETROGRADES Bilateral 05/26/2015   Procedure: CYSTOSCOPY WITH RETROGRADE PYELOGRAM;  Surgeon: Festus Aloe, MD;  Location: [facility];  Service: Urology;  Laterality: Bilateral;   CYSTOSCOPY W/ URETERAL STENT REMOVAL Left 05/26/2015   Procedure: CYSTOSCOPY WITH STENT REMOVAL;  Surgeon: Festus Aloe, MD;  Location[facility];  Service: Urology;  Laterality: Left;   CYSTOSCOPY WITH RETROGRADE PYELOGRAM, URETEROSCOPY AND STENT PLACEMENT Left 05/01/2015   Procedure: CYSTOSCOPY WITH RETROGRADE PYELOGRAM AND STENT PLACEMENT;  Surgeon: Festus Aloe, MD;  Location[facility];  Service: Urology;  Laterality: Left;   [place]  Bilateral 05/26/2015   Procedure: CYSTOSCOPY WITH STENT PLACEMENT;  Surgeon: Festus Aloe, MD;  Location: [facility];  Service: Urology;  Laterality: Bilateral;   CYSTOSCOPY WITH URETEROSCOPY Bilateral 05/26/2015   Procedure: CYSTOSCOPY WITH URETEROSCOPY;  Surgeon: Festus Aloe, MD;  Location: [facility];  Service: Urology;  Laterality: Bilateral;   CYSTOSCOPY/URETEROSCOPY/HOLMIUM LASER/STENT PLACEMENT Right 06/09/2015   Procedure: CYSTOSCOPY/URETEROSCOPY/STENT PLACEMENT REMOVAL LEFT URETERAL STENT;  Surgeon: Festus Aloe, MD;  Location[facility];  Service: Urology;  Laterality: Right;   DIAGNOSTIC LAPAROSCOPY     HOLMIUM LASER APPLICATION Left 67/67/2094   Procedure:  HOLMIUM LASER APPLICATION;  Surgeon: Festus Aloe, MD;  Location: [facility];  Service: Urology;  Laterality: Left;   HOLMIUM LASER APPLICATION Right 70/96/2836   Procedure: HOLMIUM LASER APPLICATION;  Surgeon: Festus Aloe, MD;  Location: [facility];  Service: Urology;  Laterality: Right;   INSERTION OF MESH N/A 09/05/2016   Procedure: INSERTION OF MESH;  Surgeon: [name], MD;  Location: [place];  Service: General;  Laterality: N/A;   LAPAROSCOPIC ASSISTED VAGINAL HYSTERECTOMY  04/23/2002   '@[facility]'$   by Dr Willis Modena;  With Left Salpingoophorectomy/  Anterior Repair/  Tension Free Tape Sling placement   LAPAROSCOPIC GASTRIC SLEEVE RESECTION N/A 09/07/2021   Procedure: LAPAROSCOPIC GASTRIC SLEEVE RESECTION;  Surgeon: [name], MD;  [facility];  Service: General;  Laterality: N/A;   LASER ABLATION OF THE CERVIX  x2  1980's   POSTERIOR LAMINECTOMY / DECOMPRESSION LUMBAR SPINE  02/03/2020   '@[facility]'$   Dr Ellene Route;   L4--5 and fusion   POSTERIOR LUMBAR FUSION  01/21/2019   '@[facility]'$  by Dr Ellene Route;   L5--S1   TUBAL LIGATION  1980's   UPPER GI ENDOSCOPY N/A 09/07/2021   Procedure: UPPER GI ENDOSCOPY;  Surgeon: [name], MD;  [facility];  Service: General;  Laterality: N/A;   VENTRAL HERNIA REPAIR N/A 09/05/2016   Procedure: LAPAROSCOPIC VENTRAL HERNIA;  Surgeon: [name], MD;  Location: [place];  Service: General;  Laterality: N/A;    reports that she has never smoked. She has never used smokeless tobacco. She reports current alcohol use. She reports that she does not use drugs. family history includes Alcohol abuse in her mother; Arthritis in her father; COPD in her father; Cancer in her maternal grandmother and paternal grandmother; Cirrhosis in her sister; Colon cancer in her maternal grandmother and mother; Heart disease in her father; Hyperlipidemia in her father; Hypertension in her father; Kidney cancer in her sister; Kidney disease in her paternal uncle;  Lung cancer in her mother; Rectal cancer in her maternal grandmother. Allergies  Allergen Reactions   Keflex [Cephalexin] Swelling and Other (See Comments)    Tongue swelling    Review of Systems  Constitutional:  Negative for chills, fever and malaise/fatigue.  Eyes:  Negative for blurred vision.  Respiratory:  Negative for shortness of breath.   Cardiovascular:  Negative for chest pain.  Gastrointestinal:  Negative for abdominal pain.  Genitourinary:  Negative for dysuria.  Neurological:  Negative for dizziness, weakness and headaches.      Objective:     BP 128/80 (BP Location: Left Arm, Patient Position: Sitting, Cuff Size: Normal)   Pulse 90   Temp 98 F (36.7 C) (Oral)   Ht '5\' 4"'$  (1.626 m)   Wt 204 lb 1.6 oz (92.6 kg)   SpO2 98%   BMI 35.03 kg/m  BP Readings from Last 3 Encounters:  03/07/22 128/80  02/09/22 120/70  01/04/22 110/64   Wt Readings from Last 3 Encounters:  03/07/22 204 lb 1.6 oz (92.6 kg)  02/09/22 211 lb 8 oz (95.9 kg)  01/04/22 218 lb 8 oz (99.1 kg)      Physical Exam Constitutional:      Appearance: She is well-developed.  Eyes:     Pupils: Pupils are equal, round, and reactive to light.  Neck:     Thyroid: No thyromegaly.     Vascular: No JVD.  Cardiovascular:     Rate and Rhythm: Normal rate and regular rhythm.     Heart sounds:     No gallop.  Pulmonary:     Effort: Pulmonary effort is normal. No respiratory distress.     Breath sounds: Normal breath sounds. No wheezing or rales.  Musculoskeletal:     Cervical back: Neck supple.     Right lower leg: No edema.     Left lower leg: No edema.  Neurological:     Mental Status: She is alert.      Results for orders placed or performed in visit on 03/07/22  POCT glycosylated hemoglobin (Hb A1C)  Result Value Ref Range   Hemoglobin A1C 6.0 (A) 4.0 - 5.6 %   HbA1c POC (<> result, manual entry)     HbA1c, POC (prediabetic range)     HbA1c, POC (controlled diabetic range)       Last CBC Lab Results  Component Value Date   WBC 10.0 10/25/2021   HGB 13.7 10/25/2021   HCT 42.9 10/25/2021   MCV 85.3 10/25/2021   MCH 27.2 10/25/2021   RDW 14.6 10/25/2021   PLT 348 10/93/2355   Last metabolic panel Lab Results  Component Value Date   GLUCOSE 119 (H) 10/25/2021   NA 141 10/25/2021   K 3.4 (L) 10/25/2021   CL 106 10/25/2021   CO2 27 10/25/2021   BUN 5 (L) 10/25/2021   CREATININE 0.80 10/25/2021   GFRNONAA >60 10/25/2021   CALCIUM 9.3 10/25/2021   PROT 7.0 10/25/2021   ALBUMIN 3.2 (L) 10/25/2021   BILITOT 0.8 10/25/2021   ALKPHOS 126 10/25/2021   AST 56 (H) 10/25/2021   ALT 46 (H) 10/25/2021   ANIONGAP 8 10/25/2021   Last lipids Lab Results  Component Value Date   CHOL 156 03/23/2020   HDL 45 (L) 03/23/2020   LDLCALC 86 03/23/2020   TRIG 155 (H) 03/23/2020   CHOLHDL 3.5 03/23/2020   Last hemoglobin A1c Lab Results  Component Value Date   HGBA1C 6.0 (A) 03/07/2022   Last thyroid functions Lab Results  Component Value Date   TSH 6.88 (H) 08/17/2021      The 10-year ASCVD risk score (Arnett DK, et al., 2019) is: 7.2%    Assessment & Plan:   #1 type 2 diabetes.  Improving with A1c now 6.0%.  She is off all diabetic medications.  Continue to monitor periodically  #2 pulmonary nodule noted incidentally on CT angiogram chest back in May.  We have previously ordered CT chest to follow-up this and she still has not heard back anything.  We will reorder once again and she is to notify us if she has not heard anything back within 3 days  #3 elevated liver transaminases probably related to fatty liver.  Recheck comprehensive metabolic panel.  Also check hepatitis studies.  Consider repeat abdominal ultrasound especially if liver enzymes not further improving  #4 hypertension improved with her weight loss.  Continue low-dose losartan but  watch for dizziness or low blood pressures with additional weight loss.   No follow-ups on file.     Carolann Littler, MD

## 2022-03-07 NOTE — Patient Instructions (Signed)
Let me know if you have not heard about CT chest by this Thursday.

## 2022-03-08 LAB — COMPREHENSIVE METABOLIC PANEL
ALT: 15 U/L (ref 0–35)
AST: 16 U/L (ref 0–37)
Albumin: 3.7 g/dL (ref 3.5–5.2)
Alkaline Phosphatase: 136 U/L — ABNORMAL HIGH (ref 39–117)
BUN: 24 mg/dL — ABNORMAL HIGH (ref 6–23)
CO2: 24 mEq/L (ref 19–32)
Calcium: 9.6 mg/dL (ref 8.4–10.5)
Chloride: 103 mEq/L (ref 96–112)
Creatinine, Ser: 0.84 mg/dL (ref 0.40–1.20)
GFR: 76.07 mL/min (ref >60.00)
Glucose, Bld: 157 mg/dL — ABNORMAL HIGH (ref 70–99)
Potassium: 4 mEq/L (ref 3.5–5.1)
Sodium: 137 mEq/L (ref 135–145)
Total Bilirubin: 0.4 mg/dL (ref 0.2–1.2)
Total Protein: 7.7 g/dL (ref 6.0–8.3)

## 2022-03-08 LAB — HEPATITIS B SURFACE ANTIGEN: Hepatitis B Surface Ag: NONREACTIVE

## 2022-03-08 LAB — HEPATITIS B SURFACE ANTIBODY,QUALITATIVE: Hep B S Ab: REACTIVE — AB

## 2022-03-08 LAB — HEPATITIS B CORE ANTIBODY, TOTAL: Hep B Core Total Ab: REACTIVE — AB

## 2022-03-08 LAB — HEPATITIS C ANTIBODY: Hepatitis C Ab: NONREACTIVE

## 2022-03-10 DIAGNOSIS — M25612 Stiffness of left shoulder, not elsewhere classified: Secondary | ICD-10-CM | POA: Diagnosis not present

## 2022-03-10 DIAGNOSIS — M25512 Pain in left shoulder: Secondary | ICD-10-CM | POA: Diagnosis not present

## 2022-04-04 ENCOUNTER — Encounter: Payer: Self-pay | Admitting: Family Medicine

## 2022-04-07 ENCOUNTER — Ambulatory Visit: Payer: Medicare Other | Admitting: Plastic Surgery

## 2022-04-07 ENCOUNTER — Encounter: Payer: Self-pay | Admitting: Plastic Surgery

## 2022-04-07 VITALS — BP 105/71 | HR 83 | Temp 98.1°F | Resp 18 | Ht 64.0 in | Wt 204.0 lb

## 2022-04-07 DIAGNOSIS — M793 Panniculitis, unspecified: Secondary | ICD-10-CM | POA: Diagnosis not present

## 2022-04-07 DIAGNOSIS — Z9884 Bariatric surgery status: Secondary | ICD-10-CM

## 2022-04-07 DIAGNOSIS — R21 Rash and other nonspecific skin eruption: Secondary | ICD-10-CM | POA: Diagnosis not present

## 2022-04-07 NOTE — Progress Notes (Signed)
Referring Provider [name], MD [place],  [place] [address]   CC:  Chief Complaint  Patient presents with   Consult      [name] Sherman is an 61 y.o. female.  HPI: [name] Sherman is a 61 year old female who underwent gastric sleeve gastric surgery in March of this year for treatment of severe obesity.  She has done extremely well since his surgery lowering both her blood sugar and blood pressure medications.  He is lost 80 pounds per her report.  She is requesting removal of her pannus now due to ongoing infections requiring nystatin ointments and persistent pain and odor from the sweat which accumulates on the posterior aspect of the pannus.  Of note she still has 20 pounds which she would like to lose to achieve her goal weight.  She also states that she has become more physically active since her surgery specifically she no longer uses a motorized cart for any reason and walks everywhere that she needs to go.  Allergies  Allergen Reactions   Keflex [Cephalexin] Swelling and Other (See Comments)    Tongue swelling    Outpatient Encounter Medications as of 04/07/2022  Medication Sig   amphetamine-dextroamphetamine (ADDERALL) 30 MG tablet Take one tablet twice daily.   atorvastatin (LIPITOR) 10 MG tablet Take 1 tablet (10 mg total) by mouth daily.   CALCIUM CITRATE PO Take 3 tablets by mouth 2 (two) times daily.   cetirizine (ZYRTEC) 10 MG tablet Take 10 mg by mouth in the morning.   citalopram (CELEXA) 20 MG tablet Take 1 tablet (20 mg total) by mouth daily.   levothyroxine (SYNTHROID) 50 MCG tablet Take 1 tablet (50 mcg total) by mouth daily.   losartan (COZAAR) 50 MG tablet Take 1 tablet (50 mg total) by mouth daily.   NON FORMULARY Take 1 tablet by mouth See admin instructions. unnamed Bariatric vitamin- Take 1 tablet by mouth once a day   nystatin cream (MYCOSTATIN) Apply 1 Application topically 2 (two) times daily.   SUMAtriptan (IMITREX) 100 MG  tablet May repeat in 2 hours if headache persists or recurs.  Do not take more than 2 in 24 hours (Patient taking differently: Take 100 mg by mouth every 2 (two) hours as needed for migraine (and may repeat in 2 hours if headache persists or recurs.  Do not take more than 2 in 24 hours.).)   Accu-Chek Softclix Lancets lancets Use as instructed (Patient not taking: Reported on 04/07/2022)   amphetamine-dextroamphetamine (ADDERALL) 30 MG tablet Take 1 tablet by mouth daily.   amphetamine-dextroamphetamine (ADDERALL) 30 MG tablet Take 1 tablet by mouth daily.   blood glucose meter kit and supplies Dispense based on patient and insurance preference. Use up to four times daily as directed. (FOR ICD-10 E10.9, E11.9). (Patient not taking: Reported on 04/07/2022)   furosemide (LASIX) 20 MG tablet TAKE 1 TABLET BY MOUTH ONCE A DAY AS NEEDED FOR SWELLING (Patient not taking: Reported on 04/07/2022)   glucose blood (ACCU-CHEK GUIDE) test strip Use to test blood sugars 3 times a day. (Patient not taking: Reported on 04/07/2022)   ondansetron (ZOFRAN-ODT) 4 MG disintegrating tablet Take 1 tablet (4 mg total) by mouth every 6 (six) hours as needed for nausea or vomiting. (Patient not taking: Reported on 04/07/2022)   oxyCODONE-acetaminophen (PERCOCET) 10-325 MG tablet Take 1 tablet by mouth in the morning, at noon, and at bedtime. (Patient not taking: Reported on 04/07/2022)   [DISCONTINUED] pantoprazole (PROTONIX) 40  MG tablet TAKE 1 TABLET(40 MG) BY MOUTH TWICE DAILY   No facility-administered encounter medications on file as of 04/07/2022.     Past Medical History:  Diagnosis Date   ADD (attention deficit disorder)    Anemia    Anxiety    Arthritis    shoulder, right (arthritis, tendonitis, bursitis)   Chronic low back pain    Complication of anesthesia    Dyspnea    Fatty liver    GERD (gastroesophageal reflux disease)    History of cervical cancer    s/p  laser ablation of cervix 1980's   History  of COVID-19 07/2019   History of kidney stones    Hyperlipidemia    Hypertension    Hypothyroidism    IBS (irritable bowel syndrome)    MDD (major depressive disorder)    Migraine    OSA (obstructive sleep apnea)    pt used cpap up until 2013 states lost wt and did not need anymore   PONV (postoperative nausea and vomiting)    Restless legs    Sleep apnea    wears CPAP   Type 2 diabetes mellitus ([place])    Urgency of urination    Vertigo    Vitamin D deficiency    Vulvar cyst     Past Surgical History:  Procedure Laterality Date   BUNIONECTOMY Right 2004   COLONOSCOPY     CYSTOSCOPY W/ RETROGRADES Bilateral 05/26/2015   Procedure: CYSTOSCOPY WITH RETROGRADE PYELOGRAM;  Surgeon: Festus Aloe, MD;  Location: [facility];  Service: Urology;  Laterality: Bilateral;   CYSTOSCOPY W/ URETERAL STENT REMOVAL Left 05/26/2015   Procedure: CYSTOSCOPY WITH STENT REMOVAL;  Surgeon: Festus Aloe, MD;  [facility];  Service: Urology;  Laterality: Left;   CYSTOSCOPY WITH RETROGRADE PYELOGRAM, URETEROSCOPY AND STENT PLACEMENT Left 05/01/2015   Procedure: CYSTOSCOPY WITH RETROGRADE PYELOGRAM AND STENT PLACEMENT;  Surgeon: Festus Aloe, MD;  Location[facility];  Service: Urology;  Laterality: Left;   CYSTOSCOPY WITH STENT PLACEMENT Bilateral 05/26/2015   Procedure: CYSTOSCOPY WITH STENT PLACEMENT;  Surgeon: Festus Aloe, MD;  Location: [facility];  Service: Urology;  Laterality: Bilateral;   CYSTOSCOPY WITH URETEROSCOPY Bilateral 05/26/2015   Procedure: CYSTOSCOPY WITH URETEROSCOPY;  Surgeon: Festus Aloe, MD;  Location: [facility];  Service: Urology;  Laterality: Bilateral;   CYSTOSCOPY/URETEROSCOPY/HOLMIUM LASER/STENT PLACEMENT Right 06/09/2015   Procedure: CYSTOSCOPY/URETEROSCOPY/STENT PLACEMENT REMOVAL LEFT URETERAL STENT;  Surgeon: Festus Aloe, MD;  Location[facility];  Service: Urology;  Laterality:  Right;   DIAGNOSTIC LAPAROSCOPY     HOLMIUM LASER APPLICATION Left 94/17/4081   Procedure: HOLMIUM LASER APPLICATION;  Surgeon: Festus Aloe, MD;  [facility];  Service: Urology;  Laterality: Left;   HOLMIUM LASER APPLICATION Right 44/81/8563   Procedure: HOLMIUM LASER APPLICATION;  Surgeon: Festus Aloe, MD;  Location[facility];  Service: Urology;  Laterality: Right;   INSERTION OF MESH N/A 09/05/2016   Procedure: INSERTION OF MESH;  Surgeon: [name], MD;  Location: [place];  Service: General;  Laterality: N/A;   LAPAROSCOPIC ASSISTED VAGINAL HYSTERECTOMY  04/23/2002   _0   by Dr Willis Modena;  With Left Salpingoophorectomy/  Anterior Repair/  Tension Free Tape Sling placement   LAPAROSCOPIC GASTRIC SLEEVE RESECTION N/A 09/07/2021   Procedure: LAPAROSCOPIC GASTRIC SLEEVE RESECTION;  Surgeon: [name], MD;  Location[facility];  Service: General;  Laterality: N/A;   LASER ABLATION OF THE CERVIX  x2  1980's  POSTERIOR LAMINECTOMY / DECOMPRESSION LUMBAR SPINE  02/03/2020   _0   Dr Ellene Route;   L4--5 and fusion   POSTERIOR LUMBAR FUSION  01/21/2019   _1  by Dr Ellene Route;   L5--S1   TUBAL LIGATION  1980's   UPPER GI ENDOSCOPY N/A 09/07/2021   Procedure: UPPER GI ENDOSCOPY;  Surgeon: [name], MD;  [facility];  Service: General;  Laterality: N/A;   VENTRAL HERNIA REPAIR N/A 09/05/2016   Procedure: LAPAROSCOPIC VENTRAL HERNIA;  Surgeon: [name], MD;  Location: [place];  Service: General;  Laterality: N/A;    Family History  Problem Relation Age of Onset   Alcohol abuse Mother    Lung cancer Mother    Colon cancer Mother    Arthritis Father    Hyperlipidemia Father    Heart disease Father    Hypertension Father    COPD Father    Kidney disease Paternal Uncle    Cancer Maternal Grandmother        colon   Colon cancer Maternal Grandmother    Rectal cancer Maternal Grandmother    Cancer Paternal Grandmother        breast   Kidney  cancer Sister        lung cancer   Cirrhosis Sister    Esophageal cancer Neg Hx    Stomach cancer Neg Hx     Social History   Social History Narrative   Not on file     Review of Systems General: Denies fevers, chills, weight loss CV: Denies chest pain, shortness of breath, palpitations Skin: Excess skin on the anterior abdominal wall.  Rashes requiring constant nystatin medication on the posterior aspect of the pannus. Musculoskeletal: Patient has a history of back pain which is improving with weight loss.  Physical Exam    04/07/2022    8:21 AM 03/07/2022    3:48 PM 02/09/2022    4:28 PM  Vitals with BMI  Height _2  _3  _4   Weight 204 lbs 204 lbs 2 oz 211 lbs 8 oz  BMI 35 16.10 96.04  Systolic 540 981 191  Diastolic 71 80 70  Pulse 83 90     General:  No acute distress,  Alert and oriented, Non-Toxic, Normal speech and affect Abdomen: Patient has a large pannus which hangs below her symphysis pubis.  Also has a very protuberant upper abdomen however most of this feels like it is intra-abdominal fat for which I do not have a good operation.  She does have very mild rashes along the intertriginous regions in the posterior aspect of the pannus.  Assessment/Plan Panniculitis: Patient will be a good candidate for panniculectomy.  I do not think that she will be a good candidate for contouring of the upper abdomen since most of her weight is intra-abdominal.  I have recommended to her that she wait to schedule surgery until she has reached her ideal weight because 1 will decrease the complications during surgery and 2 she will get a better aesthetic result.  I have also recommended to her that she add minimum of 30 minutes of walking per day to her fitness regimen with a goal of 1 hour/day if she can tolerate that.  Additionally if she can add light weight lifting, I believe that she will recover from surgery much quicker.  She is happy with all of these recommendations and will  return to see me at the beginning of December. We will schedule her surgery for the  beginning of the year.  Camillia Herter 04/07/2022, 8:40 AM

## 2022-05-03 ENCOUNTER — Telehealth: Payer: Self-pay | Admitting: Family Medicine

## 2022-05-03 MED ORDER — AMPHETAMINE-DEXTROAMPHETAMINE 30 MG PO TABS: 30.0000 mg | ORAL_TABLET | Freq: Every day | ORAL | 0 refills | Status: DC

## 2022-05-03 MED ORDER — AMPHETAMINE-DEXTROAMPHETAMINE 30 MG PO TABS: ORAL_TABLET | ORAL | 0 refills | Status: DC

## 2022-05-03 NOTE — Telephone Encounter (Signed)
Patient is requesting a refill on her Adderall.

## 2022-05-18 DIAGNOSIS — M961 Postlaminectomy syndrome, not elsewhere classified: Secondary | ICD-10-CM | POA: Diagnosis not present

## 2022-05-30 ENCOUNTER — Ambulatory Visit (INDEPENDENT_AMBULATORY_CARE_PROVIDER_SITE_OTHER): Payer: Medicare Other

## 2022-05-30 ENCOUNTER — Ambulatory Visit: Payer: Medicare Other | Admitting: Podiatry

## 2022-05-30 DIAGNOSIS — M2012 Hallux valgus (acquired), left foot: Secondary | ICD-10-CM

## 2022-05-30 DIAGNOSIS — L600 Ingrowing nail: Secondary | ICD-10-CM

## 2022-05-30 MED ORDER — GENTAMICIN SULFATE 0.1 % EX CREA: 1.0000 | TOPICAL_CREAM | Freq: Two times a day (BID) | CUTANEOUS | 1 refills | Status: DC

## 2022-05-30 NOTE — Patient Instructions (Signed)

## 2022-05-30 NOTE — Progress Notes (Signed)
Chief Complaint  Patient presents with   Ingrown Toenail     Bilateral ingrowns- great toe patient states they are painful. No discharge or swelling    Bunions      On going for years Pain is not constant it various .Marland Kitchen No swelling some redness. Patient states pain is affecting her gait     Subjective: Patient presents today as a new patient for evaluation of pain to the medial lateral border of the bilateral great toes. Patient is concerned for possible ingrown nail.  It is very sensitive to touch.  Patient states that she has been dealing with ingrown toenails for several years.  She routinely gets pedicures performed which only helped temporarily.   Patient also states that she developed pain and tenderness associated to the left great toe joint.  She is concerned for possible bunion.  Patient presents today for further treatment and evaluation.  Past Medical History:  Diagnosis Date   ADD (attention deficit disorder)    Anemia    Anxiety    Arthritis    shoulder, right (arthritis, tendonitis, bursitis)   Chronic low back pain    Complication of anesthesia    Dyspnea    Fatty liver    GERD (gastroesophageal reflux disease)    History of cervical cancer    s/p  laser ablation of cervix 1980's   History of COVID-19 07/2019   History of kidney stones    Hyperlipidemia    Hypertension    Hypothyroidism    IBS (irritable bowel syndrome)    MDD (major depressive disorder)    Migraine    OSA (obstructive sleep apnea)    pt used cpap up until 2013 states lost wt and did not need anymore   PONV (postoperative nausea and vomiting)    Restless legs    Sleep apnea    wears CPAP   Type 2 diabetes mellitus ([facility])    Urgency of urination    Vertigo    Vitamin D deficiency    Vulvar cyst     Objective:  General: Well developed, nourished, in no acute distress, alert and oriented x3   Dermatology: Skin is warm, dry and supple bilateral.  Medial lateral border bilateral great  toes is tender with evidence of an ingrowing nail. Pain on palpation noted to the border of the nail fold. The remaining nails appear unremarkable at this time. There are no open sores, lesions.  Vascular: DP and PT pulses palpable.  No clinical evidence of vascular compromise  Neruologic: Grossly intact via light touch bilateral.  Musculoskeletal: Clinical evidence of hallux valgus bunion deformity noted with a prominent medial eminence of the first metatarsal head left foot.  Associated tenderness to palpation  Radiographic exam LT foot 05/30/2022: Increased intermetatarsal angle with medial deviation of the first metatarsal left foot and a prominent medial eminence of the first metatarsal head.  Moderate erosive changes noted throughout the joint.  Assesement: #1 Paronychia with ingrowing nail medial and lateral border bilateral great toes #2 hallux valgus left  Plan of Care:  1. Patient evaluated.  2. Discussed treatment alternatives and plan of care. Explained nail avulsion procedure and post procedure course to patient. 3. Patient opted for permanent partial nail avulsion of the ingrown portion of the nail.  4. Prior to procedure, local anesthesia infiltration utilized using 3 ml of a 50:50 mixture of 2% plain lidocaine and 0.5% plain marcaine in a normal hallux block fashion and a betadine prep performed.  5. Partial permanent nail avulsion with chemical matrixectomy performed using [id number] applications of phenol followed by alcohol flush.  6. Light dressing applied.  Post care instructions provided 7.  Prescription for gentamicin 2% cream  8.  Today also we discussed different treatment options both conservative and surgical for the chronic pain to the left great toe joint bunion deformity.  For now the patient is going to continue conservative treatment including wide shoes that do not irritate the toebox area.  She will go home and think about the surgery  9.  Return to clinic 2  weeks.  *best friends with Victorino Sparrow, patient  Edrick Kins, DPM [facility]  Dr. Edrick Kins, DPM    [address], [place] [address]                Office [phone]  Fax [phone]

## 2022-06-05 ENCOUNTER — Encounter: Payer: Self-pay | Admitting: Family Medicine

## 2022-06-06 ENCOUNTER — Telehealth: Payer: Self-pay | Admitting: Family Medicine

## 2022-06-06 DIAGNOSIS — Z20822 Contact with and (suspected) exposure to covid-19: Secondary | ICD-10-CM | POA: Diagnosis not present

## 2022-06-06 DIAGNOSIS — H9201 Otalgia, right ear: Secondary | ICD-10-CM | POA: Diagnosis not present

## 2022-06-06 DIAGNOSIS — J029 Acute pharyngitis, unspecified: Secondary | ICD-10-CM | POA: Diagnosis not present

## 2022-06-06 MED ORDER — AMPHETAMINE-DEXTROAMPHETAMINE 30 MG PO TABS: 30.0000 mg | ORAL_TABLET | Freq: Two times a day (BID) | ORAL | 0 refills | Status: DC

## 2022-06-06 MED ORDER — AMPHETAMINE-DEXTROAMPHETAMINE 30 MG PO TABS: ORAL_TABLET | ORAL | 0 refills | Status: DC

## 2022-06-06 NOTE — Telephone Encounter (Signed)
Pt is calling and [facility] does not have adderall in stock please sent to amphetamine-dextroamphetamine (ADDERALL) 30 MG tablet  [id number] [place] [facility] [address] Phone: [phone] Fax: [phone]

## 2022-06-07 DIAGNOSIS — M5416 Radiculopathy, lumbar region: Secondary | ICD-10-CM | POA: Diagnosis not present

## 2022-06-09 ENCOUNTER — Telehealth: Payer: Self-pay

## 2022-06-09 ENCOUNTER — Ambulatory Visit: Payer: Medicare Other | Admitting: Plastic Surgery

## 2022-06-09 NOTE — Telephone Encounter (Signed)
Caller states that her throat (7.5/10)has gotten really sore on one side (Right). Started this morning when you got to work. S/S: cough (dry), HA: forehead (7/10) TX: none Denies Fever Drinking well- water.  06/06/2022 1:54:12 PM See PCP within 24 Hours Stripling, RN, [facility] [employer]  06/09/22 1158 - Pt states she went to UC & strep test was negative; covid negative. Was given abx. States she feeling better. Is awaiting to hear back from UC regarding the results of the strep swab that was sent out. She was advised by UC to continue abx until she heard from them. Pt advised by this RN if she was not getting better & had not heard from UC to schedule appt with in person provider. Pt verb understanding.

## 2022-06-13 ENCOUNTER — Ambulatory Visit: Payer: Medicare Other

## 2022-06-14 ENCOUNTER — Ambulatory Visit: Payer: Medicare Other

## 2022-06-14 ENCOUNTER — Telehealth: Payer: Self-pay

## 2022-06-14 NOTE — Telephone Encounter (Signed)
Unsuccessful attempt to reach patient on preferred number listed in notes for scheduled AWV. Left message on voicemail okay to reschedule. 

## 2022-06-21 ENCOUNTER — Ambulatory Visit: Payer: Medicare Other | Admitting: Podiatry

## 2022-06-24 DIAGNOSIS — U071 COVID-19: Secondary | ICD-10-CM

## 2022-06-30 ENCOUNTER — Ambulatory Visit (INDEPENDENT_AMBULATORY_CARE_PROVIDER_SITE_OTHER): Payer: Medicare Other

## 2022-06-30 VITALS — Ht 64.0 in | Wt 196.0 lb

## 2022-06-30 DIAGNOSIS — Z0000 Encounter for general adult medical examination without abnormal findings: Secondary | ICD-10-CM

## 2022-06-30 NOTE — Progress Notes (Signed)
Subjective:   [name] Sherman is a 62 y.o. female who presents for Medicare Annual (Subsequent) preventive examination.  Review of Systems    Virtual Visit via Telephone Note  I connected with  [name] Sherman on 06/30/22 at  3:30 PM EST by telephone and verified that I am speaking with the correct person using two identifiers.  Location: Patient: Home Provider: Office Persons participating in the virtual visit: patient/Nurse Health Advisor   I discussed the limitations, risks, security and privacy concerns of performing an evaluation and management service by telephone and the availability of in person appointments. The patient expressed understanding and agreed to proceed.  Interactive audio and video telecommunications were attempted between this nurse and patient, however failed, due to patient having technical difficulties OR patient did not have access to video capability.  We continued and completed visit with audio only.  Some vital signs may be absent or patient reported.   Criselda Peaches, LPN  Cardiac Risk Factors include: advanced age (>78mn, >>5women);hypertension     Objective:    Today's Vitals   06/30/22 1055  Weight: 196 lb (88.9 kg)  Height: _0  (1.626 m)   Body mass index is 33.64 kg/m.     06/30/2022   11:02 AM 10/25/2021   11:00 PM 09/07/2021    9:56 AM 08/25/2021    9:58 AM 06/08/2021   10:54 AM 01/30/2020   11:21 AM 01/17/2019   11:31 AM  Advanced Directives  Does Patient Have a Medical Advance Directive? _1  No No  Would patient like information on creating a medical advance directive? No - Patient declined No - Patient declined No - Patient declined   No - Patient declined     Current Medications (verified) Outpatient Encounter Medications as of 06/30/2022  Medication Sig   Accu-Chek Softclix Lancets lancets Use as instructed (Patient not taking: Reported on 04/07/2022)   amphetamine-dextroamphetamine (ADDERALL) 30 MG tablet Take 1  tablet by mouth 2 (two) times daily.   amphetamine-dextroamphetamine (ADDERALL) 30 MG tablet Take 1 tablet by mouth 2 (two) times daily.   amphetamine-dextroamphetamine (ADDERALL) 30 MG tablet Take one tablet twice daily.   atorvastatin (LIPITOR) 10 MG tablet Take 1 tablet (10 mg total) by mouth daily.   blood glucose meter kit and supplies Dispense based on patient and insurance preference. Use up to four times daily as directed. (FOR ICD-10 E10.9, E11.9). (Patient not taking: Reported on 04/07/2022)   CALCIUM CITRATE PO Take 3 tablets by mouth 2 (two) times daily.   cetirizine (ZYRTEC) 10 MG tablet Take 10 mg by mouth in the morning.   citalopram (CELEXA) 20 MG tablet Take 1 tablet (20 mg total) by mouth daily.   furosemide (LASIX) 20 MG tablet TAKE 1 TABLET BY MOUTH ONCE A DAY AS NEEDED FOR SWELLING (Patient not taking: Reported on 04/07/2022)   gentamicin cream (GARAMYCIN) 0.1 % Apply 1 Application topically 2 (two) times daily.   glucose blood (ACCU-CHEK GUIDE) test strip Use to test blood sugars 3 times a day. (Patient not taking: Reported on 04/07/2022)   levothyroxine (SYNTHROID) 50 MCG tablet Take 1 tablet (50 mcg total) by mouth daily.   losartan (COZAAR) 50 MG tablet Take 1 tablet (50 mg total) by mouth daily.   NON FORMULARY Take 1 tablet by mouth See admin instructions. unnamed Bariatric vitamin- Take 1 tablet by mouth once a day   nystatin cream (MYCOSTATIN) Apply 1 Application topically 2 (two) times daily.  ondansetron (ZOFRAN-ODT) 4 MG disintegrating tablet Take 1 tablet (4 mg total) by mouth every 6 (six) hours as needed for nausea or vomiting. (Patient not taking: Reported on 04/07/2022)   oxyCODONE-acetaminophen (PERCOCET) 10-325 MG tablet Take 1 tablet by mouth in the morning, at noon, and at bedtime. (Patient not taking: Reported on 04/07/2022)   SUMAtriptan (IMITREX) 100 MG tablet May repeat in 2 hours if headache persists or recurs.  Do not take more than 2 in 24 hours  (Patient taking differently: Take 100 mg by mouth every 2 (two) hours as needed for migraine (and may repeat in 2 hours if headache persists or recurs.  Do not take more than 2 in 24 hours.).)   [DISCONTINUED] pantoprazole (PROTONIX) 40 MG tablet TAKE 1 TABLET(40 MG) BY MOUTH TWICE DAILY   No facility-administered encounter medications on file as of 06/30/2022.    Allergies (verified) Keflex [cephalexin]   History: Past Medical History:  Diagnosis Date   ADD (attention deficit disorder)    Anemia    Anxiety    Arthritis    shoulder, right (arthritis, tendonitis, bursitis)   Chronic low back pain    Complication of anesthesia    Dyspnea    Fatty liver    GERD (gastroesophageal reflux disease)    History of cervical cancer    s/p  laser ablation of cervix 1980's   History of COVID-19 07/2019   History of kidney stones    Hyperlipidemia    Hypertension    Hypothyroidism    IBS (irritable bowel syndrome)    MDD (major depressive disorder)    Migraine    OSA (obstructive sleep apnea)    pt used cpap up until 2013 states lost wt and did not need anymore   PONV (postoperative nausea and vomiting)    Restless legs    Sleep apnea    wears CPAP   Type 2 diabetes mellitus ([place])    Urgency of urination    Vertigo    Vitamin D deficiency    Vulvar cyst    Past Surgical History:  Procedure Laterality Date   BUNIONECTOMY Right 2004   COLONOSCOPY     CYSTOSCOPY W/ RETROGRADES Bilateral 05/26/2015   Procedure: CYSTOSCOPY WITH RETROGRADE PYELOGRAM;  Surgeon: Festus Aloe, MD;  Location[facility];  Service: Urology;  Laterality: Bilateral;   CYSTOSCOPY W/ URETERAL STENT REMOVAL Left 05/26/2015   Procedure: CYSTOSCOPY WITH STENT REMOVAL;  Surgeon: Festus Aloe, MD;  Location: [facility];  Service: Urology;  Laterality: Left;   CYSTOSCOPY WITH RETROGRADE PYELOGRAM, URETEROSCOPY AND STENT PLACEMENT Left 05/01/2015   Procedure: CYSTOSCOPY WITH  RETROGRADE PYELOGRAM AND STENT PLACEMENT;  Surgeon: Festus Aloe, MD;  Location: [facility];  Service: Urology;  Laterality: Left;   CYSTOSCOPY WITH STENT PLACEMENT Bilateral 05/26/2015   Procedure: CYSTOSCOPY WITH STENT PLACEMENT;  Surgeon: Festus Aloe, MD;  Location[facility];  Service: Urology;  Laterality: Bilateral;   CYSTOSCOPY WITH URETEROSCOPY Bilateral 05/26/2015   Procedure: CYSTOSCOPY WITH URETEROSCOPY;  Surgeon: Festus Aloe, MD;  Location[facility];  Service: Urology;  Laterality: Bilateral;   CYSTOSCOPY/URETEROSCOPY/HOLMIUM LASER/STENT PLACEMENT Right 06/09/2015   Procedure: CYSTOSCOPY/URETEROSCOPY/STENT PLACEMENT REMOVAL LEFT URETERAL STENT;  Surgeon: Festus Aloe, MD;  Location: [facility];  Service: Urology;  Laterality: Right;   DIAGNOSTIC LAPAROSCOPY     HOLMIUM LASER APPLICATION Left 97/98/9211   Procedure: HOLMIUM LASER APPLICATION;  Surgeon: Festus Aloe, MD;  Location: [facility];  Service: Urology;  Laterality: Left;   HOLMIUM LASER APPLICATION Right 44/08/4740   Procedure: HOLMIUM LASER APPLICATION;  Surgeon: Festus Aloe, MD;  Location: [facility];  Service: Urology;  Laterality: Right;   INSERTION OF MESH N/A 09/05/2016   Procedure: INSERTION OF MESH;  Surgeon: [name], MD;  Location: [facility];  Service: General;  Laterality: N/A;   LAPAROSCOPIC ASSISTED VAGINAL HYSTERECTOMY  04/23/2002   _0   by Dr Willis Modena;  With Left Salpingoophorectomy/  Anterior Repair/  Tension Free Tape Sling placement   LAPAROSCOPIC GASTRIC SLEEVE RESECTION N/A 09/07/2021   Procedure: LAPAROSCOPIC GASTRIC SLEEVE RESECTION;  Surgeon: [name], MD;  Location: [facility];  Service: General;  Laterality: N/A;   LASER ABLATION OF THE CERVIX  x2  1980's   POSTERIOR LAMINECTOMY / DECOMPRESSION LUMBAR SPINE  02/03/2020   _1   Dr Ellene Route;   L4--5 and fusion   POSTERIOR LUMBAR FUSION  01/21/2019   _2  by Dr Ellene Route;   L5--S1    TUBAL LIGATION  1980's   UPPER GI ENDOSCOPY N/A 09/07/2021   Procedure: UPPER GI ENDOSCOPY;  Surgeon: [name], MD;  Location: [facility];  Service: General;  Laterality: N/A;   VENTRAL HERNIA REPAIR N/A 09/05/2016   Procedure: LAPAROSCOPIC VENTRAL HERNIA;  Surgeon: [name], MD;  Location: [facility];  Service: General;  Laterality: N/A;   Family History  Problem Relation Age of Onset   Alcohol abuse Mother    Lung cancer Mother    Colon cancer Mother    Arthritis Father    Hyperlipidemia Father    Heart disease Father    Hypertension Father    COPD Father    Kidney disease Paternal Uncle    Cancer Maternal Grandmother        colon   Colon cancer Maternal Grandmother    Rectal cancer Maternal Grandmother    Cancer Paternal Grandmother        breast   Kidney cancer Sister        lung cancer   Cirrhosis Sister    Esophageal cancer Neg Hx    Stomach cancer Neg Hx    Social History   Socioeconomic History   Marital status: Married    Spouse name: Not on file   Number of children: Not on file   Years of education: Not on file   Highest education level: Not on file  Occupational History   Not on file  Tobacco Use   Smoking status: Never   Smokeless tobacco: Never  Vaping Use   Vaping Use: Never used  Substance and Sexual Activity   Alcohol use: Yes    Comment: socially   Drug use: Never   Sexual activity: Yes  Other Topics Concern   Not on file  Social History Narrative   Not on file   Social Determinants of Health   Financial Resource Strain: Low Risk  (06/08/2021)   Overall Financial Resource Strain (CARDIA)    Difficulty of Paying Living Expenses: Not hard at all  Food Insecurity: No Food Insecurity (06/30/2022)   Hunger Vital Sign    Worried About Running Out of Food in the Last Year: Never true    Ran Out of Food in the Last Year: Never true  Transportation Needs: No Transportation Needs (06/30/2022)   PRAPARE - Radiographer, therapeutic (Medical): No    Lack of Transportation (Non-Medical): No  Physical Activity: Sufficiently Active (06/30/2022)   Exercise Vital Sign  Days of Exercise per Week: 7 days    Minutes of Exercise per Session: 30 min  Stress: No Stress Concern Present (06/30/2022)   [place]    Feeling of Stress : Not at all  Social Connections: [place] (06/30/2022)   Social Connection and Isolation Panel [NHANES]    Frequency of Communication with Friends and Family: More than three times a week    Frequency of Social Gatherings with Friends and Family: More than three times a week    Attends Religious Services: More than 4 times per year    Active Member of [employer] or Organizations: Yes    Attends Music therapist: More than 4 times per year    Marital Status: Married    Tobacco Counseling Counseling given: Not Answered   Clinical Intake:  Pre-visit preparation completed: No  Pain : No/denies pain     BMI - recorded: 33.64 Nutritional Status: BMI > 30  Obese Nutritional Risks: None Diabetes: No  How often do you need to have someone help you when you read instructions, pamphlets, or other written materials from your doctor or pharmacy?: 1 - Never  Diabetic?  No  Interpreter Needed?: No  Information entered by :: Rolene Arbour LPN   Activities of Daily Living    06/30/2022   11:02 AM 10/25/2021   11:00 PM  In your present state of health, do you have any difficulty performing the following activities:  Hearing? 0 0  Vision? 0 0  Difficulty concentrating or making decisions? 0 0  Walking or climbing stairs? 0 0  Dressing or bathing? 0 0  Doing errands, shopping? 0 0  Preparing Food and eating ? N   Using the Toilet? N   In the past six months, have you accidently leaked urine? N   Do you have problems with loss of bowel control? N   Managing your Medications? N   Managing your  Finances? N   Housekeeping or managing your Housekeeping? N     Patient Care Team: [name], MD as PCP - General (Family Medicine)  Indicate any recent Medical Services you may have received from other than [facility] providers in the past year (date may be approximate).     Assessment:   This is a routine wellness examination for [place].  Hearing/Vision screen Hearing Screening - Comments:: Denies hearing difficulties   Vision Screening - Comments:: Wears rx glasses - up to date with routine eye exams with  [facility]  Dietary issues and exercise activities discussed: Exercise limited by: None identified   Goals Addressed               This Visit's Progress     Patient stated (pt-stated)        I want to get stronger in my legs and arms.       Depression Screen    06/30/2022   11:01 AM 08/27/2021    9:45 AM 08/17/2021    9:22 AM 06/08/2021   10:55 AM 10/05/2020   10:56 AM 04/16/2018   12:02 PM  PHQ 2/9 Scores  PHQ - 2 Score 0 2 2 0 0 5  PHQ- 9 Score  _0 Fall Risk    06/30/2022   11:02 AM 08/27/2021    9:45 AM 06/08/2021   10:54 AM 10/05/2020   10:56 AM  Fall Risk   Falls in the past  year? 0 0 1 0  Comment   lost balance   Number falls in past yr: 0 0 0   Injury with Fall? 0 0 1   Risk for fall due to : No Fall Risks  Impaired balance/gait;Impaired mobility;Medication side effect   Follow up Falls prevention discussed  Falls evaluation completed;Education provided;Falls prevention discussed     FALL RISK PREVENTION PERTAINING TO THE HOME:  Any stairs in or around the home? No  If so, are there any without handrails? No  Home free of loose throw rugs in walkways, pet beds, electrical cords, etc? Yes  Adequate lighting in your home to reduce risk of falls? Yes   ASSISTIVE DEVICES UTILIZED TO PREVENT FALLS:  Life alert? No  Use of a cane, walker or w/c? No  Grab bars in the bathroom? No  Shower chair or bench in shower? No  Elevated toilet  seat or a handicapped toilet? No   TIMED UP AND GO:  Was the test performed? No . Audio Visit   Cognitive Function:        06/30/2022   11:03 AM  6CIT Screen  What Year? 0 points  What month? 0 points  What time? 0 points  Count back from 20 0 points  Months in reverse 0 points  Repeat phrase 0 points  Total Score 0 points    Immunizations Immunization History  Administered Date(s) Administered   Influenza,inj,Quad PF,6+ Mos 05/11/2015, 04/04/2017, 04/16/2018, 03/22/2019, 03/23/2020   PFIZER(Purple Top)SARS-COV-2 Vaccination 11/24/2019, 12/15/2019, 07/01/2020   Pfizer Covid-19 Vaccine Bivalent Booster 20yr & up 04/12/2021   Tdap 07/17/2008, 03/22/2019   Zoster Recombinat (Shingrix) 03/16/2021    TDAP status: Up to date  Flu Vaccine status: Due, Education has been provided regarding the importance of this vaccine. Advised may receive this vaccine at local pharmacy or Health Dept. Aware to provide a copy of the vaccination record if obtained from local pharmacy or Health Dept. Verbalized acceptance and understanding.    Covid-19 vaccine status: Completed vaccines  Qualifies for Shingles Vaccine? Yes   Zostavax completed No   Shingrix Completed?: No.    Education has been provided regarding the importance of this vaccine. Patient has been advised to call insurance company to determine out of pocket expense if they have not yet received this vaccine. Advised may also receive vaccine at local pharmacy or Health Dept. Verbalized acceptance and understanding.  Screening Tests Health Maintenance  Topic Date Due   FOOT EXAM  Never done   OPHTHALMOLOGY EXAM  Never done   Diabetic kidney evaluation - Urine ACR  Never done   PAP SMEAR-Modifier  02/12/2022   COVID-19 Vaccine (5 - 2023-24 season) 07/16/2022 (Originally 02/25/2022)   INFLUENZA VACCINE  09/25/2022 (Originally 01/25/2022)   Zoster Vaccines- Shingrix (2 of 2) 09/29/2022 (Originally 05/11/2021)   HEMOGLOBIN A1C   09/05/2022   Diabetic kidney evaluation - eGFR measurement  03/08/2023   Medicare Annual Wellness (AWV)  07/01/2023   MAMMOGRAM  11/19/2023   COLONOSCOPY (Pts [phone] coverage will need to be confirmed)  05/06/2026   DTaP/Tdap/Td (3 - Td or Tdap) 03/21/2029   Hepatitis C Screening  Completed   HIV Screening  Completed   HPV VACCINES  Aged Out    Health Maintenance  Health Maintenance Due  Topic Date Due   FOOT EXAM  Never done   OPHTHALMOLOGY EXAM  Never done   Diabetic kidney evaluation - Urine ACR  Never done   PAP SMEAR-Modifier  02/12/2022  Colorectal cancer screening: Type of screening: Colonoscopy. Completed 05/07/19. Repeat every 7 years  Mammogram status: Completed 11/18/21. Repeat every year    Lung Cancer Screening: (Low Dose CT Chest recommended if Age 24-80 years, 30 pack-year currently smoking OR have quit w/in 15years.) does not qualify.     Additional Screening:  Hepatitis C Screening: does qualify; Completed 07/06/15  Vision Screening: Recommended annual ophthalmology exams for early detection of glaucoma and other disorders of the eye. Is the patient up to date with their annual eye exam?  Yes  Who is the provider or what is the name of the office in which the patient attends annual eye exams? [facility] If pt is not established with a provider, would they like to be referred to a provider to establish care? No .   Dental Screening: Recommended annual dental exams for proper oral hygiene  Community Resource Referral / Chronic Care Management:  CRR required this visit?  No   CCM required this visit?  No      Plan:     I have personally reviewed and noted the following in the patient's chart:   Medical and social history Use of alcohol, tobacco or illicit drugs  Current medications and supplements including opioid prescriptions. Patient is currently taking opioid prescriptions. Information provided to patient regarding non-opioid  alternatives. Patient advised to discuss non-opioid treatment plan with their provider. Functional ability and status Nutritional status Physical activity Advanced directives List of other physicians Hospitalizations, surgeries, and ER visits in previous 12 months Vitals Screenings to include cognitive, depression, and falls Referrals and appointments  In addition, I have reviewed and discussed with patient certain preventive protocols, quality metrics, and best practice recommendations. A written personalized care plan for preventive services as well as general preventive health recommendations were provided to patient.     Criselda Peaches, LPN   07/04/5629   Nurse Notes: None

## 2022-06-30 NOTE — Patient Instructions (Addendum)
Ms. [name] Sherman , Thank you for taking time to come for your Medicare Wellness Visit. I appreciate your ongoing commitment to your health goals. Please review the following plan we discussed and let me know if I can assist you in the future.   These are the goals we discussed:  Goals       Patient Stated      06/08/2021, having weight loss surgery in January      Patient stated (pt-stated)      I want to get stronger in my legs and arms.        This is a list of the screening recommended for you and due dates:  Health Maintenance  Topic Date Due   Complete foot exam   Never done   Eye exam for diabetics  Never done   Yearly kidney health urinalysis for diabetes  Never done   Pap Smear  02/12/2022   COVID-19 Vaccine (5 - 2023-24 season) 07/16/2022*   Flu Shot  09/25/2022*   Zoster (Shingles) Vaccine (2 of 2) 09/29/2022*   Hemoglobin A1C  09/05/2022   Yearly kidney function blood test for diabetes  03/08/2023   Medicare Annual Wellness Visit  07/01/2023   Mammogram  11/19/2023   Colon Cancer Screening  05/06/2026   DTaP/Tdap/Td vaccine (3 - Td or Tdap) 03/21/2029   Hepatitis C Screening: USPSTF Recommendation to screen - Ages 39-79 yo.  Completed   HIV Screening  Completed   HPV Vaccine  Aged Out  *Topic was postponed. The date shown is not the original due date.   Opioid Pain Medicine Management Opioid pain medicines are strong medicines that are used to treat bad or very bad pain. When you take them for a short time, they can help you: Sleep better. Do better in physical therapy. Feel better during the first few days after you get hurt. Recover from surgery. Only take these medicines if a doctor says that you can. You should only take them for a short time. This is because opioids can be very addictive. This means that they are hard to stop taking. The longer you take opioids, the harder it may be to stop taking them. What are the risks? Opioids can cause problems (side  effects). Taking them for more than 3 days raises your chance of problems, such as: Trouble pooping (constipation). Feeling sick to your stomach (nausea). Vomiting. Feeling very sleepy. Confusion. Not being able to stop taking the medicine. Breathing problems. Taking opioids for a long time can make it hard for you to do daily tasks. It can also put you at risk for: Car accidents. Depression. Suicide. Heart attack. Taking too much of the medicine (overdose). This can lead to death. What is a pain treatment plan? A pain treatment plan is a plan made by you and your doctor. Work with your doctor to make a plan for treating your pain. To help you do this: Talk about the goals of your treatment, including: How much pain you might expect to have. How you will manage the pain. Talk about the risks and benefits of taking these medicines for your condition. Remember that a good treatment plan uses more than one approach and lowers the risks of side effects. Tell your doctor about the amount of medicines you take and about any drug or alcohol use. Get your pain medicine prescriptions from only one doctor. Pain can be managed with other treatments. Work with your doctor to find other ways to help your  pain, such as: Physical therapy or doing gentle exercises. Counseling. Eating healthy foods. Massage. Meditation. Other pain medicines. How to use opioid pain medicine safely Taking medicine Take your pain medicine exactly as told by your doctor. Take it only when you need it. If your pain is not too bad, you may take less medicine if your doctor allows. If you have no pain, do not take the medicine unless your doctor tells you to take it. If your pain is very bad, do not take more medicine than your doctor told you to take. Call your doctor to know what to do. Write down the times when you take your pain medicine. Look at the times before you take your next dose. Take other over-the-counter  or prescription medicines only as told by your doctor. Keeping yourself and others safe  While you are taking opioids: Do not drive, use machines, or power tools. Do not sign important papers (legal documents). Do not drink alcohol. Do not take sleeping pills. Do not take care of children by yourself. Do not do activities where you need to climb or be in high places, like working on a ladder. Do not go to a lake, river, ocean, swimming pool, or hot tub. Keep your opioids locked up or in a place where children cannot reach them. Do not share your pain medicine with anyone. Stopping your use of opioids If you have been taking opioids for more than a few weeks, you may need to slowly decrease (taper) how much you take until you stop taking them. Doing this can lower your chance of having symptoms.  Symptoms that come from suddenly stopping the use of opioids include: Pain and cramping in your belly (abdomen). Feeling sick to your stomach (nausea).z Sweating. Feeling very sleepy. Feeling restless. Shaking you cannot control (tremors). Cravings for the medicine. Do not try to stop taking them by yourself. Work with your doctor to stop. Your doctor will help you take less until you are not taking the medicine at all. Getting rid of unused pills Do not save any pills that you did not use. Get rid of the pills by: Taking them to a take-back program in your area. Bringing them to a pharmacy that receives unused pills. Flushing them down the toilet. Check the label or package insert of your medicine to see whether this is safe to do. Throwing them in the trash. Check the label or package insert of your medicine to see whether this is safe to do. If it is safe to throw them out: Take the pills out of their container. Put the pills into a container you can seal. Mix the pills with used coffee grounds, food scraps, dirt, or cat litter. Put this in the trash. Follow these instructions at  home: Activity Do exercises as told by your doctor. Avoid doing things that make your pain worse. Return to your normal activities as told by your doctor. Ask your doctor what activities are safe for you. General instructions You may need to take these actions to prevent or treat constipation: Drink enough fluid to keep your pee (urine) pale yellow. Take over-the-counter or prescription medicines. Eat foods that are high in fiber. These include beans, whole grains, and fresh fruits and vegetables. Limit foods that are high in fat and sugar. These include fried or sweet foods. Keep all follow-up visits. Where to find support If you have been taking opioids for a long time, get help from a local support group or   counselor. Ask your doctor about this. Where to find more information Centers for Disease Control and Prevention (CDC): www.cdc.gov [place] Food and Drug Administration (FDA): www.fda.gov Get help right away if: You may have taken too much of an opioid (overdosed). Common symptoms of an overdose: Your breathing is slower or more shallow than normal. You have a very slow heartbeat. Your speech is not normal. You vomit or you feel as if you may vomit. The black centers of your eyes (pupils) are smaller than normal. You have other potential symptoms: You feel very confused. You faint. You are very sleepy. You have cold skin. You have blue lips or fingernails. You have thoughts of harming yourself or harming others. These symptoms may be an emergency. Get help right away. Call your local emergency services (911 in the [place] Do not wait to see if the symptoms will go away. Do not drive yourself to the hospital. Get help right away if you feel like you may hurt yourself or others, or have thoughts about taking your own life. Go to your nearest emergency room or: Call your local emergency services (911 in the [place] Call the National Poison Control Center at 1-800-222-1222. Call a  suicide crisis helpline, such as the National Suicide Prevention Lifeline at 1-800-273-8255 or 988 in the [place] This is open 24 hours a day. Text the Crisis Text Line at 741741. Summary Opioid are strong medicines that are used to treat bad or very bad pain. A pain treatment plan is a plan made by you and your doctor. Work with your doctor to make a plan for treating your pain. If you think that you or someone else may have taken too much of an opioid, get help right away. This information is not intended to replace advice given to you by your health care provider. Make sure you discuss any questions you have with your health care provider. Document Revised: 01/06/2021 Document Reviewed: 09/23/2020 Elsevier Patient Education  2023 [employer]  Advanced directives: Advance directive discussed with you today. Even though you declined this today, please call our office should you change your mind, and we can give you the proper paperwork for you to fill out.   Conditions/risks identified: none  Next appointment: Follow up in one year for your annual wellness visit.    Preventive Care 40-64 Years, Female Preventive care refers to lifestyle choices and visits with your health care provider that can promote health and wellness. What does preventive care include? A yearly physical exam. This is also called an annual well check. Dental exams once or twice a year. Routine eye exams. Ask your health care provider how often you should have your eyes checked. Personal lifestyle choices, including: Daily care of your teeth and gums. Regular physical activity. Eating a healthy diet. Avoiding tobacco and drug use. Limiting alcohol use. Practicing safe sex. Taking low-dose aspirin daily starting at age 50. Taking vitamin and mineral supplements as recommended by your health care provider. What happens during an annual well check? The services and screenings done by your health care provider during  your annual well check will depend on your age, overall health, lifestyle risk factors, and family history of disease. Counseling  Your health care provider may ask you questions about your: Alcohol use. Tobacco use. Drug use. Emotional well-being. Home and relationship well-being. Sexual activity. Eating habits. Work and work environment. Method of birth control. Menstrual cycle. Pregnancy history. Screening  You may have the following tests or measurements: Height,   weight, and BMI. Blood pressure. Lipid and cholesterol levels. These may be checked every 5 years, or more frequently if you are over 50 years old. Skin check. Lung cancer screening. You may have this screening every year starting at age 55 if you have a 30-pack-year history of smoking and currently smoke or have quit within the past 15 years. Fecal occult blood test (FOBT) of the stool. You may have this test every year starting at age 50. Flexible sigmoidoscopy or colonoscopy. You may have a sigmoidoscopy every 5 years or a colonoscopy every 10 years starting at age 50. Hepatitis C blood test. Hepatitis B blood test. Sexually transmitted disease (STD) testing. Diabetes screening. This is done by checking your blood sugar (glucose) after you have not eaten for a while (fasting). You may have this done every 1-3 years. Mammogram. This may be done every 1-2 years. Talk to your health care provider about when you should start having regular mammograms. This may depend on whether you have a family history of breast cancer. BRCA-related cancer screening. This may be done if you have a family history of breast, ovarian, tubal, or peritoneal cancers. Pelvic exam and Pap test. This may be done every 3 years starting at age 21. Starting at age 30, this may be done every 5 years if you have a Pap test in combination with an HPV test. Bone density scan. This is done to screen for osteoporosis. You may have this scan if you are at  high risk for osteoporosis. Discuss your test results, treatment options, and if necessary, the need for more tests with your health care provider. Vaccines  Your health care provider may recommend certain vaccines, such as: Influenza vaccine. This is recommended every year. Tetanus, diphtheria, and acellular pertussis (Tdap, Td) vaccine. You may need a Td booster every 10 years. Zoster vaccine. You may need this after age 60. Pneumococcal 13-valent conjugate (PCV13) vaccine. You may need this if you have certain conditions and were not previously vaccinated. Pneumococcal polysaccharide (PPSV23) vaccine. You may need one or two doses if you smoke cigarettes or if you have certain conditions. Talk to your health care provider about which screenings and vaccines you need and how often you need them. This information is not intended to replace advice given to you by your health care provider. Make sure you discuss any questions you have with your health care provider. Document Released: 07/10/2015 Document Revised: 03/02/2016 Document Reviewed: 04/14/2015 Elsevier Interactive Patient Education  2017 [employer]    Fall Prevention in the Home Falls can cause injuries. They can happen to people of all ages. There are many things you can do to make your home safe and to help prevent falls. What can I do on the outside of my home? Regularly fix the edges of walkways and driveways and fix any cracks. Remove anything that might make you trip as you walk through a door, such as a raised step or threshold. Trim any bushes or trees on the path to your home. Use bright outdoor lighting. Clear any walking paths of anything that might make someone trip, such as rocks or tools. Regularly check to see if handrails are loose or broken. Make sure that both sides of any steps have handrails. Any raised decks and porches should have guardrails on the edges. Have any leaves, snow, or ice cleared  regularly. Use sand or salt on walking paths during winter. Clean up any spills in your garage right away. This includes   oil or grease spills. What can I do in the bathroom? Use night lights. Install grab bars by the toilet and in the tub and shower. Do not use towel bars as grab bars. Use non-skid mats or decals in the tub or shower. If you need to sit down in the shower, use a plastic, non-slip stool. Keep the floor dry. Clean up any water that spills on the floor as soon as it happens. Remove soap buildup in the tub or shower regularly. Attach bath mats securely with double-sided non-slip rug tape. Do not have throw rugs and other things on the floor that can make you trip. What can I do in the bedroom? Use night lights. Make sure that you have a light by your bed that is easy to reach. Do not use any sheets or blankets that are too big for your bed. They should not hang down onto the floor. Have a firm chair that has side arms. You can use this for support while you get dressed. Do not have throw rugs and other things on the floor that can make you trip. What can I do in the kitchen? Clean up any spills right away. Avoid walking on wet floors. Keep items that you use a lot in easy-to-reach places. If you need to reach something above you, use a strong step stool that has a grab bar. Keep electrical cords out of the way. Do not use floor polish or wax that makes floors slippery. If you must use wax, use non-skid floor wax. Do not have throw rugs and other things on the floor that can make you trip. What can I do with my stairs? Do not leave any items on the stairs. Make sure that there are handrails on both sides of the stairs and use them. Fix handrails that are broken or loose. Make sure that handrails are as long as the stairways. Check any carpeting to make sure that it is firmly attached to the stairs. Fix any carpet that is loose or worn. Avoid having throw rugs at the top or  bottom of the stairs. If you do have throw rugs, attach them to the floor with carpet tape. Make sure that you have a light switch at the top of the stairs and the bottom of the stairs. If you do not have them, ask someone to add them for you. What else can I do to help prevent falls? Wear shoes that: Do not have high heels. Have rubber bottoms. Are comfortable and fit you well. Are closed at the toe. Do not wear sandals. If you use a stepladder: Make sure that it is fully opened. Do not climb a closed stepladder. Make sure that both sides of the stepladder are locked into place. Ask someone to hold it for you, if possible. Clearly mark and make sure that you can see: Any grab bars or handrails. First and last steps. Where the edge of each step is. Use tools that help you move around (mobility aids) if they are needed. These include: Canes. Walkers. Scooters. Crutches. Turn on the lights when you go into a dark area. Replace any light bulbs as soon as they burn out. Set up your furniture so you have a clear path. Avoid moving your furniture around. If any of your floors are uneven, fix them. If there are any pets around you, be aware of where they are. Review your medicines with your doctor. Some medicines can make you feel dizzy. This can   increase your chance of falling. Ask your doctor what other things that you can do to help prevent falls. This information is not intended to replace advice given to you by your health care provider. Make sure you discuss any questions you have with your health care provider. Document Released: 04/09/2009 Document Revised: 11/19/2015 Document Reviewed: 07/18/2014 Elsevier Interactive Patient Education  2017 [employer].

## 2022-07-01 ENCOUNTER — Encounter (HOSPITAL_COMMUNITY): Payer: Self-pay | Admitting: *Deleted

## 2022-07-02 ENCOUNTER — Ambulatory Visit
Admission: EM | Admit: 2022-07-02 | Discharge: 2022-07-02 | Disposition: A | Discharge status: Home / Self Care | Payer: Medicare Other | Attending: Nurse Practitioner | Admitting: Nurse Practitioner

## 2022-07-02 DIAGNOSIS — B349 Viral infection, unspecified: Secondary | ICD-10-CM

## 2022-07-02 DIAGNOSIS — R051 Acute cough: Secondary | ICD-10-CM | POA: Diagnosis not present

## 2022-07-02 DIAGNOSIS — U071 COVID-19: Secondary | ICD-10-CM | POA: Insufficient documentation

## 2022-07-02 LAB — SARS CORONAVIRUS 2 (TAT 6-24 HRS): SARS Coronavirus 2: POSITIVE — AB

## 2022-07-02 MED ORDER — BENZONATATE 200 MG PO CAPS: 200.0000 mg | ORAL_CAPSULE | Freq: Three times a day (TID) | ORAL | 0 refills | Status: DC | PRN

## 2022-07-02 MED ORDER — OSELTAMIVIR PHOSPHATE 75 MG PO CAPS: 75.0000 mg | ORAL_CAPSULE | Freq: Two times a day (BID) | ORAL | 0 refills | Status: DC

## 2022-07-02 NOTE — ED Provider Notes (Signed)
[facility]    CSN: [id number] Arrival date & time: 07/02/22  1247      History   Chief Complaint Chief Complaint  Patient presents with   Headache   URI   Generalized Body Aches    HPI [name] Sherman is a 62 y.o. female. who presents for evaluation of URI symptoms for 2 days. Patient reports associated symptoms of sudden onset of productive cough, headache, fever of 101 degrees, sore throat, body aches, intermittent dizziness. Denies N/V/D, no pain or shortness of breath. Patient does not have a hx of asthma or smoking. No known sick contacts and no recent travel. Pt is vaccinated for COVID. Pt is not vaccinated for flu this season. Pt has taken cough medicine OTC for symptoms. Pt has no other concerns at this time.    Headache Associated symptoms: congestion, cough, dizziness, fever, myalgias, sore throat and URI   URI Presenting symptoms: congestion, cough, fever and sore throat   Associated symptoms: headaches and myalgias     Past Medical History:  Diagnosis Date   ADD (attention deficit disorder)    Anemia    Anxiety    Arthritis    shoulder, right (arthritis, tendonitis, bursitis)   Chronic low back pain    Complication of anesthesia    Dyspnea    Fatty liver    GERD (gastroesophageal reflux disease)    History of cervical cancer    s/p  laser ablation of cervix 1980's   History of COVID-19 07/2019   History of kidney stones    Hyperlipidemia    Hypertension    Hypothyroidism    IBS (irritable bowel syndrome)    MDD (major depressive disorder)    Migraine    OSA (obstructive sleep apnea)    pt used cpap up until 2013 states lost wt and did not need anymore   PONV (postoperative nausea and vomiting)    Restless legs    Sleep apnea    wears CPAP   Type 2 diabetes mellitus ([place])    Urgency of urination    Vertigo    Vitamin D deficiency    Vulvar cyst     Patient Active Problem List   Diagnosis Date Noted   Hypokalemia 10/26/2021    Transaminitis 10/26/2021   Incidental pulmonary nodule 10/26/2021   Type 2 diabetes mellitus ([place]) 10/26/2021   Anxiety and depression 10/26/2021   Tachycardia 10/26/2021   Elevated serum hCG 10/26/2021   Chest pain 10/25/2021   GERD (gastroesophageal reflux disease) 03/16/2021   Fatty liver 12/15/2020   Type 2 diabetes mellitus with hyperglycemia ([place]) 10/02/2020   Lumbar stenosis with neurogenic claudication 02/03/2020   Morbid obesity ([place]) 08/04/2019   Restless legs    Episodic recurrent vertigo 01/30/2019   Lumbar radiculopathy, chronic 01/21/2019   Hypothyroid 12/19/2016   Essential hypertension 09/21/2015   UTI (lower urinary tract infection) 05/01/2015   Ureteral stone with hydronephrosis 05/01/2015   ADD (attention deficit disorder) 12/26/2012   Incisional hernia, without obstruction or gangrene 12/26/2012   Migraine headache 11/14/2012   Hyperlipidemia 11/14/2012   OSA (obstructive sleep apnea) 11/14/2012    Past Surgical History:  Procedure Laterality Date   BUNIONECTOMY Right 2004   COLONOSCOPY     CYSTOSCOPY W/ RETROGRADES Bilateral 05/26/2015   Procedure: CYSTOSCOPY WITH RETROGRADE PYELOGRAM;  Surgeon: Festus Aloe, MD;  Location: [facility];  Service: Urology;  Laterality: Bilateral;   CYSTOSCOPY W/ URETERAL STENT REMOVAL Left 05/26/2015   Procedure: CYSTOSCOPY  WITH STENT REMOVAL;  Surgeon: Festus Aloe, MD;  Location: [facility];  Service: Urology;  Laterality: Left;   CYSTOSCOPY WITH RETROGRADE PYELOGRAM, URETEROSCOPY AND STENT PLACEMENT Left 05/01/2015   Procedure: CYSTOSCOPY WITH RETROGRADE PYELOGRAM AND STENT PLACEMENT;  Surgeon: Festus Aloe, MD;  Location: [facility];  Service: Urology;  Laterality: Left;   CYSTOSCOPY WITH STENT PLACEMENT Bilateral 05/26/2015   Procedure: CYSTOSCOPY WITH STENT PLACEMENT;  Surgeon: Festus Aloe, MD;  Location[facility];  Service: Urology;  Laterality: Bilateral;    CYSTOSCOPY WITH URETEROSCOPY Bilateral 05/26/2015   Procedure: CYSTOSCOPY WITH URETEROSCOPY;  Surgeon: Festus Aloe, MD;  Location: [facility];  Service: Urology;  Laterality: Bilateral;   CYSTOSCOPY/URETEROSCOPY/HOLMIUM LASER/STENT PLACEMENT Right 06/09/2015   Procedure: CYSTOSCOPY/URETEROSCOPY/STENT PLACEMENT REMOVAL LEFT URETERAL STENT;  Surgeon: Festus Aloe, MD;  [facility];  Service: Urology;  Laterality: Right;   DIAGNOSTIC LAPAROSCOPY     HOLMIUM LASER APPLICATION Left 88/41/6606   Procedure: HOLMIUM LASER APPLICATION;  Surgeon: Festus Aloe, MD;  Location: [facility];  Service: Urology;  Laterality: Left;   HOLMIUM LASER APPLICATION Right 30/16/0109   Procedure: HOLMIUM LASER APPLICATION;  Surgeon: Festus Aloe, MD;  Location: [facility];  Service: Urology;  Laterality: Right;   INSERTION OF MESH N/A 09/05/2016   Procedure: INSERTION OF MESH;  Surgeon: [name], MD;  Location: [place];  Service: General;  Laterality: N/A;   LAPAROSCOPIC ASSISTED VAGINAL HYSTERECTOMY  04/23/2002   '@[facility]'$   by Dr Willis Modena;  With Left Salpingoophorectomy/  Anterior Repair/  Tension Free Tape Sling placement   LAPAROSCOPIC GASTRIC SLEEVE RESECTION N/A 09/07/2021   Procedure: LAPAROSCOPIC GASTRIC SLEEVE RESECTION;  Surgeon: [name], MD;  [facility];  Service: General;  Laterality: N/A;   LASER ABLATION OF THE CERVIX  x2  1980's   POSTERIOR LAMINECTOMY / DECOMPRESSION LUMBAR SPINE  02/03/2020   '@[facility]   Dr Ellene Route;   L4--5 and fusion   POSTERIOR LUMBAR FUSION  01/21/2019   '@[facility]'$  by Dr Ellene Route;   L5--S1   TUBAL LIGATION  1980's   UPPER GI ENDOSCOPY N/A 09/07/2021   Procedure: UPPER GI ENDOSCOPY;  Surgeon: [name], MD;  [facility];  Service: General;  Laterality: N/A;   VENTRAL HERNIA REPAIR N/A 09/05/2016   Procedure: LAPAROSCOPIC VENTRAL HERNIA;  Surgeon: [name], MD;  Location: [place];  Service: General;  Laterality: N/A;     OB History   No obstetric history on file.      Home Medications    Prior to Admission medications   Medication Sig Start Date End Date Taking? Authorizing Provider  benzonatate (TESSALON) 200 MG capsule Take 1 capsule (200 mg total) by mouth 3 (three) times daily as needed for cough. 07/02/22  Yes Melynda Ripple, NP  oseltamivir (TAMIFLU) 75 MG capsule Take 1 capsule (75 mg total) by mouth every 12 (twelve) hours. 07/02/22  Yes Melynda Ripple, NP  Accu-Chek Softclix Lancets lancets Use as instructed Patient not taking: Reported on 04/07/2022 11/16/20   [name], MD  amphetamine-dextroamphetamine (ADDERALL) 30 MG tablet Take 1 tablet by mouth 2 (two) times daily. 06/06/22   Burchette, Alinda Sierras, MD  amphetamine-dextroamphetamine (ADDERALL) 30 MG tablet Take 1 tablet by mouth 2 (two) times daily. 06/06/22   Burchette, Alinda Sierras, MD  amphetamine-dextroamphetamine (ADDERALL) 30 MG tablet Take one tablet twice daily. 06/06/22   Burchette, Alinda Sierras, MD  atorvastatin (LIPITOR) 10 MG tablet Take 1 tablet (10  mg total) by mouth daily. 12/08/21   Burchette, Alinda Sierras, MD  blood glucose meter kit and supplies Dispense based on patient and insurance preference. Use up to four times daily as directed. (FOR ICD-10 E10.9, E11.9). Patient not taking: Reported on 04/07/2022 11/17/21   [name], MD  CALCIUM CITRATE PO Take 3 tablets by mouth 2 (two) times daily.    [provider]  cetirizine (ZYRTEC) 10 MG tablet Take 10 mg by mouth in the morning.    [provider]  citalopram (CELEXA) 20 MG tablet Take 1 tablet (20 mg total) by mouth daily. 11/17/21   Burchette, Alinda Sierras, MD  furosemide (LASIX) 20 MG tablet TAKE 1 TABLET BY MOUTH ONCE A DAY AS NEEDED FOR SWELLING Patient not taking: Reported on 04/07/2022 12/07/20   [name], MD  gentamicin cream (GARAMYCIN) 0.1 % Apply 1 Application topically 2 (two) times daily. 05/30/22   Edrick Kins, DPM  glucose blood (ACCU-CHEK  GUIDE) test strip Use to test blood sugars 3 times a day. Patient not taking: Reported on 04/07/2022 08/27/21   [name], MD  levothyroxine (SYNTHROID) 50 MCG tablet Take 1 tablet (50 mcg total) by mouth daily. 02/18/22   Burchette, Alinda Sierras, MD  losartan (COZAAR) 50 MG tablet Take 1 tablet (50 mg total) by mouth daily. 02/09/22   Burchette, Alinda Sierras, MD  NON FORMULARY Take 1 tablet by mouth See admin instructions. unnamed Bariatric vitamin- Take 1 tablet by mouth once a day    [provider]  nystatin cream (MYCOSTATIN) Apply 1 Application topically 2 (two) times daily. 02/09/22   Burchette, Alinda Sierras, MD  ondansetron (ZOFRAN-ODT) 4 MG disintegrating tablet Take 1 tablet (4 mg total) by mouth every 6 (six) hours as needed for nausea or vomiting. Patient not taking: Reported on 04/07/2022 09/08/21   [name], MD  oxyCODONE-acetaminophen (PERCOCET) 10-325 MG tablet Take 1 tablet by mouth in the morning, at noon, and at bedtime. Patient not taking: Reported on 04/07/2022 07/29/21   [provider]  SUMAtriptan (IMITREX) 100 MG tablet May repeat in 2 hours if headache persists or recurs.  Do not take more than 2 in 24 hours Patient taking differently: Take 100 mg by mouth every 2 (two) hours as needed for migraine (and may repeat in 2 hours if headache persists or recurs.  Do not take more than 2 in 24 hours.). 12/02/19   Burchette, Alinda Sierras, MD  pantoprazole (PROTONIX) 40 MG tablet TAKE 1 TABLET(40 MG) BY MOUTH TWICE DAILY 02/10/20   Armbruster, Carlota Raspberry, MD    Family History Family History  Problem Relation Age of Onset   Alcohol abuse Mother    Lung cancer Mother    Colon cancer Mother    Arthritis Father    Hyperlipidemia Father    Heart disease Father    Hypertension Father    COPD Father    Kidney disease Paternal Uncle    Cancer Maternal Grandmother        colon   Colon cancer Maternal Grandmother    Rectal cancer Maternal Grandmother    Cancer Paternal  Grandmother        breast   Kidney cancer Sister        lung cancer   Cirrhosis Sister    Esophageal cancer Neg Hx    Stomach cancer Neg Hx     Social History Social History   Tobacco Use   Smoking status: Never  Smokeless tobacco: Never  Vaping Use   Vaping Use: Never used  Substance Use Topics   Alcohol use: Yes    Comment: socially   Drug use: Never     Allergies   Keflex [cephalexin]   Review of Systems Review of Systems  Constitutional:  Positive for fever.  HENT:  Positive for congestion and sore throat.   Respiratory:  Positive for cough.   Musculoskeletal:  Positive for myalgias.  Neurological:  Positive for dizziness and headaches.     Physical Exam Triage Vital Signs ED Triage Vitals  Enc Vitals Group     BP 07/02/22 1303 104/72     Pulse Rate 07/02/22 1303 (!) 104     Resp 07/02/22 1303 18     Temp 07/02/22 1303 98.4 F (36.9 C)     Temp Source 07/02/22 1303 Oral     SpO2 07/02/22 1303 92 %     Weight --      Height --      Head Circumference --      Peak Flow --      Pain Score 07/02/22 1302 7     Pain Loc --      Pain Edu? --      Excl. in [place]? --    No data found.  Updated Vital Signs BP 104/72 (BP Location: Left Arm)   Pulse (!) 104   Temp 98.4 F (36.9 C) (Oral)   Resp 18   SpO2 92%   Visual Acuity Right Eye Distance:   Left Eye Distance:   Bilateral Distance:    Right Eye Near:   Left Eye Near:    Bilateral Near:     Physical Exam Vitals and nursing note reviewed.  Constitutional:      General: She is not in acute distress.    Appearance: She is well-developed. She is not ill-appearing.  HENT:     Head: Normocephalic and atraumatic.     Right Ear: Tympanic membrane and ear canal normal.     Left Ear: Tympanic membrane and ear canal normal.     Nose: Congestion present.     Mouth/Throat:     Mouth: Mucous membranes are moist.     Pharynx: Oropharynx is clear. Uvula midline. Posterior oropharyngeal erythema  present.     Tonsils: No tonsillar exudate or tonsillar abscesses.  Eyes:     Conjunctiva/sclera: Conjunctivae normal.     Pupils: Pupils are equal, round, and reactive to light.  Cardiovascular:     Rate and Rhythm: Normal rate and regular rhythm.     Heart sounds: Normal heart sounds.  Pulmonary:     Effort: Pulmonary effort is normal.     Breath sounds: Normal breath sounds.  Musculoskeletal:     Cervical back: Normal range of motion and neck supple.  Lymphadenopathy:     Cervical: No cervical adenopathy.  Skin:    General: Skin is warm and dry.  Neurological:     General: No focal deficit present.     Mental Status: She is alert and oriented to person, place, and time.  Psychiatric:        Mood and Affect: Mood normal.        Behavior: Behavior normal.      UC Treatments / Results  Labs (all labs ordered are listed, but only abnormal results are displayed) Labs Reviewed  SARS CORONAVIRUS 2 (TAT 6-24 HRS)    EKG   Radiology No results found.  Procedures  Procedures (including critical care time)  Medications Ordered in UC Medications - No data to display  Initial Impression / Assessment and Plan / UC Course  I have reviewed the triage vital signs and the nursing notes.  Pertinent labs & imaging results that were available during my care of the patient were reviewed by me and considered in my medical decision making (see chart for details).     COVID PCR and will contact if positive Reported symptoms consistent with influenza, start Tamiflu Tessalon.  Cough Rest and fluids PCP follow-up 2 to 3 days for recheck ER precautions reviewed and patient verbalized understanding Final Clinical Impressions(s) / UC Diagnoses   Final diagnoses:  Acute cough  Viral illness     Discharge Instructions      Tamiflu as prescribed Tessalon as needed for cough Rest and fluids Follow-up with your PCP 2 to 3 days for recheck Please go to the emergency room if you  have any worsening symptoms   ED Prescriptions     Medication Sig Dispense Auth. Provider   oseltamivir (TAMIFLU) 75 MG capsule Take 1 capsule (75 mg total) by mouth every 12 (twelve) hours. 10 capsule Melynda Ripple, NP   benzonatate (TESSALON) 200 MG capsule Take 1 capsule (200 mg total) by mouth 3 (three) times daily as needed for cough. 20 capsule Melynda Ripple, NP      PDMP not reviewed this encounter.   Melynda Ripple, NP 07/02/22 1326

## 2022-07-02 NOTE — ED Triage Notes (Signed)
Pt presents with ongoing headache, cough , sore throat, dizziness, and generalized body aches X 2 days.

## 2022-07-02 NOTE — Discharge Instructions (Signed)
Tamiflu as prescribed Tessalon as needed for cough Rest and fluids Follow-up with your PCP 2 to 3 days for recheck Please go to the emergency room if you have any worsening symptoms

## 2022-07-03 MED ORDER — PAXLOVID (300/100) 20 X 150 MG & 10 X 100MG PO TBPK: 3.0000 | ORAL_TABLET | Freq: Two times a day (BID) | ORAL | 0 refills | Status: AC

## 2022-07-03 NOTE — Telephone Encounter (Signed)
Patient seen yesterday in urgent care, positive COVID test today.  Patient contacted clinic wanting Paxlovid.  CMP from September 2023 reviewed.  No kidney or liver function issues.  Reviewed patient medication list.  She will stop her Lipitor and Adderall while taking the medication and for 5 days after completion.  This was relayed to patient by the nurse and she verbalized understanding.  Was prescribed Tamiflu initially for flulike symptoms.  She was instructed to stop this.  Paxlovid sent to pharmacy.

## 2022-07-06 ENCOUNTER — Encounter: Payer: Self-pay | Admitting: Family Medicine

## 2022-07-07 MED ORDER — AMPHETAMINE-DEXTROAMPHETAMINE 30 MG PO TABS: ORAL_TABLET | ORAL | 0 refills | Status: DC

## 2022-07-07 NOTE — Telephone Encounter (Signed)
Prescription sent to [facility] at [facility]

## 2022-07-08 DIAGNOSIS — E119 Type 2 diabetes mellitus without complications: Secondary | ICD-10-CM | POA: Diagnosis not present

## 2022-07-08 LAB — HM DIABETES EYE EXAM

## 2022-07-13 ENCOUNTER — Ambulatory Visit: Payer: Medicare Other | Admitting: Plastic Surgery

## 2022-07-29 ENCOUNTER — Encounter: Payer: Self-pay | Admitting: Family Medicine

## 2022-07-29 MED ORDER — SUMATRIPTAN SUCCINATE 100 MG PO TABS: ORAL_TABLET | ORAL | 1 refills | Status: AC

## 2022-08-09 ENCOUNTER — Encounter: Payer: Self-pay | Admitting: Family Medicine

## 2022-08-10 ENCOUNTER — Encounter: Payer: Self-pay | Admitting: Family Medicine

## 2022-08-10 ENCOUNTER — Ambulatory Visit (INDEPENDENT_AMBULATORY_CARE_PROVIDER_SITE_OTHER): Payer: Medicare Other | Admitting: Family Medicine

## 2022-08-10 VITALS — BP 116/66 | HR 100 | Temp 98.1°F | Wt 202.2 lb

## 2022-08-10 DIAGNOSIS — R42 Dizziness and giddiness: Secondary | ICD-10-CM | POA: Diagnosis not present

## 2022-08-10 DIAGNOSIS — R911 Solitary pulmonary nodule: Secondary | ICD-10-CM

## 2022-08-10 DIAGNOSIS — J3489 Other specified disorders of nose and nasal sinuses: Secondary | ICD-10-CM

## 2022-08-10 MED ORDER — MUPIROCIN 2 % EX OINT
1.0000 | TOPICAL_OINTMENT | Freq: Two times a day (BID) | CUTANEOUS | 0 refills | Status: DC
Start: 2022-08-10 — End: 2024-05-03

## 2022-08-10 NOTE — Patient Instructions (Signed)
I will be setting up ENT referral.

## 2022-08-10 NOTE — Progress Notes (Unsigned)
Established Patient Office Visit  Subjective   Patient ID: [name] KALISCH, female    DOB: 1961-03-27  Age: 62 y.o. MRN: [id number]  Chief Complaint  Patient presents with   Dizziness    Patient complains of dizziness, x4 days, Denies any blurred or double vision   Headache         HPI  {History (Optional):23778} [name] Sherman is seen today for several issues as follows  Her main complaint is that she has had some very transient episodes of what sounds like vertigo since Saturday.  First episode was Saturday and she had a subsequent episode Sunday and into Monday.  None today.  She describes sensation of feeling unstable but no syncopal or presyncopal type symptoms.  No lightheadedness.  No tinnitus.  No acute hearing changes.  Had some mild nausea but no vomiting.  Has had some recent increased nasal congestion bilaterally.  No acute visual changes.  No diplopia.  No ataxia.  No focal weakness.  No cognitive changes.  She has had several weeks if not months of bilateral nasal congestion and dryness.  Does not use a humidifier.  No loss of taste or smell.  She states she had COVID few weeks ago and this is her second episode.  She had CT angiogram of the chest last May with incidental finding of 9 mm pleural-based noncalcified right lower lobe nodule.  We have been trying to order CT chest and this has been placed 3 times and she has still not been called.  Generally feels well overall.  She is continue to have some slow steady weight loss since her gastric bypass surgery.  Her weight has plateaued somewhat.  Recently joined a gym.  Past Medical History:  Diagnosis Date   ADD (attention deficit disorder)    Anemia    Anxiety    Arthritis    shoulder, right (arthritis, tendonitis, bursitis)   Chronic low back pain    Complication of anesthesia    Dyspnea    Fatty liver    GERD (gastroesophageal reflux disease)    History of cervical cancer    s/p  laser ablation of cervix 1980's    History of COVID-19 07/2019   History of kidney stones    Hyperlipidemia    Hypertension    Hypothyroidism    IBS (irritable bowel syndrome)    MDD (major depressive disorder)    Migraine    OSA (obstructive sleep apnea)    pt used cpap up until 2013 states lost wt and did not need anymore   PONV (postoperative nausea and vomiting)    Restless legs    Sleep apnea    wears CPAP   Type 2 diabetes mellitus ([place])    Urgency of urination    Vertigo    Vitamin D deficiency    Vulvar cyst    Past Surgical History:  Procedure Laterality Date   BUNIONECTOMY Right 2004   COLONOSCOPY     CYSTOSCOPY W/ RETROGRADES Bilateral 05/26/2015   Procedure: CYSTOSCOPY WITH RETROGRADE PYELOGRAM;  Surgeon: Festus Aloe, MD;  Location: [facility];  Service: Urology;  Laterality: Bilateral;   CYSTOSCOPY W/ URETERAL STENT REMOVAL Left 05/26/2015   Procedure: CYSTOSCOPY WITH STENT REMOVAL;  Surgeon: Festus Aloe, MD;  Location: [facility];  Service: Urology;  Laterality: Left;   CYSTOSCOPY WITH RETROGRADE PYELOGRAM, URETEROSCOPY AND STENT PLACEMENT Left 05/01/2015   Procedure: CYSTOSCOPY WITH RETROGRADE PYELOGRAM AND STENT PLACEMENT;  Surgeon: Festus Aloe, MD;  Location[facility];  Service: Urology;  Laterality: Left;   CYSTOSCOPY WITH STENT PLACEMENT Bilateral 05/26/2015   Procedure: CYSTOSCOPY WITH STENT PLACEMENT;  Surgeon: Festus Aloe, MD;  Location[facility];  Service: Urology;  Laterality: Bilateral;   CYSTOSCOPY WITH URETEROSCOPY Bilateral 05/26/2015   Procedure: CYSTOSCOPY WITH URETEROSCOPY;  Surgeon: Festus Aloe, MD;  Location: [facility];  Service: Urology;  Laterality: Bilateral;   CYSTOSCOPY/URETEROSCOPY/HOLMIUM LASER/STENT PLACEMENT Right 06/09/2015   Procedure: CYSTOSCOPY/URETEROSCOPY/STENT PLACEMENT REMOVAL LEFT URETERAL STENT;  Surgeon: Festus Aloe, MD;  Location[facility];  Service: Urology;  Laterality:  Right;   DIAGNOSTIC LAPAROSCOPY     HOLMIUM LASER APPLICATION Left [id number]   Procedure: HOLMIUM LASER APPLICATION;  Surgeon: Festus Aloe, MD;  Location: [facility];  Service: Urology;  Laterality: Left;   HOLMIUM LASER APPLICATION Right 0000000   Procedure: HOLMIUM LASER APPLICATION;  Surgeon: Festus Aloe, MD;  Location[facility];  Service: Urology;  Laterality: Right;   INSERTION OF MESH N/A 09/05/2016   Procedure: INSERTION OF MESH;  Surgeon: [name], MD;  Location: [place];  Service: General;  Laterality: N/A;   LAPAROSCOPIC ASSISTED VAGINAL HYSTERECTOMY  04/23/2002   @[facility]   by Dr Willis Modena;  With Left Salpingoophorectomy/  Anterior Repair/  Tension Free Tape Sling placement   LAPAROSCOPIC GASTRIC SLEEVE RESECTION N/A 09/07/2021   Procedure: LAPAROSCOPIC GASTRIC SLEEVE RESECTION;  Surgeon: [name], MD;  [facility];  Service: General;  Laterality: N/A;   LASER ABLATION OF THE CERVIX  x2  1980's   POSTERIOR LAMINECTOMY / DECOMPRESSION LUMBAR SPINE  02/03/2020   [facility]   Dr Ellene Route;   L4--5 and fusion   POSTERIOR LUMBAR FUSION  01/21/2019   @[facility]  by Dr Ellene Route;   L5--S1   TUBAL LIGATION  1980's   UPPER GI ENDOSCOPY N/A 09/07/2021   Procedure: UPPER GI ENDOSCOPY;  Surgeon: [name], MD;  Location[facility];  Service: General;  Laterality: N/A;   VENTRAL HERNIA REPAIR N/A 09/05/2016   Procedure: LAPAROSCOPIC VENTRAL HERNIA;  Surgeon: [name], MD;  Location: [place];  Service: General;  Laterality: N/A;    reports that she has never smoked. She has never used smokeless tobacco. She reports current alcohol use. She reports that she does not use drugs. family history includes Alcohol abuse in her mother; Arthritis in her father; COPD in her father; Cancer in her maternal grandmother and paternal grandmother; Cirrhosis in her sister; Colon cancer in her maternal grandmother and mother; Heart disease in her father; Hyperlipidemia in her father;  Hypertension in her father; Kidney cancer in her sister; Kidney disease in her paternal uncle; Lung cancer in her mother; Rectal cancer in her maternal grandmother. Allergies  Allergen Reactions   Keflex [Cephalexin] Swelling and Other (See Comments)    Tongue swelling      Review of Systems  Constitutional:  Negative for chills, fever and weight loss.  HENT:  Positive for congestion.   Neurological:  Positive for dizziness. Negative for speech change, focal weakness, seizures, loss of consciousness and weakness.      Objective:     BP 116/66 (BP Location: Left Arm, Patient Position: Sitting, Cuff Size: Large)   Pulse 100   Temp 98.1 F (36.7 C) (Oral)   Wt 202 lb 3.2 oz (91.7 kg)   SpO2 98%   BMI 34.71 kg/m  {Vitals History (Optional):23777}  Physical Exam Vitals reviewed.  Constitutional:      Appearance: She is well-developed.  HENT:     Head:     Comments: Nasal exam reveals some dried blood Kiesselbach plexus right naris.  Left nasal cavity reveals polypoid type mass laterally.  No evidence for any blood clots.  No necrosis. Eyes:     Pupils: Pupils are equal, round, and reactive to light.  Neck:     Thyroid: No thyromegaly.     Vascular: No JVD.  Cardiovascular:     Rate and Rhythm: Normal rate and regular rhythm.     Heart sounds:     No gallop.  Pulmonary:     Effort: Pulmonary effort is normal. No respiratory distress.     Breath sounds: Normal breath sounds. No wheezing or rales.  Musculoskeletal:     Cervical back: Neck supple.  Neurological:     General: No focal deficit present.     Mental Status: She is alert.     Cranial Nerves: No cranial nerve deficit.     Motor: No weakness.     Coordination: Coordination normal.     Gait: Gait normal.      No results found for any visits on 08/10/22.  {Labs (Optional):23779}  The 10-year ASCVD risk score (Arnett DK, et al., 2019) is: 5.9%    Assessment & Plan:   #1 intermittent vertigo type  symptoms which are very transient past several days.  None today.  Question benign peripheral positional vertigo.  Nonfocal neuroexam at this time.  Recommend observation.  Consider Epley maneuvers if more persistent.  Handout on vertigo given  #2 polypoid mass left nasal cavity.  Set up ENT referral  #3 history of 9 mm pleural based noncalcified nodule.  Trying to get repeat CT chest set up   Carolann Littler, MD

## 2022-08-12 ENCOUNTER — Telehealth: Payer: Self-pay

## 2022-08-12 DIAGNOSIS — J3489 Other specified disorders of nose and nasal sinuses: Secondary | ICD-10-CM

## 2022-08-12 NOTE — Telephone Encounter (Signed)
-----   Message from [name], MD sent at 08/10/2022  5:29 PM EST ----- Looks like Dr Benjamine Mola no longer takes her insurance.  See if she is okay with referring to [facility] ----- Message ----- From: Nilda Riggs, CMA Sent: 08/10/2022   5:04 PM EST To: [name], MD   ----- Message ----- From: Sheila Oats Sent: 08/10/2022   4:27 PM EST To: [facility]  Please abstract and route to provider

## 2022-08-12 NOTE — Telephone Encounter (Signed)
Referral placed and patient agreed to referral to [facility]

## 2022-08-12 NOTE — Telephone Encounter (Signed)
-----   Message from [name], MD sent at 08/10/2022  5:29 PM EST ----- Looks like Dr Benjamine Mola no longer takes her insurance.  See if she is okay with referring to [facility] ----- Message ----- From: Nilda Riggs, CMA Sent: 08/10/2022   5:04 PM EST To: [name], MD   ----- Message ----- From: Sheila Oats Sent: 08/10/2022   4:27 PM EST To: [facility]  Please abstract and route to provider

## 2022-08-18 DIAGNOSIS — M961 Postlaminectomy syndrome, not elsewhere classified: Secondary | ICD-10-CM | POA: Diagnosis not present

## 2022-08-18 DIAGNOSIS — M5416 Radiculopathy, lumbar region: Secondary | ICD-10-CM | POA: Diagnosis not present

## 2022-08-21 ENCOUNTER — Ambulatory Visit (HOSPITAL_BASED_OUTPATIENT_CLINIC_OR_DEPARTMENT_OTHER)
Admission: RE | Admit: 2022-08-21 | Discharge: 2022-08-21 | Disposition: A | Discharge status: Home / Self Care | Payer: Medicare Other | Source: Ambulatory Visit | Attending: Family Medicine | Admitting: Family Medicine

## 2022-08-21 DIAGNOSIS — R911 Solitary pulmonary nodule: Secondary | ICD-10-CM | POA: Diagnosis not present

## 2022-08-24 ENCOUNTER — Other Ambulatory Visit: Payer: Self-pay | Admitting: Family Medicine

## 2022-08-24 ENCOUNTER — Telehealth: Payer: Self-pay | Admitting: Family Medicine

## 2022-08-24 NOTE — Telephone Encounter (Signed)
Says she found a pharmacy that has this medication, they may run out today so asking that this is sent asap. amphetamine-dextroamphetamine (ADDERALL) 30 MG tablet  [place] Phone: [phone]  Fax: [phone]

## 2022-08-25 ENCOUNTER — Other Ambulatory Visit (HOSPITAL_BASED_OUTPATIENT_CLINIC_OR_DEPARTMENT_OTHER): Payer: Self-pay

## 2022-08-25 ENCOUNTER — Encounter (HOSPITAL_BASED_OUTPATIENT_CLINIC_OR_DEPARTMENT_OTHER): Payer: Self-pay | Admitting: Pharmacist

## 2022-08-25 MED ORDER — AMPHETAMINE-DEXTROAMPHETAMINE 30 MG PO TABS
ORAL_TABLET | ORAL | 0 refills | Status: DC
  Filled 2022-08-25: qty 60, fill #0
  Filled 2022-08-27: qty 60, 30d supply, fill #0

## 2022-08-25 NOTE — Addendum Note (Signed)
Addended by: Apolinar Junes on: 08/25/2022 10:39 AM   Modules accepted: Orders

## 2022-08-25 NOTE — Telephone Encounter (Signed)
Noted  

## 2022-08-27 ENCOUNTER — Other Ambulatory Visit (HOSPITAL_BASED_OUTPATIENT_CLINIC_OR_DEPARTMENT_OTHER): Payer: Self-pay

## 2022-09-09 DIAGNOSIS — M48062 Spinal stenosis, lumbar region with neurogenic claudication: Secondary | ICD-10-CM | POA: Diagnosis not present

## 2022-09-14 ENCOUNTER — Encounter: Payer: Self-pay | Admitting: Family Medicine

## 2022-09-14 ENCOUNTER — Ambulatory Visit (INDEPENDENT_AMBULATORY_CARE_PROVIDER_SITE_OTHER): Payer: Medicare Other | Admitting: Family Medicine

## 2022-09-14 VITALS — BP 118/80 | HR 97 | Temp 98.4°F | Ht 64.0 in | Wt 193.0 lb

## 2022-09-14 DIAGNOSIS — K529 Noninfective gastroenteritis and colitis, unspecified: Secondary | ICD-10-CM

## 2022-09-14 DIAGNOSIS — F419 Anxiety disorder, unspecified: Secondary | ICD-10-CM | POA: Insufficient documentation

## 2022-09-14 DIAGNOSIS — D649 Anemia, unspecified: Secondary | ICD-10-CM | POA: Insufficient documentation

## 2022-09-14 DIAGNOSIS — Z903 Acquired absence of stomach [part of]: Secondary | ICD-10-CM

## 2022-09-14 NOTE — Progress Notes (Signed)
   Established Patient Office Visit   Subjective  Patient ID: [name] Sherman, female    DOB: [birth year]  Age: 62 y.o. MRN: [id number]  No chief complaint on file.   Patient is a 62 year old female with pmh sig for obesity status post gastric sleeve, history of chronic back pain on opioids, HTN, OSA, migraines, hypothyroidism, HLD, vertigo, fatty liver, DM 2, RLS, incidental lung nodule, history of renal calculi who is followed by Dr. Elease Hashimoto and seen for acute concern.  Patient endorses feeling nausea on Monday afternoon, then emesis Monday evening.  Patient denies any changes in diet.  Typically has a protein shake given history of gastric procedure.  She also developed diarrhea and decreased appetite due to the nausea.  Symptoms improving today.  Patient has a little bit more of an appetite but was hesitant to eat.  Possible sick contacts include clients at work.  Patient works in substance abuse.  Patient's husband has allergy symptoms but denies GI symptoms.  Patient states she needs a note for work as she missed yesterday and today.      ROS Negative unless stated above    Objective:     BP 118/80 (BP Location: Left Arm, Patient Position: Sitting, Cuff Size: Normal)   Pulse 97   Temp 98.4 F (36.9 C) (Oral)   Ht 5\' 4"  (1.626 m)   Wt 193 lb (87.5 kg)   SpO2 98%   BMI 33.13 kg/m    Physical Exam Constitutional:      General: She is not in acute distress.    Appearance: Normal appearance.  HENT:     Head: Normocephalic and atraumatic.     Nose: Nose normal.     Mouth/Throat:     Mouth: Mucous membranes are moist.  Eyes:     Extraocular Movements: Extraocular movements intact.     Conjunctiva/sclera: Conjunctivae normal.  Cardiovascular:     Rate and Rhythm: Normal rate and regular rhythm.     Heart sounds: Normal heart sounds. No murmur heard.    No gallop.  Pulmonary:     Effort: Pulmonary effort is normal. No respiratory distress.     Breath sounds: Normal  breath sounds. No wheezing, rhonchi or rales.  Abdominal:     General: Bowel sounds are increased. There is no distension.     Palpations: Abdomen is soft.     Tenderness: There is generalized abdominal tenderness. There is no guarding or rebound.  Skin:    General: Skin is warm and dry.  Neurological:     Mental Status: She is alert and oriented to person, place, and time.      No results found for any visits on 09/14/22.    Assessment & Plan:  Gastroenteritis  S/P gastric sleeve procedure -had procedure 1 yr ago -continue BMV -Avoid NSAIDs  Acute GI symptoms now resolving likely viral in nature.  Discussed OTC meds as needed for loose stools.  Can use Zofran pt has at home if needed for continued nausea/vomiting.  Discussed advancing diet as tolerated and hydration.  Hand hygiene also encouraged.  Given note for work.  Given strict precautions.  Return if symptoms worsen or fail to improve.   Billie Ruddy, MD

## 2022-09-15 ENCOUNTER — Other Ambulatory Visit (HOSPITAL_BASED_OUTPATIENT_CLINIC_OR_DEPARTMENT_OTHER): Payer: Self-pay

## 2022-09-15 ENCOUNTER — Telehealth: Payer: Self-pay | Admitting: Family Medicine

## 2022-09-15 ENCOUNTER — Other Ambulatory Visit: Payer: Self-pay

## 2022-09-15 MED ORDER — OXYCODONE-ACETAMINOPHEN 10-325 MG PO TABS
1.0000 | ORAL_TABLET | Freq: Three times a day (TID) | ORAL | 0 refills | Status: DC | PRN
  Filled 2022-09-15: qty 90, 30d supply, fill #0

## 2022-09-15 MED ORDER — OXYCODONE-ACETAMINOPHEN 10-325 MG PO TABS
1.0000 | ORAL_TABLET | Freq: Three times a day (TID) | ORAL | 0 refills | Status: DC | PRN
  Filled 2022-10-15: qty 90, 30d supply, fill #0

## 2022-09-15 NOTE — Telephone Encounter (Signed)
Patient states her symptoms are still persisting and she is requesting her work excuse letter be adjusted to include her not going to work today.says letter can be loaded to [employer]

## 2022-09-16 NOTE — Telephone Encounter (Signed)
A letter was sent to pt mychart.

## 2022-09-16 NOTE — Telephone Encounter (Signed)
Ok to extend note from 3/20-3/22/24.

## 2022-09-16 NOTE — Telephone Encounter (Signed)
Pt was seen by Dr. Volanda Napoleon on 09/14/22.  Pt called to say she is still very sick and is asking if MD could please amend her doctors note so that she has more time to recuperate.  Pt would like a doctors note stating she is out of work from 09/14/22 - 09/16/22.  Returning to work on Monday 09/19/22.  Please advise.

## 2022-09-19 ENCOUNTER — Telehealth: Payer: Self-pay

## 2022-09-19 DIAGNOSIS — J342 Deviated nasal septum: Secondary | ICD-10-CM | POA: Diagnosis not present

## 2022-09-19 DIAGNOSIS — J343 Hypertrophy of nasal turbinates: Secondary | ICD-10-CM | POA: Diagnosis not present

## 2022-09-19 DIAGNOSIS — J3489 Other specified disorders of nose and nasal sinuses: Secondary | ICD-10-CM | POA: Diagnosis not present

## 2022-09-19 DIAGNOSIS — R0981 Nasal congestion: Secondary | ICD-10-CM | POA: Diagnosis not present

## 2022-09-19 NOTE — Telephone Encounter (Signed)
Attempted to reach pt. Call cannot be completed.

## 2022-09-19 NOTE — Telephone Encounter (Signed)
Attempted to reach pt and to inform her of work note. Call unable to complete.    Sent [employer].

## 2022-09-19 NOTE — Telephone Encounter (Signed)
Patient called and stated she has the note.

## 2022-09-19 NOTE — Telephone Encounter (Signed)
Sent mychart message to pt. 

## 2022-09-20 NOTE — Telephone Encounter (Signed)
Noted  

## 2022-09-21 ENCOUNTER — Ambulatory Visit: Payer: Medicare Other | Admitting: Plastic Surgery

## 2022-09-21 ENCOUNTER — Encounter: Payer: Self-pay | Admitting: Plastic Surgery

## 2022-09-21 VITALS — Ht 64.0 in | Wt 192.0 lb

## 2022-09-21 DIAGNOSIS — R21 Rash and other nonspecific skin eruption: Secondary | ICD-10-CM | POA: Diagnosis not present

## 2022-09-21 DIAGNOSIS — M793 Panniculitis, unspecified: Secondary | ICD-10-CM | POA: Diagnosis not present

## 2022-09-21 NOTE — Progress Notes (Signed)
[name] Sherman returns today to discuss a panniculectomy.  I had previously seen her in October and she is now ready to schedule.  She has elected not to have the upper abdominal abdomen but would like to stick with the panniculectomy alone.  She does state that she has had an increased number of rashes recently and has found keeping the area dry has been more difficult.  We discussed panniculectomy at length today including the location of the scars, the need for compression, the use of drains postoperatively.  We discussed the postoperative restrictions including no heavy lifting greater than 20 pounds, no vigorous activity, and no submerging the incisions in water for 6 weeks.  She she may return to work as tolerated but I believe that taking 2 weeks off is reasonable.  She may elect to have the umbilicus removed as well to prevent infections within the umbilicus.  She will let me know at the preoperative appointment.  Photographs were obtained today with her consent.  All questions were answered to her satisfaction.  Will proceed with panniculectomy at her request.

## 2022-09-23 ENCOUNTER — Other Ambulatory Visit: Payer: Self-pay | Admitting: Adult Health

## 2022-09-23 ENCOUNTER — Other Ambulatory Visit (HOSPITAL_BASED_OUTPATIENT_CLINIC_OR_DEPARTMENT_OTHER): Payer: Self-pay

## 2022-09-27 ENCOUNTER — Other Ambulatory Visit (HOSPITAL_BASED_OUTPATIENT_CLINIC_OR_DEPARTMENT_OTHER): Payer: Self-pay

## 2022-09-27 MED ORDER — AMPHETAMINE-DEXTROAMPHETAMINE 30 MG PO TABS
ORAL_TABLET | ORAL | 0 refills | Status: DC
Start: 2022-09-27 — End: 2022-10-27
  Filled 2022-09-27: qty 60, 30d supply, fill #0

## 2022-09-28 DIAGNOSIS — M5126 Other intervertebral disc displacement, lumbar region: Secondary | ICD-10-CM | POA: Diagnosis not present

## 2022-09-28 DIAGNOSIS — M48062 Spinal stenosis, lumbar region with neurogenic claudication: Secondary | ICD-10-CM | POA: Diagnosis not present

## 2022-09-29 DIAGNOSIS — M79605 Pain in left leg: Secondary | ICD-10-CM | POA: Diagnosis not present

## 2022-09-29 DIAGNOSIS — G2581 Restless legs syndrome: Secondary | ICD-10-CM | POA: Diagnosis not present

## 2022-09-29 DIAGNOSIS — M79604 Pain in right leg: Secondary | ICD-10-CM | POA: Diagnosis not present

## 2022-09-29 DIAGNOSIS — I83893 Varicose veins of bilateral lower extremities with other complications: Secondary | ICD-10-CM | POA: Diagnosis not present

## 2022-09-29 DIAGNOSIS — R252 Cramp and spasm: Secondary | ICD-10-CM | POA: Diagnosis not present

## 2022-10-04 ENCOUNTER — Encounter: Payer: Self-pay | Admitting: Family Medicine

## 2022-10-14 ENCOUNTER — Other Ambulatory Visit (HOSPITAL_BASED_OUTPATIENT_CLINIC_OR_DEPARTMENT_OTHER): Payer: Self-pay

## 2022-10-14 DIAGNOSIS — M5416 Radiculopathy, lumbar region: Secondary | ICD-10-CM | POA: Diagnosis not present

## 2022-10-14 DIAGNOSIS — M48062 Spinal stenosis, lumbar region with neurogenic claudication: Secondary | ICD-10-CM | POA: Diagnosis not present

## 2022-10-15 ENCOUNTER — Other Ambulatory Visit (HOSPITAL_BASED_OUTPATIENT_CLINIC_OR_DEPARTMENT_OTHER): Payer: Self-pay

## 2022-10-18 DIAGNOSIS — M25511 Pain in right shoulder: Secondary | ICD-10-CM | POA: Diagnosis not present

## 2022-10-18 DIAGNOSIS — M25512 Pain in left shoulder: Secondary | ICD-10-CM | POA: Diagnosis not present

## 2022-10-20 ENCOUNTER — Telehealth: Payer: Self-pay | Admitting: Family Medicine

## 2022-10-20 NOTE — Telephone Encounter (Signed)
I spoke with the patient and she reported she spoke with triage nurse and was advised to take insulin shots if her Blood sugar continue to be elevated above 120. Patient reported she was also advised this per her Bariatric provider as well. Patient reported she is currently feeling better and will contact the office if blood sugar continues to be elevated. Patient will continue to monitor blood sugars closely at home.

## 2022-10-20 NOTE — Telephone Encounter (Signed)
FYI: This call has been transferred to Access Nurse. Once the result note has been entered staff can address the message at that time.  Patient called in with the following symptoms:  Red Word:BS elevated 178 highest and 140 lowest request to speak with nurse   Please advise at Mobile [phone] (mobile)  Message is routed to Provider Pool and [facility] Triage

## 2022-10-25 ENCOUNTER — Other Ambulatory Visit: Payer: Self-pay | Admitting: Family Medicine

## 2022-10-25 ENCOUNTER — Encounter: Payer: Self-pay | Admitting: Family Medicine

## 2022-10-25 MED ORDER — BD PEN NEEDLE NANO 2ND GEN 32G X 4 MM MISC: 0 refills | Status: DC

## 2022-10-25 MED ORDER — BLOOD GLUCOSE METER KIT: PACK | 0 refills | Status: DC

## 2022-10-25 NOTE — Telephone Encounter (Signed)
Left a detailed message on the patient mobile phone instructing the patient to proceed to nearest UC or ED for elevated blood sugar. I also informed patient to contact the office to schedule an appointment for follow up in regards to blood sugar. RX sent for Pen needles as requested

## 2022-10-25 NOTE — Telephone Encounter (Signed)
Noted  

## 2022-10-25 NOTE — Telephone Encounter (Signed)
Patient also requesting the needles

## 2022-10-27 ENCOUNTER — Other Ambulatory Visit: Payer: Self-pay | Admitting: Adult Health

## 2022-10-27 ENCOUNTER — Other Ambulatory Visit (HOSPITAL_BASED_OUTPATIENT_CLINIC_OR_DEPARTMENT_OTHER): Payer: Self-pay

## 2022-10-27 ENCOUNTER — Telehealth: Payer: Self-pay | Admitting: Plastic Surgery

## 2022-10-27 NOTE — Telephone Encounter (Signed)
Notified pt of insurance denial for panniculectomy surgery.  Patient plans to appeal to her insurance.

## 2022-10-28 ENCOUNTER — Other Ambulatory Visit (HOSPITAL_BASED_OUTPATIENT_CLINIC_OR_DEPARTMENT_OTHER): Payer: Self-pay

## 2022-10-28 MED ORDER — AMPHETAMINE-DEXTROAMPHETAMINE 30 MG PO TABS
30.0000 mg | ORAL_TABLET | Freq: Two times a day (BID) | ORAL | 0 refills | Status: DC
  Filled 2022-10-28: qty 60, 30d supply, fill #0

## 2022-10-28 NOTE — Telephone Encounter (Signed)
Ok to fill?  °Please advise °

## 2022-11-01 DIAGNOSIS — M5117 Intervertebral disc disorders with radiculopathy, lumbosacral region: Secondary | ICD-10-CM | POA: Diagnosis not present

## 2022-11-01 DIAGNOSIS — M5416 Radiculopathy, lumbar region: Secondary | ICD-10-CM | POA: Diagnosis not present

## 2022-11-07 ENCOUNTER — Other Ambulatory Visit: Payer: Self-pay

## 2022-11-07 MED ORDER — ACCU-CHEK SOFTCLIX LANCETS MISC: 3 refills | Status: DC

## 2022-11-16 ENCOUNTER — Other Ambulatory Visit (HOSPITAL_BASED_OUTPATIENT_CLINIC_OR_DEPARTMENT_OTHER): Payer: Self-pay

## 2022-11-16 DIAGNOSIS — M48062 Spinal stenosis, lumbar region with neurogenic claudication: Secondary | ICD-10-CM | POA: Diagnosis not present

## 2022-11-16 MED ORDER — OXYCODONE-ACETAMINOPHEN 10-325 MG PO TABS
1.0000 | ORAL_TABLET | Freq: Three times a day (TID) | ORAL | 0 refills | Status: DC | PRN
  Filled 2022-11-16: qty 90, 30d supply, fill #0

## 2022-11-16 MED ORDER — OXYCODONE-ACETAMINOPHEN 10-325 MG PO TABS
1.0000 | ORAL_TABLET | Freq: Three times a day (TID) | ORAL | 0 refills | Status: DC | PRN
  Filled 2022-12-19: qty 90, 30d supply, fill #0

## 2022-11-16 MED ORDER — OXYCODONE-ACETAMINOPHEN 10-325 MG PO TABS
1.0000 | ORAL_TABLET | Freq: Three times a day (TID) | ORAL | 0 refills | Status: DC | PRN
  Filled 2022-12-19 – 2023-01-16 (×2): qty 90, 30d supply, fill #0

## 2022-11-22 ENCOUNTER — Other Ambulatory Visit: Payer: Self-pay | Admitting: Adult Health

## 2022-11-23 ENCOUNTER — Other Ambulatory Visit (HOSPITAL_BASED_OUTPATIENT_CLINIC_OR_DEPARTMENT_OTHER): Payer: Self-pay

## 2022-11-23 MED ORDER — AMPHETAMINE-DEXTROAMPHETAMINE 30 MG PO TABS
30.0000 mg | ORAL_TABLET | Freq: Two times a day (BID) | ORAL | 0 refills | Status: DC
  Filled 2022-11-23 – 2022-11-25 (×2): qty 60, 30d supply, fill #0

## 2022-11-23 NOTE — Telephone Encounter (Signed)
Pt LOV 09/14/22 Last refill was done on 10/28/2022 Please advise

## 2022-11-25 ENCOUNTER — Other Ambulatory Visit: Payer: Self-pay

## 2022-12-11 ENCOUNTER — Other Ambulatory Visit: Payer: Self-pay | Admitting: Family Medicine

## 2022-12-12 ENCOUNTER — Other Ambulatory Visit: Payer: Self-pay | Admitting: Family Medicine

## 2022-12-19 ENCOUNTER — Other Ambulatory Visit (HOSPITAL_BASED_OUTPATIENT_CLINIC_OR_DEPARTMENT_OTHER): Payer: Self-pay

## 2022-12-22 ENCOUNTER — Other Ambulatory Visit: Payer: Self-pay | Admitting: Adult Health

## 2022-12-23 ENCOUNTER — Other Ambulatory Visit (HOSPITAL_BASED_OUTPATIENT_CLINIC_OR_DEPARTMENT_OTHER): Payer: Self-pay

## 2022-12-23 NOTE — Telephone Encounter (Signed)
Okay for refill?  

## 2022-12-25 ENCOUNTER — Other Ambulatory Visit (HOSPITAL_BASED_OUTPATIENT_CLINIC_OR_DEPARTMENT_OTHER): Payer: Self-pay

## 2022-12-25 MED ORDER — AMPHETAMINE-DEXTROAMPHETAMINE 30 MG PO TABS
30.0000 mg | ORAL_TABLET | Freq: Two times a day (BID) | ORAL | 0 refills | Status: DC
  Filled 2022-12-25: qty 60, 30d supply, fill #0

## 2022-12-26 ENCOUNTER — Other Ambulatory Visit (HOSPITAL_COMMUNITY): Payer: Self-pay

## 2022-12-27 ENCOUNTER — Ambulatory Visit (INDEPENDENT_AMBULATORY_CARE_PROVIDER_SITE_OTHER): Payer: Medicare Other | Admitting: Family Medicine

## 2022-12-27 ENCOUNTER — Ambulatory Visit: Payer: Medicare Other | Admitting: Family Medicine

## 2022-12-27 ENCOUNTER — Other Ambulatory Visit (HOSPITAL_BASED_OUTPATIENT_CLINIC_OR_DEPARTMENT_OTHER): Payer: Self-pay

## 2022-12-27 ENCOUNTER — Encounter: Payer: Self-pay | Admitting: Family Medicine

## 2022-12-27 VITALS — BP 122/68 | HR 105 | Temp 98.3°F | Ht 64.0 in | Wt 193.6 lb

## 2022-12-27 DIAGNOSIS — J069 Acute upper respiratory infection, unspecified: Secondary | ICD-10-CM

## 2022-12-27 MED ORDER — HYDROCODONE BIT-HOMATROP MBR 5-1.5 MG/5ML PO SOLN
5.0000 mL | Freq: Three times a day (TID) | ORAL | 0 refills | Status: DC | PRN
  Filled 2022-12-27: qty 120, 8d supply, fill #0

## 2022-12-27 NOTE — Progress Notes (Signed)
Established Patient Office Visit  Subjective   Patient ID: [name] Sherman, female    DOB: [birth year]  Age: 62 y.o. MRN: [id number]  Chief Complaint  Patient presents with   Emesis   Nasal Congestion    Patient complains of nasal congestion, x4 days, Tried Mucinex and Sudafed   Sore Throat    Patient complains of sore throat, x4 days, Tried Mucinex and Sudafed   Cough    Patient complains of cough, x4 days, Tried Mucinex and Sudafed    HPI   [name] is seen with about 4-day history of some fatigue, cough, right earache, nasal congestion, sore throat.  Cough is mostly nonproductive.  She denies any fever.  Home COVID test negative.  Husband has had almost identical symptoms and his symptoms started first.  She denies any dyspnea.  Cough has been severe at times and interfering greatly with sleep.  Not relieved with over-the-counter medications.  She is requesting cough medication.  Past Medical History:  Diagnosis Date   ADD (attention deficit disorder)    Anemia    Anxiety    Arthritis    shoulder, right (arthritis, tendonitis, bursitis)   Chronic low back pain    Complication of anesthesia    Dyspnea    Fatty liver    GERD (gastroesophageal reflux disease)    History of cervical cancer    s/p  laser ablation of cervix 1980's   History of COVID-19 07/2019   History of kidney stones    Hyperlipidemia    Hypertension    Hypothyroidism    IBS (irritable bowel syndrome)    MDD (major depressive disorder)    Migraine    OSA (obstructive sleep apnea)    pt used cpap up until 2013 states lost wt and did not need anymore   PONV (postoperative nausea and vomiting)    Restless legs    Sleep apnea    wears CPAP   Type 2 diabetes mellitus (HCC)    Urgency of urination    Vertigo    Vitamin D deficiency    Vulvar cyst    Past Surgical History:  Procedure Laterality Date   BUNIONECTOMY Right 2004   COLONOSCOPY     CYSTOSCOPY W/ RETROGRADES Bilateral 05/26/2015    Procedure: CYSTOSCOPY WITH RETROGRADE PYELOGRAM;  Surgeon: Jerilee Field, MD;  Location: [facility];  Service: Urology;  Laterality: Bilateral;   CYSTOSCOPY W/ URETERAL STENT REMOVAL Left 05/26/2015   Procedure: CYSTOSCOPY WITH STENT REMOVAL;  Surgeon: Jerilee Field, MD;  Location: [facility];  Service: Urology;  Laterality: Left;   CYSTOSCOPY WITH RETROGRADE PYELOGRAM, URETEROSCOPY AND STENT PLACEMENT Left 05/01/2015   Procedure: CYSTOSCOPY WITH RETROGRADE PYELOGRAM AND STENT PLACEMENT;  Surgeon: Jerilee Field, MD;  Location: [facility];  Service: Urology;  Laterality: Left;   CYSTOSCOPY WITH STENT PLACEMENT Bilateral 05/26/2015   Procedure: CYSTOSCOPY WITH STENT PLACEMENT;  Surgeon: Jerilee Field, MD;  Location[facility];  Service: Urology;  Laterality: Bilateral;   CYSTOSCOPY WITH URETEROSCOPY Bilateral 05/26/2015   Procedure: CYSTOSCOPY WITH URETEROSCOPY;  Surgeon: Jerilee Field, MD;  Location: [facility];  Service: Urology;  Laterality: Bilateral;   CYSTOSCOPY/URETEROSCOPY/HOLMIUM LASER/STENT PLACEMENT Right 06/09/2015   Procedure: CYSTOSCOPY/URETEROSCOPY/STENT PLACEMENT REMOVAL LEFT URETERAL STENT;  Surgeon: Jerilee Field, MD;  Location: [facility];  Service: Urology;  Laterality: Right;   DIAGNOSTIC LAPAROSCOPY     HOLMIUM LASER APPLICATION Left 05/26/2015   Procedure: HOLMIUM LASER APPLICATION;  Surgeon: Jerilee Field, MD;  Location: [facility];  Service: Urology;  Laterality: Left;   HOLMIUM LASER APPLICATION Right 06/09/2015   Procedure: HOLMIUM LASER APPLICATION;  Surgeon: Jerilee Field, MD;  Location: [facility];  Service: Urology;  Laterality: Right;   INSERTION OF MESH N/A 09/05/2016   Procedure: INSERTION OF MESH;  Surgeon: Berna Bue, MD;  Location: [facility];  Service: General;  Laterality: N/A;   LAPAROSCOPIC ASSISTED VAGINAL HYSTERECTOMY  04/23/2002   @[facility]   by Dr Jackelyn Knife;  With Left  Salpingoophorectomy/  Anterior Repair/  Tension Free Tape Sling placement   LAPAROSCOPIC GASTRIC SLEEVE RESECTION N/A 09/07/2021   Procedure: LAPAROSCOPIC GASTRIC SLEEVE RESECTION;  Surgeon: Berna Bue, MD;  Location: [facility];  Service: General;  Laterality: N/A;   LASER ABLATION OF THE CERVIX  x2  1980's   POSTERIOR LAMINECTOMY / DECOMPRESSION LUMBAR SPINE  02/03/2020   @[facility]   Dr Danielle Dess;   L4--5 and fusion   POSTERIOR LUMBAR FUSION  01/21/2019   @[facility]  by Dr Danielle Dess;   L5--S1   TUBAL LIGATION  1980's   UPPER GI ENDOSCOPY N/A 09/07/2021   Procedure: UPPER GI ENDOSCOPY;  Surgeon: Berna Bue, MD;  Location: [facility];  Service: General;  Laterality: N/A;   VENTRAL HERNIA REPAIR N/A 09/05/2016   Procedure: LAPAROSCOPIC VENTRAL HERNIA;  Surgeon: Berna Bue, MD;  Location: [facility];  Service: General;  Laterality: N/A;    reports that she has never smoked. She has never used smokeless tobacco. She reports current alcohol use. She reports that she does not use drugs. family history includes Alcohol abuse in her mother; Arthritis in her father; COPD in her father; Cancer in her maternal grandmother and paternal grandmother; Cirrhosis in her sister; Colon cancer in her maternal grandmother and mother; Heart disease in her father; Hyperlipidemia in her father; Hypertension in her father; Kidney cancer in her sister; Kidney disease in her paternal uncle; Lung cancer in her mother; Rectal cancer in her maternal grandmother. Allergies  Allergen Reactions   Keflex [Cephalexin] Swelling and Other (See Comments)    Tongue swelling    Review of Systems  Constitutional:  Positive for malaise/fatigue. Negative for chills and fever.  HENT:  Positive for congestion and sore throat.   Respiratory:  Positive for cough.   Cardiovascular:  Negative for chest pain.      Objective:     BP 122/68 (BP Location: Left Arm, Patient Position: Sitting, Cuff Size: Normal)   Pulse (!) 105   Temp 98.3 F (36.8  C) (Oral)   Ht 5\' 4"  (1.626 m)   Wt 193 lb 9.6 oz (87.8 kg)   SpO2 98%   BMI 33.23 kg/m  BP Readings from Last 3 Encounters:  12/27/22 122/68  09/14/22 118/80  08/10/22 116/66   Wt Readings from Last 3 Encounters:  12/27/22 193 lb 9.6 oz (87.8 kg)  09/21/22 192 lb (87.1 kg)  09/14/22 193 lb (87.5 kg)      Physical Exam Vitals reviewed.  Constitutional:      General: She is not in acute distress.    Appearance: She is well-developed. She is not toxic-appearing.  HENT:     Right Ear: Tympanic membrane normal.     Left Ear: Tympanic membrane normal.     Mouth/Throat:     Mouth: Mucous membranes are moist.     Pharynx: Oropharynx is clear. No oropharyngeal exudate or posterior oropharyngeal erythema.  Cardiovascular:     Rate and Rhythm: Normal rate and regular rhythm.  Pulmonary:     Effort: Pulmonary effort is normal.     Breath sounds: Normal breath sounds. No wheezing or rales.  Musculoskeletal:     Cervical back: Neck supple.  Lymphadenopathy:     Cervical: No cervical adenopathy.  Neurological:     Mental Status: She is alert.      No results found for any visits on 12/27/22.    The 10-year ASCVD risk score (Arnett DK, et al., 2019) is: 7.2%    Assessment & Plan:   Acute viral upper respiratory infection with cough.  She relates 4-day history of symptoms as above.  Nonfocal exam.  No respiratory distress.  Husband has very similar symptoms.  She has severe cough at times. Will send in Hycodan cough syrup 1 teaspoon every 6 hours as needed.  She knows not to mix this with oxycodone or other opioids.  She is aware of potential sedation. -Follow-up immediately for any fever, increased shortness of breath, or other concerns  Evelena Peat, MD

## 2023-01-13 ENCOUNTER — Other Ambulatory Visit (HOSPITAL_BASED_OUTPATIENT_CLINIC_OR_DEPARTMENT_OTHER): Payer: Self-pay

## 2023-01-16 ENCOUNTER — Other Ambulatory Visit: Payer: Self-pay

## 2023-01-16 ENCOUNTER — Other Ambulatory Visit (HOSPITAL_BASED_OUTPATIENT_CLINIC_OR_DEPARTMENT_OTHER): Payer: Self-pay

## 2023-01-17 DIAGNOSIS — M25512 Pain in left shoulder: Secondary | ICD-10-CM | POA: Diagnosis not present

## 2023-01-17 DIAGNOSIS — M25511 Pain in right shoulder: Secondary | ICD-10-CM | POA: Diagnosis not present

## 2023-01-27 ENCOUNTER — Other Ambulatory Visit: Payer: Self-pay | Admitting: Family Medicine

## 2023-01-27 ENCOUNTER — Other Ambulatory Visit (HOSPITAL_BASED_OUTPATIENT_CLINIC_OR_DEPARTMENT_OTHER): Payer: Self-pay

## 2023-01-27 MED ORDER — AMPHETAMINE-DEXTROAMPHETAMINE 30 MG PO TABS
30.0000 mg | ORAL_TABLET | Freq: Two times a day (BID) | ORAL | 0 refills | Status: DC
  Filled 2023-01-27 – 2023-01-30 (×3): qty 60, 30d supply, fill #0

## 2023-01-27 NOTE — Telephone Encounter (Signed)
Pt is back in school and need medication today

## 2023-01-28 ENCOUNTER — Other Ambulatory Visit (HOSPITAL_BASED_OUTPATIENT_CLINIC_OR_DEPARTMENT_OTHER): Payer: Self-pay

## 2023-01-30 ENCOUNTER — Other Ambulatory Visit (HOSPITAL_BASED_OUTPATIENT_CLINIC_OR_DEPARTMENT_OTHER): Payer: Self-pay

## 2023-01-30 ENCOUNTER — Other Ambulatory Visit: Payer: Self-pay

## 2023-02-07 DIAGNOSIS — M5416 Radiculopathy, lumbar region: Secondary | ICD-10-CM | POA: Diagnosis not present

## 2023-02-09 ENCOUNTER — Other Ambulatory Visit: Payer: Self-pay | Admitting: Family Medicine

## 2023-02-16 ENCOUNTER — Other Ambulatory Visit (HOSPITAL_BASED_OUTPATIENT_CLINIC_OR_DEPARTMENT_OTHER): Payer: Self-pay

## 2023-02-17 ENCOUNTER — Other Ambulatory Visit (HOSPITAL_BASED_OUTPATIENT_CLINIC_OR_DEPARTMENT_OTHER): Payer: Self-pay

## 2023-02-17 MED ORDER — OXYCODONE-ACETAMINOPHEN 10-325 MG PO TABS
1.0000 | ORAL_TABLET | Freq: Three times a day (TID) | ORAL | 0 refills | Status: DC | PRN
  Filled 2023-02-17: qty 90, 30d supply, fill #0

## 2023-02-28 ENCOUNTER — Other Ambulatory Visit (HOSPITAL_BASED_OUTPATIENT_CLINIC_OR_DEPARTMENT_OTHER): Payer: Self-pay

## 2023-02-28 ENCOUNTER — Other Ambulatory Visit: Payer: Self-pay | Admitting: Family Medicine

## 2023-02-28 DIAGNOSIS — M961 Postlaminectomy syndrome, not elsewhere classified: Secondary | ICD-10-CM | POA: Diagnosis not present

## 2023-02-28 DIAGNOSIS — M5416 Radiculopathy, lumbar region: Secondary | ICD-10-CM | POA: Diagnosis not present

## 2023-02-28 MED ORDER — OXYCODONE-ACETAMINOPHEN 10-325 MG PO TABS
1.0000 | ORAL_TABLET | Freq: Three times a day (TID) | ORAL | 0 refills | Status: DC | PRN
  Filled 2023-03-31: qty 90, 30d supply, fill #0

## 2023-02-28 MED ORDER — OXYCODONE-ACETAMINOPHEN 10-325 MG PO TABS
1.0000 | ORAL_TABLET | Freq: Three times a day (TID) | ORAL | 0 refills | Status: DC | PRN
  Filled 2023-05-01: qty 90, 30d supply, fill #0

## 2023-03-01 ENCOUNTER — Other Ambulatory Visit (HOSPITAL_BASED_OUTPATIENT_CLINIC_OR_DEPARTMENT_OTHER): Payer: Self-pay

## 2023-03-01 NOTE — Telephone Encounter (Signed)
Pt is calling back about her refill.

## 2023-03-02 ENCOUNTER — Other Ambulatory Visit: Payer: Self-pay | Admitting: Family Medicine

## 2023-03-02 ENCOUNTER — Other Ambulatory Visit (HOSPITAL_BASED_OUTPATIENT_CLINIC_OR_DEPARTMENT_OTHER): Payer: Self-pay

## 2023-03-02 NOTE — Telephone Encounter (Signed)
Patient states she called in her refill on 02/24/23 on MyChart, on Tuesday, and then again yesterday.  Patient is now out of her Adderall, doesn't have any for Friday.    Pharmacy- [facility] on [address]

## 2023-03-03 ENCOUNTER — Other Ambulatory Visit (HOSPITAL_BASED_OUTPATIENT_CLINIC_OR_DEPARTMENT_OTHER): Payer: Self-pay

## 2023-03-03 MED ORDER — AMPHETAMINE-DEXTROAMPHETAMINE 30 MG PO TABS
30.0000 mg | ORAL_TABLET | Freq: Two times a day (BID) | ORAL | 0 refills | Status: DC
  Filled 2023-03-03: qty 60, 30d supply, fill #0

## 2023-03-03 NOTE — Telephone Encounter (Signed)
Noted  

## 2023-03-03 NOTE — Telephone Encounter (Signed)
Refilled Adderall.  Today was the first I had seen of this request.  Not sure why if she called on Tuesday this was just coming through today  Kristian Covey MD  Primary Care at [facility]

## 2023-03-06 ENCOUNTER — Emergency Department (HOSPITAL_COMMUNITY): Payer: Medicare Other

## 2023-03-06 ENCOUNTER — Other Ambulatory Visit: Payer: Self-pay

## 2023-03-06 ENCOUNTER — Emergency Department (HOSPITAL_COMMUNITY)
Admission: EM | Admit: 2023-03-06 | Discharge: 2023-03-07 | Disposition: A | Discharge status: Home / Self Care | Payer: Medicare Other | Attending: Emergency Medicine | Admitting: Emergency Medicine

## 2023-03-06 ENCOUNTER — Encounter (HOSPITAL_COMMUNITY): Payer: Self-pay

## 2023-03-06 DIAGNOSIS — K760 Fatty (change of) liver, not elsewhere classified: Secondary | ICD-10-CM | POA: Diagnosis not present

## 2023-03-06 DIAGNOSIS — N132 Hydronephrosis with renal and ureteral calculous obstruction: Secondary | ICD-10-CM | POA: Diagnosis not present

## 2023-03-06 DIAGNOSIS — N201 Calculus of ureter: Secondary | ICD-10-CM | POA: Insufficient documentation

## 2023-03-06 DIAGNOSIS — I10 Essential (primary) hypertension: Secondary | ICD-10-CM | POA: Insufficient documentation

## 2023-03-06 DIAGNOSIS — Z794 Long term (current) use of insulin: Secondary | ICD-10-CM | POA: Insufficient documentation

## 2023-03-06 DIAGNOSIS — D72829 Elevated white blood cell count, unspecified: Secondary | ICD-10-CM | POA: Insufficient documentation

## 2023-03-06 DIAGNOSIS — N23 Unspecified renal colic: Secondary | ICD-10-CM

## 2023-03-06 DIAGNOSIS — E119 Type 2 diabetes mellitus without complications: Secondary | ICD-10-CM | POA: Insufficient documentation

## 2023-03-06 DIAGNOSIS — R109 Unspecified abdominal pain: Secondary | ICD-10-CM | POA: Diagnosis not present

## 2023-03-06 DIAGNOSIS — R1011 Right upper quadrant pain: Secondary | ICD-10-CM | POA: Diagnosis not present

## 2023-03-06 LAB — URINALYSIS, ROUTINE W REFLEX MICROSCOPIC
Bacteria, UA: NONE SEEN
Bilirubin Urine: NEGATIVE
Glucose, UA: NEGATIVE mg/dL
Ketones, ur: 5 mg/dL — AB
Nitrite: NEGATIVE
Protein, ur: 30 mg/dL — AB
RBC / HPF: >50 RBC/hpf (ref 0–5)
Specific Gravity, Urine: 1.03 (ref 1.005–1.030)
pH: 5 (ref 5.0–8.0)

## 2023-03-06 LAB — COMPREHENSIVE METABOLIC PANEL
ALT: 26 U/L (ref 0–44)
AST: 20 U/L (ref 15–41)
Albumin: 3.5 g/dL (ref 3.5–5.0)
Alkaline Phosphatase: 97 U/L (ref 38–126)
Anion gap: 11 (ref 5–15)
BUN: 26 mg/dL — ABNORMAL HIGH (ref 6–20)
CO2: 23 mmol/L (ref 22–32)
Calcium: 9.3 mg/dL (ref 8.9–10.3)
Chloride: 106 mmol/L (ref 98–111)
Creatinine, Ser: 0.86 mg/dL (ref 0.44–1.00)
GFR, Estimated: >60 mL/min (ref >60)
Glucose, Bld: 155 mg/dL — ABNORMAL HIGH (ref 70–99)
Potassium: 3.8 mmol/L (ref 3.5–5.1)
Sodium: 140 mmol/L (ref 135–145)
Total Bilirubin: 0.6 mg/dL (ref 0.3–1.2)
Total Protein: 7.3 g/dL (ref 6.5–8.1)

## 2023-03-06 LAB — CBC
HCT: 41.2 % (ref 36.0–46.0)
Hemoglobin: 13.2 g/dL (ref 12.0–15.0)
MCH: 28 pg (ref 26.0–34.0)
MCHC: 32 g/dL (ref 30.0–36.0)
MCV: 87.3 fL (ref 80.0–100.0)
Platelets: 331 10*3/uL (ref 150–400)
RBC: 4.72 MIL/uL (ref 3.87–5.11)
RDW: 13.7 % (ref 11.5–15.5)
WBC: 11.1 10*3/uL — ABNORMAL HIGH (ref 4.0–10.5)
nRBC: 0 % (ref 0.0–0.2)

## 2023-03-06 LAB — LIPASE, BLOOD: Lipase: 48 U/L (ref 11–51)

## 2023-03-06 MED ORDER — IOHEXOL 300 MG/ML  SOLN
100.0000 mL | Freq: Once | INTRAMUSCULAR | Status: AC | PRN
  Administered 2023-03-06: 100 mL via INTRAVENOUS

## 2023-03-06 MED ORDER — ONDANSETRON 4 MG PO TBDP
4.0000 mg | ORAL_TABLET | Freq: Once | ORAL | Status: AC
  Administered 2023-03-06: 4 mg via ORAL
  Filled 2023-03-06: qty 1

## 2023-03-06 MED ORDER — OXYCODONE-ACETAMINOPHEN 5-325 MG PO TABS
1.0000 | ORAL_TABLET | Freq: Once | ORAL | Status: AC
  Administered 2023-03-06: 1 via ORAL
  Filled 2023-03-06: qty 1

## 2023-03-06 NOTE — ED Provider Triage Note (Signed)
Emergency Medicine Provider Triage Evaluation Note  [name] Sherman , a 62 y.o. female  was evaluated in triage.  Pt complains of right abd pain that began suddenly today. Pt still has GB and appendix. Endorses NV. States she feels bloated like she has to have BM but can't. Feels different than previous kidney stones.  Denies CP SHOB, dysuria, changes in sensation, weakness, leg edema  Review of Systems  Positive: See HPI Negative: See HPI  Physical Exam  BP (!) 161/78   Pulse 88   Temp 97.7 F (36.5 C) (Oral)   Resp 19   Ht 5\' 4"  (1.626 m)   Wt 90.7 kg   SpO2 97%   BMI 34.33 kg/m  Gen:   Awake, distress Resp:  Normal effort  MSK:   Moves extremities without difficulty  Other:  RLQ/RUQ tenderness without peritoneal signs, no CVA tenderness  Medical Decision Making  Medically screening exam initiated at 6:40 PM.  Appropriate orders placed.  [name] Sherman was informed that the remainder of the evaluation will be completed by another provider, this initial triage assessment does not replace that evaluation, and the importance of remaining in the ED until their evaluation is complete.  Workup started, patient stable   Netta Corrigan, [place] 03/06/23 1847

## 2023-03-06 NOTE — ED Triage Notes (Addendum)
Patient has right sided abdominal pain that moves to her right flank. Denies burning urination. No blood in urine. Feels nauseous and like her abdomen is swelling.

## 2023-03-07 MED ORDER — ONDANSETRON 4 MG PO TBDP: 4.0000 mg | ORAL_TABLET | Freq: Three times a day (TID) | ORAL | 0 refills | Status: DC | PRN

## 2023-03-07 MED ORDER — KETOROLAC TROMETHAMINE 15 MG/ML IJ SOLN
15.0000 mg | Freq: Once | INTRAMUSCULAR | Status: AC
  Administered 2023-03-07: 15 mg via INTRAVENOUS
  Filled 2023-03-07: qty 1

## 2023-03-07 MED ORDER — TAMSULOSIN HCL 0.4 MG PO CAPS: 0.4000 mg | ORAL_CAPSULE | Freq: Every day | ORAL | 0 refills | Status: DC

## 2023-03-07 NOTE — Discharge Instructions (Addendum)
You have a 3 mm stone in your right ureter.  Drink plenty of fluids.  Get rechecked if you have fever, cannot urinate, or have uncontrolled pain.

## 2023-03-07 NOTE — ED Provider Notes (Signed)
[place] EMERGENCY DEPARTMENT AT [facility] Provider Note   CSN: [id number] Arrival date & time: 03/06/23  1654     History  Chief Complaint  Patient presents with   Abdominal Pain    [name] Sherman is a 62 y.o. female.  The history is provided by the patient.  Abdominal Pain [name] Sherman is a 62 y.o. female who presents to the Emergency Department complaining of abdominal pain.  She presents to the emergency department for evaluation of right upper quadrant abdominal pain that radiates to her back that started 415 this afternoon.  Pain is described as sharp.  Has associated nausea.  She also reports some hematuria that started at time of ED evaluation.  No dysuria, fever.  She received oxycodone earlier in her ED stay, which did help her pain.  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  Home Medications Prior to Admission medications   Medication Sig Start Date End Date Taking? Authorizing Provider  ondansetron (ZOFRAN-ODT) 4 MG disintegrating tablet Take 1 tablet (4 mg total) by mouth every 8 (eight) hours as needed. 03/07/23  Yes Tilden Fossa, MD  tamsulosin (FLOMAX) 0.4 MG CAPS capsule Take 1 capsule (0.4 mg total) by mouth daily. 03/07/23  Yes Tilden Fossa, MD  Accu-Chek Softclix Lancets lancets USE AS DIRECTED UP TO FOUR TIMES DAILY 11/07/22   Burchette, Elberta Fortis, MD  amphetamine-dextroamphetamine (ADDERALL) 30 MG tablet Take 1 tablet by mouth 2 (two) times daily. 03/03/23   Burchette, Elberta Fortis, MD  atorvastatin (LIPITOR) 10 MG tablet TAKE 1 TABLET(10 MG) BY MOUTH DAILY 12/12/22   Burchette, Elberta Fortis, MD  blood glucose meter kit and supplies Dispense based on patient and insurance preference. Use up to four times daily as directed. (FOR ICD-10 E10.9, E11.9). 10/25/22   Burchette, Elberta Fortis, MD  CALCIUM CITRATE PO Take 3 tablets by mouth 2 (two) times daily.    [provider]  cetirizine (ZYRTEC) 10 MG  tablet Take 10 mg by mouth in the morning.    [provider]  citalopram (CELEXA) 20 MG tablet TAKE 1 TABLET(20 MG) BY MOUTH DAILY 02/09/23   Burchette, Elberta Fortis, MD  furosemide (LASIX) 20 MG tablet TAKE 1 TABLET BY MOUTH ONCE A DAY AS NEEDED FOR SWELLING 12/07/20   Burchette, Elberta Fortis, MD  glucose blood (ACCU-CHEK GUIDE) test strip Use to test blood sugars 3 times a day. 08/27/21   Burchette, Elberta Fortis, MD  HYDROcodone bit-homatropine (HYCODAN) 5-1.5 MG/5ML syrup Take 5 mLs by mouth every 8 (eight) hours as needed for cough. 12/27/22   Burchette, Elberta Fortis, MD  Insulin Pen Needle (BD PEN NEEDLE NANO 2ND GEN) 32G X 4 MM MISC USE WITH INSULIN PEN TO INJECT INSULIN 10/26/22   Worthy Rancher B, FNP  Insulin Pen Needle (BD PEN NEEDLE NANO 2ND GEN) 32G X 4 MM MISC Use as directed 10/25/22   Kristian Covey, MD  levothyroxine (SYNTHROID) 50 MCG tablet TAKE 1 TABLET(50 MCG) BY MOUTH DAILY 02/09/23   Burchette, Elberta Fortis, MD  losartan (COZAAR) 50 MG tablet TAKE 1 TABLET(50 MG) BY MOUTH DAILY 02/09/23   Burchette, Elberta Fortis, MD  mupirocin ointment (BACTROBAN) 2 % Apply 1 Application topically 2 (two) times daily. 08/10/22   Burchette, Elberta Fortis, MD  NON FORMULARY Take 1 tablet by mouth See admin instructions. unnamed Bariatric vitamin- Take 1 tablet by mouth once a day    [provider]  nystatin  cream (MYCOSTATIN) Apply 1 Application topically 2 (two) times daily. 02/09/22   Burchette, Elberta Fortis, MD  oxyCODONE-acetaminophen (PERCOCET) 10-325 MG tablet Take 1 tablet by mouth every 8 (eight) hours as needed. 09/15/22     oxyCODONE-acetaminophen (PERCOCET) 10-325 MG tablet Take 1 tablet by mouth every 8 (eight) hours as needed. 11/16/22     oxyCODONE-acetaminophen (PERCOCET) 10-325 MG tablet Take 1 tablet by mouth every 8 (eight) hours as needed. 11/16/22     oxyCODONE-acetaminophen (PERCOCET) 10-325 MG tablet take 1 tablet by oral route  every 8 hours as needed 02/17/23     oxyCODONE-acetaminophen (PERCOCET) 10-325 MG  tablet Take 1 tablet by mouth every 8 (eight) hours as needed. 03/29/23     oxyCODONE-acetaminophen (PERCOCET) 10-325 MG tablet Take 1 tablet by mouth every 8 (eight) hours as needed. 04/28/23     SUMAtriptan (IMITREX) 100 MG tablet May repeat in 2 hours if headache persists or recurs.  Do not take more than 2 in 24 hours 07/29/22   Burchette, Elberta Fortis, MD  pantoprazole (PROTONIX) 40 MG tablet TAKE 1 TABLET(40 MG) BY MOUTH TWICE DAILY 02/10/20   Armbruster, Willaim Rayas, MD      Allergies    Keflex [cephalexin]    Review of Systems   Review of Systems  Gastrointestinal:  Positive for abdominal pain.  All other systems reviewed and are negative.   Physical Exam Updated Vital Signs BP 127/79   Pulse 72   Temp 98.2 F (36.8 C) (Oral)   Resp 18   Ht 5\' 4"  (1.626 m)   Wt 90.7 kg   SpO2 96%   BMI 34.33 kg/m  Physical Exam Vitals and nursing note reviewed.  Constitutional:      Appearance: She is well-developed.  HENT:     Head: Normocephalic and atraumatic.  Cardiovascular:     Rate and Rhythm: Normal rate and regular rhythm.  Pulmonary:     Effort: Pulmonary effort is normal. No respiratory distress.  Abdominal:     Palpations: Abdomen is soft.     Tenderness: There is no guarding or rebound.     Comments: Tenderness to palpation over the right upper quadrant/CVA region  Musculoskeletal:        General: No tenderness.  Skin:    General: Skin is warm and dry.  Neurological:     Mental Status: She is alert and oriented to person, place, and time.  Psychiatric:        Behavior: Behavior normal.     ED Results / Procedures / Treatments   Labs (all labs ordered are listed, but only abnormal results are displayed) Labs Reviewed  COMPREHENSIVE METABOLIC PANEL - Abnormal; Notable for the following components:      Result Value   Glucose, Bld 155 (*)    BUN 26 (*)    All other components within normal limits  CBC - Abnormal; Notable for the following components:   WBC 11.1 (*)     All other components within normal limits  URINALYSIS, ROUTINE W REFLEX MICROSCOPIC - Abnormal; Notable for the following components:   APPearance HAZY (*)    Hgb urine dipstick LARGE (*)    Ketones, ur 5 (*)    Protein, ur 30 (*)    Leukocytes,Ua SMALL (*)    All other components within normal limits  LIPASE, BLOOD    EKG None  Radiology CT ABDOMEN PELVIS W CONTRAST  Result Date: 03/06/2023 CLINICAL DATA:  Right-sided abdominal pain. Gallbladder and appendix remain  EXAM: CT ABDOMEN AND PELVIS WITH CONTRAST TECHNIQUE: Multidetector CT imaging of the abdomen and pelvis was performed using the standard protocol following bolus administration of intravenous contrast. RADIATION DOSE REDUCTION: This exam was performed according to the departmental dose-optimization program which includes automated exposure control, adjustment of the mA and/or kV according to patient size and/or use of iterative reconstruction technique. CONTRAST:  OMNIPAQUE IOHEXOL 300 MG/ML  SOLN COMPARISON:  Abdominal ultrasound from earlier today FINDINGS: Lower chest:  No contributory findings. Hepatobiliary: No focal liver abnormality.No evidence of biliary obstruction or stone. Pancreas: Unremarkable. Spleen: Unremarkable. Adrenals/Urinary Tract: Negative adrenals. Right hydronephrosis and delayed renal excretion due to a 3 mm stone just below the UPJ. A similar size calculus is seen at the interpolar left kidney. Unremarkable bladder. Stomach/Bowel: Postoperative stomach, likely gastric reduction surgery. Vascular/Lymphatic: No acute vascular abnormality. No mass or adenopathy. Reproductive:Hysterectomy. Other: No ascites or pneumoperitoneum. Musculoskeletal: No acute abnormalities. Generalized spine degeneration with scoliosis. L4-5 and L5-S1 PLIF with solid arthrodesis. IMPRESSION: Obstructing 3 mm stone just below the right UPJ. Electronically Signed   By: Tiburcio Pea M.D.   On: 03/06/2023 22:15   US Abdomen  Limited RUQ (LIVER/GB)  Result Date: 03/06/2023 CLINICAL DATA:  Right upper quadrant abdominal pain. EXAM: ULTRASOUND ABDOMEN LIMITED RIGHT UPPER QUADRANT COMPARISON:  Ultrasound dated 03/24/2017 and CT abdomen pelvis dated 03/06/2023. FINDINGS: Gallbladder: No gallstones or wall thickening visualized. No sonographic Murphy sign noted by sonographer. Common bile duct: Diameter: 3 mm Liver: There is diffuse increased liver echogenicity most commonly seen in the setting of fatty infiltration. Superimposed inflammation or fibrosis is not excluded. Clinical correlation is recommended. Portal vein is patent on color Doppler imaging with normal direction of blood flow towards the liver. Other: None. IMPRESSION: Fatty liver, otherwise unremarkable right upper quadrant ultrasound. Electronically Signed   By: Elgie Collard M.D.   On: 03/06/2023 21:53    Procedures Procedures    Medications Ordered in ED Medications  ondansetron (ZOFRAN-ODT) disintegrating tablet 4 mg (4 mg Oral Given 03/06/23 1847)  oxyCODONE-acetaminophen (PERCOCET/ROXICET) 5-325 MG per tablet 1 tablet (1 tablet Oral Given 03/06/23 1847)  iohexol (OMNIPAQUE) 300 MG/ML solution 100 mL (100 mLs Intravenous Contrast Given 03/06/23 2027)  ketorolac (TORADOL) 15 MG/ML injection 15 mg (15 mg Intravenous Given 03/07/23 0102)    ED Course/ Medical Decision Making/ A&P                                 Medical Decision Making Amount and/or Complexity of Data Reviewed Labs: ordered.  Risk Prescription drug management.   Patient here for evaluation of right upper quadrant/flank pain that started earlier today.  She has tenderness on examination without peritoneal findings.  She does have a 3 mm right UPJ stone.  CBC with mild leukocytosis.  UA and history is not consistent with UTI.  Patient's pain is significantly improved in the emergency department with medications.  Discussed with patient home care for kidney stone as well as return  precautions for evidence of developing infection or new concerning symptoms.        Final Clinical Impression(s) / ED Diagnoses Final diagnoses:  Renal colic on right side    Rx / DC Orders ED Discharge Orders          Ordered    ondansetron (ZOFRAN-ODT) 4 MG disintegrating tablet  Every 8 hours PRN        03/07/23 0055  tamsulosin (FLOMAX) 0.4 MG CAPS capsule  Daily        03/07/23 0055              Tilden Fossa, MD 03/07/23 [id number]

## 2023-03-11 ENCOUNTER — Other Ambulatory Visit: Payer: Self-pay | Admitting: Family Medicine

## 2023-03-13 ENCOUNTER — Other Ambulatory Visit: Payer: Self-pay | Admitting: Family Medicine

## 2023-03-20 ENCOUNTER — Other Ambulatory Visit (HOSPITAL_BASED_OUTPATIENT_CLINIC_OR_DEPARTMENT_OTHER): Payer: Self-pay

## 2023-03-31 ENCOUNTER — Other Ambulatory Visit (HOSPITAL_BASED_OUTPATIENT_CLINIC_OR_DEPARTMENT_OTHER): Payer: Self-pay

## 2023-03-31 ENCOUNTER — Other Ambulatory Visit: Payer: Self-pay | Admitting: Family Medicine

## 2023-03-31 NOTE — Telephone Encounter (Signed)
Pt called to F/U on the status of this refill.  Please fill at your earliest convenience.

## 2023-03-31 NOTE — Telephone Encounter (Signed)
Pt called to F/U on this refill amphetamine-dextroamphetamine (ADDERALL) 30 MG tablet Pt is asking why does she have to call every month? Pt states MD used to send 90 day refills. Pt is asking if she can go back to 90 day refills, so that she does not have to call every month? Please advise.

## 2023-04-03 ENCOUNTER — Ambulatory Visit (INDEPENDENT_AMBULATORY_CARE_PROVIDER_SITE_OTHER): Payer: Medicare Other

## 2023-04-03 ENCOUNTER — Other Ambulatory Visit (HOSPITAL_BASED_OUTPATIENT_CLINIC_OR_DEPARTMENT_OTHER): Payer: Self-pay

## 2023-04-03 DIAGNOSIS — Z23 Encounter for immunization: Secondary | ICD-10-CM

## 2023-04-03 MED ORDER — AMPHETAMINE-DEXTROAMPHETAMINE 30 MG PO TABS
30.0000 mg | ORAL_TABLET | Freq: Two times a day (BID) | ORAL | 0 refills | Status: DC
  Filled 2023-04-03 – 2023-05-01 (×2): qty 60, 30d supply, fill #0

## 2023-04-03 MED ORDER — AMPHETAMINE-DEXTROAMPHETAMINE 30 MG PO TABS
30.0000 mg | ORAL_TABLET | Freq: Two times a day (BID) | ORAL | 0 refills | Status: DC
Start: 2023-04-03 — End: 2023-06-29
  Filled 2023-04-03 – 2023-05-29 (×2): qty 60, 30d supply, fill #0

## 2023-04-03 MED ORDER — AMPHETAMINE-DEXTROAMPHETAMINE 30 MG PO TABS
30.0000 mg | ORAL_TABLET | Freq: Two times a day (BID) | ORAL | 0 refills | Status: DC
  Filled 2023-04-03: qty 60, 30d supply, fill #0

## 2023-04-03 NOTE — Telephone Encounter (Signed)
Spoke to pt. Pt is aware Rx is sent.

## 2023-04-03 NOTE — Telephone Encounter (Signed)
Refill sent for 3 months  [name] Covey MD [facility]

## 2023-04-06 ENCOUNTER — Telehealth: Payer: Self-pay

## 2023-04-06 NOTE — Telephone Encounter (Signed)
Transition Care Management Unsuccessful Follow-up Telephone Call  Date of discharge and from where:  [name] Sherman 9/10  Attempts:  1st Attempt  Reason for unsuccessful TCM follow-up call:  No answer/busy   [name] Sherman [facility]  [facility], [facility] Guide, Phone: [phone] Website: [name]

## 2023-04-06 NOTE — Telephone Encounter (Signed)
Transition Care Management Unsuccessful Follow-up Telephone Call  Date of discharge and from where:  [name] Sherman [name] Sherman  Attempts:  2nd Attempt  Reason for unsuccessful TCM follow-up call:  No answer/busy   [name] Sherman [facility]  [facility], [facility] Guide, Phone: [phone] Website: [name]

## 2023-04-12 ENCOUNTER — Encounter (HOSPITAL_COMMUNITY): Payer: Self-pay | Admitting: *Deleted

## 2023-04-25 LAB — HM MAMMOGRAPHY

## 2023-05-01 ENCOUNTER — Other Ambulatory Visit (HOSPITAL_BASED_OUTPATIENT_CLINIC_OR_DEPARTMENT_OTHER): Payer: Self-pay

## 2023-05-01 ENCOUNTER — Other Ambulatory Visit (HOSPITAL_COMMUNITY): Payer: Self-pay

## 2023-05-03 ENCOUNTER — Encounter: Payer: Self-pay | Admitting: Family Medicine

## 2023-05-10 ENCOUNTER — Other Ambulatory Visit: Payer: Self-pay | Admitting: Family Medicine

## 2023-05-11 DIAGNOSIS — M5416 Radiculopathy, lumbar region: Secondary | ICD-10-CM | POA: Diagnosis not present

## 2023-05-29 ENCOUNTER — Other Ambulatory Visit: Payer: Self-pay

## 2023-05-29 ENCOUNTER — Other Ambulatory Visit (HOSPITAL_BASED_OUTPATIENT_CLINIC_OR_DEPARTMENT_OTHER): Payer: Self-pay

## 2023-06-08 ENCOUNTER — Other Ambulatory Visit: Payer: Self-pay | Admitting: Family Medicine

## 2023-06-13 ENCOUNTER — Other Ambulatory Visit (HOSPITAL_BASED_OUTPATIENT_CLINIC_OR_DEPARTMENT_OTHER): Payer: Self-pay

## 2023-06-15 ENCOUNTER — Other Ambulatory Visit (HOSPITAL_COMMUNITY): Payer: Self-pay

## 2023-06-15 ENCOUNTER — Other Ambulatory Visit: Payer: Self-pay

## 2023-06-15 ENCOUNTER — Other Ambulatory Visit (HOSPITAL_BASED_OUTPATIENT_CLINIC_OR_DEPARTMENT_OTHER): Payer: Self-pay

## 2023-06-15 MED ORDER — OXYCODONE-ACETAMINOPHEN 10-325 MG PO TABS
1.0000 | ORAL_TABLET | Freq: Three times a day (TID) | ORAL | 0 refills | Status: DC | PRN
  Filled 2023-06-15 (×2): qty 90, 30d supply, fill #0

## 2023-06-18 ENCOUNTER — Encounter: Payer: Self-pay | Admitting: Pharmacist

## 2023-06-18 NOTE — Progress Notes (Signed)
Pharmacy Quality Measure Review  This patient is appearing on a report for being at risk of failing the adherence measure for cholesterol (statin) medications this calendar year.   Medication: atorvastatin 10 mg Last fill date: 12/12 for 30 day supply   Insurance report was not up to date. No action needed at this time.   Jarrett Ables, PharmD PGY-1 Pharmacy Resident

## 2023-06-29 ENCOUNTER — Telehealth: Payer: Self-pay | Admitting: *Deleted

## 2023-06-29 ENCOUNTER — Other Ambulatory Visit: Payer: Self-pay | Admitting: Family Medicine

## 2023-06-29 ENCOUNTER — Other Ambulatory Visit (HOSPITAL_BASED_OUTPATIENT_CLINIC_OR_DEPARTMENT_OTHER): Payer: Self-pay

## 2023-06-29 ENCOUNTER — Other Ambulatory Visit (HOSPITAL_COMMUNITY): Payer: Self-pay

## 2023-06-29 ENCOUNTER — Encounter: Payer: Self-pay | Admitting: Family Medicine

## 2023-06-29 MED ORDER — AMPHETAMINE-DEXTROAMPHETAMINE 30 MG PO TABS: 30.0000 mg | ORAL_TABLET | Freq: Two times a day (BID) | ORAL | 0 refills | Status: DC

## 2023-06-29 NOTE — Telephone Encounter (Signed)
 Copied from CRM [id number]. Topic: Clinical - Medication Refill >> Jun 29, 2023 11:42 AM Nestora PARAS wrote: Most Recent Primary Care Visit:  Provider: CHRISTYNE AMBLE A  Department: [facility]  Visit Type: FLU SHOT  Date: 04/03/2023  Medication:  amphetamine -dextroamphetamine  (ADDERALL) 30 MG tablet  Has the patient contacted their pharmacy? Yes (Agent: If no, request that the patient contact the pharmacy for the refill. If patient does not wish to contact the pharmacy document the reason why and proceed with request.) (Agent: If yes, when and what did the pharmacy advise?) Contact PCP  Is this the correct pharmacy for this prescription? No If no, delete pharmacy and type the correct one.  This is the patient's preferred pharmacy:    [facility] [address] [place] [address] Phone: [phone] Fax: [phone]   Has the prescription been filled recently? No  Is the patient out of the medication? Yes  Has the patient been seen for an appointment in the last year OR does the patient have an upcoming appointment? Yes  Can we respond through MyChart? No  Agent: Please be advised that Rx refills may take up to 3 business days. We ask that you follow-up with your pharmacy.

## 2023-06-29 NOTE — Telephone Encounter (Signed)
 Refilled Adderall, but does need office follow up--- due for several follow up labs (TSH, lipids, A1C)  [name] Covey MD [facility]

## 2023-06-30 ENCOUNTER — Other Ambulatory Visit (HOSPITAL_BASED_OUTPATIENT_CLINIC_OR_DEPARTMENT_OTHER): Payer: Self-pay

## 2023-07-04 NOTE — Telephone Encounter (Signed)
 Rx sent 06/29/2023

## 2023-07-05 ENCOUNTER — Encounter: Payer: Medicare Other | Admitting: Family Medicine

## 2023-07-07 ENCOUNTER — Other Ambulatory Visit (HOSPITAL_BASED_OUTPATIENT_CLINIC_OR_DEPARTMENT_OTHER): Payer: Self-pay

## 2023-07-07 MED ORDER — OXYCODONE-ACETAMINOPHEN 10-325 MG PO TABS
1.0000 | ORAL_TABLET | Freq: Three times a day (TID) | ORAL | 0 refills | Status: DC | PRN
  Filled 2023-08-14: qty 90, 30d supply, fill #0

## 2023-07-07 MED ORDER — NORTRIPTYLINE HCL 50 MG PO CAPS
50.0000 mg | ORAL_CAPSULE | Freq: Two times a day (BID) | ORAL | 3 refills | Status: AC
  Filled 2023-07-07 – 2023-12-13 (×2): qty 180, 90d supply, fill #0

## 2023-07-07 MED ORDER — OXYCODONE-ACETAMINOPHEN 10-325 MG PO TABS: 1.0000 | ORAL_TABLET | Freq: Three times a day (TID) | ORAL | 0 refills | Status: DC | PRN

## 2023-07-07 MED ORDER — CYCLOBENZAPRINE HCL 10 MG PO TABS
10.0000 mg | ORAL_TABLET | Freq: Two times a day (BID) | ORAL | 11 refills | Status: DC
  Filled 2023-07-07: qty 60, 30d supply, fill #0

## 2023-07-07 MED ORDER — OXYCODONE-ACETAMINOPHEN 10-325 MG PO TABS
1.0000 | ORAL_TABLET | Freq: Three times a day (TID) | ORAL | 0 refills | Status: DC | PRN
  Filled 2023-07-15: qty 90, 30d supply, fill #0

## 2023-07-08 ENCOUNTER — Other Ambulatory Visit: Payer: Self-pay | Admitting: Family Medicine

## 2023-07-10 ENCOUNTER — Other Ambulatory Visit (HOSPITAL_BASED_OUTPATIENT_CLINIC_OR_DEPARTMENT_OTHER): Payer: Self-pay

## 2023-07-10 ENCOUNTER — Other Ambulatory Visit: Payer: Self-pay | Admitting: Family Medicine

## 2023-07-11 DIAGNOSIS — M25512 Pain in left shoulder: Secondary | ICD-10-CM | POA: Diagnosis not present

## 2023-07-11 DIAGNOSIS — M25551 Pain in right hip: Secondary | ICD-10-CM | POA: Diagnosis not present

## 2023-07-15 ENCOUNTER — Other Ambulatory Visit (HOSPITAL_BASED_OUTPATIENT_CLINIC_OR_DEPARTMENT_OTHER): Payer: Self-pay

## 2023-07-17 ENCOUNTER — Other Ambulatory Visit (HOSPITAL_BASED_OUTPATIENT_CLINIC_OR_DEPARTMENT_OTHER): Payer: Self-pay

## 2023-07-25 DIAGNOSIS — M25552 Pain in left hip: Secondary | ICD-10-CM | POA: Diagnosis not present

## 2023-07-25 DIAGNOSIS — M25511 Pain in right shoulder: Secondary | ICD-10-CM | POA: Diagnosis not present

## 2023-07-30 ENCOUNTER — Other Ambulatory Visit: Payer: Self-pay | Admitting: Family Medicine

## 2023-07-31 ENCOUNTER — Other Ambulatory Visit (HOSPITAL_BASED_OUTPATIENT_CLINIC_OR_DEPARTMENT_OTHER): Payer: Self-pay

## 2023-07-31 ENCOUNTER — Other Ambulatory Visit: Payer: Self-pay | Admitting: Family Medicine

## 2023-08-02 DIAGNOSIS — L2989 Other pruritus: Secondary | ICD-10-CM | POA: Diagnosis not present

## 2023-08-02 DIAGNOSIS — L820 Inflamed seborrheic keratosis: Secondary | ICD-10-CM | POA: Diagnosis not present

## 2023-08-02 DIAGNOSIS — L538 Other specified erythematous conditions: Secondary | ICD-10-CM | POA: Diagnosis not present

## 2023-08-02 DIAGNOSIS — L814 Other melanin hyperpigmentation: Secondary | ICD-10-CM | POA: Diagnosis not present

## 2023-08-02 DIAGNOSIS — R208 Other disturbances of skin sensation: Secondary | ICD-10-CM | POA: Diagnosis not present

## 2023-08-02 DIAGNOSIS — L821 Other seborrheic keratosis: Secondary | ICD-10-CM | POA: Diagnosis not present

## 2023-08-02 DIAGNOSIS — Z789 Other specified health status: Secondary | ICD-10-CM | POA: Diagnosis not present

## 2023-08-02 DIAGNOSIS — D225 Melanocytic nevi of trunk: Secondary | ICD-10-CM | POA: Diagnosis not present

## 2023-08-03 ENCOUNTER — Other Ambulatory Visit (HOSPITAL_BASED_OUTPATIENT_CLINIC_OR_DEPARTMENT_OTHER): Payer: Self-pay

## 2023-08-04 ENCOUNTER — Other Ambulatory Visit (HOSPITAL_BASED_OUTPATIENT_CLINIC_OR_DEPARTMENT_OTHER): Payer: Self-pay

## 2023-08-06 ENCOUNTER — Other Ambulatory Visit: Payer: Self-pay | Admitting: Family Medicine

## 2023-08-07 DIAGNOSIS — E119 Type 2 diabetes mellitus without complications: Secondary | ICD-10-CM | POA: Diagnosis not present

## 2023-08-07 DIAGNOSIS — H524 Presbyopia: Secondary | ICD-10-CM | POA: Diagnosis not present

## 2023-08-09 ENCOUNTER — Other Ambulatory Visit: Payer: Self-pay | Admitting: Family Medicine

## 2023-08-14 ENCOUNTER — Other Ambulatory Visit (HOSPITAL_BASED_OUTPATIENT_CLINIC_OR_DEPARTMENT_OTHER): Payer: Self-pay

## 2023-08-14 DIAGNOSIS — M5416 Radiculopathy, lumbar region: Secondary | ICD-10-CM | POA: Diagnosis not present

## 2023-08-22 ENCOUNTER — Telehealth: Payer: Self-pay | Admitting: Family Medicine

## 2023-08-22 NOTE — Telephone Encounter (Unsigned)
 Copied from CRM [id number]. Topic: General - Call Back - No Documentation >> Aug 22, 2023  9:50 AM Corin V wrote: Reason for CRM: Patient is returning a missed call from a Robin. No note found in chart. She is at work, so please leave a detailed note if she does not answer so an agent can assist with the call back.

## 2023-08-28 ENCOUNTER — Other Ambulatory Visit (HOSPITAL_BASED_OUTPATIENT_CLINIC_OR_DEPARTMENT_OTHER): Payer: Self-pay

## 2023-08-30 ENCOUNTER — Ambulatory Visit (INDEPENDENT_AMBULATORY_CARE_PROVIDER_SITE_OTHER): Admitting: Family Medicine

## 2023-08-30 ENCOUNTER — Encounter: Payer: Self-pay | Admitting: Family Medicine

## 2023-08-30 ENCOUNTER — Other Ambulatory Visit (HOSPITAL_BASED_OUTPATIENT_CLINIC_OR_DEPARTMENT_OTHER): Payer: Self-pay

## 2023-08-30 VITALS — BP 116/76 | HR 90 | Temp 98.0°F | Ht 64.0 in | Wt 207.0 lb

## 2023-08-30 DIAGNOSIS — I10 Essential (primary) hypertension: Secondary | ICD-10-CM

## 2023-08-30 DIAGNOSIS — E1165 Type 2 diabetes mellitus with hyperglycemia: Secondary | ICD-10-CM | POA: Diagnosis not present

## 2023-08-30 DIAGNOSIS — E785 Hyperlipidemia, unspecified: Secondary | ICD-10-CM

## 2023-08-30 DIAGNOSIS — Z0000 Encounter for general adult medical examination without abnormal findings: Secondary | ICD-10-CM

## 2023-08-30 DIAGNOSIS — Z794 Long term (current) use of insulin: Secondary | ICD-10-CM

## 2023-08-30 DIAGNOSIS — E039 Hypothyroidism, unspecified: Secondary | ICD-10-CM | POA: Diagnosis not present

## 2023-08-30 MED ORDER — HUMALOG KWIKPEN 200 UNIT/ML ~~LOC~~ SOPN
10.0000 [IU] | PEN_INJECTOR | Freq: Two times a day (BID) | SUBCUTANEOUS | 1 refills | Status: DC
  Filled 2023-08-30: qty 3, 30d supply, fill #0
  Filled 2023-12-13 – 2024-04-23 (×2): qty 3, 30d supply, fill #1

## 2023-08-30 NOTE — Progress Notes (Signed)
 Established Patient Office Visit  Subjective   Patient ID: [name] Sherman, female    DOB: April 20, 1961  Age: 63 y.o. MRN: [id number]  Chief Complaint  Patient presents with   Annual Exam    HPI   [name] Sherman is seen today for physical exam.  She has history of hypertension, migraine headaches, obstructive sleep apnea, GERD, fatty liver, hypothyroidism, type 2 diabetes, ADD  She has history of gastric bypass and has lost a substantial amount of weight.  Initially was feeling very well but recently has had some problems with intermittent elevated blood sugars and some trouble some weight gain around the abdomen and feels like she is more swollen in the face.  She has concerns for hypercortisolism few years ago but lab work did not reveal this.  She has had tremendous stress issues with working full-time and going to school.  She is currently in grad school working on her masters.  Recently had some dizziness and stopped losartan on her own.  She also has not been taking Lipitor.  She does remain on levothyroxine 50 mcg daily.  She also has some chronic back pain and is on chronic opioids.  Health maintenance reviewed:  Health Maintenance  Topic Date Due   FOOT EXAM  Never done   Diabetic kidney evaluation - Urine ACR  Never done   Cervical Cancer Screening (HPV/Pap Cotest)  12/05/2015   Zoster Vaccines- Shingrix (2 of 2) 05/11/2021   HEMOGLOBIN A1C  09/05/2022   COVID-19 Vaccine (6 - 2024-25 season) 02/26/2023   Medicare Annual Wellness (AWV)  07/01/2023   OPHTHALMOLOGY EXAM  08/30/2023 (Originally 07/09/2023)   Pneumococcal Vaccine 60-38 Years old (1 of 2 - PCV) 08/29/2024 (Originally 10/10/1966)   Diabetic kidney evaluation - eGFR measurement  03/05/2024   MAMMOGRAM  04/24/2025   Colonoscopy  05/06/2026   DTaP/Tdap/Td (3 - Td or Tdap) 03/21/2029   INFLUENZA VACCINE  Completed   Hepatitis C Screening  Completed   HIV Screening  Completed   HPV VACCINES  Aged Out   -Declines  shingles vaccine. -Mammogram up-to-date. -Colonoscopy due 2027  Family History  Problem Relation Age of Onset   Alcohol abuse Mother    Lung cancer Mother    Colon cancer Mother    Arthritis Father    Hyperlipidemia Father    Heart disease Father    Hypertension Father    COPD Father    Kidney disease Paternal Uncle    Cancer Maternal Grandmother        colon   Colon cancer Maternal Grandmother    Rectal cancer Maternal Grandmother    Cancer Paternal Grandmother        breast   Kidney cancer Sister        lung cancer   Cirrhosis Sister    Esophageal cancer Neg Hx    Stomach cancer Neg Hx    Social History   Socioeconomic History   Marital status: Married    Spouse name: Not on file   Number of children: Not on file   Years of education: Not on file   Highest education level: Not on file  Occupational History   Not on file  Tobacco Use   Smoking status: Never   Smokeless tobacco: Never  Vaping Use   Vaping status: Never Used  Substance and Sexual Activity   Alcohol use: Yes    Comment: socially   Drug use: Never   Sexual activity: Yes  Other Topics Concern  Not on file  Social History Narrative   Not on file   Social Drivers of Health   Financial Resource Strain: Low Risk  (06/08/2021)   Overall Financial Resource Strain (CARDIA)    Difficulty of Paying Living Expenses: Not hard at all  Food Insecurity: No Food Insecurity (06/30/2022)   Hunger Vital Sign    Worried About Running Out of Food in the Last Year: Never true    Ran Out of Food in the Last Year: Never true  Transportation Needs: No Transportation Needs (06/30/2022)   PRAPARE - Administrator, Civil Service (Medical): No    Lack of Transportation (Non-Medical): No  Physical Activity: Sufficiently Active (06/30/2022)   Exercise Vital Sign    Days of Exercise per Week: 7 days    Minutes of Exercise per Session: 30 min  Stress: No Stress Concern Present (06/30/2022)   [employer] of  Occupational Health - Occupational Stress Questionnaire    Feeling of Stress : Not at all  Social Connections: Socially Integrated (06/30/2022)   Social Connection and Isolation Panel [NHANES]    Frequency of Communication with Friends and Family: More than three times a week    Frequency of Social Gatherings with Friends and Family: More than three times a week    Attends Religious Services: More than 4 times per year    Active Member of Clubs or Organizations: Yes    Attends Banker Meetings: More than 4 times per year    Marital Status: Married  Catering manager Violence: Not At Risk (06/30/2022)   Humiliation, Afraid, Rape, and Kick questionnaire    Fear of Current or Ex-Partner: No    Emotionally Abused: No    Physically Abused: No    Sexually Abused: No   Past Medical History:  Diagnosis Date   ADD (attention deficit disorder)    Anemia    Anxiety    Arthritis    shoulder, right (arthritis, tendonitis, bursitis)   Chronic low back pain    Complication of anesthesia    Dyspnea    Fatty liver    GERD (gastroesophageal reflux disease)    History of cervical cancer    s/p  laser ablation of cervix 1980's   History of COVID-19 07/2019   History of kidney stones    Hyperlipidemia    Hypertension    Hypothyroidism    IBS (irritable bowel syndrome)    MDD (major depressive disorder)    Migraine    OSA (obstructive sleep apnea)    pt used cpap up until 2013 states lost wt and did not need anymore   PONV (postoperative nausea and vomiting)    Restless legs    Sleep apnea    wears CPAP   Type 2 diabetes mellitus (HCC)    Urgency of urination    Vertigo    Vitamin D deficiency    Vulvar cyst    Past Surgical History:  Procedure Laterality Date   BUNIONECTOMY Right 2004   COLONOSCOPY     CYSTOSCOPY W/ RETROGRADES Bilateral 05/26/2015   Procedure: CYSTOSCOPY WITH RETROGRADE PYELOGRAM;  Surgeon: Jerilee Field, MD;  Location: [facility];   Service: Urology;  Laterality: Bilateral;   CYSTOSCOPY W/ URETERAL STENT REMOVAL Left 05/26/2015   Procedure: CYSTOSCOPY WITH STENT REMOVAL;  Surgeon: Jerilee Field, MD;  Location: [facility];  Service: Urology;  Laterality: Left;   CYSTOSCOPY WITH RETROGRADE PYELOGRAM, URETEROSCOPY AND STENT PLACEMENT Left 05/01/2015  Procedure: CYSTOSCOPY WITH RETROGRADE PYELOGRAM AND STENT PLACEMENT;  Surgeon: Jerilee Field, MD;  Location: [facility];  Service: Urology;  Laterality: Left;   CYSTOSCOPY WITH STENT PLACEMENT Bilateral 05/26/2015   Procedure: CYSTOSCOPY WITH STENT PLACEMENT;  Surgeon: Jerilee Field, MD;  Location: [facility];  Service: Urology;  Laterality: Bilateral;   CYSTOSCOPY WITH URETEROSCOPY Bilateral 05/26/2015   Procedure: CYSTOSCOPY WITH URETEROSCOPY;  Surgeon: Jerilee Field, MD;  Location: [facility];  Service: Urology;  Laterality: Bilateral;   CYSTOSCOPY/URETEROSCOPY/HOLMIUM LASER/STENT PLACEMENT Right 06/09/2015   Procedure: CYSTOSCOPY/URETEROSCOPY/STENT PLACEMENT REMOVAL LEFT URETERAL STENT;  Surgeon: Jerilee Field, MD;  Location: [facility];  Service: Urology;  Laterality: Right;   DIAGNOSTIC LAPAROSCOPY     HOLMIUM LASER APPLICATION Left 05/26/2015   Procedure: HOLMIUM LASER APPLICATION;  Surgeon: Jerilee Field, MD;  Location: [facility];  Service: Urology;  Laterality: Left;   HOLMIUM LASER APPLICATION Right 06/09/2015   Procedure: HOLMIUM LASER APPLICATION;  Surgeon: Jerilee Field, MD;  Location: [facility];  Service: Urology;  Laterality: Right;   INSERTION OF MESH N/A 09/05/2016   Procedure: INSERTION OF MESH;  Surgeon: Berna Bue, MD;  Location: [facility];  Service: General;  Laterality: N/A;   LAPAROSCOPIC ASSISTED VAGINAL HYSTERECTOMY  04/23/2002   @[facility]   by Dr Jackelyn Knife;  With Left Salpingoophorectomy/  Anterior Repair/  Tension Free Tape Sling placement   LAPAROSCOPIC GASTRIC SLEEVE RESECTION N/A  09/07/2021   Procedure: LAPAROSCOPIC GASTRIC SLEEVE RESECTION;  Surgeon: Berna Bue, MD;  Location: [facility];  Service: General;  Laterality: N/A;   LASER ABLATION OF THE CERVIX  x2  1980's   POSTERIOR LAMINECTOMY / DECOMPRESSION LUMBAR SPINE  02/03/2020   @[facility]   Dr Danielle Dess;   L4--5 and fusion   POSTERIOR LUMBAR FUSION  01/21/2019   @[facility]  by Dr Danielle Dess;   L5--S1   TUBAL LIGATION  1980's   UPPER GI ENDOSCOPY N/A 09/07/2021   Procedure: UPPER GI ENDOSCOPY;  Surgeon: Berna Bue, MD;  Location: [facility];  Service: General;  Laterality: N/A;   VENTRAL HERNIA REPAIR N/A 09/05/2016   Procedure: LAPAROSCOPIC VENTRAL HERNIA;  Surgeon: Berna Bue, MD;  Location: [facility];  Service: General;  Laterality: N/A;    reports that she has never smoked. She has never used smokeless tobacco. She reports current alcohol use. She reports that she does not use drugs. family history includes Alcohol abuse in her mother; Arthritis in her father; COPD in her father; Cancer in her maternal grandmother and paternal grandmother; Cirrhosis in her sister; Colon cancer in her maternal grandmother and mother; Heart disease in her father; Hyperlipidemia in her father; Hypertension in her father; Kidney cancer in her sister; Kidney disease in her paternal uncle; Lung cancer in her mother; Rectal cancer in her maternal grandmother. Allergies  Allergen Reactions   Keflex [Cephalexin] Swelling and Other (See Comments)    Tongue swelling     Review of Systems  Constitutional:  Positive for malaise/fatigue. Negative for chills and fever.  Eyes:  Negative for blurred vision.  Respiratory:  Negative for shortness of breath.   Cardiovascular:  Negative for chest pain.  Gastrointestinal:  Negative for abdominal pain.  Genitourinary:  Negative for dysuria.  Neurological:  Positive for dizziness. Negative for weakness and headaches.      Objective:     BP 116/76 (BP Location: Left Arm, Patient Position: Sitting, Cuff  Size: Normal)   Pulse 90   Temp 98 F (36.7 C) (Oral)  Ht 5\' 4"  (1.626 m)   Wt 207 lb (93.9 kg)   SpO2 96%   BMI 35.53 kg/m  BP Readings from Last 3 Encounters:  08/30/23 116/76  03/07/23 129/73  12/27/22 122/68   Wt Readings from Last 3 Encounters:  08/30/23 207 lb (93.9 kg)  03/06/23 200 lb (90.7 kg)  12/27/22 193 lb 9.6 oz (87.8 kg)      Physical Exam Vitals reviewed.  Constitutional:      Appearance: She is well-developed.  HENT:     Head: Normocephalic and atraumatic.     Ears:     Comments: Has some cerumen right ear canal Eyes:     Pupils: Pupils are equal, round, and reactive to light.  Neck:     Thyroid: No thyromegaly.  Cardiovascular:     Rate and Rhythm: Normal rate and regular rhythm.     Heart sounds: Normal heart sounds. No murmur heard. Pulmonary:     Effort: No respiratory distress.     Breath sounds: Normal breath sounds. No wheezing or rales.  Abdominal:     General: Bowel sounds are normal. There is no distension.     Palpations: Abdomen is soft. There is no mass.     Tenderness: There is no abdominal tenderness. There is no guarding or rebound.  Musculoskeletal:        General: Normal range of motion.     Cervical back: Normal range of motion and neck supple.     Right lower leg: No edema.     Left lower leg: No edema.  Lymphadenopathy:     Cervical: No cervical adenopathy.  Skin:    Findings: No rash.  Neurological:     Mental Status: She is alert and oriented to person, place, and time.     Cranial Nerves: No cranial nerve deficit.  Psychiatric:        Behavior: Behavior normal.        Thought Content: Thought content normal.        Judgment: Judgment normal.      No results found for any visits on 08/30/23.    The ASCVD Risk score (Arnett DK, et al., 2019) failed to calculate for the following reasons:   Cannot find a previous HDL lab   Cannot find a previous total cholesterol lab    Assessment & Plan:   Physical exam.   She has multiple chronic medical problems as above.  Has lost substantial weight following gastric bypass.  She has chronic problems occluding hyperlipidemia, hypothyroidism, hypertension, type 2 diabetes.  Blood pressure currently stable off losartan.  Needs follow-up labs.  -Obtain further labs with CBC, CMP, lipid, TSH, A1c, urine microalbumin screen -Discussed Shingrix vaccine and she declines -She is having occasional spikes in blood sugar over 200 and requesting Humalog which she takes infrequently for elevated glucose. -Continue annual mammograms -Colonoscopy up-to-date -Discussed pneumonia vaccine recommendations that she declines at this time -Recommend annual flu vaccine -Continue to hold losartan at this time with excellent blood pressure today off losartan during the past week   No follow-ups on file.    Evelena Peat, MD

## 2023-08-31 ENCOUNTER — Other Ambulatory Visit (HOSPITAL_BASED_OUTPATIENT_CLINIC_OR_DEPARTMENT_OTHER): Payer: Self-pay

## 2023-08-31 LAB — BASIC METABOLIC PANEL
BUN: 17 mg/dL (ref 6–23)
CO2: 25 meq/L (ref 19–32)
Calcium: 9.3 mg/dL (ref 8.4–10.5)
Chloride: 104 meq/L (ref 96–112)
Creatinine, Ser: 0.73 mg/dL (ref 0.40–1.20)
GFR: 89.09 mL/min (ref >60.00)
Glucose, Bld: 120 mg/dL — ABNORMAL HIGH (ref 70–99)
Potassium: 4 meq/L (ref 3.5–5.1)
Sodium: 138 meq/L (ref 135–145)

## 2023-08-31 LAB — HEPATIC FUNCTION PANEL
ALT: 31 U/L (ref 0–35)
AST: 20 U/L (ref 0–37)
Albumin: 3.6 g/dL (ref 3.5–5.2)
Alkaline Phosphatase: 105 U/L (ref 39–117)
Bilirubin, Direct: 0 mg/dL (ref 0.0–0.3)
Total Bilirubin: 0.3 mg/dL (ref 0.2–1.2)
Total Protein: 6.8 g/dL (ref 6.0–8.3)

## 2023-08-31 LAB — MICROALBUMIN / CREATININE URINE RATIO
Creatinine,U: 186.8 mg/dL
Microalb Creat Ratio: 28.6 mg/g (ref 0.0–30.0)
Microalb, Ur: 5.3 mg/dL — ABNORMAL HIGH (ref 0.0–1.9)

## 2023-08-31 LAB — CBC WITH DIFFERENTIAL/PLATELET
Basophils Absolute: 0.1 10*3/uL (ref 0.0–0.1)
Basophils Relative: 0.9 % (ref 0.0–3.0)
Eosinophils Absolute: 0.1 10*3/uL (ref 0.0–0.7)
Eosinophils Relative: 0.9 % (ref 0.0–5.0)
HCT: 40.6 % (ref 36.0–46.0)
Hemoglobin: 13.5 g/dL (ref 12.0–15.0)
Lymphocytes Relative: 24.9 % (ref 12.0–46.0)
Lymphs Abs: 3.2 10*3/uL (ref 0.7–4.0)
MCHC: 33.3 g/dL (ref 30.0–36.0)
MCV: 86 fl (ref 78.0–100.0)
Monocytes Absolute: 0.9 10*3/uL (ref 0.1–1.0)
Monocytes Relative: 7.1 % (ref 3.0–12.0)
Neutro Abs: 8.5 10*3/uL — ABNORMAL HIGH (ref 1.4–7.7)
Neutrophils Relative %: 66.2 % (ref 43.0–77.0)
Platelets: 349 10*3/uL (ref 150.0–400.0)
RBC: 4.72 Mil/uL (ref 3.87–5.11)
RDW: 15.4 % (ref 11.5–15.5)
WBC: 12.9 10*3/uL — ABNORMAL HIGH (ref 4.0–10.5)

## 2023-08-31 LAB — LIPID PANEL
Cholesterol: 297 mg/dL — ABNORMAL HIGH (ref 0–200)
HDL: 60.6 mg/dL (ref >39.00)
LDL Cholesterol: 194 mg/dL — ABNORMAL HIGH (ref 0–99)
NonHDL: 236.73
Total CHOL/HDL Ratio: 5
Triglycerides: 215 mg/dL — ABNORMAL HIGH (ref 0.0–149.0)
VLDL: 43 mg/dL — ABNORMAL HIGH (ref 0.0–40.0)

## 2023-08-31 LAB — TSH: TSH: 1.79 u[IU]/mL (ref 0.35–5.50)

## 2023-08-31 LAB — HEMOGLOBIN A1C: Hgb A1c MFr Bld: 6.5 % (ref 4.6–6.5)

## 2023-09-10 ENCOUNTER — Other Ambulatory Visit: Payer: Self-pay

## 2023-09-10 ENCOUNTER — Emergency Department (HOSPITAL_BASED_OUTPATIENT_CLINIC_OR_DEPARTMENT_OTHER)
Admission: EM | Admit: 2023-09-10 | Discharge: 2023-09-11 | Disposition: A | Discharge status: Home / Self Care | Attending: Emergency Medicine | Admitting: Emergency Medicine

## 2023-09-10 DIAGNOSIS — E119 Type 2 diabetes mellitus without complications: Secondary | ICD-10-CM | POA: Insufficient documentation

## 2023-09-10 DIAGNOSIS — R519 Headache, unspecified: Secondary | ICD-10-CM | POA: Diagnosis present

## 2023-09-10 DIAGNOSIS — L03211 Cellulitis of face: Secondary | ICD-10-CM | POA: Diagnosis not present

## 2023-09-10 DIAGNOSIS — I10 Essential (primary) hypertension: Secondary | ICD-10-CM | POA: Diagnosis not present

## 2023-09-10 DIAGNOSIS — Z8616 Personal history of COVID-19: Secondary | ICD-10-CM | POA: Diagnosis not present

## 2023-09-10 DIAGNOSIS — E039 Hypothyroidism, unspecified: Secondary | ICD-10-CM | POA: Insufficient documentation

## 2023-09-10 LAB — BASIC METABOLIC PANEL
Anion gap: 10 (ref 5–15)
BUN: 13 mg/dL (ref 6–20)
CO2: 27 mmol/L (ref 22–32)
Calcium: 9.4 mg/dL (ref 8.9–10.3)
Chloride: 102 mmol/L (ref 98–111)
Creatinine, Ser: 0.71 mg/dL (ref 0.44–1.00)
GFR, Estimated: >60 mL/min (ref >60)
Glucose, Bld: 115 mg/dL — ABNORMAL HIGH (ref 70–99)
Potassium: 3.7 mmol/L (ref 3.5–5.1)
Sodium: 139 mmol/L (ref 135–145)

## 2023-09-10 LAB — CBC WITH DIFFERENTIAL/PLATELET
Abs Immature Granulocytes: 0.1 10*3/uL — ABNORMAL HIGH (ref 0.00–0.07)
Basophils Absolute: 0.1 10*3/uL (ref 0.0–0.1)
Basophils Relative: 1 %
Eosinophils Absolute: 0.1 10*3/uL (ref 0.0–0.5)
Eosinophils Relative: 1 %
HCT: 43.5 % (ref 36.0–46.0)
Hemoglobin: 14.3 g/dL (ref 12.0–15.0)
Immature Granulocytes: 1 %
Lymphocytes Relative: 20 %
Lymphs Abs: 3.2 10*3/uL (ref 0.7–4.0)
MCH: 28.6 pg (ref 26.0–34.0)
MCHC: 32.9 g/dL (ref 30.0–36.0)
MCV: 87 fL (ref 80.0–100.0)
Monocytes Absolute: 1 10*3/uL (ref 0.1–1.0)
Monocytes Relative: 7 %
Neutro Abs: 11.3 10*3/uL — ABNORMAL HIGH (ref 1.7–7.7)
Neutrophils Relative %: 70 %
Platelets: 371 10*3/uL (ref 150–400)
RBC: 5 MIL/uL (ref 3.87–5.11)
RDW: 14.6 % (ref 11.5–15.5)
WBC: 15.9 10*3/uL — ABNORMAL HIGH (ref 4.0–10.5)
nRBC: 0 % (ref 0.0–0.2)

## 2023-09-10 NOTE — ED Triage Notes (Signed)
 Pt POV reporting R side facial swelling and pain that began 2 days ago. Says the pain that started under R nostril, redness noted to area.

## 2023-09-11 ENCOUNTER — Other Ambulatory Visit (HOSPITAL_BASED_OUTPATIENT_CLINIC_OR_DEPARTMENT_OTHER): Payer: Self-pay

## 2023-09-11 ENCOUNTER — Other Ambulatory Visit: Payer: Self-pay

## 2023-09-11 DIAGNOSIS — L03211 Cellulitis of face: Secondary | ICD-10-CM | POA: Diagnosis not present

## 2023-09-11 MED ORDER — SULFAMETHOXAZOLE-TRIMETHOPRIM 800-160 MG PO TABS
1.0000 | ORAL_TABLET | Freq: Once | ORAL | Status: AC
  Administered 2023-09-11: 1 via ORAL
  Filled 2023-09-11: qty 1

## 2023-09-11 MED ORDER — PREDNISONE 20 MG PO TABS
40.0000 mg | ORAL_TABLET | Freq: Once | ORAL | Status: AC
  Administered 2023-09-11: 40 mg via ORAL
  Filled 2023-09-11: qty 2

## 2023-09-11 MED ORDER — HYDROCODONE-ACETAMINOPHEN 5-325 MG PO TABS
1.0000 | ORAL_TABLET | ORAL | 0 refills | Status: DC | PRN
  Filled 2023-09-11: qty 6, 1d supply, fill #0

## 2023-09-11 MED ORDER — AMOXICILLIN-POT CLAVULANATE 875-125 MG PO TABS
1.0000 | ORAL_TABLET | Freq: Once | ORAL | Status: AC
  Administered 2023-09-11: 1 via ORAL
  Filled 2023-09-11: qty 1

## 2023-09-11 MED ORDER — SULFAMETHOXAZOLE-TRIMETHOPRIM 800-160 MG PO TABS
1.0000 | ORAL_TABLET | Freq: Two times a day (BID) | ORAL | 0 refills | Status: AC
  Filled 2023-09-11 (×2): qty 14, 7d supply, fill #0

## 2023-09-11 MED ORDER — HYDROCODONE-ACETAMINOPHEN 5-325 MG PO TABS
1.0000 | ORAL_TABLET | Freq: Once | ORAL | Status: AC
  Administered 2023-09-11: 1 via ORAL
  Filled 2023-09-11: qty 1

## 2023-09-11 MED ORDER — AMOXICILLIN-POT CLAVULANATE 875-125 MG PO TABS
1.0000 | ORAL_TABLET | Freq: Two times a day (BID) | ORAL | 0 refills | Status: DC
  Filled 2023-09-11: qty 14, 7d supply, fill #0

## 2023-09-11 NOTE — Discharge Instructions (Signed)
 You were evaluated in the Emergency Department and after careful evaluation, we did not find any emergent condition requiring admission or further testing in the hospital.  Your exam/testing today is overall reassuring.  Symptoms likely due to a bacterial cellulitis of the face.  Take the Augmentin and Bactrim antibiotics as prescribed.  Use Tylenol or Motrin for discomfort.  Can use the Norco medication at night for pain keeping you from sleeping as we discussed.  Please return to the Emergency Department if you experience any worsening of your condition.   Thank you for allowing [place] to be a part of your care.

## 2023-09-11 NOTE — ED Provider Notes (Signed)
 [facility] Emergency Department Provider Note MRN:  [id number]  Arrival date & time: 09/11/23     Chief Complaint   Facial pain History of Present Illness   [name] Sherman is a 63 y.o. year-old female with a history of diabetes presenting to the ED with chief complaint of facial pain.  Pain below the right nostril for the past day or 2, getting much worse today with spreading of the pain redness and swelling to the upper lip and right side of the face.  Denies fever.  Review of Systems  A thorough review of systems was obtained and all systems are negative except as noted in the HPI and PMH.   Patient's Health History    Past Medical History:  Diagnosis Date   ADD (attention deficit disorder)    Anemia    Anxiety    Arthritis    shoulder, right (arthritis, tendonitis, bursitis)   Chronic low back pain    Complication of anesthesia    Dyspnea    Fatty liver    GERD (gastroesophageal reflux disease)    History of cervical cancer    s/p  laser ablation of cervix 1980's   History of COVID-19 07/2019   History of kidney stones    Hyperlipidemia    Hypertension    Hypothyroidism    IBS (irritable bowel syndrome)    MDD (major depressive disorder)    Migraine    OSA (obstructive sleep apnea)    pt used cpap up until 2013 states lost wt and did not need anymore   PONV (postoperative nausea and vomiting)    Restless legs    Sleep apnea    wears CPAP   Type 2 diabetes mellitus (HCC)    Urgency of urination    Vertigo    Vitamin D deficiency    Vulvar cyst     Past Surgical History:  Procedure Laterality Date   BUNIONECTOMY Right 2004   COLONOSCOPY     CYSTOSCOPY W/ RETROGRADES Bilateral 05/26/2015   Procedure: CYSTOSCOPY WITH RETROGRADE PYELOGRAM;  Surgeon: Jerilee Field, MD;  Location: [facility];  Service: Urology;  Laterality: Bilateral;   CYSTOSCOPY W/ URETERAL STENT REMOVAL Left 05/26/2015   Procedure: CYSTOSCOPY  WITH STENT REMOVAL;  Surgeon: Jerilee Field, MD;  Location: [facility];  Service: Urology;  Laterality: Left;   CYSTOSCOPY WITH RETROGRADE PYELOGRAM, URETEROSCOPY AND STENT PLACEMENT Left 05/01/2015   Procedure: CYSTOSCOPY WITH RETROGRADE PYELOGRAM AND STENT PLACEMENT;  Surgeon: Jerilee Field, MD;  Location: [facility];  Service: Urology;  Laterality: Left;   CYSTOSCOPY WITH STENT PLACEMENT Bilateral 05/26/2015   Procedure: CYSTOSCOPY WITH STENT PLACEMENT;  Surgeon: Jerilee Field, MD;  Location: [facility];  Service: Urology;  Laterality: Bilateral;   CYSTOSCOPY WITH URETEROSCOPY Bilateral 05/26/2015   Procedure: CYSTOSCOPY WITH URETEROSCOPY;  Surgeon: Jerilee Field, MD;  Location: [facility];  Service: Urology;  Laterality: Bilateral;   CYSTOSCOPY/URETEROSCOPY/HOLMIUM LASER/STENT PLACEMENT Right 06/09/2015   Procedure: CYSTOSCOPY/URETEROSCOPY/STENT PLACEMENT REMOVAL LEFT URETERAL STENT;  Surgeon: Jerilee Field, MD;  Location: [facility];  Service: Urology;  Laterality: Right;   DIAGNOSTIC LAPAROSCOPY     HOLMIUM LASER APPLICATION Left 05/26/2015   Procedure: HOLMIUM LASER APPLICATION;  Surgeon: Jerilee Field, MD;  Location: [facility];  Service: Urology;  Laterality: Left;   HOLMIUM LASER APPLICATION Right 06/09/2015   Procedure: HOLMIUM LASER APPLICATION;  Surgeon: Jerilee Field, MD;  Location: [facility];  Service: Urology;  Laterality: Right;  INSERTION OF MESH N/A 09/05/2016   Procedure: INSERTION OF MESH;  Surgeon: Berna Bue, MD;  Location: [facility];  Service: General;  Laterality: N/A;   LAPAROSCOPIC ASSISTED VAGINAL HYSTERECTOMY  04/23/2002   @[facility]   by Dr Jackelyn Knife;  With Left Salpingoophorectomy/  Anterior Repair/  Tension Free Tape Sling placement   LAPAROSCOPIC GASTRIC SLEEVE RESECTION N/A 09/07/2021   Procedure: LAPAROSCOPIC GASTRIC SLEEVE RESECTION;  Surgeon: Berna Bue, MD;  Location: [facility];  Service:  General;  Laterality: N/A;   LASER ABLATION OF THE CERVIX  x2  1980's   POSTERIOR LAMINECTOMY / DECOMPRESSION LUMBAR SPINE  02/03/2020   @[facility]   Dr Danielle Dess;   L4--5 and fusion   POSTERIOR LUMBAR FUSION  01/21/2019   @[facility]  by Dr Danielle Dess;   L5--S1   TUBAL LIGATION  1980's   UPPER GI ENDOSCOPY N/A 09/07/2021   Procedure: UPPER GI ENDOSCOPY;  Surgeon: Berna Bue, MD;  Location: [facility];  Service: General;  Laterality: N/A;   VENTRAL HERNIA REPAIR N/A 09/05/2016   Procedure: LAPAROSCOPIC VENTRAL HERNIA;  Surgeon: Berna Bue, MD;  Location: [facility];  Service: General;  Laterality: N/A;    Family History  Problem Relation Age of Onset   Alcohol abuse Mother    Lung cancer Mother    Colon cancer Mother    Arthritis Father    Hyperlipidemia Father    Heart disease Father    Hypertension Father    COPD Father    Kidney disease Paternal Uncle    Cancer Maternal Grandmother        colon   Colon cancer Maternal Grandmother    Rectal cancer Maternal Grandmother    Cancer Paternal Grandmother        breast   Kidney cancer Sister        lung cancer   Cirrhosis Sister    Esophageal cancer Neg Hx    Stomach cancer Neg Hx     Social History   Socioeconomic History   Marital status: Married    Spouse name: Not on file   Number of children: Not on file   Years of education: Not on file   Highest education level: Not on file  Occupational History   Not on file  Tobacco Use   Smoking status: Never   Smokeless tobacco: Never  Vaping Use   Vaping status: Never Used  Substance and Sexual Activity   Alcohol use: Yes    Comment: socially   Drug use: Never   Sexual activity: Yes  Other Topics Concern   Not on file  Social History Narrative   Not on file   Social Drivers of Health   Financial Resource Strain: Low Risk  (06/08/2021)   Overall Financial Resource Strain (CARDIA)    Difficulty of Paying Living Expenses: Not hard at all  Food Insecurity: No Food Insecurity (06/30/2022)    Hunger Vital Sign    Worried About Running Out of Food in the Last Year: Never true    Ran Out of Food in the Last Year: Never true  Transportation Needs: No Transportation Needs (06/30/2022)   PRAPARE - Administrator, Civil Service (Medical): No    Lack of Transportation (Non-Medical): No  Physical Activity: Sufficiently Active (06/30/2022)   Exercise Vital Sign    Days of Exercise per Week: 7 days    Minutes of Exercise per Session: 30 min  Stress: No Stress Concern Present (06/30/2022)   [employer] of Occupational  Health - Occupational Stress Questionnaire    Feeling of Stress : Not at all  Social Connections: Socially Integrated (06/30/2022)   Social Connection and Isolation Panel [NHANES]    Frequency of Communication with Friends and Family: More than three times a week    Frequency of Social Gatherings with Friends and Family: More than three times a week    Attends Religious Services: More than 4 times per year    Active Member of [employer] or Organizations: Yes    Attends Engineer, structural: More than 4 times per year    Marital Status: Married  Catering manager Violence: Not At Risk (06/30/2022)   Humiliation, Afraid, Rape, and Kick questionnaire    Fear of Current or Ex-Partner: No    Emotionally Abused: No    Physically Abused: No    Sexually Abused: No     Physical Exam   Vitals:   09/10/23 2125  BP: (!) 154/87  Pulse: (!) 113  Resp: 16  Temp: 98.6 F (37 C)  SpO2: 97%    CONSTITUTIONAL: Well-appearing, NAD NEURO/PSYCH:  Alert and oriented x 3, no focal deficits EYES:  eyes equal and reactive ENT/NECK:  no LAD, no JVD CARDIO: Regular rate, well-perfused, normal S1 and S2 PULM:  CTAB no wheezing or rhonchi GI/GU:  non-distended, non-tender MSK/SPINE:  No gross deformities, no edema SKIN:  no rash, atraumatic   *Additional and/or pertinent findings included in MDM below  Diagnostic and Interventional Summary    EKG  Interpretation Date/Time:    Ventricular Rate:    PR Interval:    QRS Duration:    QT Interval:    QTC Calculation:   R Axis:      Text Interpretation:         Labs Reviewed  CBC WITH DIFFERENTIAL/PLATELET - Abnormal; Notable for the following components:      Result Value   WBC 15.9 (*)    Neutro Abs 11.3 (*)    Abs Immature Granulocytes 0.10 (*)    All other components within normal limits  BASIC METABOLIC PANEL - Abnormal; Notable for the following components:   Glucose, Bld 115 (*)    All other components within normal limits    No orders to display    Medications  predniSONE (DELTASONE) tablet 40 mg (has no administration in time range)  amoxicillin-clavulanate (AUGMENTIN) 875-125 MG per tablet 1 tablet (has no administration in time range)  sulfamethoxazole-trimethoprim (BACTRIM DS) 800-160 MG per tablet 1 tablet (has no administration in time range)  HYDROcodone-acetaminophen (NORCO/VICODIN) 5-325 MG per tablet 1 tablet (has no administration in time range)     Procedures  /  Critical Care Procedures  ED Course and Medical Decision Making  Initial Impression and Ddx Patient is presenting with erythema and induration to the soft tissues of the right upper lip consistent with cellulitis.  I feel no fluctuance or area that would suggest need for I&D.  She is not systemically ill.  Seems appropriate for.  Treatment with oral antibiotics and return precautions.  Past medical/surgical history that increases complexity of ED encounter: Diabetes  Interpretation of Diagnostics I personally reviewed the Laboratory Testing and my interpretation is as follows: Leukocytosis noted    Patient Reassessment and Ultimate Disposition/Management     Discharge  Patient management required discussion with the following services or consulting groups:  None  Complexity of Problems Addressed Acute illness or injury that poses threat of life of bodily function  Additional Data  Reviewed  and Analyzed Further history obtained from: Further history from spouse/family member  Additional Factors Impacting ED Encounter Risk Prescriptions  Elmer Sow. Pilar Plate, MD [facility] Emergency Medicine [facility] [email or web address]  Final Clinical Impressions(s) / ED Diagnoses     ICD-10-CM   1. Facial cellulitis  L03.211       ED Discharge Orders          Ordered    amoxicillin-clavulanate (AUGMENTIN) 875-125 MG tablet  Every 12 hours        09/11/23 0010    HYDROcodone-acetaminophen (NORCO/VICODIN) 5-325 MG tablet  Every 4 hours PRN        09/11/23 0010    sulfamethoxazole-trimethoprim (BACTRIM DS) 800-160 MG tablet  2 times daily        09/11/23 0010             Discharge Instructions Discussed with and Provided to Patient:    Discharge Instructions      You were evaluated in the Emergency Department and after careful evaluation, we did not find any emergent condition requiring admission or further testing in the hospital.  Your exam/testing today is overall reassuring.  Symptoms likely due to a bacterial cellulitis of the face.  Take the Augmentin and Bactrim antibiotics as prescribed.  Use Tylenol or Motrin for discomfort.  Can use the Norco medication at night for pain keeping you from sleeping as we discussed.  Please return to the Emergency Department if you experience any worsening of your condition.   Thank you for allowing [place] to be a part of your care.      Sabas Sous, MD 09/11/23 [phone]

## 2023-09-12 ENCOUNTER — Other Ambulatory Visit (HOSPITAL_BASED_OUTPATIENT_CLINIC_OR_DEPARTMENT_OTHER): Payer: Self-pay

## 2023-09-12 ENCOUNTER — Other Ambulatory Visit: Payer: Self-pay

## 2023-09-12 DIAGNOSIS — M961 Postlaminectomy syndrome, not elsewhere classified: Secondary | ICD-10-CM | POA: Diagnosis not present

## 2023-09-12 DIAGNOSIS — M5416 Radiculopathy, lumbar region: Secondary | ICD-10-CM | POA: Diagnosis not present

## 2023-09-12 MED ORDER — CYCLOBENZAPRINE HCL 10 MG PO TABS
10.0000 mg | ORAL_TABLET | Freq: Two times a day (BID) | ORAL | 3 refills | Status: AC
  Filled 2023-09-12: qty 180, 90d supply, fill #0
  Filled 2023-12-13: qty 180, 90d supply, fill #1

## 2023-09-12 MED ORDER — OXYCODONE-ACETAMINOPHEN 10-325 MG PO TABS
1.0000 | ORAL_TABLET | Freq: Three times a day (TID) | ORAL | 0 refills | Status: DC | PRN
  Filled 2023-11-13: qty 90, 30d supply, fill #0

## 2023-09-12 MED ORDER — NORTRIPTYLINE HCL 50 MG PO CAPS
50.0000 mg | ORAL_CAPSULE | Freq: Two times a day (BID) | ORAL | 3 refills | Status: AC
  Filled 2023-09-12: qty 180, 90d supply, fill #0

## 2023-09-12 MED ORDER — FLUCONAZOLE 150 MG PO TABS
150.0000 mg | ORAL_TABLET | ORAL | 0 refills | Status: DC
  Filled 2023-09-12: qty 2, 3d supply, fill #0

## 2023-09-12 MED ORDER — OXYCODONE-ACETAMINOPHEN 10-325 MG PO TABS
1.0000 | ORAL_TABLET | Freq: Three times a day (TID) | ORAL | 0 refills | Status: DC | PRN
  Filled 2023-10-16: qty 90, 30d supply, fill #0

## 2023-09-14 ENCOUNTER — Other Ambulatory Visit (HOSPITAL_BASED_OUTPATIENT_CLINIC_OR_DEPARTMENT_OTHER): Payer: Self-pay

## 2023-09-15 ENCOUNTER — Other Ambulatory Visit (HOSPITAL_BASED_OUTPATIENT_CLINIC_OR_DEPARTMENT_OTHER): Payer: Self-pay

## 2023-09-15 MED ORDER — OXYCODONE-ACETAMINOPHEN 10-325 MG PO TABS
1.0000 | ORAL_TABLET | Freq: Three times a day (TID) | ORAL | 0 refills | Status: DC | PRN
  Filled 2023-09-15: qty 90, 30d supply, fill #0

## 2023-09-25 ENCOUNTER — Other Ambulatory Visit: Payer: Self-pay

## 2023-09-25 ENCOUNTER — Other Ambulatory Visit: Payer: Self-pay | Admitting: Family Medicine

## 2023-09-25 ENCOUNTER — Other Ambulatory Visit (HOSPITAL_BASED_OUTPATIENT_CLINIC_OR_DEPARTMENT_OTHER): Payer: Self-pay

## 2023-09-25 MED ORDER — AMPHETAMINE-DEXTROAMPHETAMINE 30 MG PO TABS
30.0000 mg | ORAL_TABLET | Freq: Two times a day (BID) | ORAL | 0 refills | Status: DC
  Filled 2023-09-25 – 2023-11-21 (×2): qty 60, 30d supply, fill #0

## 2023-09-25 MED ORDER — AMPHETAMINE-DEXTROAMPHETAMINE 30 MG PO TABS
30.0000 mg | ORAL_TABLET | Freq: Two times a day (BID) | ORAL | 0 refills | Status: DC
  Filled 2023-09-25: qty 60, 30d supply, fill #0

## 2023-09-25 MED ORDER — AMPHETAMINE-DEXTROAMPHETAMINE 30 MG PO TABS
30.0000 mg | ORAL_TABLET | Freq: Two times a day (BID) | ORAL | 0 refills | Status: DC
  Filled 2023-09-25 – 2023-10-23 (×2): qty 60, 30d supply, fill #0

## 2023-09-25 NOTE — Telephone Encounter (Signed)
 Copied from CRM [id number]. Topic: Clinical - Medication Refill >> Sep 25, 2023 10:53 AM Alcus Dad wrote: Most Recent Primary Care Visit:  Provider: Kristian Covey  Department: [facility]  Visit Type: PHYSICAL  Date: 08/30/2023  Medication: amphetamine-dextroamphetamine (ADDERALL) 30 MG tablet  Has the patient contacted their pharmacy? Yes (Agent: If no, request that the patient contact the pharmacy for the refill. If patient does not wish to contact the pharmacy document the reason why and proceed with request.) (Agent: If yes, when and what did the pharmacy advise?)  Is this the correct pharmacy for this prescription? Yes If no, delete pharmacy and type the correct one.  This is the patient's preferred pharmacy:   [facility] [address] [place] [address] Phone: [phone] Fax: [phone]   Has the prescription been filled recently? Yes  Is the patient out of the medication? No  Has the patient been seen for an appointment in the last year OR does the patient have an upcoming appointment? Yes  Can we respond through MyChart? Yes  Agent: Please be advised that Rx refills may take up to 3 business days. We ask that you follow-up with your pharmacy.

## 2023-09-26 ENCOUNTER — Other Ambulatory Visit: Payer: Self-pay

## 2023-10-16 ENCOUNTER — Other Ambulatory Visit (HOSPITAL_BASED_OUTPATIENT_CLINIC_OR_DEPARTMENT_OTHER): Payer: Self-pay

## 2023-10-23 ENCOUNTER — Other Ambulatory Visit (HOSPITAL_BASED_OUTPATIENT_CLINIC_OR_DEPARTMENT_OTHER): Payer: Self-pay

## 2023-11-13 ENCOUNTER — Other Ambulatory Visit (HOSPITAL_BASED_OUTPATIENT_CLINIC_OR_DEPARTMENT_OTHER): Payer: Self-pay

## 2023-11-21 ENCOUNTER — Other Ambulatory Visit (HOSPITAL_BASED_OUTPATIENT_CLINIC_OR_DEPARTMENT_OTHER): Payer: Self-pay

## 2023-12-13 ENCOUNTER — Other Ambulatory Visit: Payer: Self-pay

## 2023-12-13 ENCOUNTER — Other Ambulatory Visit: Payer: Self-pay | Admitting: Family Medicine

## 2023-12-13 ENCOUNTER — Other Ambulatory Visit (HOSPITAL_BASED_OUTPATIENT_CLINIC_OR_DEPARTMENT_OTHER): Payer: Self-pay

## 2023-12-13 MED ORDER — AMPHETAMINE-DEXTROAMPHETAMINE 30 MG PO TABS
30.0000 mg | ORAL_TABLET | Freq: Two times a day (BID) | ORAL | 0 refills | Status: DC
  Filled 2023-12-13 – 2024-01-16 (×2): qty 60, 30d supply, fill #0

## 2023-12-13 MED ORDER — AMPHETAMINE-DEXTROAMPHETAMINE 30 MG PO TABS
30.0000 mg | ORAL_TABLET | Freq: Two times a day (BID) | ORAL | 0 refills | Status: DC
  Filled 2023-12-13 – 2024-02-13 (×2): qty 60, 30d supply, fill #0

## 2023-12-13 MED ORDER — AMPHETAMINE-DEXTROAMPHETAMINE 30 MG PO TABS
30.0000 mg | ORAL_TABLET | Freq: Two times a day (BID) | ORAL | 0 refills | Status: DC
  Filled 2023-12-13 – 2023-12-19 (×2): qty 60, 30d supply, fill #0

## 2023-12-14 ENCOUNTER — Other Ambulatory Visit (HOSPITAL_BASED_OUTPATIENT_CLINIC_OR_DEPARTMENT_OTHER): Payer: Self-pay

## 2023-12-14 MED ORDER — OXYCODONE-ACETAMINOPHEN 10-325 MG PO TABS
1.0000 | ORAL_TABLET | Freq: Three times a day (TID) | ORAL | 0 refills | Status: DC | PRN
  Filled 2023-12-14: qty 90, 30d supply, fill #0

## 2023-12-14 MED ORDER — OXYCODONE-ACETAMINOPHEN 10-325 MG PO TABS
1.0000 | ORAL_TABLET | Freq: Three times a day (TID) | ORAL | 0 refills | Status: DC | PRN
  Filled 2024-02-15: qty 90, 30d supply, fill #0

## 2023-12-14 MED ORDER — OXYCODONE-ACETAMINOPHEN 10-325 MG PO TABS
1.0000 | ORAL_TABLET | Freq: Three times a day (TID) | ORAL | 0 refills | Status: DC | PRN
  Filled 2024-01-12: qty 90, 30d supply, fill #0

## 2023-12-19 ENCOUNTER — Other Ambulatory Visit (HOSPITAL_BASED_OUTPATIENT_CLINIC_OR_DEPARTMENT_OTHER): Payer: Self-pay

## 2023-12-19 ENCOUNTER — Other Ambulatory Visit: Payer: Self-pay

## 2024-01-09 ENCOUNTER — Other Ambulatory Visit: Payer: Self-pay | Admitting: Family Medicine

## 2024-01-09 ENCOUNTER — Other Ambulatory Visit (HOSPITAL_BASED_OUTPATIENT_CLINIC_OR_DEPARTMENT_OTHER): Payer: Self-pay

## 2024-01-12 ENCOUNTER — Other Ambulatory Visit (HOSPITAL_BASED_OUTPATIENT_CLINIC_OR_DEPARTMENT_OTHER): Payer: Self-pay

## 2024-01-16 ENCOUNTER — Other Ambulatory Visit (HOSPITAL_BASED_OUTPATIENT_CLINIC_OR_DEPARTMENT_OTHER): Payer: Self-pay

## 2024-02-07 ENCOUNTER — Other Ambulatory Visit (HOSPITAL_BASED_OUTPATIENT_CLINIC_OR_DEPARTMENT_OTHER): Payer: Self-pay

## 2024-02-13 ENCOUNTER — Other Ambulatory Visit (HOSPITAL_COMMUNITY): Payer: Self-pay

## 2024-02-13 ENCOUNTER — Other Ambulatory Visit (HOSPITAL_BASED_OUTPATIENT_CLINIC_OR_DEPARTMENT_OTHER): Payer: Self-pay

## 2024-02-14 ENCOUNTER — Other Ambulatory Visit (HOSPITAL_BASED_OUTPATIENT_CLINIC_OR_DEPARTMENT_OTHER): Payer: Self-pay

## 2024-02-15 ENCOUNTER — Ambulatory Visit: Admitting: Family Medicine

## 2024-02-15 ENCOUNTER — Other Ambulatory Visit (HOSPITAL_BASED_OUTPATIENT_CLINIC_OR_DEPARTMENT_OTHER): Payer: Self-pay

## 2024-02-15 NOTE — Progress Notes (Deleted)
 PATIENT CHECK-IN and HEALTH RISK ASSESSMENT QUESTIONNAIRE:  -completed by phone/video for upcoming Medicare Preventive Visit  -PLEASE SELECT NOT IN PERSON for the method of visit.   FIRST check to see if the patient completed the online questionnaire - if so, this can be found under the rooming tab, then go to the questionnaires tab. Some of the questions are the same and you can use the answers to complete all of the bold questions below before calling the patient. Question #s below: 6 (fall risk screening), under habits # 1, 2, 3, 8 and 9,  under everyday activities #2, 3 and 8.   Pre-Visit Check-in: 1)Vitals (height, wt, BP, etc) - record in vitals section for visit on day of visit Request home vitals (wt, BP, etc.) and enter into vitals, THEN update Vital Signs SmartPhrase below at the top of the HPI. See below.  2)Review and Update Medications, Allergies PMH, Surgeries, Social history in Epic 3)Hospitalizations in the last year with date/reason? ***  4)Review and Update Care Team (patient's specialists) in Epic 5) Complete PHQ9 in Epic  6) Complete Fall Screening in Epic 7)Review all Health Maintenance Due and order if not done.  Medicare Wellness Patient Questionnaire:  Answer theses question about your habits: How often do you have a drink containing alcohol?*** How many drinks containing alcohol do you have on a typical day when you are drinking?*** How often do you have six or more drinks on one occasion?*** Have you ever smoked?*** Quit date if applicable? ***  How many packs a day do/did you smoke? *** Do you use smokeless tobacco?*** Do you use an illicit drugs?*** On average, how many days per week do you engage in moderate to strenuous exercise (like a brisk walk)?*** On average, how many minutes do you engage in exercise at this level?*** Are you sexually active? ***Number of partners?*** Typical breakfast**** Typical lunch*** Typical dinner*** Typical  snacks:****  Beverages: ***  Answer theses question about your everyday activities: Can you perform most household chores?*** Are you deaf or have significant trouble hearing?*** Do you feel that you have a problem with memory?*** Do you feel safe at home?*** Last dentist visit?*** 8. Do you have any difficulty performing your everyday activities?*** Are you having any difficulty walking, taking medications on your own, and or difficulty managing daily home needs?*** Do you have difficulty walking or climbing stairs?*** Do you have difficulty dressing or bathing?*** Do you have difficulty doing errands alone such as visiting a doctor's office or shopping?*** Do you currently have any difficulty preparing food and eating?*** Do you currently have any difficulty using the toilet?*** Do you have any difficulty managing your finances?*** Do you have any difficulties with housekeeping of managing your housekeeping?***   Do you have Advanced Directives in place (Living Will, Healthcare Power or Attorney)? ***   Last eye Exam and location?***   Do you currently use prescribed or non-prescribed narcotic or opioid pain medications?***  Do you have a history or close family history of breast, ovarian, tubal or peritoneal cancer or a family member with BRCA (breast cancer susceptibility 1 and 2) gene mutations?  ***Request home vitals (wt, BP, etc.) and enter into vitals, THEN update Vital Signs SmartPhrase below at the top of the HPI. See below.   Nurse/Assistant Credentials/time stamp:    ----------------------------------------------------------------------------------------------------------------------------------------------------------------------------------------------------------------------  Because this visit was a virtual/telehealth visit, some criteria may be missing or patient reported. Any vitals not documented were not able to be obtained and vitals that  have been  documented are patient reported.    MEDICARE ANNUAL PREVENTIVE VISIT WITH PROVIDER: (Welcome to Medicare, initial annual wellness or annual wellness exam)  Virtual Visit via Video***Phone Note  I connected with [name] Sherman on 02/15/24 by phone *** a video enabled telemedicine application and verified that I am speaking with the correct person using two identifiers.  Location patient: home Location provider:work or home office Persons participating in the virtual visit: patient, provider  Concerns and/or follow up today:   See HM section in Epic for other details of completed HM.    ROS: negative for report of fevers, unintentional weight loss, vision changes, vision loss, hearing loss or change, chest pain, sob, hemoptysis, melena, hematochezia, hematuria, falls, bleeding or bruising, thoughts of suicide or self harm, memory loss  Patient-completed extensive health risk assessment - reviewed and discussed with the patient: See Health Risk Assessment completed with patient prior to the visit either above or in recent phone note. This was reviewed in detailed with the patient today and appropriate recommendations, orders and referrals were placed as needed per Summary below and patient instructions.   Review of Medical History: -PMH, PSH, Family History and current specialty and care providers reviewed and updated and listed below   Patient Care Team: Micheal Wolm ORN, MD as PCP - General (Family Medicine)   Past Medical History:  Diagnosis Date   ADD (attention deficit disorder)    Anemia    Anxiety    Arthritis    shoulder, right (arthritis, tendonitis, bursitis)   Chronic low back pain    Complication of anesthesia    Dyspnea    Fatty liver    GERD (gastroesophageal reflux disease)    History of cervical cancer    s/p  laser ablation of cervix 1980's   History of COVID-19 07/2019   History of kidney stones    Hyperlipidemia    Hypertension    Hypothyroidism     IBS (irritable bowel syndrome)    MDD (major depressive disorder)    Migraine    OSA (obstructive sleep apnea)    pt used cpap up until 2013 states lost wt and did not need anymore   PONV (postoperative nausea and vomiting)    Restless legs    Sleep apnea    wears CPAP   Type 2 diabetes mellitus (HCC)    Urgency of urination    Vertigo    Vitamin D  deficiency    Vulvar cyst     Past Surgical History:  Procedure Laterality Date   BUNIONECTOMY Right 2004   COLONOSCOPY     CYSTOSCOPY W/ RETROGRADES Bilateral 05/26/2015   Procedure: CYSTOSCOPY WITH RETROGRADE PYELOGRAM;  Surgeon: Donnice Brooks, MD;  Location: [facility];  Service: Urology;  Laterality: Bilateral;   CYSTOSCOPY W/ URETERAL STENT REMOVAL Left 05/26/2015   Procedure: CYSTOSCOPY WITH STENT REMOVAL;  Surgeon: Donnice Brooks, MD;  Location: [facility];  Service: Urology;  Laterality: Left;   CYSTOSCOPY WITH RETROGRADE PYELOGRAM, URETEROSCOPY AND STENT PLACEMENT Left 05/01/2015   Procedure: CYSTOSCOPY WITH RETROGRADE PYELOGRAM AND STENT PLACEMENT;  Surgeon: Donnice Brooks, MD;  Location: [facility];  Service: Urology;  Laterality: Left;   CYSTOSCOPY WITH STENT PLACEMENT Bilateral 05/26/2015   Procedure: CYSTOSCOPY WITH STENT PLACEMENT;  Surgeon: Donnice Brooks, MD;  Location: [facility];  Service: Urology;  Laterality: Bilateral;   CYSTOSCOPY WITH URETEROSCOPY Bilateral 05/26/2015   Procedure: CYSTOSCOPY WITH URETEROSCOPY;  Surgeon: Donnice Brooks, MD;  Location:  [facility];  Service: Urology;  Laterality: Bilateral;   CYSTOSCOPY/URETEROSCOPY/HOLMIUM LASER/STENT PLACEMENT Right 06/09/2015   Procedure: CYSTOSCOPY/URETEROSCOPY/STENT PLACEMENT REMOVAL LEFT URETERAL STENT;  Surgeon: Donnice Brooks, MD;  Location: [facility];  Service: Urology;  Laterality: Right;   DIAGNOSTIC LAPAROSCOPY     HOLMIUM LASER APPLICATION Left 05/26/2015   Procedure: HOLMIUM LASER  APPLICATION;  Surgeon: Donnice Brooks, MD;  Location: [facility];  Service: Urology;  Laterality: Left;   HOLMIUM LASER APPLICATION Right 06/09/2015   Procedure: HOLMIUM LASER APPLICATION;  Surgeon: Donnice Brooks, MD;  Location: [facility];  Service: Urology;  Laterality: Right;   INSERTION OF MESH N/A 09/05/2016   Procedure: INSERTION OF MESH;  Surgeon: Mitzie DELENA Freund, MD;  Location: [facility];  Service: General;  Laterality: N/A;   LAPAROSCOPIC ASSISTED VAGINAL HYSTERECTOMY  04/23/2002   @[facility]   by Dr Horacio;  With Left Salpingoophorectomy/  Anterior Repair/  Tension Free Tape Sling placement   LAPAROSCOPIC GASTRIC SLEEVE RESECTION N/A 09/07/2021   Procedure: LAPAROSCOPIC GASTRIC SLEEVE RESECTION;  Surgeon: Freund Mitzie DELENA, MD;  Location: [facility];  Service: General;  Laterality: N/A;   LASER ABLATION OF THE CERVIX  x2  1980's   POSTERIOR LAMINECTOMY / DECOMPRESSION LUMBAR SPINE  02/03/2020   @[facility]   Dr Colon;   L4--5 and fusion   POSTERIOR LUMBAR FUSION  01/21/2019   @[facility]  by Dr Colon;   L5--S1   TUBAL LIGATION  1980's   UPPER GI ENDOSCOPY N/A 09/07/2021   Procedure: UPPER GI ENDOSCOPY;  Surgeon: Freund Mitzie DELENA, MD;  Location: [facility];  Service: General;  Laterality: N/A;   VENTRAL HERNIA REPAIR N/A 09/05/2016   Procedure: LAPAROSCOPIC VENTRAL HERNIA;  Surgeon: Mitzie DELENA Freund, MD;  Location: [facility];  Service: General;  Laterality: N/A;    Social History   Socioeconomic History   Marital status: Married    Spouse name: Not on file   Number of children: Not on file   Years of education: Not on file   Highest education level: Not on file  Occupational History   Not on file  Tobacco Use   Smoking status: Never   Smokeless tobacco: Never  Vaping Use   Vaping status: Never Used  Substance and Sexual Activity   Alcohol use: Yes    Comment: socially   Drug use: Never   Sexual activity: Yes  Other Topics Concern   Not on file  Social History Narrative   Not on file    Social Drivers of Health   Financial Resource Strain: Low Risk  (06/08/2021)   Overall Financial Resource Strain (CARDIA)    Difficulty of Paying Living Expenses: Not hard at all  Food Insecurity: No Food Insecurity (06/30/2022)   Hunger Vital Sign    Worried About Running Out of Food in the Last Year: Never true    Ran Out of Food in the Last Year: Never true  Transportation Needs: No Transportation Needs (06/30/2022)   PRAPARE - Administrator, Civil Service (Medical): No    Lack of Transportation (Non-Medical): No  Physical Activity: Sufficiently Active (06/30/2022)   Exercise Vital Sign    Days of Exercise per Week: 7 days    Minutes of Exercise per Session: 30 min  Stress: No Stress Concern Present (06/30/2022)   [employer] of Occupational Health - Occupational Stress Questionnaire    Feeling of Stress : Not at all  Social Connections: Socially Integrated (06/30/2022)   Social Connection and Isolation Panel  Frequency of Communication with Friends and Family: More than three times a week    Frequency of Social Gatherings with Friends and Family: More than three times a week    Attends Religious Services: More than 4 times per year    Active Member of [employer] or Organizations: Yes    Attends Engineer, structural: More than 4 times per year    Marital Status: Married  Catering manager Violence: Not At Risk (06/30/2022)   Humiliation, Afraid, Rape, and Kick questionnaire    Fear of Current or Ex-Partner: No    Emotionally Abused: No    Physically Abused: No    Sexually Abused: No    Family History  Problem Relation Age of Onset   Alcohol abuse Mother    Lung cancer Mother    Colon cancer Mother    Arthritis Father    Hyperlipidemia Father    Heart disease Father    Hypertension Father    COPD Father    Kidney disease Paternal Uncle    Cancer Maternal Grandmother        colon   Colon cancer Maternal Grandmother    Rectal cancer Maternal  Grandmother    Cancer Paternal Grandmother        breast   Kidney cancer Sister        lung cancer   Cirrhosis Sister    Esophageal cancer Neg Hx    Stomach cancer Neg Hx     Current Outpatient Medications on File Prior to Visit  Medication Sig Dispense Refill   Accu-Chek Softclix Lancets lancets USE AS DIRECTED UP TO FOUR TIMES DAILY 100 each 3   amoxicillin -clavulanate (AUGMENTIN ) 875-125 MG tablet Take 1 tablet by mouth every 12 (twelve) hours. 14 tablet 0   amphetamine -dextroamphetamine  (ADDERALL) 30 MG tablet Take 1 tablet by mouth 2 (two) times daily. 60 tablet 0   amphetamine -dextroamphetamine  (ADDERALL) 30 MG tablet Take 1 tablet by mouth 2 (two) times daily. 60 tablet 0   amphetamine -dextroamphetamine  (ADDERALL) 30 MG tablet Take 1 tablet by mouth 2 (two) times daily. 60 tablet 0   amphetamine -dextroamphetamine  (ADDERALL) 30 MG tablet Take 1 tablet by mouth 2 (two) times daily. 60 tablet 0   atorvastatin  (LIPITOR) 10 MG tablet TAKE 1 TABLET(10 MG) BY MOUTH DAILY 90 tablet 0   blood glucose meter kit and supplies Dispense based on patient and insurance preference. Use up to four times daily as directed. (FOR ICD-10 E10.9, E11.9). 1 each 0   CALCIUM  CITRATE PO Take 3 tablets by mouth 2 (two) times daily.     cetirizine (ZYRTEC) 10 MG tablet Take 10 mg by mouth in the morning.     citalopram  (CELEXA ) 20 MG tablet TAKE 1 TABLET(20 MG) BY MOUTH DAILY 90 tablet 0   cyclobenzaprine  (FLEXERIL ) 10 MG tablet Take 1 tablet (10 mg total) by mouth 2 (two) times daily. 180 tablet 3   furosemide  (LASIX ) 20 MG tablet TAKE 1 TABLET BY MOUTH ONCE A DAY AS NEEDED FOR SWELLING 30 tablet 1   glucose blood (ACCU-CHEK GUIDE) test strip Use to test blood sugars 3 times a day. 300 each 3   HYDROcodone -acetaminophen  (NORCO/VICODIN) 5-325 MG tablet Take 1 tablet by mouth every 4 (four) hours as needed. 6 tablet 0   insulin  lispro (HUMALOG  KWIKPEN) 200 UNIT/ML KwikPen Inject 10 Units into the skin 2 (two)  times daily with breakfast and supper for glucose over 200. 3 mL 1   Insulin  Pen Needle (BD PEN NEEDLE  NANO 2ND GEN) 32G X 4 MM MISC USE WITH INSULIN  PEN TO INJECT INSULIN  100 each 1   Insulin  Pen Needle (BD PEN NEEDLE NANO 2ND GEN) 32G X 4 MM MISC Use as directed 100 each 0   levothyroxine  (SYNTHROID ) 50 MCG tablet TAKE 1 TABLET(50 MCG) BY MOUTH DAILY 90 tablet 0   losartan  (COZAAR ) 50 MG tablet TAKE 1 TABLET(50 MG) BY MOUTH DAILY 90 tablet 0   mupirocin  ointment (BACTROBAN ) 2 % Apply 1 Application topically 2 (two) times daily. 22 g 0   NON FORMULARY Take 1 tablet by mouth See admin instructions. unnamed Bariatric vitamin- Take 1 tablet by mouth once a day     nortriptyline  (PAMELOR ) 50 MG capsule Take 1 capsule (50 mg total) by mouth 2 (two) times daily. 180 capsule 3   nortriptyline  (PAMELOR ) 50 MG capsule Take 1 capsule (50 mg total) by mouth 2 (two) times daily. 180 capsule 3   nystatin  cream (MYCOSTATIN ) Apply 1 Application topically 2 (two) times daily. 30 g 1   ondansetron  (ZOFRAN -ODT) 4 MG disintegrating tablet Take 1 tablet (4 mg total) by mouth every 8 (eight) hours as needed. 12 tablet 0   oxyCODONE -acetaminophen  (PERCOCET) 10-325 MG tablet Take 1 tablet by mouth every 8 (eight) hours as needed. 90 tablet 0   oxyCODONE -acetaminophen  (PERCOCET) 10-325 MG tablet Take 1 tablet by mouth every 8 (eight) hours as needed. 90 tablet 0   oxyCODONE -acetaminophen  (PERCOCET) 10-325 MG tablet Take 1 tablet by mouth every 8 (eight) hours as needed. 90 tablet 0   oxyCODONE -acetaminophen  (PERCOCET) 10-325 MG tablet Take 1 tablet by mouth every 8 (eight) hours as needed. (08-14-23) 90 tablet 0   oxyCODONE -acetaminophen  (PERCOCET) 10-325 MG tablet Take 1 tablet by mouth every 8 (eight) hours as needed. (07-15-23) 90 tablet 0   oxyCODONE -acetaminophen  (PERCOCET) 10-325 MG tablet Take 1 tablet by mouth every 8 (eight) hours as needed. 90 tablet 0   oxyCODONE -acetaminophen  (PERCOCET) 10-325 MG tablet Take 1  tablet by mouth every 8 (eight) hours as needed. 90 tablet 0   oxyCODONE -acetaminophen  (PERCOCET) 10-325 MG tablet Take 1 tablet by mouth every 8 (eight) hours as needed. 90 tablet 0   oxyCODONE -acetaminophen  (PERCOCET) 10-325 MG tablet Take 1 tablet by mouth every 8 (eight) hours as needed. 90 tablet 0   oxyCODONE -acetaminophen  (PERCOCET) 10-325 MG tablet Take 1 tablet by mouth every 8 (eight) hours as needed. 90 tablet 0   SUMAtriptan  (IMITREX ) 100 MG tablet May repeat in 2 hours if headache persists or recurs.  Do not take more than 2 in 24 hours 10 tablet 1   tamsulosin  (FLOMAX ) 0.4 MG CAPS capsule Take 1 capsule (0.4 mg total) by mouth daily. 30 capsule 0   [DISCONTINUED] pantoprazole  (PROTONIX ) 40 MG tablet TAKE 1 TABLET(40 MG) BY MOUTH TWICE DAILY 60 tablet 3   No current facility-administered medications on file prior to visit.    Allergies  Allergen Reactions   Keflex  [Cephalexin ] Swelling and Other (See Comments)    Tongue swelling       Physical Exam Vitals requested from patient and listed below if patient had equipment and was able to obtain at home for this virtual visit: There were no vitals filed for this visit. Estimated body mass index is 34.33 kg/m as calculated from the following:   Height as of 09/10/23: 5' 4 (1.626 m).   Weight as of 09/10/23: 200 lb 0.2 oz (90.7 kg).  EKG (optional): deferred due to virtual visit  GENERAL: alert, oriented,  no acute distress detected, full vision exam deferred due to pandemic and/or virtual encounter  *** HEENT: atraumatic, conjunttiva clear, no obvious abnormalities on inspection of external nose and ears  NECK: normal movements of the head and neck  LUNGS: on inspection no signs of respiratory distress, breathing rate appears normal, no obvious gross SOB, gasping or wheezing  CV: no obvious cyanosis  MS: moves all visible extremities without noticeable abnormality  PSYCH/NEURO: pleasant and cooperative, no obvious  depression or anxiety, speech and thought processing grossly intact, Cognitive function grossly intact  Flowsheet Row Office Visit from 08/27/2021 in [facility] [place]  PHQ-9 Total Score 8        09/14/2022    4:16 PM 06/30/2022   11:01 AM 08/27/2021    9:45 AM 08/17/2021    9:22 AM 06/08/2021   10:55 AM  Depression screen PHQ 2/9  Decreased Interest 0 0 1 1 0  Down, Depressed, Hopeless 0 0 1 1 0  PHQ - 2 Score 0 0 2 2 0  Altered sleeping   0 0   Tired, decreased energy   1 0   Change in appetite   1 1   Feeling bad or failure about yourself    0 0   Trouble concentrating   2 0   Moving slowly or fidgety/restless   2 2   Suicidal thoughts   0 0   PHQ-9 Score   8 5   Difficult doing work/chores   Somewhat difficult         10/25/2021   10:52 PM 10/26/2021    7:23 AM 06/30/2022   11:02 AM 07/02/2022    1:02 PM 09/14/2022    4:16 PM  Fall Risk  Falls in the past year?   0  0  Was there an injury with Fall?   0  0  Fall Risk Category Calculator   0  0  Fall Risk Category (Retired)   Low     (RETIRED) Patient Fall Risk Level Low fall risk  Low fall risk  Low fall risk  Low fall risk    Patient at Risk for Falls Due to   No Fall Risks  No Fall Risks  Fall risk Follow up   Falls prevention discussed   Falls evaluation completed     Data saved with a previous flowsheet row definition     SUMMARY AND PLAN:  No diagnosis found.  Visit coding *** [id number] (annual wellness visit -initial); G0439 (annual wellness subsequent); G0402 Welcome to Medicare(initial preventive physical exam)   Discussed applicable health maintenance/preventive health measures and advised and referred or ordered per patient preferences:  Health Maintenance  Topic Date Due   FOOT EXAM  Never done   Pneumococcal Vaccine: 50+ Years (1 of 2 - PCV) Never done   Cervical Cancer Screening (HPV/Pap Cotest)  12/05/2015   Zoster Vaccines- Shingrix (2 of 2) 05/11/2021   COVID-19 Vaccine (6 -  2024-25 season) 02/26/2023   Medicare Annual Wellness (AWV)  07/01/2023   OPHTHALMOLOGY EXAM  07/09/2023   INFLUENZA VACCINE  01/26/2024   HEMOGLOBIN A1C  03/01/2024   Diabetic kidney evaluation - Urine ACR  08/29/2024   Diabetic kidney evaluation - eGFR measurement  09/09/2024   MAMMOGRAM  04/24/2025   Colonoscopy  05/06/2026   DTaP/Tdap/Td (3 - Td or Tdap) 03/21/2029   Hepatitis C Screening  Completed   HIV Screening  Completed   Hepatitis B Vaccines 19-59 Average Risk  Aged Out   HPV VACCINES  Aged Out   Meningococcal B Vaccine  Aged [employer] and counseling on the following was provided based on the above review of health and a plan/checklist for the patient, along with additional information discussed, was provided for the patient in the patient instructions :  -Advised on importance of completing advanced directives, discussed options for completing and provided information in patient instructions as well -Provided counseling and plan for difficulty hearing  -Provided counseling and plan for increased risk of falling if applicable per above screening. Reviewed and demonstrated safe balance exercises that can be done at home to improve balance and discussed exercise guidelines for adults with include balance exercises at least 3 days per week.  -Advised and counseled on a healthy lifestyle - including the importance of a healthy diet, regular physical activity, social connections and stress management. -Reviewed patient's current diet. Advised and counseled on a whole foods based healthy diet. A summary of a healthy diet was provided in the Patient Instructions.  -reviewed patient's current physical activity level and discussed exercise guidelines for adults. Discussed community resources and ideas for safe exercise at home to assist in meeting exercise guideline recommendations in a safe and healthy way.  -Advise yearly dental visits at minimum and regular eye  exams -Advised and counseled on alcohol safe limits, risks/ tobacco use, risks of smoking and offered counseling/help, drug, opoid use/misuse   Follow up: see patient instructions     There are no Patient Instructions on file for this visit.  Chiquita JONELLE Cramp, DO

## 2024-02-20 ENCOUNTER — Other Ambulatory Visit (HOSPITAL_BASED_OUTPATIENT_CLINIC_OR_DEPARTMENT_OTHER): Payer: Self-pay

## 2024-02-20 MED ORDER — PREDNISONE 10 MG PO TABS
10.0000 mg | ORAL_TABLET | Freq: Three times a day (TID) | ORAL | 0 refills | Status: AC
  Filled 2024-02-20: qty 42, 14d supply, fill #0

## 2024-02-24 ENCOUNTER — Other Ambulatory Visit (HOSPITAL_BASED_OUTPATIENT_CLINIC_OR_DEPARTMENT_OTHER): Payer: Self-pay

## 2024-03-01 ENCOUNTER — Other Ambulatory Visit (HOSPITAL_BASED_OUTPATIENT_CLINIC_OR_DEPARTMENT_OTHER): Payer: Self-pay

## 2024-03-05 ENCOUNTER — Other Ambulatory Visit (HOSPITAL_BASED_OUTPATIENT_CLINIC_OR_DEPARTMENT_OTHER): Payer: Self-pay

## 2024-03-05 MED ORDER — OXYCODONE-ACETAMINOPHEN 10-325 MG PO TABS
1.0000 | ORAL_TABLET | ORAL | 0 refills | Status: AC
  Filled 2024-05-27: qty 67, 22d supply, fill #0
  Filled 2024-05-27: qty 23, 8d supply, fill #0

## 2024-03-05 MED ORDER — OXYCODONE-ACETAMINOPHEN 10-325 MG PO TABS
1.0000 | ORAL_TABLET | Freq: Three times a day (TID) | ORAL | 0 refills | Status: DC | PRN
  Filled 2024-03-18 – 2024-03-19 (×2): qty 90, 30d supply, fill #0

## 2024-03-05 MED ORDER — OXYCODONE-ACETAMINOPHEN 10-325 MG PO TABS
1.0000 | ORAL_TABLET | Freq: Three times a day (TID) | ORAL | 0 refills | Status: DC | PRN
  Filled 2024-04-19: qty 90, 30d supply, fill #0

## 2024-03-12 ENCOUNTER — Other Ambulatory Visit: Payer: Self-pay | Admitting: Family Medicine

## 2024-03-12 ENCOUNTER — Other Ambulatory Visit (HOSPITAL_BASED_OUTPATIENT_CLINIC_OR_DEPARTMENT_OTHER): Payer: Self-pay

## 2024-03-13 ENCOUNTER — Other Ambulatory Visit (HOSPITAL_BASED_OUTPATIENT_CLINIC_OR_DEPARTMENT_OTHER): Payer: Self-pay

## 2024-03-13 MED ORDER — AMPHETAMINE-DEXTROAMPHETAMINE 30 MG PO TABS
30.0000 mg | ORAL_TABLET | Freq: Two times a day (BID) | ORAL | 0 refills | Status: DC
  Filled 2024-03-13: qty 60, 30d supply, fill #0

## 2024-03-13 NOTE — Telephone Encounter (Signed)
Refilled once.  Needs office follow-up.  [name] MD [facility]

## 2024-03-18 ENCOUNTER — Other Ambulatory Visit (HOSPITAL_BASED_OUTPATIENT_CLINIC_OR_DEPARTMENT_OTHER): Payer: Self-pay

## 2024-03-18 ENCOUNTER — Other Ambulatory Visit (HOSPITAL_COMMUNITY): Payer: Self-pay

## 2024-03-19 ENCOUNTER — Other Ambulatory Visit (HOSPITAL_BASED_OUTPATIENT_CLINIC_OR_DEPARTMENT_OTHER): Payer: Self-pay

## 2024-03-19 ENCOUNTER — Other Ambulatory Visit: Payer: Self-pay

## 2024-03-20 ENCOUNTER — Other Ambulatory Visit (HOSPITAL_BASED_OUTPATIENT_CLINIC_OR_DEPARTMENT_OTHER): Payer: Self-pay

## 2024-04-01 DIAGNOSIS — M7061 Trochanteric bursitis, right hip: Secondary | ICD-10-CM | POA: Diagnosis not present

## 2024-04-03 ENCOUNTER — Encounter (HOSPITAL_COMMUNITY): Payer: Self-pay | Admitting: *Deleted

## 2024-04-04 DIAGNOSIS — Z8639 Personal history of other endocrine, nutritional and metabolic disease: Secondary | ICD-10-CM | POA: Diagnosis not present

## 2024-04-04 DIAGNOSIS — M75121 Complete rotator cuff tear or rupture of right shoulder, not specified as traumatic: Secondary | ICD-10-CM | POA: Diagnosis not present

## 2024-04-05 ENCOUNTER — Encounter: Payer: Self-pay | Admitting: Family Medicine

## 2024-04-05 ENCOUNTER — Ambulatory Visit (INDEPENDENT_AMBULATORY_CARE_PROVIDER_SITE_OTHER): Admitting: Family Medicine

## 2024-04-05 ENCOUNTER — Other Ambulatory Visit: Payer: Self-pay

## 2024-04-05 ENCOUNTER — Other Ambulatory Visit: Payer: Self-pay | Admitting: Family Medicine

## 2024-04-05 ENCOUNTER — Other Ambulatory Visit (HOSPITAL_BASED_OUTPATIENT_CLINIC_OR_DEPARTMENT_OTHER): Payer: Self-pay

## 2024-04-05 VITALS — BP 142/80 | HR 90 | Temp 98.2°F | Wt 206.2 lb

## 2024-04-05 DIAGNOSIS — E1165 Type 2 diabetes mellitus with hyperglycemia: Secondary | ICD-10-CM | POA: Diagnosis not present

## 2024-04-05 DIAGNOSIS — I10 Essential (primary) hypertension: Secondary | ICD-10-CM

## 2024-04-05 DIAGNOSIS — E785 Hyperlipidemia, unspecified: Secondary | ICD-10-CM

## 2024-04-05 DIAGNOSIS — E039 Hypothyroidism, unspecified: Secondary | ICD-10-CM

## 2024-04-05 MED ORDER — LOSARTAN POTASSIUM 50 MG PO TABS
50.0000 mg | ORAL_TABLET | Freq: Every day | ORAL | 3 refills | Status: AC
  Filled 2024-04-05: qty 90, 90d supply, fill #0
  Filled 2024-07-11: qty 90, 90d supply, fill #1

## 2024-04-05 MED ORDER — LEVOTHYROXINE SODIUM 50 MCG PO TABS
50.0000 ug | ORAL_TABLET | Freq: Every day | ORAL | 3 refills | Status: AC
  Filled 2024-04-05: qty 90, 90d supply, fill #0
  Filled 2024-06-01 – 2024-07-11 (×2): qty 90, 90d supply, fill #1

## 2024-04-05 MED ORDER — CITALOPRAM HYDROBROMIDE 20 MG PO TABS
20.0000 mg | ORAL_TABLET | Freq: Every day | ORAL | 3 refills | Status: AC
  Filled 2024-04-05 – 2024-05-06 (×2): qty 90, 90d supply, fill #0

## 2024-04-05 MED ORDER — ATORVASTATIN CALCIUM 10 MG PO TABS
10.0000 mg | ORAL_TABLET | Freq: Every day | ORAL | 3 refills | Status: AC
  Filled 2024-04-05: qty 90, 90d supply, fill #0
  Filled 2024-07-11: qty 90, 90d supply, fill #1

## 2024-04-05 NOTE — Patient Instructions (Signed)
 Set up pre-operative physical/ exam for next Wednesday.

## 2024-04-05 NOTE — Progress Notes (Signed)
 Established Patient Office Visit  Subjective   Patient ID: [name] Sherman, female    DOB: 01-07-1961  Age: 63 y.o. MRN: [id number]  Chief Complaint  Patient presents with   Medication Refill    HPI   [name] Sherman is seen for medical follow-up.  She was supposed to be getting scheduled for preoperative clearance for shoulder surgery but they only put her in a 15-minute slot.  She is needing refills of several medications today including losartan , atorvastatin , levothyroxine , and citalopram .  She did recently run out of insurance for a while but is back under coverage now.  She has history of migraine headaches, obstructive sleep apnea, obesity, hypertension, GERD, fatty liver, type 2 diabetes, hypothyroidism, kidney stones, lumbar stenosis.  She had gastric bypass and has lost tremendous amount of weight since then.  A1c's have improved greatly though we have not checked any recently.  She is followed by chronic pain management and is on oxycodone .  Regarding chronic problems:  -Hypothyrodism- on Levothyroxine  and needs refills. -hyperlipidemia on Atorvastatin .   No myalgias -hypertension- treated with Losartan .   No dizziness.  Needs refills -Type 2 diabetes - controlled since gastric bypass and weight loss.   Fasting blood sugars usually low 100s.    Past Medical History:  Diagnosis Date   ADD (attention deficit disorder)    Anemia    Anxiety    Arthritis    shoulder, right (arthritis, tendonitis, bursitis)   Chronic low back pain    Complication of anesthesia    Dyspnea    Fatty liver    GERD (gastroesophageal reflux disease)    History of cervical cancer    s/p  laser ablation of cervix 1980's   History of COVID-19 07/2019   History of kidney stones    Hyperlipidemia    Hypertension    Hypothyroidism    IBS (irritable bowel syndrome)    MDD (major depressive disorder)    Migraine    OSA (obstructive sleep apnea)    pt used cpap up until 2013 states lost wt and did not  need anymore   PONV (postoperative nausea and vomiting)    Restless legs    Sleep apnea    wears CPAP   Type 2 diabetes mellitus (HCC)    Urgency of urination    Vertigo    Vitamin D  deficiency    Vulvar cyst    Past Surgical History:  Procedure Laterality Date   BUNIONECTOMY Right 2004   COLONOSCOPY     CYSTOSCOPY W/ RETROGRADES Bilateral 05/26/2015   Procedure: CYSTOSCOPY WITH RETROGRADE PYELOGRAM;  Surgeon: Donnice Brooks, MD;  Location: [facility];  Service: Urology;  Laterality: Bilateral;   CYSTOSCOPY W/ URETERAL STENT REMOVAL Left 05/26/2015   Procedure: CYSTOSCOPY WITH STENT REMOVAL;  Surgeon: Donnice Brooks, MD;  Location: [facility];  Service: Urology;  Laterality: Left;   CYSTOSCOPY WITH RETROGRADE PYELOGRAM, URETEROSCOPY AND STENT PLACEMENT Left 05/01/2015   Procedure: CYSTOSCOPY WITH RETROGRADE PYELOGRAM AND STENT PLACEMENT;  Surgeon: Donnice Brooks, MD;  Location: [facility];  Service: Urology;  Laterality: Left;   CYSTOSCOPY WITH STENT PLACEMENT Bilateral 05/26/2015   Procedure: CYSTOSCOPY WITH STENT PLACEMENT;  Surgeon: Donnice Brooks, MD;  Location: [facility];  Service: Urology;  Laterality: Bilateral;   CYSTOSCOPY WITH URETEROSCOPY Bilateral 05/26/2015   Procedure: CYSTOSCOPY WITH URETEROSCOPY;  Surgeon: Donnice Brooks, MD;  Location: [facility];  Service: Urology;  Laterality: Bilateral;   CYSTOSCOPY/URETEROSCOPY/HOLMIUM LASER/STENT PLACEMENT Right 06/09/2015  Procedure: CYSTOSCOPY/URETEROSCOPY/STENT PLACEMENT REMOVAL LEFT URETERAL STENT;  Surgeon: Donnice Brooks, MD;  Location: [facility];  Service: Urology;  Laterality: Right;   DIAGNOSTIC LAPAROSCOPY     HOLMIUM LASER APPLICATION Left 05/26/2015   Procedure: HOLMIUM LASER APPLICATION;  Surgeon: Donnice Brooks, MD;  Location: [facility];  Service: Urology;  Laterality: Left;   HOLMIUM LASER APPLICATION Right 06/09/2015    Procedure: HOLMIUM LASER APPLICATION;  Surgeon: Donnice Brooks, MD;  Location: [facility];  Service: Urology;  Laterality: Right;   INSERTION OF MESH N/A 09/05/2016   Procedure: INSERTION OF MESH;  Surgeon: Mitzie DELENA Freund, MD;  Location: [facility];  Service: General;  Laterality: N/A;   LAPAROSCOPIC ASSISTED VAGINAL HYSTERECTOMY  04/23/2002   @[facility]   by Dr Horacio;  With Left Salpingoophorectomy/  Anterior Repair/  Tension Free Tape Sling placement   LAPAROSCOPIC GASTRIC SLEEVE RESECTION N/A 09/07/2021   Procedure: LAPAROSCOPIC GASTRIC SLEEVE RESECTION;  Surgeon: Freund Mitzie DELENA, MD;  Location: [facility];  Service: General;  Laterality: N/A;   LASER ABLATION OF THE CERVIX  x2  1980's   POSTERIOR LAMINECTOMY / DECOMPRESSION LUMBAR SPINE  02/03/2020   @[facility]   Dr Colon;   L4--5 and fusion   POSTERIOR LUMBAR FUSION  01/21/2019   @[facility]  by Dr Colon;   L5--S1   TUBAL LIGATION  1980's   UPPER GI ENDOSCOPY N/A 09/07/2021   Procedure: UPPER GI ENDOSCOPY;  Surgeon: Freund Mitzie DELENA, MD;  Location: [facility];  Service: General;  Laterality: N/A;   VENTRAL HERNIA REPAIR N/A 09/05/2016   Procedure: LAPAROSCOPIC VENTRAL HERNIA;  Surgeon: Mitzie DELENA Freund, MD;  Location: [facility];  Service: General;  Laterality: N/A;    reports that she has never smoked. She has never used smokeless tobacco. She reports current alcohol use. She reports that she does not use drugs. family history includes Alcohol abuse in her mother; Arthritis in her father; COPD in her father; Cancer in her maternal grandmother and paternal grandmother; Cirrhosis in her sister; Colon cancer in her maternal grandmother and mother; Heart disease in her father; Hyperlipidemia in her father; Hypertension in her father; Kidney cancer in her sister; Kidney disease in her paternal uncle; Lung cancer in her mother; Rectal cancer in her maternal grandmother. Allergies  Allergen Reactions   Keflex  [Cephalexin ] Swelling and Other (See Comments)    Tongue swelling      Review of Systems  Constitutional:  Negative for malaise/fatigue.  Eyes:  Negative for blurred vision.  Respiratory:  Negative for shortness of breath.   Cardiovascular:  Negative for chest pain.  Neurological:  Negative for dizziness, weakness and headaches.      Objective:     BP (!) 142/80   Pulse 90   Temp 98.2 F (36.8 C) (Oral)   Wt 206 lb 3.2 oz (93.5 kg)   SpO2 95%   BMI 35.39 kg/m  BP Readings from Last 3 Encounters:  04/05/24 (!) 142/80  09/11/23 126/83  08/30/23 116/76   Wt Readings from Last 3 Encounters:  04/05/24 206 lb 3.2 oz (93.5 kg)  09/10/23 200 lb 0.2 oz (90.7 kg)  08/30/23 207 lb (93.9 kg)      Physical Exam Vitals reviewed.  Constitutional:      General: She is not in acute distress.    Appearance: She is well-developed. She is not ill-appearing.  Eyes:     Pupils: Pupils are equal, round, and reactive to light.  Neck:     Thyroid : No thyromegaly.  Vascular: No JVD.  Cardiovascular:     Rate and Rhythm: Normal rate and regular rhythm.     Heart sounds:     No gallop.  Pulmonary:     Effort: Pulmonary effort is normal. No respiratory distress.     Breath sounds: Normal breath sounds. No wheezing or rales.  Musculoskeletal:     Cervical back: Neck supple.  Neurological:     Mental Status: She is alert.      No results found for any visits on 04/05/24.  Last CBC Lab Results  Component Value Date   WBC 15.9 (H) 09/10/2023   HGB 14.3 09/10/2023   HCT 43.5 09/10/2023   MCV 87.0 09/10/2023   MCH 28.6 09/10/2023   RDW 14.6 09/10/2023   PLT 371 09/10/2023   Last metabolic panel Lab Results  Component Value Date   GLUCOSE 115 (H) 09/10/2023   NA 139 09/10/2023   K 3.7 09/10/2023   CL 102 09/10/2023   CO2 27 09/10/2023   BUN 13 09/10/2023   CREATININE 0.71 09/10/2023   GFRNONAA >60 09/10/2023   CALCIUM  9.4 09/10/2023   PROT 6.8 08/30/2023   ALBUMIN  3.6 08/30/2023   BILITOT 0.3 08/30/2023   ALKPHOS 105  08/30/2023   AST 20 08/30/2023   ALT 31 08/30/2023   ANIONGAP 10 09/10/2023   Last lipids Lab Results  Component Value Date   CHOL 297 (H) 08/30/2023   HDL 60.60 08/30/2023   LDLCALC 194 (H) 08/30/2023   TRIG 215.0 (H) 08/30/2023   CHOLHDL 5 08/30/2023   Last hemoglobin A1c Lab Results  Component Value Date   HGBA1C 6.5 08/30/2023   Last thyroid  functions Lab Results  Component Value Date   TSH 1.79 08/30/2023      The 10-year ASCVD risk score (Arnett DK, et al., 2019) is: 14.1%    Assessment & Plan:   #1 hypertension.  Slightly up today.  Refill losartan .  Follow low-sodium diet.  Set up preoperative clearance for next week and reassess at that point and adjust medications if still elevated.  She states she rushed to get here today and thinks that may have had something to do with elevation  #2 hyperlipidemia.  Patient on atorvastatin .  Just recently went back on medication.  Refill atorvastatin  for 1 year and check fasting lipids and CMP at follow-up  #3 hypothyroidism on levothyroxine .  Refilled medication for 1 year.  Check TSH and follow-up  #4 history of recurrent depression.  She is on citalopram .  Refill medication.  #5 Type 2 diabetes controlled.  Will check A1c at follow up preoperative visit next week.    Wolm Scarlet, MD

## 2024-04-10 ENCOUNTER — Other Ambulatory Visit (HOSPITAL_BASED_OUTPATIENT_CLINIC_OR_DEPARTMENT_OTHER): Payer: Self-pay

## 2024-04-10 ENCOUNTER — Other Ambulatory Visit: Payer: Self-pay | Admitting: Family Medicine

## 2024-04-10 ENCOUNTER — Encounter: Payer: Self-pay | Admitting: Family Medicine

## 2024-04-10 ENCOUNTER — Ambulatory Visit: Admitting: Family Medicine

## 2024-04-10 VITALS — BP 150/86 | HR 111 | Temp 98.2°F | Wt 206.6 lb

## 2024-04-10 DIAGNOSIS — E039 Hypothyroidism, unspecified: Secondary | ICD-10-CM

## 2024-04-10 DIAGNOSIS — E1165 Type 2 diabetes mellitus with hyperglycemia: Secondary | ICD-10-CM | POA: Diagnosis not present

## 2024-04-10 DIAGNOSIS — E785 Hyperlipidemia, unspecified: Secondary | ICD-10-CM | POA: Diagnosis not present

## 2024-04-10 DIAGNOSIS — Z01818 Encounter for other preprocedural examination: Secondary | ICD-10-CM

## 2024-04-10 LAB — COMPREHENSIVE METABOLIC PANEL WITH GFR
ALT: 11 U/L (ref 0–35)
AST: 16 U/L (ref 0–37)
Albumin: 3.8 g/dL (ref 3.5–5.2)
Alkaline Phosphatase: 113 U/L (ref 39–117)
BUN: 17 mg/dL (ref 6–23)
CO2: 28 meq/L (ref 19–32)
Calcium: 9.3 mg/dL (ref 8.4–10.5)
Chloride: 104 meq/L (ref 96–112)
Creatinine, Ser: 0.78 mg/dL (ref 0.40–1.20)
GFR: 81.93 mL/min (ref >60.00)
Glucose, Bld: 102 mg/dL — ABNORMAL HIGH (ref 70–99)
Potassium: 3.7 meq/L (ref 3.5–5.1)
Sodium: 140 meq/L (ref 135–145)
Total Bilirubin: 0.4 mg/dL (ref 0.2–1.2)
Total Protein: 7.1 g/dL (ref 6.0–8.3)

## 2024-04-10 LAB — CBC WITH DIFFERENTIAL/PLATELET
Basophils Absolute: 0.1 K/uL (ref 0.0–0.1)
Basophils Relative: 0.7 % (ref 0.0–3.0)
Eosinophils Absolute: 0.1 K/uL (ref 0.0–0.7)
Eosinophils Relative: 0.6 % (ref 0.0–5.0)
HCT: 39.1 % (ref 36.0–46.0)
Hemoglobin: 12.8 g/dL (ref 12.0–15.0)
Lymphocytes Relative: 26.1 % (ref 12.0–46.0)
Lymphs Abs: 3.4 K/uL (ref 0.7–4.0)
MCHC: 32.6 g/dL (ref 30.0–36.0)
MCV: 83 fl (ref 78.0–100.0)
Monocytes Absolute: 0.8 K/uL (ref 0.1–1.0)
Monocytes Relative: 6.5 % (ref 3.0–12.0)
Neutro Abs: 8.5 K/uL — ABNORMAL HIGH (ref 1.4–7.7)
Neutrophils Relative %: 66.1 % (ref 43.0–77.0)
Platelets: 352 K/uL (ref 150.0–400.0)
RBC: 4.71 Mil/uL (ref 3.87–5.11)
RDW: 14.4 % (ref 11.5–15.5)
WBC: 12.9 K/uL — ABNORMAL HIGH (ref 4.0–10.5)

## 2024-04-10 LAB — HEMOGLOBIN A1C: Hgb A1c MFr Bld: 6.3 % (ref 4.6–6.5)

## 2024-04-10 LAB — LIPID PANEL
Cholesterol: 223 mg/dL — ABNORMAL HIGH (ref 0–200)
HDL: 55 mg/dL (ref >39.00)
LDL Cholesterol: 150 mg/dL — ABNORMAL HIGH (ref 0–99)
NonHDL: 168.28
Total CHOL/HDL Ratio: 4
Triglycerides: 89 mg/dL (ref 0.0–149.0)
VLDL: 17.8 mg/dL (ref 0.0–40.0)

## 2024-04-10 LAB — TSH: TSH: 2.22 u[IU]/mL (ref 0.35–5.50)

## 2024-04-10 MED ORDER — AMPHETAMINE-DEXTROAMPHETAMINE 30 MG PO TABS
30.0000 mg | ORAL_TABLET | Freq: Two times a day (BID) | ORAL | 0 refills | Status: DC
  Filled 2024-04-10: qty 60, 30d supply, fill #0

## 2024-04-10 MED ORDER — AMPHETAMINE-DEXTROAMPHETAMINE 30 MG PO TABS
30.0000 mg | ORAL_TABLET | Freq: Two times a day (BID) | ORAL | 0 refills | Status: DC
  Filled 2024-04-10 – 2024-05-13 (×2): qty 60, 30d supply, fill #0

## 2024-04-10 MED ORDER — AMPHETAMINE-DEXTROAMPHETAMINE 30 MG PO TABS
30.0000 mg | ORAL_TABLET | Freq: Two times a day (BID) | ORAL | 0 refills | Status: DC
  Filled 2024-04-10 – 2024-06-11 (×2): qty 60, 30d supply, fill #0

## 2024-04-10 NOTE — Progress Notes (Unsigned)
 Established Patient Office Visit  Subjective   Patient ID: [name] Sherman, female    DOB: Jan 11, 1961  Age: 63 y.o. MRN: [id number]  Chief Complaint  Patient presents with   Pre-op Exam    HPI  {History (Optional):23778} [name] Sherman is seen for preoperative clearance for shoulder surgery.  She has history of migraine headaches, hypertension, obstructive sleep apnea, GERD, type 2 diabetes, hypothyroidism, lumbar stenosis, hyperlipidemia, ADD.  She had gastric bypass over a year ago and lost about 100 pounds but has gained back about 15 pounds of that.  She had CT morphology study back couple years ago and coronary calcium  score of 7.8 at that time.  Denies any recent chest pains.  No chronic lung disease.  Does have history of obstructive sleep apnea but was able to come off CPAP after her gastric bypass and weight loss.  She does have hyperlipidemia with extremely high cholesterol and is on atorvastatin  10 mg daily.  She has had some recent concerns because of things she has read about potential link with dementia in patients on statins.  Past Medical History:  Diagnosis Date   ADD (attention deficit disorder)    Anemia    Anxiety    Arthritis    shoulder, right (arthritis, tendonitis, bursitis)   Chronic low back pain    Complication of anesthesia    Dyspnea    Fatty liver    GERD (gastroesophageal reflux disease)    History of cervical cancer    s/p  laser ablation of cervix 1980's   History of COVID-19 07/2019   History of kidney stones    Hyperlipidemia    Hypertension    Hypothyroidism    IBS (irritable bowel syndrome)    MDD (major depressive disorder)    Migraine    OSA (obstructive sleep apnea)    pt used cpap up until 2013 states lost wt and did not need anymore   PONV (postoperative nausea and vomiting)    Restless legs    Sleep apnea    wears CPAP   Type 2 diabetes mellitus (HCC)    Urgency of urination    Vertigo    Vitamin D  deficiency    Vulvar cyst     Past Surgical History:  Procedure Laterality Date   BUNIONECTOMY Right 2004   COLONOSCOPY     CYSTOSCOPY W/ RETROGRADES Bilateral 05/26/2015   Procedure: CYSTOSCOPY WITH RETROGRADE PYELOGRAM;  Surgeon: Donnice Brooks, MD;  Location: [facility];  Service: Urology;  Laterality: Bilateral;   CYSTOSCOPY W/ URETERAL STENT REMOVAL Left 05/26/2015   Procedure: CYSTOSCOPY WITH STENT REMOVAL;  Surgeon: Donnice Brooks, MD;  Location: [facility];  Service: Urology;  Laterality: Left;   CYSTOSCOPY WITH RETROGRADE PYELOGRAM, URETEROSCOPY AND STENT PLACEMENT Left 05/01/2015   Procedure: CYSTOSCOPY WITH RETROGRADE PYELOGRAM AND STENT PLACEMENT;  Surgeon: Donnice Brooks, MD;  Location: [facility];  Service: Urology;  Laterality: Left;   CYSTOSCOPY WITH STENT PLACEMENT Bilateral 05/26/2015   Procedure: CYSTOSCOPY WITH STENT PLACEMENT;  Surgeon: Donnice Brooks, MD;  Location: [facility];  Service: Urology;  Laterality: Bilateral;   CYSTOSCOPY WITH URETEROSCOPY Bilateral 05/26/2015   Procedure: CYSTOSCOPY WITH URETEROSCOPY;  Surgeon: Donnice Brooks, MD;  Location[facility];  Service: Urology;  Laterality: Bilateral;   CYSTOSCOPY/URETEROSCOPY/HOLMIUM LASER/STENT PLACEMENT Right 06/09/2015   Procedure: CYSTOSCOPY/URETEROSCOPY/STENT PLACEMENT REMOVAL LEFT URETERAL STENT;  Surgeon: Donnice Brooks, MD;  Location: [facility];  Service: Urology;  Laterality: Right;   DIAGNOSTIC LAPAROSCOPY  HOLMIUM LASER APPLICATION Left 05/26/2015   Procedure: HOLMIUM LASER APPLICATION;  Surgeon: Donnice Brooks, MD;  Location: [facility];  Service: Urology;  Laterality: Left;   HOLMIUM LASER APPLICATION Right 06/09/2015   Procedure: HOLMIUM LASER APPLICATION;  Surgeon: Donnice Brooks, MD;  Location: [facility];  Service: Urology;  Laterality: Right;   INSERTION OF MESH N/A 09/05/2016   Procedure: INSERTION OF MESH;  Surgeon: Mitzie DELENA Freund,  MD;  Location: [facility];  Service: General;  Laterality: N/A;   LAPAROSCOPIC ASSISTED VAGINAL HYSTERECTOMY  04/23/2002   @[facility]   by Dr Horacio;  With Left Salpingoophorectomy/  Anterior Repair/  Tension Free Tape Sling placement   LAPAROSCOPIC GASTRIC SLEEVE RESECTION N/A 09/07/2021   Procedure: LAPAROSCOPIC GASTRIC SLEEVE RESECTION;  Surgeon: Freund Mitzie DELENA, MD;  Location: [facility];  Service: General;  Laterality: N/A;   LASER ABLATION OF THE CERVIX  x2  1980's   POSTERIOR LAMINECTOMY / DECOMPRESSION LUMBAR SPINE  02/03/2020   @[facility]   Dr Colon;   L4--5 and fusion   POSTERIOR LUMBAR FUSION  01/21/2019   @[facility]  by Dr Colon;   L5--S1   TUBAL LIGATION  1980's   UPPER GI ENDOSCOPY N/A 09/07/2021   Procedure: UPPER GI ENDOSCOPY;  Surgeon: Freund Mitzie DELENA, MD;  Location: [facility];  Service: General;  Laterality: N/A;   VENTRAL HERNIA REPAIR N/A 09/05/2016   Procedure: LAPAROSCOPIC VENTRAL HERNIA;  Surgeon: Mitzie DELENA Freund, MD;  Location: [facility];  Service: General;  Laterality: N/A;    reports that she has never smoked. She has never used smokeless tobacco. She reports current alcohol use. She reports that she does not use drugs. family history includes Alcohol abuse in her mother; Arthritis in her father; COPD in her father; Cancer in her maternal grandmother and paternal grandmother; Cirrhosis in her sister; Colon cancer in her maternal grandmother and mother; Heart disease in her father; Hyperlipidemia in her father; Hypertension in her father; Kidney cancer in her sister; Kidney disease in her paternal uncle; Lung cancer in her mother; Rectal cancer in her maternal grandmother. Allergies  Allergen Reactions   Keflex  [Cephalexin ] Swelling and Other (See Comments)    Tongue swelling    Review of Systems  Constitutional:  Negative for chills, fever and malaise/fatigue.  Eyes:  Negative for blurred vision.  Respiratory:  Negative for shortness of breath.   Cardiovascular:  Negative for chest pain.   Gastrointestinal:  Negative for abdominal pain.  Genitourinary:  Negative for dysuria.  Neurological:  Negative for dizziness, weakness and headaches.      Objective:     BP (!) 150/86   Pulse (!) 111   Temp 98.2 F (36.8 C) (Oral)   Wt 206 lb 9.6 oz (93.7 kg)   SpO2 95%   BMI 35.46 kg/m  BP Readings from Last 3 Encounters:  04/10/24 (!) 150/86  04/05/24 (!) 142/80  09/11/23 126/83   Wt Readings from Last 3 Encounters:  04/10/24 206 lb 9.6 oz (93.7 kg)  04/05/24 206 lb 3.2 oz (93.5 kg)  09/10/23 200 lb 0.2 oz (90.7 kg)      Physical Exam Vitals reviewed.  Constitutional:      General: She is not in acute distress.    Appearance: She is well-developed. She is not ill-appearing.  Eyes:     Pupils: Pupils are equal, round, and reactive to light.  Neck:     Thyroid : No thyromegaly.     Vascular: No JVD.  Cardiovascular:  Rate and Rhythm: Normal rate and regular rhythm.     Heart sounds:     No gallop.  Pulmonary:     Effort: Pulmonary effort is normal. No respiratory distress.     Breath sounds: Normal breath sounds. No wheezing or rales.  Musculoskeletal:     Cervical back: Neck supple.     Right lower leg: No edema.     Left lower leg: No edema.  Neurological:     Mental Status: She is alert.      No results found for any visits on 04/10/24.  Last CBC Lab Results  Component Value Date   WBC 15.9 (H) 09/10/2023   HGB 14.3 09/10/2023   HCT 43.5 09/10/2023   MCV 87.0 09/10/2023   MCH 28.6 09/10/2023   RDW 14.6 09/10/2023   PLT 371 09/10/2023   Last metabolic panel Lab Results  Component Value Date   GLUCOSE 115 (H) 09/10/2023   NA 139 09/10/2023   K 3.7 09/10/2023   CL 102 09/10/2023   CO2 27 09/10/2023   BUN 13 09/10/2023   CREATININE 0.71 09/10/2023   GFRNONAA >60 09/10/2023   CALCIUM  9.4 09/10/2023   PROT 6.8 08/30/2023   ALBUMIN  3.6 08/30/2023   BILITOT 0.3 08/30/2023   ALKPHOS 105 08/30/2023   AST 20 08/30/2023   ALT 31  08/30/2023   ANIONGAP 10 09/10/2023   Last lipids Lab Results  Component Value Date   CHOL 297 (H) 08/30/2023   HDL 60.60 08/30/2023   LDLCALC 194 (H) 08/30/2023   TRIG 215.0 (H) 08/30/2023   CHOLHDL 5 08/30/2023   Last hemoglobin A1c Lab Results  Component Value Date   HGBA1C 6.5 08/30/2023   Last thyroid  functions Lab Results  Component Value Date   TSH 1.79 08/30/2023      The 10-year ASCVD risk score (Arnett DK, et al., 2019) is: 15.6%    Assessment & Plan:   Problem List Items Addressed This Visit       Unprioritized   Type 2 diabetes mellitus (HCC)   Relevant Orders   Hemoglobin A1c   Hypothyroid   Relevant Orders   TSH   Hyperlipidemia   Relevant Orders   CMP   Lipid panel   Other Visit Diagnoses       Preoperative evaluation to rule out surgical contraindication    -  Primary   Relevant Orders   CBC with Differential/Platelet     Lesieli has chronic problems as above.  Previous CT morphology study back in 2023 showed favorable results with coronary calcium  score of 7.8 which was all right coronary.  She denies recent chest pain.  She has type 2 diabetes which improved following her gastric bypass surgery.  Needs follow-up labs today as above.  Check EKG preoperatively  No follow-ups on file.    Wolm Scarlet, MD

## 2024-04-11 ENCOUNTER — Ambulatory Visit: Payer: Self-pay | Admitting: Family Medicine

## 2024-04-12 ENCOUNTER — Telehealth: Payer: Self-pay | Admitting: *Deleted

## 2024-04-12 NOTE — Telephone Encounter (Signed)
 Patient informed of the message below and voiced understanding

## 2024-04-12 NOTE — Telephone Encounter (Signed)
 Copied from CRM [id number]. Topic: Clinical - Lab/Test Results >> Apr 12, 2024 10:11 AM [name] Sherman wrote: Reason for CRM: Patient is returning a call she received from [name] recently, I advised of the message from [name]. Patient started taking Atorvastatin  after her last appointment with [name]; however, prior to that she was off the medication for 3 months due to insurance issues.

## 2024-04-15 ENCOUNTER — Ambulatory Visit (INDEPENDENT_AMBULATORY_CARE_PROVIDER_SITE_OTHER)

## 2024-04-15 ENCOUNTER — Ambulatory Visit: Admitting: Podiatry

## 2024-04-15 ENCOUNTER — Encounter: Payer: Self-pay | Admitting: Podiatry

## 2024-04-15 VITALS — Ht 64.0 in | Wt 206.6 lb

## 2024-04-15 DIAGNOSIS — M2012 Hallux valgus (acquired), left foot: Secondary | ICD-10-CM

## 2024-04-15 NOTE — Progress Notes (Signed)
 Chief Complaint  Patient presents with   Bunions    Pt is here due to bunion on the left foot, causing pain, states growing bigger.    Subjective: Patient presents today for follow-up evaluation of bunion deformity to the left foot.  She says that it continues to progress and become more symptomatic every day.  She has failed multiple conservative managements including wide shoes and anti-inflammatory but she continues to have pain.  She would like to discuss pending surgery today  Past Medical History:  Diagnosis Date   ADD (attention deficit disorder)    Anemia    Anxiety    Arthritis    shoulder, right (arthritis, tendonitis, bursitis)   Chronic low back pain    Complication of anesthesia    Dyspnea    Fatty liver    GERD (gastroesophageal reflux disease)    History of cervical cancer    s/p  laser ablation of cervix 1980's   History of COVID-19 07/2019   History of kidney stones    Hyperlipidemia    Hypertension    Hypothyroidism    IBS (irritable bowel syndrome)    MDD (major depressive disorder)    Migraine    OSA (obstructive sleep apnea)    pt used cpap up until 2013 states lost wt and did not need anymore   PONV (postoperative nausea and vomiting)    Restless legs    Sleep apnea    wears CPAP   Type 2 diabetes mellitus (HCC)    Urgency of urination    Vertigo    Vitamin D  deficiency    Vulvar cyst     Objective:  General: Well developed, nourished, in no acute distress, alert and oriented x3   Dermatology: Skin is warm, dry and supple bilateral.  Medial lateral border bilateral great toes is tender with evidence of an ingrowing nail. Pain on palpation noted to the border of the nail fold. The remaining nails appear unremarkable at this time. There are no open sores, lesions.  Vascular: DP and PT pulses palpable.  No clinical evidence of vascular compromise  Neruologic: Grossly intact via light touch bilateral.  Musculoskeletal: Clinical evidence of  hallux valgus bunion deformity noted with a prominent medial eminence of the first metatarsal head left foot.  Associated tenderness to palpation  Radiographic exam LT foot 04/15/2024: Increased intermetatarsal angle with medial deviation of the first metatarsal left foot and a prominent medial eminence of the first metatarsal head.  Moderate erosive changes noted throughout the joint.  Assesement: #1 hallux valgus left  Plan of Care:  -Patient evaluated.  -Again today we discussed different treatment options both conservative and surgical for the chronic pain to the left great toe joint bunion deformity.   -In regards to surgery I do believe the patient would benefit from corrective bunion surgery to alleviate pressure from the great toe joint.  She has failed conservative treatment and continues to have pain on a daily basis.  We discussed surgery in detail including the risk benefits advantages disadvantages of the procedure.  No guarantees were expressed or implied.  All patient questions answered.  After discussion today she consents would like to proceed with surgery -Authorization for surgery was initiated today.  Surgery will consist of bunionectomy with osteotomy left -Return to clinic 1 week postop  *best friends with Marykay Louder, patient  Thresa EMERSON Sar, DPM [facility]  Dr. Thresa EMERSON Sar, DPM    [address]  [address] [place] [address]                Office [phone]  Fax [phone]

## 2024-04-16 ENCOUNTER — Telehealth: Payer: Self-pay

## 2024-04-16 NOTE — Telephone Encounter (Signed)
 Faxed

## 2024-04-16 NOTE — Telephone Encounter (Signed)
 Copied from CRM #[id number]. Topic: General - Other >> Apr 16, 2024 10:30 AM Burnard DEL wrote: Reason for CRM: Katheryn from [facility] called stating that they have not received surgical clearance back on patient.They received her last OV notes ,but not the  clearance that should be marked clear.

## 2024-04-19 ENCOUNTER — Other Ambulatory Visit (HOSPITAL_BASED_OUTPATIENT_CLINIC_OR_DEPARTMENT_OTHER): Payer: Self-pay

## 2024-04-21 ENCOUNTER — Other Ambulatory Visit (HOSPITAL_BASED_OUTPATIENT_CLINIC_OR_DEPARTMENT_OTHER): Payer: Self-pay

## 2024-04-22 ENCOUNTER — Other Ambulatory Visit (HOSPITAL_BASED_OUTPATIENT_CLINIC_OR_DEPARTMENT_OTHER): Payer: Self-pay

## 2024-04-23 ENCOUNTER — Telehealth: Payer: Self-pay | Admitting: Podiatry

## 2024-04-23 ENCOUNTER — Other Ambulatory Visit (HOSPITAL_BASED_OUTPATIENT_CLINIC_OR_DEPARTMENT_OTHER): Payer: Self-pay

## 2024-04-23 NOTE — Telephone Encounter (Signed)
 Called and left message for patient to contact office to schedule surgery.

## 2024-04-30 LAB — HM MAMMOGRAPHY

## 2024-05-02 NOTE — Patient Instructions (Signed)
 SURGICAL WAITING ROOM VISITATION  Patients having surgery or a procedure may have no more than 2 support people in the waiting area - these visitors may rotate.    Children under the age of 24 must have an adult with them who is not the patient.  Visitors with respiratory illnesses are discouraged from visiting and should remain at home.  If the patient needs to stay at the hospital during part of their recovery, the visitor guidelines for inpatient rooms apply. Pre-op nurse will coordinate an appropriate time for 1 support person to accompany patient in pre-op.  This support person may not rotate.    Please refer to the [facility] website for the visitor guidelines for Inpatients (after your surgery is over and you are in a regular room).       Your procedure is scheduled on: 05/09/24   Report to [facility] Main Entrance    Report to admitting at 5:15 AM   Call this number if you have problems the morning of surgery [phone]   Do not eat food :After Midnight.   After Midnight you may have the following liquids until 4:15 AM DAY OF SURGERY  Water  Non-Citrus Juices (without pulp, NO RED-Apple, White grape, White cranberry) Black Coffee (NO MILK/CREAM OR CREAMERS, sugar ok)  Clear Tea (NO MILK/CREAM OR CREAMERS, sugar ok) regular and decaf                             Plain Jell-O (NO RED)                                           Fruit ices (not with fruit pulp, NO RED)                                     Popsicles (NO RED)                                                               Sports drinks like Gatorade (NO RED)                   The day of surgery:  Drink ONE (1) Pre-Surgery  G2 at 4:15 AM the morning of surgery. Drink in one sitting. Do not sip.  This drink was given to you during your hospital  pre-op appointment visit. Nothing else to drink after completing the  Pre-Surgery G2.            Oral Hygiene is also important to reduce your risk of  infection.                                    Remember - BRUSH YOUR TEETH THE MORNING OF SURGERY WITH YOUR REGULAR TOOTHPASTE  DENTURES WILL BE REMOVED PRIOR TO SURGERY PLEASE DO NOT APPLY Poly grip OR ADHESIVES!!!     Stop all vitamins and herbal supplements 7 days before surgery.   Take these medicines the morning of surgery  with A SIP OF WATER : Atorvastatin , cetirizine, citalopram , levothyroxine , nortrptyline, tamsulosin                Do not take adderall, lasix  or losartan  the morning of surgery.  DO NOT TAKE ANY ORAL DIABETIC MEDICATIONS DAY OF YOUR SURGERY  Bring CPAP mask and tubing day of surgery.                              You may not have any metal on your body including hair pins, jewelry, and body piercing             Do not wear make-up, lotions, powders, perfumes/cologne, or deodorant  Do not wear nail polish including gel and S&S, artificial/acrylic nails, or any other type of covering on natural nails including finger and toenails. If you have artificial nails, gel coating, etc. that needs to be removed by a nail salon please have this removed prior to surgery or surgery may need to be canceled/ delayed if the surgeon/ anesthesia feels like they are unable to be safely monitored.   Do not shave  48 hours prior to surgery.           .   Do not bring valuables to the hospital. [facility] IS NOT             RESPONSIBLE   FOR VALUABLES.   Contacts, glasses, dentures or bridgework may not be worn into surgery.    DO NOT BRING YOUR HOME MEDICATIONS TO THE HOSPITAL. PHARMACY WILL DISPENSE MEDICATIONS LISTED ON YOUR MEDICATION LIST TO YOU DURING YOUR ADMISSION IN THE HOSPITAL!    Patients discharged on the day of surgery will not be allowed to drive home.  Someone NEEDS to stay with you for the first 24 hours after anesthesia.   Special Instructions: Bring a copy of your healthcare power of attorney and living will documents the day of surgery if you haven't scanned  them before.              Please read over the following fact sheets you were given: IF YOU HAVE QUESTIONS ABOUT YOUR PRE-OP INSTRUCTIONS PLEASE [phone] [place]   If you received a COVID test during your pre-op visit  it is requested that you wear a mask when out in public, stay away from anyone that may not be feeling well and notify your surgeon if you develop symptoms. If you test positive for Covid or have been in contact with anyone that has tested positive in the last 10 days please notify you surgeon.    [facility] - Preparing for Surgery Before surgery, you can play an important role.  Because skin is not sterile, your skin needs to be as free of germs as possible.  You can reduce the number of germs on your skin by washing with CHG (chlorahexidine gluconate) soap before surgery.  CHG is an antiseptic cleaner which kills germs and bonds with the skin to continue killing germs even after washing. Please DO NOT use if you have an allergy to CHG or antibacterial soaps.  If your skin becomes reddened/irritated stop using the CHG and inform your nurse when you arrive at Short Stay. Do not shave (including legs and underarms) for at least 48 hours prior to the first CHG shower.  You may shave your face/neck.  Please follow these instructions carefully:  1.  Shower with CHG Soap the night before surgery ONLY (  DO NOT USE THE SOAP THE MORNING OF SURGERY).  2.  If you choose to wash your hair, wash your hair first as usual with your normal  shampoo.  3.  After you shampoo, rinse your hair and body thoroughly to remove the shampoo.                             4.  Use CHG as you would any other liquid soap.  You can apply chg directly to the skin and wash.  Gently with a scrungie or clean washcloth.  5.  Apply the CHG Soap to your body ONLY FROM THE NECK DOWN.   Do   not use on face/ open                           Wound or open sores. Avoid contact with eyes, ears mouth and   genitals (private  parts).                       Wash face,  Genitals (private parts) with your normal soap.             6.  Wash thoroughly, paying special attention to the area where your    surgery  will be performed.  7.  Thoroughly rinse your body with warm water  from the neck down.  8.  DO NOT shower/wash with your normal soap after using and rinsing off the CHG Soap.                9.  Pat yourself dry with a clean towel.            10.  Wear clean pajamas.            11.  Place clean sheets on your bed the night of your first shower and do not  sleep with pets. Day of Surgery : Do not apply any CHG, lotions/deodorants the morning of surgery.  Please wear clean clothes to the hospital/surgery center.  FAILURE TO FOLLOW THESE INSTRUCTIONS MAY RESULT IN THE CANCELLATION OF YOUR SURGERY  PATIENT SIGNATURE_________________________________  NURSE SIGNATURE__________________________________  ________________________________________________________________________Incentive [place] (Watch this video at home: [email or web address])  An incentive spirometer is a tool that can help keep your lungs clear and active. This tool measures how well you are filling your lungs with each breath. Taking long deep breaths may help reverse or decrease the chance of developing breathing (pulmonary) problems (especially infection) following: A long period of time when you are unable to move or be active. BEFORE THE PROCEDURE  If the spirometer includes an indicator to show your best effort, your nurse or respiratory therapist will set it to a desired goal. If possible, sit up straight or lean slightly forward. Try not to slouch. Hold the incentive spirometer in an upright position. INSTRUCTIONS FOR USE  Sit on the edge of your bed if possible, or sit up as far as you can in bed or on a chair. Hold the incentive spirometer in an upright position. Breathe out normally. Place the mouthpiece in your  mouth and seal your lips tightly around it. Breathe in slowly and as deeply as possible, raising the piston or the ball toward the top of the column. Hold your breath for 3-5 seconds or for as long as possible. Allow the piston or ball to fall to the bottom of the column. Remove  the mouthpiece from your mouth and breathe out normally. Rest for a few seconds and repeat Steps 1 through 7 at least 10 times every 1-2 hours when you are awake. Take your time and take a few normal breaths between deep breaths. The spirometer may include an indicator to show your best effort. Use the indicator as a goal to work toward during each repetition. After each set of 10 deep breaths, practice coughing to be sure your lungs are clear. If you have an incision (the cut made at the time of surgery), support your incision when coughing by placing a pillow or rolled up towels firmly against it. Once you are able to get out of bed, walk around indoors and cough well. You may stop using the incentive spirometer when instructed by your caregiver.  RISKS AND COMPLICATIONS Take your time so you do not get dizzy or light-headed. If you are in pain, you may need to take or ask for pain medication before doing incentive spirometry. It is harder to take a deep breath if you are having pain. AFTER USE Rest and breathe slowly and easily. It can be helpful to keep track of a log of your progress. Your caregiver can provide you with a simple table to help with this. If you are using the spirometer at home, follow these instructions: SEEK MEDICAL CARE IF:  You are having difficultly using the spirometer. You have trouble using the spirometer as often as instructed. Your pain medication is not giving enough relief while using the spirometer. You develop fever of 100.5 F (38.1 C) or higher. SEEK IMMEDIATE MEDICAL CARE IF:  You cough up bloody sputum that had not been present before. You develop fever of 102 F (38.9 C) or  greater. You develop worsening pain at or near the incision site. MAKE SURE YOU:  Understand these instructions. Will watch your condition. Will get help right away if you are not doing well or get worse.  SABRAHow to Manage Your Diabetes Before and After Surgery  Why is it important to control my blood sugar before and after surgery? Improving blood sugar levels before and after surgery helps healing and can limit problems. A way of improving blood sugar control is eating a healthy diet by:  Eating less sugar and carbohydrates  Increasing activity/exercise  Talking with your doctor about reaching your blood sugar goals High blood sugars (greater than 180 mg/dL) can raise your risk of infections and slow your recovery, so you will need to focus on controlling your diabetes during the weeks before surgery. Make sure that the doctor who takes care of your diabetes knows about your planned surgery including the date and location.  How do I manage my blood sugar before surgery? Check your blood sugar at least 4 times a day, starting 2 days before surgery, to make sure that the level is not too high or low. Check your blood sugar the morning of your surgery when you wake up and every 2 hours until you get to the Short Stay unit. If your blood sugar is less than 70 mg/dL, you will need to treat for low blood sugar: Do not take insulin . Treat a low blood sugar (less than 70 mg/dL) with  cup of clear juice (cranberry or apple), 4 glucose tablets, OR glucose gel. Recheck blood sugar in 15 minutes after treatment (to make sure it is greater than 70 mg/dL). If your blood sugar is not greater than 70 mg/dL on recheck, call [phone]  for further instructions. Report your blood sugar to the short stay nurse when you get to Short Stay.  If you are admitted to the hospital after surgery: Your blood sugar will be checked by the staff and you will probably be given insulin  after surgery (instead of oral  diabetes medicines) to make sure you have good blood sugar levels. The goal for blood sugar control after surgery is 80-180 mg/dL.   WHAT DO I DO ABOUT MY DIABETES MEDICATION?  Do not take oral diabetes medicines (pills) the morning of surgery.  THE NIGHT BEFORE SURGERY,  no bedtime dose of insulin       THE MORNING OF SURGERY, Check blood sugar. If greater than 220 only take half of ususal dose of insulin . If not greater than 220 don't take any insulin   DO NOT TAKE THE FOLLOWING 7 DAYS PRIOR TO SURGERY: Ozempic , Wegovy , Rybelsus  (Semaglutide ), Byetta (exenatide), Bydureon (exenatide ER), Victoza, Saxenda  (liraglutide ), or Trulicity (dulaglutide) Mounjaro (Tirzepatide) Adlyxin (Lixisenatide), Polyethylene Glycol Loxenatide.  Patient Signature:  Date:   Nurse Signature:  Date:   Reviewed and Endorsed by [facility] Patient Education Committee, August 2015

## 2024-05-02 NOTE — Progress Notes (Signed)
 COVID Vaccine received:  []  No [x]  Yes Date of any COVID positive Test in last 90 days: no PCP - Dr. Wolm Scarlet Cardiologist - n/a  Chest x-ray -  EKG -  04/10/24 Epic Stress Test -  ECHO - 10/26/21 Epic Cardiac Cath -   Bowel Prep - [x]  No  []   Yes ______  Pacemaker / ICD device [x]  No []  Yes   Spinal Cord Stimulator:[x]  No []  Yes       History of Sleep Apnea? []  No [x]  Yes   CPAP used?- [x]  No []  Yes    Does the patient monitor blood sugar?          []  No [x]  Yes  []  N/A  Patient has: []  NO Hx DM   []  Pre-DM                 []  DM1  [x]   DM2 Does patient have a [employer] or Dexacom? [x]  No []  Yes   Fasting Blood Sugar Ranges- 100 Checks Blood Sugar ___3-4__ times a day  GLP1 agonist / usual dose - No GLP1 instructions:  SGLT-2 inhibitors / usual dose - no SGLT-2 instructions:   Blood Thinner / Instructions:no Aspirin  Instructions:no  Comments:   Activity level: Patient is  unable to climb a flight of stairs without difficulty; [x]  No CP  [x]  No SOB, but would have _back pain__   Patient can perform ADLs without assistance.   Anesthesia review:   Patient denies shortness of breath, fever, cough and chest pain at PAT appointment.  Patient verbalized understanding and agreement to the Pre-Surgical Instructions that were given to them at this PAT appointment. Patient was also educated of the need to review these PAT instructions again prior to his/her surgery.I reviewed the appropriate phone numbers to call if they have any and questions or concerns.

## 2024-05-03 ENCOUNTER — Other Ambulatory Visit: Payer: Self-pay

## 2024-05-03 ENCOUNTER — Encounter (HOSPITAL_COMMUNITY)
Admission: RE | Admit: 2024-05-03 | Discharge: 2024-05-03 | Disposition: A | Discharge status: Home / Self Care | Source: Ambulatory Visit

## 2024-05-03 ENCOUNTER — Encounter (HOSPITAL_COMMUNITY): Payer: Self-pay

## 2024-05-03 VITALS — BP 146/79 | HR 86 | Temp 98.2°F | Resp 20 | Ht 64.0 in | Wt 203.0 lb

## 2024-05-03 DIAGNOSIS — Z01818 Encounter for other preprocedural examination: Secondary | ICD-10-CM | POA: Insufficient documentation

## 2024-05-03 DIAGNOSIS — E1165 Type 2 diabetes mellitus with hyperglycemia: Secondary | ICD-10-CM

## 2024-05-03 LAB — GLUCOSE, CAPILLARY: Glucose-Capillary: 164 mg/dL — ABNORMAL HIGH (ref 70–99)

## 2024-05-06 ENCOUNTER — Other Ambulatory Visit (HOSPITAL_BASED_OUTPATIENT_CLINIC_OR_DEPARTMENT_OTHER): Payer: Self-pay

## 2024-05-06 ENCOUNTER — Other Ambulatory Visit: Payer: Self-pay

## 2024-05-08 NOTE — H&P (Signed)
 ORTHOPAEDIC H&P  REQUESTING PHYSICIAN: Teresa Rankin ORN, MD  PCP:  Micheal Wolm ORN, MD  Chief Complaint: right shoulder weakness and pain  HPI: [name] Sherman is a 63 y.o. female who presents for right massive rotator cuff tear involving the supraspinatus, infraspinatus and subscapularis as well as partial tearing of the long head of the biceps tendon.  The patient presents for arthroscopic repair with dermal allograft augmentation, biceps tenodesis, sub acromial decompression and humeral head microfracture. She has had no interval changes in her medical history.  Past Medical History:  Diagnosis Date   ADD (attention deficit disorder)    Anemia    Anxiety    Arthritis    shoulder, right (arthritis, tendonitis, bursitis)   Cancer (HCC)    cervicsl 1982   Chronic low back pain    Complication of anesthesia    Dyspnea    Fatty liver    GERD (gastroesophageal reflux disease)    History of cervical cancer    s/p  laser ablation of cervix 1980's   History of COVID-19 07/2019   History of kidney stones    Hyperlipidemia    Hypertension    Hypothyroidism    IBS (irritable bowel syndrome)    MDD (major depressive disorder)    Migraine    OSA (obstructive sleep apnea)    pt used cpap up until 2013 states lost wt and did not need anymore   PONV (postoperative nausea and vomiting)    Restless legs    Sleep apnea    wears CPAP   Type 2 diabetes mellitus (HCC)    Urgency of urination    Vertigo    Vitamin D  deficiency    Vulvar cyst    Past Surgical History:  Procedure Laterality Date   BUNIONECTOMY Right 2004   COLONOSCOPY     CYSTOSCOPY W/ RETROGRADES Bilateral 05/26/2015   Procedure: CYSTOSCOPY WITH RETROGRADE PYELOGRAM;  Surgeon: Donnice Brooks, MD;  Location: [facility];  Service: Urology;  Laterality: Bilateral;   CYSTOSCOPY W/ URETERAL STENT REMOVAL Left 05/26/2015   Procedure: CYSTOSCOPY WITH STENT REMOVAL;  Surgeon: Donnice Brooks, MD;   Location: [facility];  Service: Urology;  Laterality: Left;   CYSTOSCOPY WITH RETROGRADE PYELOGRAM, URETEROSCOPY AND STENT PLACEMENT Left 05/01/2015   Procedure: CYSTOSCOPY WITH RETROGRADE PYELOGRAM AND STENT PLACEMENT;  Surgeon: Donnice Brooks, MD;  Location: [facility];  Service: Urology;  Laterality: Left;   CYSTOSCOPY WITH STENT PLACEMENT Bilateral 05/26/2015   Procedure: CYSTOSCOPY WITH STENT PLACEMENT;  Surgeon: Donnice Brooks, MD;  Location: [facility];  Service: Urology;  Laterality: Bilateral;   CYSTOSCOPY WITH URETEROSCOPY Bilateral 05/26/2015   Procedure: CYSTOSCOPY WITH URETEROSCOPY;  Surgeon: Donnice Brooks, MD;  Location: [facility];  Service: Urology;  Laterality: Bilateral;   CYSTOSCOPY/URETEROSCOPY/HOLMIUM LASER/STENT PLACEMENT Right 06/09/2015   Procedure: CYSTOSCOPY/URETEROSCOPY/STENT PLACEMENT REMOVAL LEFT URETERAL STENT;  Surgeon: Donnice Brooks, MD;  Location: [facility];  Service: Urology;  Laterality: Right;   DIAGNOSTIC LAPAROSCOPY     HOLMIUM LASER APPLICATION Left 05/26/2015   Procedure: HOLMIUM LASER APPLICATION;  Surgeon: Donnice Brooks, MD;  Location: [facility];  Service: Urology;  Laterality: Left;   HOLMIUM LASER APPLICATION Right 06/09/2015   Procedure: HOLMIUM LASER APPLICATION;  Surgeon: Donnice Brooks, MD;  Location: [facility];  Service: Urology;  Laterality: Right;   INSERTION OF MESH N/A 09/05/2016   Procedure: INSERTION OF MESH;  Surgeon: Mitzie DELENA Freund, MD;  Location: [facility];  Service: General;  Laterality: N/A;   LAPAROSCOPIC ASSISTED VAGINAL HYSTERECTOMY  04/23/2002   @[facility]   by Dr Horacio;  With Left Salpingoophorectomy/  Anterior Repair/  Tension Free Tape Sling placement   LAPAROSCOPIC GASTRIC SLEEVE RESECTION N/A 09/07/2021   Procedure: LAPAROSCOPIC GASTRIC SLEEVE RESECTION;  Surgeon: Signe Mitzie LABOR, MD;  Location: [facility];  Service: General;  Laterality: N/A;   LASER ABLATION OF THE  CERVIX  x2  1980's   POSTERIOR LAMINECTOMY / DECOMPRESSION LUMBAR SPINE  02/03/2020   @[facility]   Dr Colon;   L4--5 and fusion   POSTERIOR LUMBAR FUSION  01/21/2019   @[facility]  by Dr Colon;   L5--S1   TUBAL LIGATION  1980's   UPPER GI ENDOSCOPY N/A 09/07/2021   Procedure: UPPER GI ENDOSCOPY;  Surgeon: Signe Mitzie LABOR, MD;  Location: [facility];  Service: General;  Laterality: N/A;   VENTRAL HERNIA REPAIR N/A 09/05/2016   Procedure: LAPAROSCOPIC VENTRAL HERNIA;  Surgeon: Mitzie LABOR Signe, MD;  Location: [facility];  Service: General;  Laterality: N/A;   Social History   Socioeconomic History   Marital status: Married    Spouse name: Not on file   Number of children: Not on file   Years of education: Not on file   Highest education level: Not on file  Occupational History   Not on file  Tobacco Use   Smoking status: Never   Smokeless tobacco: Never  Vaping Use   Vaping status: Never Used  Substance and Sexual Activity   Alcohol use: Not Currently    Comment: socially   Drug use: Never   Sexual activity: Yes  Other Topics Concern   Not on file  Social History Narrative   Not on file   Social Drivers of Health   Financial Resource Strain: Low Risk  (06/08/2021)   Overall Financial Resource Strain (CARDIA)    Difficulty of Paying Living Expenses: Not hard at all  Food Insecurity: No Food Insecurity (06/30/2022)   Hunger Vital Sign    Worried About Running Out of Food in the Last Year: Never true    Ran Out of Food in the Last Year: Never true  Transportation Needs: No Transportation Needs (06/30/2022)   PRAPARE - Administrator, Civil Service (Medical): No    Lack of Transportation (Non-Medical): No  Physical Activity: Sufficiently Active (06/30/2022)   Exercise Vital Sign    Days of Exercise per Week: 7 days    Minutes of Exercise per Session: 30 min  Stress: No Stress Concern Present (06/30/2022)   [employer] of Occupational Health - Occupational Stress Questionnaire     Feeling of Stress : Not at all  Social Connections: Socially Integrated (06/30/2022)   Social Connection and Isolation Panel    Frequency of Communication with Friends and Family: More than three times a week    Frequency of Social Gatherings with Friends and Family: More than three times a week    Attends Religious Services: More than 4 times per year    Active Member of [employer] or Organizations: Yes    Attends Engineer, Structural: More than 4 times per year    Marital Status: Married   Family History  Problem Relation Age of Onset   Alcohol abuse Mother    Lung cancer Mother    Colon cancer Mother    Arthritis Father    Hyperlipidemia Father    Heart disease Father    Hypertension Father    COPD Father    Kidney  disease Paternal Uncle    Cancer Maternal Grandmother        colon   Colon cancer Maternal Grandmother    Rectal cancer Maternal Grandmother    Cancer Paternal Grandmother        breast   Kidney cancer Sister        lung cancer   Cirrhosis Sister    Esophageal cancer Neg Hx    Stomach cancer Neg Hx    Allergies  Allergen Reactions   Keflex  [Cephalexin ] Swelling and Other (See Comments)    Tongue swelling   Prior to Admission medications   Medication Sig Start Date End Date Taking? Authorizing Provider  amphetamine -dextroamphetamine  (ADDERALL) 30 MG tablet Take 1 tablet by mouth 2 (two) times daily. 04/10/24  Yes Burchette, Wolm ORN, MD  atorvastatin  (LIPITOR) 10 MG tablet Take 1 tablet (10 mg total) by mouth daily. 04/05/24  Yes Burchette, Wolm ORN, MD  CALCIUM  CITRATE PO Take 3 tablets by mouth 2 (two) times daily.   Yes [provider]  citalopram  (CELEXA ) 20 MG tablet Take 1 tablet (20 mg total) by mouth daily. 04/05/24  Yes Burchette, Wolm ORN, MD  cyclobenzaprine  (FLEXERIL ) 10 MG tablet Take 1 tablet (10 mg total) by mouth 2 (two) times daily. Patient taking differently: Take 10 mg by mouth 3 (three) times daily as needed for muscle spasms.  09/12/23  Yes   insulin  lispro (HUMALOG  KWIKPEN) 200 UNIT/ML KwikPen Inject 10 Units into the skin 2 (two) times daily with breakfast and supper for glucose over 200. Patient taking differently: Inject 10 Units into the skin 2 (two) times daily as needed. 08/30/23  Yes Burchette, Wolm ORN, MD  levothyroxine  (SYNTHROID ) 50 MCG tablet Take 1 tablet (50 mcg total) by mouth daily before breakfast. 04/05/24  Yes Burchette, Wolm ORN, MD  losartan  (COZAAR ) 50 MG tablet Take 1 tablet (50 mg total) by mouth daily. 04/05/24  Yes Burchette, Wolm ORN, MD  MAGNESIUM  PO Take 700 mg by mouth daily.   Yes [provider]  Multiple Vitamins-Minerals (BARIATRIC MULTIVITAMINS PO) Take 1 tablet by mouth daily.   Yes [provider]  OREGANO PO Take 6,000 mg by mouth daily.   Yes [provider]  oxyCODONE -acetaminophen  (PERCOCET) 10-325 MG tablet take 1 tablet by oral route  every 8 hours as needed (DNF 05/14/24) 05/15/24  Yes   SUMAtriptan  (IMITREX ) 100 MG tablet May repeat in 2 hours if headache persists or recurs.  Do not take more than 2 in 24 hours 07/29/22  Yes Burchette, Wolm ORN, MD  vitamin A 3 MG (10000 UNITS) capsule Take 10,000 Units by mouth daily.   Yes [provider]  Vitamin E 268 MG (400 UNIT) CAPS Take 1 capsule by mouth daily.   Yes [provider]  VITAMIN K PO Take 550 mcg by mouth daily.   Yes [provider]  amphetamine -dextroamphetamine  (ADDERALL) 30 MG tablet Take 1 tablet by mouth 2 (two) times daily. Patient not taking: Reported on 05/03/2024 04/10/24   Micheal Wolm ORN, MD  nortriptyline  (PAMELOR ) 50 MG capsule Take 1 capsule (50 mg total) by mouth 2 (two) times daily. Patient not taking: Reported on 05/03/2024 07/07/23     nortriptyline  (PAMELOR ) 50 MG capsule Take 1 capsule (50 mg total) by mouth 2 (two) times daily. Patient not taking: Reported on 05/03/2024 09/12/23     pantoprazole  (PROTONIX ) 40 MG tablet TAKE 1 TABLET(40 MG) BY MOUTH TWICE  DAILY 02/10/20   Armbruster, Elspeth SQUIBB, MD  No results found.  Positive ROS: All other systems have been reviewed and were otherwise negative with the exception of those mentioned in the HPI and as above.  Physical Exam: General: Alert, no acute distress Cardiovascular: No pedal edema Respiratory: No cyanosis, no use of accessory musculature GI: No organomegaly, abdomen is soft and non-tender Skin: No lesions in the area of chief complaint Neurologic: Sensation intact distally Psychiatric: Patient is competent for consent with normal mood and affect Lymphatic: No axillary or cervical lymphadenopathy  MUSCULOSKELETAL: Right upper extremity - Skin intact - TTP right shoulder - Right shoulder ROM 45 deg forward flexion actively, 60 deg abduction actively, 45 deg external rotation, buttock internal rotation - 4/5 strength forward flexion, abduction, external rotation, belly press - motor and sensation intact to light touch axillary, median, radial and ulnar nerve distribution - 2+ radial pulse   Assessment: Right shoulder massive rotator cuff tear involving the supraspinatus,  infraspinatus and subscapularis tendons as well as partial tearing of the long head of biceps  Plan: Informed consent obtained Patient has been n.p.o. since the night Proceed with right shoulder arthroscopy with repair of rotator cuff and dermal allograft augmentation versus interposition, biceps tenodesis, subacromial decompression and humeral head microfracture Vancomycin  for antibiotic prophylaxis.  TXA 1 g for minimizing postop hematoma formation  UltraSling to be provided today  Oxycodone  prescription x 1 prescription provided today 5 mg and 30 pills  Stool softener postop while taking oxycodone   Interscalene nerve block performed by anesthesiologist for the right upper extremity  Return to clinic in 2 weeks postop for skin check   Rankin LELON Pizza, MD    05/09/2024 6:35 AM

## 2024-05-08 NOTE — Anesthesia Preprocedure Evaluation (Addendum)
 Anesthesia Evaluation  Patient identified by MRN, date of birth, ID band Patient awake    Reviewed: Allergy & Precautions, NPO status , Patient's Chart, lab work & pertinent test results  History of Anesthesia Complications (+) PONV and history of anesthetic complications  Airway Mallampati: III  TM Distance: >3 FB Neck ROM: Full    Dental  (+) Teeth Intact, Dental Advisory Given, Caps   Pulmonary sleep apnea (no CPAP)    Pulmonary exam normal breath sounds clear to auscultation       Cardiovascular hypertension, Pt. on medications Normal cardiovascular exam Rhythm:Regular Rate:Normal  TTE 2023 1. Left ventricular ejection fraction, by estimation, is 60 to 65%. The  left ventricle has normal function. The left ventricle has no regional  wall motion abnormalities. There is mild left ventricular hypertrophy.  Left ventricular diastolic parameters  were normal.   2. Right ventricular systolic function is normal. The right ventricular  size is normal. Tricuspid regurgitation signal is inadequate for assessing  PA pressure.   3. The mitral valve is normal in structure. No evidence of mitral valve  regurgitation.   4. The aortic valve was not well visualized. Aortic valve regurgitation  is mild. No aortic stenosis is present.   5. The inferior vena cava is normal in size with greater than 50%  respiratory variability, suggesting right atrial pressure of 3 mmHg.      Neuro/Psych  Headaches PSYCHIATRIC DISORDERS Anxiety Depression       GI/Hepatic Neg liver ROS,GERD  ,,  Endo/Other  diabetes, Type 2, Insulin  DependentHypothyroidism    Renal/GU Renal disease  negative genitourinary   Musculoskeletal  (+) Arthritis ,    Abdominal   Peds  (+) ATTENTION DEFICIT DISORDER WITHOUT HYPERACTIVITY Hematology  (+) Blood dyscrasia, anemia   Anesthesia Other Findings   Reproductive/Obstetrics                               Anesthesia Physical Anesthesia Plan  ASA: 3  Anesthesia Plan: General and Regional   Post-op Pain Management: Regional block* and Tylenol  PO (pre-op)*   Induction: Intravenous  PONV Risk Score and Plan: 4 or greater and Midazolam , Dexamethasone , Ondansetron  and Treatment may vary due to age or medical condition  Airway Management Planned: Oral ETT  Additional Equipment:   Intra-op Plan:   Post-operative Plan: Extubation in OR  Informed Consent: I have reviewed the patients History and Physical, chart, labs and discussed the procedure including the risks, benefits and alternatives for the proposed anesthesia with the patient or authorized representative who has indicated his/her understanding and acceptance.     Dental advisory given  Plan Discussed with: CRNA  Anesthesia Plan Comments:          Anesthesia Quick Evaluation
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
## 2024-05-09 ENCOUNTER — Other Ambulatory Visit (HOSPITAL_BASED_OUTPATIENT_CLINIC_OR_DEPARTMENT_OTHER): Payer: Self-pay

## 2024-05-09 ENCOUNTER — Ambulatory Visit (HOSPITAL_COMMUNITY): Admitting: Anesthesiology

## 2024-05-09 ENCOUNTER — Encounter (HOSPITAL_COMMUNITY): Payer: Self-pay

## 2024-05-09 ENCOUNTER — Ambulatory Visit (HOSPITAL_COMMUNITY): Admission: RE | Admit: 2024-05-09 | Discharge: 2024-05-09 | Disposition: A | Discharge status: Home / Self Care

## 2024-05-09 ENCOUNTER — Encounter (HOSPITAL_COMMUNITY): Admission: RE | Disposition: A | Payer: Self-pay | Source: Home / Self Care

## 2024-05-09 ENCOUNTER — Ambulatory Visit (HOSPITAL_COMMUNITY): Payer: Self-pay | Admitting: Physician Assistant

## 2024-05-09 DIAGNOSIS — G4733 Obstructive sleep apnea (adult) (pediatric): Secondary | ICD-10-CM | POA: Diagnosis not present

## 2024-05-09 DIAGNOSIS — Z794 Long term (current) use of insulin: Secondary | ICD-10-CM | POA: Insufficient documentation

## 2024-05-09 DIAGNOSIS — I10 Essential (primary) hypertension: Secondary | ICD-10-CM | POA: Diagnosis not present

## 2024-05-09 DIAGNOSIS — M75121 Complete rotator cuff tear or rupture of right shoulder, not specified as traumatic: Secondary | ICD-10-CM | POA: Diagnosis present

## 2024-05-09 DIAGNOSIS — K219 Gastro-esophageal reflux disease without esophagitis: Secondary | ICD-10-CM | POA: Diagnosis not present

## 2024-05-09 DIAGNOSIS — F418 Other specified anxiety disorders: Secondary | ICD-10-CM | POA: Insufficient documentation

## 2024-05-09 DIAGNOSIS — E039 Hypothyroidism, unspecified: Secondary | ICD-10-CM | POA: Insufficient documentation

## 2024-05-09 DIAGNOSIS — F988 Other specified behavioral and emotional disorders with onset usually occurring in childhood and adolescence: Secondary | ICD-10-CM | POA: Diagnosis not present

## 2024-05-09 DIAGNOSIS — M75101 Unspecified rotator cuff tear or rupture of right shoulder, not specified as traumatic: Secondary | ICD-10-CM

## 2024-05-09 DIAGNOSIS — M65821 Other synovitis and tenosynovitis, right upper arm: Secondary | ICD-10-CM | POA: Insufficient documentation

## 2024-05-09 DIAGNOSIS — M7551 Bursitis of right shoulder: Secondary | ICD-10-CM | POA: Insufficient documentation

## 2024-05-09 DIAGNOSIS — E119 Type 2 diabetes mellitus without complications: Secondary | ICD-10-CM | POA: Diagnosis not present

## 2024-05-09 HISTORY — PX: SHOULDER ARTHROSCOPY WITH ROTATOR CUFF REPAIR: SHX5685

## 2024-05-09 LAB — GLUCOSE, CAPILLARY
Glucose-Capillary: 96 mg/dL (ref 70–99)
Glucose-Capillary: 135 mg/dL — ABNORMAL HIGH (ref 70–99)

## 2024-05-09 SURGERY — ARTHROSCOPY, SHOULDER, WITH ROTATOR CUFF REPAIR
Anesthesia: Regional | Laterality: Right

## 2024-05-09 MED ORDER — BUPIVACAINE HCL (PF) 0.5 % IJ SOLN
INTRAMUSCULAR | Status: DC | PRN
  Administered 2024-05-09: 15 mL via PERINEURAL

## 2024-05-09 MED ORDER — DEXAMETHASONE SOD PHOSPHATE PF 10 MG/ML IJ SOLN
INTRAMUSCULAR | Status: DC | PRN
  Administered 2024-05-09: 5 mg via INTRAVENOUS

## 2024-05-09 MED ORDER — OXYCODONE HCL 5 MG/5ML PO SOLN: 5.0000 mg | Freq: Once | ORAL | Status: DC | PRN

## 2024-05-09 MED ORDER — ORAL CARE MOUTH RINSE: 15.0000 mL | Freq: Once | OROMUCOSAL | Status: AC

## 2024-05-09 MED ORDER — OXYCODONE HCL 5 MG PO TABS
5.0000 mg | ORAL_TABLET | Freq: Four times a day (QID) | ORAL | 0 refills | Status: AC | PRN
  Filled 2024-05-09 (×2): qty 30, 8d supply, fill #0

## 2024-05-09 MED ORDER — LIDOCAINE HCL (CARDIAC) PF 100 MG/5ML IV SOSY
PREFILLED_SYRINGE | INTRAVENOUS | Status: DC | PRN
  Administered 2024-05-09: 20 mg via INTRAVENOUS

## 2024-05-09 MED ORDER — ONDANSETRON HCL 4 MG/2ML IJ SOLN
INTRAMUSCULAR | Status: AC
  Filled 2024-05-09: qty 2

## 2024-05-09 MED ORDER — ROCURONIUM BROMIDE 100 MG/10ML IV SOLN
INTRAVENOUS | Status: DC | PRN
  Administered 2024-05-09: 50 mg via INTRAVENOUS

## 2024-05-09 MED ORDER — VANCOMYCIN HCL IN DEXTROSE 1-5 GM/200ML-% IV SOLN
INTRAVENOUS | Status: AC
  Administered 2024-05-09: 1000 mg via INTRAVENOUS
  Filled 2024-05-09: qty 200

## 2024-05-09 MED ORDER — FENTANYL CITRATE (PF) 100 MCG/2ML IJ SOLN
INTRAMUSCULAR | Status: AC
  Filled 2024-05-09: qty 2

## 2024-05-09 MED ORDER — LACTATED RINGERS IV SOLN: INTRAVENOUS | Status: DC

## 2024-05-09 MED ORDER — ONDANSETRON HCL 4 MG/2ML IJ SOLN
INTRAMUSCULAR | Status: DC | PRN
  Administered 2024-05-09: 4 mg via INTRAVENOUS

## 2024-05-09 MED ORDER — TRANEXAMIC ACID-NACL 1000-0.7 MG/100ML-% IV SOLN
1000.0000 mg | INTRAVENOUS | Status: AC
  Administered 2024-05-09: 1000 mg via INTRAVENOUS
  Filled 2024-05-09: qty 100

## 2024-05-09 MED ORDER — FENTANYL CITRATE (PF) 100 MCG/2ML IJ SOLN
INTRAMUSCULAR | Status: DC | PRN
  Administered 2024-05-09: 50 ug via INTRAVENOUS
  Administered 2024-05-09: 25 ug via INTRAVENOUS
  Administered 2024-05-09: 50 ug via INTRAVENOUS

## 2024-05-09 MED ORDER — BACITRACIN ZINC 500 UNIT/GM EX OINT
TOPICAL_OINTMENT | CUTANEOUS | Status: AC
  Filled 2024-05-09: qty 28.35

## 2024-05-09 MED ORDER — PROPOFOL 10 MG/ML IV BOLUS
INTRAVENOUS | Status: AC
  Filled 2024-05-09: qty 20

## 2024-05-09 MED ORDER — ACETAMINOPHEN 500 MG PO TABS
1000.0000 mg | ORAL_TABLET | Freq: Once | ORAL | Status: AC
  Administered 2024-05-09: 1000 mg via ORAL
  Filled 2024-05-09: qty 2

## 2024-05-09 MED ORDER — PHENYLEPHRINE 80 MCG/ML (10ML) SYRINGE FOR IV PUSH (FOR BLOOD PRESSURE SUPPORT)
PREFILLED_SYRINGE | INTRAVENOUS | Status: DC | PRN
  Administered 2024-05-09 (×3): 80 ug via INTRAVENOUS
  Administered 2024-05-09: 160 ug via INTRAVENOUS

## 2024-05-09 MED ORDER — SUGAMMADEX SODIUM 200 MG/2ML IV SOLN
INTRAVENOUS | Status: DC | PRN
  Administered 2024-05-09: 200 mg via INTRAVENOUS

## 2024-05-09 MED ORDER — 0.9 % SODIUM CHLORIDE (POUR BTL) OPTIME
TOPICAL | Status: DC | PRN
Start: 2024-05-09 — End: 2024-05-09
  Administered 2024-05-09: 1000 mL

## 2024-05-09 MED ORDER — CARMEX CLASSIC LIP BALM EX OINT
TOPICAL_OINTMENT | CUTANEOUS | Status: AC
  Filled 2024-05-09: qty 10

## 2024-05-09 MED ORDER — LEVOFLOXACIN IN D5W 500 MG/100ML IV SOLN
500.0000 mg | INTRAVENOUS | Status: DC
  Filled 2024-05-09: qty 100

## 2024-05-09 MED ORDER — OXYCODONE HCL 5 MG PO TABS: 5.0000 mg | ORAL_TABLET | Freq: Once | ORAL | Status: DC | PRN

## 2024-05-09 MED ORDER — SODIUM CHLORIDE 0.9 % IR SOLN
Status: DC | PRN
  Administered 2024-05-09: 45000 mL

## 2024-05-09 MED ORDER — CHLORHEXIDINE GLUCONATE 0.12 % MT SOLN
15.0000 mL | Freq: Once | OROMUCOSAL | Status: AC
  Administered 2024-05-09: 15 mL via OROMUCOSAL

## 2024-05-09 MED ORDER — FENTANYL CITRATE (PF) 50 MCG/ML IJ SOSY
PREFILLED_SYRINGE | INTRAMUSCULAR | Status: AC
  Filled 2024-05-09: qty 1

## 2024-05-09 MED ORDER — AMISULPRIDE (ANTIEMETIC) 5 MG/2ML IV SOLN: 10.0000 mg | Freq: Once | INTRAVENOUS | Status: DC | PRN

## 2024-05-09 MED ORDER — VANCOMYCIN HCL IN DEXTROSE 1-5 GM/200ML-% IV SOLN
1000.0000 mg | INTRAVENOUS | Status: AC
  Filled 2024-05-09: qty 200

## 2024-05-09 MED ORDER — INSULIN ASPART 100 UNIT/ML IJ SOLN: 0.0000 [IU] | INTRAMUSCULAR | Status: DC | PRN

## 2024-05-09 MED ORDER — PHENYLEPHRINE HCL (PRESSORS) 10 MG/ML IV SOLN
INTRAVENOUS | Status: AC
  Filled 2024-05-09: qty 1

## 2024-05-09 MED ORDER — SODIUM CHLORIDE 0.9 % IR SOLN
Status: DC | PRN
  Administered 2024-05-09 (×3): 3000 mL

## 2024-05-09 MED ORDER — ROCURONIUM BROMIDE 10 MG/ML (PF) SYRINGE
PREFILLED_SYRINGE | INTRAVENOUS | Status: AC
  Filled 2024-05-09: qty 10

## 2024-05-09 MED ORDER — BUPIVACAINE-EPINEPHRINE (PF) 0.25% -1:200000 IJ SOLN
INTRAMUSCULAR | Status: AC
  Filled 2024-05-09: qty 30

## 2024-05-09 MED ORDER — SUGAMMADEX SODIUM 200 MG/2ML IV SOLN
INTRAVENOUS | Status: AC
  Filled 2024-05-09: qty 2

## 2024-05-09 MED ORDER — MIDAZOLAM HCL 5 MG/5ML IJ SOLN
INTRAMUSCULAR | Status: DC | PRN
  Administered 2024-05-09: 2 mg via INTRAVENOUS

## 2024-05-09 MED ORDER — PHENYLEPHRINE HCL-NACL 20-0.9 MG/250ML-% IV SOLN
INTRAVENOUS | Status: DC | PRN
  Administered 2024-05-09: 20 ug/min via INTRAVENOUS

## 2024-05-09 MED ORDER — PROPOFOL 10 MG/ML IV BOLUS
INTRAVENOUS | Status: DC | PRN
Start: 2024-05-09 — End: 2024-05-09
  Administered 2024-05-09: 30 mg via INTRAVENOUS
  Administered 2024-05-09: 20 mg via INTRAVENOUS
  Administered 2024-05-09: 120 mg via INTRAVENOUS
  Administered 2024-05-09: 20 mg via INTRAVENOUS
  Administered 2024-05-09: 10 mg via INTRAVENOUS

## 2024-05-09 MED ORDER — EPINEPHRINE PF 1 MG/ML IJ SOLN
INTRAMUSCULAR | Status: AC
  Filled 2024-05-09: qty 3

## 2024-05-09 MED ORDER — PHENYLEPHRINE HCL-NACL 20-0.9 MG/250ML-% IV SOLN: INTRAVENOUS | Status: DC | PRN

## 2024-05-09 MED ORDER — MIDAZOLAM HCL 2 MG/2ML IJ SOLN
INTRAMUSCULAR | Status: AC
  Filled 2024-05-09: qty 2

## 2024-05-09 MED ORDER — FENTANYL CITRATE (PF) 50 MCG/ML IJ SOSY
25.0000 ug | PREFILLED_SYRINGE | INTRAMUSCULAR | Status: DC | PRN
  Administered 2024-05-09 (×2): 50 ug via INTRAVENOUS

## 2024-05-09 MED ORDER — LIDOCAINE HCL (PF) 2 % IJ SOLN
INTRAMUSCULAR | Status: AC
  Filled 2024-05-09: qty 5

## 2024-05-09 MED ORDER — SENNOSIDES-DOCUSATE SODIUM 8.6-50 MG PO TABS
1.0000 | ORAL_TABLET | Freq: Every day | ORAL | 0 refills | Status: AC
  Filled 2024-05-09: qty 30, 30d supply, fill #0

## 2024-05-09 MED ORDER — BUPIVACAINE LIPOSOME 1.3 % IJ SUSP
INTRAMUSCULAR | Status: DC | PRN
  Administered 2024-05-09: 10 mL via PERINEURAL

## 2024-05-09 SURGICAL SUPPLY — 58 items
ANCHOR CORKSCREW BIO 5.5 (Anchor) IMPLANT
BENZOIN TINCTURE PRP APPL 2/3 (GAUZE/BANDAGES/DRESSINGS) IMPLANT
BLADE AVERAGE 25X9 (BLADE) IMPLANT
BLADE CLIPPER SURG (BLADE) IMPLANT
BLADE EXCALIBUR 4.0X13 (MISCELLANEOUS) IMPLANT
BLADE SURG 15 STRL LF DISP TIS (BLADE) ×2 IMPLANT
BURR OVAL 8 FLU 4.0X13 (MISCELLANEOUS) ×2 IMPLANT
CANNULA 5.75X71 LONG (CANNULA) IMPLANT
CANNULA TWIST IN 8.25X7CM (CANNULA) IMPLANT
CLSR STERI-STRIP ANTIMIC 1/2X4 (GAUZE/BANDAGES/DRESSINGS) IMPLANT
CUTTER BONE 4.0MM X 13CM (MISCELLANEOUS) ×2 IMPLANT
DISSECTOR 3.8MM X 13CM (MISCELLANEOUS) IMPLANT
DRAPE IMP U-DRAPE 54X76 (DRAPES) ×2 IMPLANT
DRAPE INCISE IOBAN 66X45 STRL (DRAPES) ×2 IMPLANT
DRAPE STERI 35X30 U-POUCH (DRAPES) ×2 IMPLANT
DRAPE U-SHAPE 47X51 STRL (DRAPES) ×2 IMPLANT
DRAPE U-SHAPE 76X120 STRL (DRAPES) IMPLANT
DRSG EMULSION OIL 3X3 NADH (GAUZE/BANDAGES/DRESSINGS) ×2 IMPLANT
DRSG TEGADERM 8X12 (GAUZE/BANDAGES/DRESSINGS) IMPLANT
DURAPREP 26ML APPLICATOR (WOUND CARE) ×2 IMPLANT
ELECT NDL TIP 2.8 STRL (NEEDLE) ×2 IMPLANT
ELECT NEEDLE TIP 2.8 STRL (NEEDLE) ×1 IMPLANT
ELECTRODE REM PT RTRN 9FT ADLT (ELECTROSURGICAL) ×2 IMPLANT
GAUZE PAD ABD 8X10 STRL (GAUZE/BANDAGES/DRESSINGS) IMPLANT
GAUZE SPONGE 4X4 12PLY STRL (GAUZE/BANDAGES/DRESSINGS) ×2 IMPLANT
GAUZE XEROFORM 1X8 LF (GAUZE/BANDAGES/DRESSINGS) IMPLANT
GLOVE BIOGEL PI IND STRL 8 (GLOVE) ×2 IMPLANT
GLOVE SS BIOGEL STRL SZ 8 (GLOVE) ×6 IMPLANT
GOWN STRL REUS W/TWL XL LVL3 (GOWN DISPOSABLE) ×2 IMPLANT
GRAFT TISS 40X70 3 THK DERM (Tissue) IMPLANT
KIT BASIN OR (CUSTOM PROCEDURE TRAY) ×2 IMPLANT
NDL HD SCORPION MEGA LOADER (NEEDLE) IMPLANT
NDL MAYO 6 CRC TAPER PT (NEEDLE) IMPLANT
NDL MAYO CATGUT SZ4 TPR NDL (NEEDLE) IMPLANT
NEEDLE MAYO 6 CRC TAPER PT (NEEDLE) IMPLANT
NEEDLE MAYO CATGUT SZ4 (NEEDLE) IMPLANT
PACK ARTHROSCOPY DSU (CUSTOM PROCEDURE TRAY) ×2 IMPLANT
PENCIL SMOKE EVACUATOR (MISCELLANEOUS) IMPLANT
SLING ARM FOAM STRAP LRG (SOFTGOODS) IMPLANT
SOLN 0.9% NACL POUR BTL 1000ML (IV SOLUTION) ×2 IMPLANT
SPIKE FLUID TRANSFER (MISCELLANEOUS) IMPLANT
SPONGE T-LAP 4X18 ~~LOC~~+RFID (SPONGE) IMPLANT
STAPLER SKIN PROX WIDE 3.9 (STAPLE) IMPLANT
STRIP CLOSURE SKIN 1/2X4 (GAUZE/BANDAGES/DRESSINGS) IMPLANT
SUT BONE WAX W31G (SUTURE) IMPLANT
SUT MNCRL AB 4-0 PS2 18 (SUTURE) IMPLANT
SUT PDS AB 0 CT1 36 (SUTURE) IMPLANT
SUT VIC AB 0 CT1 18XCR BRD 8 (SUTURE) IMPLANT
SUT VIC AB 0 CT1 27XBRD ANBCTR (SUTURE) IMPLANT
SUT VIC AB 0 CT2 27 (SUTURE) IMPLANT
SUT VIC AB 2-0 CT1 TAPERPNT 27 (SUTURE) IMPLANT
SUTURE TAPE 1.3 40 TPR END (SUTURE) IMPLANT
SYR BULB EAR ULCER 3OZ GRN STR (SYRINGE) IMPLANT
TOWEL GREEN STERILE FF (TOWEL DISPOSABLE) ×2 IMPLANT
TUBE CONNECTING 20X1/4 (TUBING) ×2 IMPLANT
TUBING ARTHROSCOPY IRRIG 16FT (MISCELLANEOUS) ×2 IMPLANT
WAND ABLATOR APOLLO I90 (BUR) ×2 IMPLANT
YANKAUER SUCT BULB TIP NO VENT (SUCTIONS) IMPLANT

## 2024-05-09 NOTE — Transfer of Care (Signed)
 Immediate Anesthesia Transfer of Care Note  Patient: [name] Sherman  Procedure(s) Performed: ARTHROSCOPY, SHOULDER, WITH ROTATOR CUFF REPAIR (Right)  Patient Location: PACU  Anesthesia Type:General  Level of Consciousness: awake, alert , oriented, and patient cooperative  Airway & Oxygen Therapy: Patient Spontanous Breathing and Patient connected to face mask oxygen  Post-op Assessment: Report given to RN and Post -op Vital signs reviewed and stable  Post vital signs: Reviewed and stable  Last Vitals:  Vitals Value Taken Time  BP 134/63 05/09/24 13:00  Temp    Pulse 84 05/09/24 13:05  Resp 27 05/09/24 13:05  SpO2 100 % 05/09/24 13:05  Vitals shown include unfiled device data.  Last Pain:  Vitals:   05/09/24 0600  TempSrc: Oral         Complications: There were no known notable events for this encounter.

## 2024-05-09 NOTE — Anesthesia Procedure Notes (Signed)
 Procedure Name: Intubation Date/Time: 05/09/2024 7:41 AM  Performed by: Erick Fitz, CRNAPre-anesthesia Checklist: Patient identified, Emergency Drugs available, Suction available, Patient being monitored and Timeout performed Patient Re-evaluated:Patient Re-evaluated prior to induction Oxygen Delivery Method: Circle system utilized Preoxygenation: Pre-oxygenation with 100% oxygen Induction Type: IV induction Ventilation: Mask ventilation without difficulty Laryngoscope Size: Mac and 3 Grade View: Grade I Tube type: Oral Tube size: 7.0 mm Number of attempts: 1 Airway Equipment and Method: Stylet Placement Confirmation: ETT inserted through vocal cords under direct vision, positive ETCO2, CO2 detector and breath sounds checked- equal and bilateral Secured at: 22 cm Tube secured with: Tape (secured with pink Hy-tape) Dental Injury: Teeth and Oropharynx as per pre-operative assessment

## 2024-05-09 NOTE — Discharge Instructions (Addendum)
 Orthopedic surgery discharge instructions:  Bandages: -Maintain postoperative bandage until follow-up appointment.  - Do not use any lotions or creams on or around the incision until instructed by your surgeon.  Showering -  This is waterproof, and you may begin showering on postoperative day #3. You should keep the bandage in place wrap it in plastic wrap at that point prior to showering  - Do not submerge the surgical site underwater.    Activity Restrictions -No lifting over 2 pounds with operative arm.   - Maintain sling until follow up. You may come out of the sling for activities of daily living such as bathing, washing your face and brushing your teeth, eating, and getting dressed.  Otherwise maintain your sling when you are out of the house and sleeping. - You should do elbow and wrist range of motion, but no motion of the shoulder  Medications - For mild to moderate pain use Tylenol  as needed around-the-clock, 1000 mg every 8 h for pain, but not to exceed greater than 4000 mg daily in combination with your other medications. Do not take more than this higher levels can affect kidney function - For breakthrough pain use oxycodone  as necessary. - Please take a stool softener such as docusate and senna while taking a narcotic pain medication  Follow Up:  -You will return to see Dr. Teresa in the office in 2 weeks for routine postoperative check with x-rays. PT will begin after this visit.

## 2024-05-09 NOTE — Anesthesia Procedure Notes (Signed)
 Anesthesia Regional Block: Interscalene brachial plexus block   Pre-Anesthetic Checklist: , timeout performed,  Correct Patient, Correct Site, Correct Laterality,  Correct Procedure, Correct Position, site marked,  Risks and benefits discussed,  Pre-op evaluation,  At surgeon's request and post-op pain management  Laterality: Right  Prep: Maximum Sterile Barrier Precautions used, chloraprep       Needles:  Injection technique: Single-shot  Needle Type: Echogenic Stimulator Needle     Needle Length: 5cm  Needle Gauge: 21     Additional Needles:   Procedures:,,,, ultrasound used (permanent image in chart),,    Narrative:  Start time: 05/09/2024 7:10 AM End time: 05/09/2024 7:14 AM Injection made incrementally with aspirations every 5 mL. Anesthesiologist: Niels Marien CROME, MD

## 2024-05-09 NOTE — Anesthesia Postprocedure Evaluation (Signed)
 Anesthesia Post Note  Patient: [name] Sherman  Procedure(s) Performed: ARTHROSCOPY, SHOULDER, WITH ROTATOR CUFF REPAIR (Right)     Patient location during evaluation: PACU Anesthesia Type: Regional and General Level of consciousness: awake and alert Pain management: pain level controlled Vital Signs Assessment: post-procedure vital signs reviewed and stable Respiratory status: spontaneous breathing, nonlabored ventilation, respiratory function stable and patient connected to nasal cannula oxygen Cardiovascular status: blood pressure returned to baseline and stable Postop Assessment: no apparent nausea or vomiting Anesthetic complications: no   There were no known notable events for this encounter.  Last Vitals:  Vitals:   05/09/24 1543 05/09/24 1545  BP: 99/69 114/64  Pulse:  71  Resp:  16  Temp:    SpO2:  92%    Last Pain:  Vitals:   05/09/24 1545  TempSrc:   PainSc: 0-No pain                 Laird Runnion L Sonal Dorwart

## 2024-05-10 ENCOUNTER — Encounter (HOSPITAL_COMMUNITY): Payer: Self-pay

## 2024-05-13 ENCOUNTER — Other Ambulatory Visit (HOSPITAL_BASED_OUTPATIENT_CLINIC_OR_DEPARTMENT_OTHER): Payer: Self-pay

## 2024-05-14 ENCOUNTER — Other Ambulatory Visit (HOSPITAL_BASED_OUTPATIENT_CLINIC_OR_DEPARTMENT_OTHER): Payer: Self-pay

## 2024-05-21 ENCOUNTER — Ambulatory Visit (INDEPENDENT_AMBULATORY_CARE_PROVIDER_SITE_OTHER)

## 2024-05-21 VITALS — Ht 64.0 in | Wt 203.0 lb

## 2024-05-21 DIAGNOSIS — Z0000 Encounter for general adult medical examination without abnormal findings: Secondary | ICD-10-CM | POA: Diagnosis not present

## 2024-05-21 NOTE — Patient Instructions (Addendum)
 [name] Sherman,  Thank you for taking the time for your Medicare Wellness Visit. I appreciate your continued commitment to your health goals. Please review the care plan we discussed, and feel free to reach out if I can assist you further.  Please note that Annual Wellness Visits do not include a physical exam. Some assessments may be limited, especially if the visit was conducted virtually. If needed, we may recommend an in-person follow-up with your provider.  Ongoing Care Seeing your primary care provider every 3 to 6 months helps us  monitor your health and provide consistent, personalized care. Next office visit on 07/08/2024.  You are due for a flu vaccine, a pneumonia vaccine, and a 2nd Shingrix vaccine.  These can be given at your local pharmacy.  You are also due for a pap smear.  Wishing you to get well soon and Happy Holidays ahead.  Referrals If a referral was made during today's visit and you haven't received any updates within two weeks, please contact the referred provider directly to check on the status.  Recommended Screenings:  Health Maintenance  Topic Date Due   Complete foot exam   Never done   Pneumococcal Vaccine for age over 31 (1 of 2 - PCV) Never done   Pap with HPV screening  12/05/2015   Zoster (Shingles) Vaccine (2 of 2) 05/11/2021   Flu Shot  01/26/2024   COVID-19 Vaccine (6 - 2025-26 season) 02/26/2024   Eye exam for diabetics  08/06/2024   Yearly kidney health urinalysis for diabetes  08/29/2024   Hemoglobin A1C  10/09/2024   Yearly kidney function blood test for diabetes  04/10/2025   Medicare Annual Wellness Visit  05/21/2025   Breast Cancer Screening  04/30/2026   Colon Cancer Screening  05/06/2026   DTaP/Tdap/Td vaccine (3 - Td or Tdap) 03/21/2029   Hepatitis C Screening  Completed   HIV Screening  Completed   Hepatitis B Vaccine  Aged Out   HPV Vaccine  Aged Out   Meningitis B Vaccine  Aged Out       05/21/2024    1:13 PM  Advanced Directives   Does Patient Have a Medical Advance Directive? No    Vision: Annual vision screenings are recommended for early detection of glaucoma, cataracts, and diabetic retinopathy. These exams can also reveal signs of chronic conditions such as diabetes and high blood pressure.  Dental: Annual dental screenings help detect early signs of oral cancer, gum disease, and other conditions linked to overall health, including heart disease and diabetes.  Please see the attached documents for additional preventive care recommendations.

## 2024-05-21 NOTE — Progress Notes (Deleted)
 Chief Complaint  Patient presents with   Medicare Wellness     Subjective:   [name] Sherman is a 63 y.o. female who presents for a Medicare Annual Wellness Visit.  Allergies (verified) Keflex  [cephalexin ]   History: Past Medical History:  Diagnosis Date   ADD (attention deficit disorder)    Anemia    Anxiety    Arthritis    shoulder, right (arthritis, tendonitis, bursitis)   Cancer (HCC)    cervicsl 1982   Chronic low back pain    Complication of anesthesia    Dyspnea    Fatty liver    GERD (gastroesophageal reflux disease)    History of cervical cancer    s/p  laser ablation of cervix 1980's   History of COVID-19 07/2019   History of kidney stones    Hyperlipidemia    Hypertension    Hypothyroidism    IBS (irritable bowel syndrome)    MDD (major depressive disorder)    Migraine    OSA (obstructive sleep apnea)    pt used cpap up until 2013 states lost wt and did not need anymore   PONV (postoperative nausea and vomiting)    Restless legs    Sleep apnea    wears CPAP   Type 2 diabetes mellitus (HCC)    Urgency of urination    Vertigo    Vitamin D  deficiency    Vulvar cyst    Past Surgical History:  Procedure Laterality Date   BUNIONECTOMY Right 2004   COLONOSCOPY     CYSTOSCOPY W/ RETROGRADES Bilateral 05/26/2015   Procedure: CYSTOSCOPY WITH RETROGRADE PYELOGRAM;  Surgeon: Donnice Brooks, MD;  Location: [facility];  Service: Urology;  Laterality: Bilateral;   CYSTOSCOPY W/ URETERAL STENT REMOVAL Left 05/26/2015   Procedure: CYSTOSCOPY WITH STENT REMOVAL;  Surgeon: Donnice Brooks, MD;  Location: [facility];  Service: Urology;  Laterality: Left;   CYSTOSCOPY WITH RETROGRADE PYELOGRAM, URETEROSCOPY AND STENT PLACEMENT Left 05/01/2015   Procedure: CYSTOSCOPY WITH RETROGRADE PYELOGRAM AND STENT PLACEMENT;  Surgeon: Donnice Brooks, MD;  Location: [facility];  Service: Urology;  Laterality: Left;   CYSTOSCOPY WITH STENT  PLACEMENT Bilateral 05/26/2015   Procedure: CYSTOSCOPY WITH STENT PLACEMENT;  Surgeon: Donnice Brooks, MD;  Location: [facility];  Service: Urology;  Laterality: Bilateral;   CYSTOSCOPY WITH URETEROSCOPY Bilateral 05/26/2015   Procedure: CYSTOSCOPY WITH URETEROSCOPY;  Surgeon: Donnice Brooks, MD;  Location: [facility];  Service: Urology;  Laterality: Bilateral;   CYSTOSCOPY/URETEROSCOPY/HOLMIUM LASER/STENT PLACEMENT Right 06/09/2015   Procedure: CYSTOSCOPY/URETEROSCOPY/STENT PLACEMENT REMOVAL LEFT URETERAL STENT;  Surgeon: Donnice Brooks, MD;  Location: [facility];  Service: Urology;  Laterality: Right;   DIAGNOSTIC LAPAROSCOPY     HOLMIUM LASER APPLICATION Left 05/26/2015   Procedure: HOLMIUM LASER APPLICATION;  Surgeon: Donnice Brooks, MD;  Location: [facility];  Service: Urology;  Laterality: Left;   HOLMIUM LASER APPLICATION Right 06/09/2015   Procedure: HOLMIUM LASER APPLICATION;  Surgeon: Donnice Brooks, MD;  Location: [facility];  Service: Urology;  Laterality: Right;   INSERTION OF MESH N/A 09/05/2016   Procedure: INSERTION OF MESH;  Surgeon: Mitzie DELENA Freund, MD;  Location: [facility];  Service: General;  Laterality: N/A;   LAPAROSCOPIC ASSISTED VAGINAL HYSTERECTOMY  04/23/2002   @[facility]   by Dr Horacio;  With Left Salpingoophorectomy/  Anterior Repair/  Tension Free Tape Sling placement   LAPAROSCOPIC GASTRIC SLEEVE RESECTION N/A 09/07/2021   Procedure: LAPAROSCOPIC GASTRIC SLEEVE RESECTION;  Surgeon: Freund Mitzie  A, MD;  Location: [facility];  Service: General;  Laterality: N/A;   LASER ABLATION OF THE CERVIX  x2  1980's   POSTERIOR LAMINECTOMY / DECOMPRESSION LUMBAR SPINE  02/03/2020   @[facility]   Dr Colon;   L4--5 and fusion   POSTERIOR LUMBAR FUSION  01/21/2019   @[facility]  by Dr Colon;   L5--S1   SHOULDER ARTHROSCOPY WITH ROTATOR CUFF REPAIR Right 05/09/2024   Procedure: ARTHROSCOPY, SHOULDER, WITH ROTATOR CUFF REPAIR;  Surgeon: Teresa Rankin ORN, MD;   Location: [facility];  Service: Orthopedics;  Laterality: Right;  right shoulder arthroscopy rotator cuff repair, bicep tenodesis, subacromial decompression, extensive debridement   TUBAL LIGATION  1980's   UPPER GI ENDOSCOPY N/A 09/07/2021   Procedure: UPPER GI ENDOSCOPY;  Surgeon: Signe Mitzie LABOR, MD;  Location: [facility];  Service: General;  Laterality: N/A;   VENTRAL HERNIA REPAIR N/A 09/05/2016   Procedure: LAPAROSCOPIC VENTRAL HERNIA;  Surgeon: Mitzie LABOR Signe, MD;  Location: [facility];  Service: General;  Laterality: N/A;   Family History  Problem Relation Age of Onset   Alcohol abuse Mother    Lung cancer Mother    Colon cancer Mother    Arthritis Father    Hyperlipidemia Father    Heart disease Father    Hypertension Father    COPD Father    Kidney disease Paternal Uncle    Cancer Maternal Grandmother        colon   Colon cancer Maternal Grandmother    Rectal cancer Maternal Grandmother    Cancer Paternal Grandmother        breast   Kidney cancer Sister        lung cancer   Cirrhosis Sister    Esophageal cancer Neg Hx    Stomach cancer Neg Hx    Social History   Occupational History   Occupation: Veterinary Surgeon  Tobacco Use   Smoking status: Never   Smokeless tobacco: Never  Vaping Use   Vaping status: Never Used  Substance and Sexual Activity   Alcohol use: Not Currently    Comment: socially   Drug use: Never   Sexual activity: Yes   Tobacco Counseling Counseling given: Not Answered  SDOH Screenings   Food Insecurity: No Food Insecurity (05/21/2024)  Housing: Unknown (05/21/2024)  Transportation Needs: No Transportation Needs (05/21/2024)  Utilities: Not At Risk (05/21/2024)  Alcohol Screen: Low Risk  (06/30/2022)  Depression (PHQ2-9): Medium Risk (05/21/2024)  Financial Resource Strain: Low Risk  (06/08/2021)  Physical Activity: Inactive (05/21/2024)  Social Connections: Moderately Isolated (05/21/2024)  Stress: No Stress Concern Present (05/21/2024)  Tobacco  Use: Low Risk  (05/21/2024)  Health Literacy: Adequate Health Literacy (05/21/2024)   See flowsheets for full screening details  Depression Screen PHQ 2 & 9 Depression Scale- Over the past 2 weeks, how often have you been bothered by any of the following problems? Little interest or pleasure in doing things: 0 Feeling down, depressed, or hopeless (PHQ Adolescent also includes...irritable): 1 (due to father death) PHQ-2 Total Score: 1 Trouble falling or staying asleep, or sleeping too much: 1 (due to shoulder surgery) Feeling tired or having little energy: 1 Poor appetite or overeating (PHQ Adolescent also includes...weight loss): 3 (poor appetite) Feeling bad about yourself - or that you are a failure or have let yourself or your family down: 0 Trouble concentrating on things, such as reading the newspaper or watching television (PHQ Adolescent also includes...like school work): 0 Moving or speaking so slowly that other people could have noticed.  Or the opposite - being so fidgety or restless that you have been moving around a lot more than usual: 0 Thoughts that you would be better off dead, or of hurting yourself in some way: 0 PHQ-9 Total Score: 6 If you checked off any problems, how difficult have these problems made it for you to do your work, take care of things at home, or get along with other people?: Not difficult at all  Depression Treatment Depression Interventions/Treatment : EYV7-0 Score <4 Follow-up Not Indicated     Goals Addressed               This Visit's Progress     Patient stated (pt-stated)        I want to get stronger in my legs and arms./2025       Visit info / Clinical Intake: Medicare Wellness Visit Type:: Subsequent Annual Wellness Visit Persons participating in visit:: patient Medicare Wellness Visit Mode:: Video Because this visit was a virtual/telehealth visit:: vitals recorded from last visit If Telephone or Video please confirm:: I connected  with the patient using audio enabled telemedicine application and verified that I am speaking with the correct person using two identifiers; I discussed the limitations of evaluation and management by telemedicine; The patient expressed understanding and agreed to proceed Patient Location:: Home Provider Location:: Home Information given by:: patient Interpreter Needed?: No Pre-visit prep was completed: no AWV questionnaire completed by patient prior to visit?: no Living arrangements:: lives with spouse/significant other Patient's Overall Health Status Rating: very good Typical amount of pain: some Does pain affect daily life?: (!) yes Are you currently prescribed opioids?: (!) yes  Dietary Habits and Nutritional Risks How many meals a day?: (!) 1 Eats fruit and vegetables daily?: (!) no Most meals are obtained by: eating out In the last 2 weeks, have you had any of the following?: none (diarrhea) Diabetic:: (!) yes Any non-healing wounds?: no How often do you check your BS?: as needed Would you like to be referred to a Nutritionist or for Diabetic Management? : no  Functional Status Activities of Daily Living (to include ambulation/medication): Independent Ambulation: Independent with device- listed below Home Assistive Devices/Equipment: Eyeglasses Medication Administration: Independent Home Management: Independent Manage your own finances?: yes Primary transportation is: driving Concerns about vision?: no *vision screening is required for WT[facility]* Concerns about hearing?: no  Fall Screening Falls in the past year?: 0 Number of falls in past year: 0 Was there an injury with Fall?: 0 Fall Risk Category Calculator: 0 Patient Fall Risk Level: Low Fall Risk  Fall Risk Patient at Risk for Falls Due to: No Fall Risks Fall risk Follow up: Falls evaluation completed; Falls prevention discussed  Home and Transportation Safety: All rugs have non-skid backing?: yes All stairs or  steps have railings?: N/A, no stairs Grab bars in the bathtub or shower?: yes Have non-skid surface in bathtub or shower?: yes Good home lighting?: yes Regular seat belt use?: yes Hospital stays in the last year:: no  Cognitive Assessment Difficulty concentrating, remembering, or making decisions? : no Will 6CIT or Mini Cog be Completed: no 6CIT or Mini Cog Declined: patient alert, oriented, able to answer questions appropriately and recall recent events  Advance Directives (For Healthcare) Does Patient Have a Medical Advance Directive?: No Would patient like information on creating a medical advance directive?: No - Patient declined  Reviewed/Updated  Reviewed/Updated: Reviewed All (Medical, Surgical, Family, Medications, Allergies, Care Teams, Patient Goals)  Objective:    Today's Vitals   05/21/24 1258  Weight: 203 lb (92.1 kg)  Height: 5' 4 (1.626 m)   Body mass index is 34.84 kg/m.  Current Medications (verified) Outpatient Encounter Medications as of 05/21/2024  Medication Sig   amphetamine -dextroamphetamine  (ADDERALL) 30 MG tablet Take 1 tablet by mouth 2 (two) times daily.   amphetamine -dextroamphetamine  (ADDERALL) 30 MG tablet Take 1 tablet by mouth 2 (two) times daily.   atorvastatin  (LIPITOR) 10 MG tablet Take 1 tablet (10 mg total) by mouth daily.   CALCIUM  CITRATE PO Take 3 tablets by mouth 2 (two) times daily.   citalopram  (CELEXA ) 20 MG tablet Take 1 tablet (20 mg total) by mouth daily.   cyclobenzaprine  (FLEXERIL ) 10 MG tablet Take 1 tablet (10 mg total) by mouth 2 (two) times daily. (Patient taking differently: Take 10 mg by mouth 3 (three) times daily as needed for muscle spasms.)   insulin  lispro (HUMALOG  KWIKPEN) 200 UNIT/ML KwikPen Inject 10 Units into the skin 2 (two) times daily with breakfast and supper for glucose over 200. (Patient taking differently: Inject 10 Units into the skin 2 (two) times daily as needed.)   levothyroxine  (SYNTHROID ) 50  MCG tablet Take 1 tablet (50 mcg total) by mouth daily before breakfast.   losartan  (COZAAR ) 50 MG tablet Take 1 tablet (50 mg total) by mouth daily.   MAGNESIUM  PO Take 700 mg by mouth daily.   Multiple Vitamins-Minerals (BARIATRIC MULTIVITAMINS PO) Take 1 tablet by mouth daily.   nortriptyline  (PAMELOR ) 50 MG capsule Take 1 capsule (50 mg total) by mouth 2 (two) times daily.   nortriptyline  (PAMELOR ) 50 MG capsule Take 1 capsule (50 mg total) by mouth 2 (two) times daily.   OREGANO PO Take 6,000 mg by mouth daily.   oxyCODONE  (ROXICODONE ) 5 MG immediate release tablet Take 1 tablet (5 mg total) by mouth every 6 (six) hours as needed for severe pain (pain score 7-10) or moderate pain (pain score 4-6).   oxyCODONE -acetaminophen  (PERCOCET) 10-325 MG tablet take 1 tablet by oral route  every 8 hours as needed (DNF 05/14/24)   senna-docusate (SENOKOT-S) 8.6-50 MG tablet Take 1 tablet by mouth daily.   SUMAtriptan  (IMITREX ) 100 MG tablet May repeat in 2 hours if headache persists or recurs.  Do not take more than 2 in 24 hours   vitamin A 3 MG (10000 UNITS) capsule Take 10,000 Units by mouth daily.   Vitamin E 268 MG (400 UNIT) CAPS Take 1 capsule by mouth daily.   VITAMIN K PO Take 550 mcg by mouth daily.   [DISCONTINUED] pantoprazole  (PROTONIX ) 40 MG tablet TAKE 1 TABLET(40 MG) BY MOUTH TWICE DAILY   No facility-administered encounter medications on file as of 05/21/2024.   Hearing/Vision screen Hearing Screening - Comments:: Denies hearing difficulties   Vision Screening - Comments:: Wears eyeglasses/[facility]/UTD Immunizations and Health Maintenance Health Maintenance  Topic Date Due   FOOT EXAM  Never done   Pneumococcal Vaccine: 50+ Years (1 of 2 - PCV) Never done   Cervical Cancer Screening (HPV/Pap Cotest)  12/05/2015   Zoster Vaccines- Shingrix (2 of 2) 05/11/2021   Medicare Annual Wellness (AWV)  07/01/2023   Influenza Vaccine  01/26/2024   COVID-19 Vaccine (6  - 2025-26 season) 02/26/2024   OPHTHALMOLOGY EXAM  08/06/2024   Diabetic kidney evaluation - Urine ACR  08/29/2024   HEMOGLOBIN A1C  10/09/2024   Diabetic kidney evaluation - eGFR measurement  04/10/2025   Mammogram  04/30/2026  Colonoscopy  05/06/2026   DTaP/Tdap/Td (3 - Td or Tdap) 03/21/2029   Hepatitis C Screening  Completed   HIV Screening  Completed   Hepatitis B Vaccines 19-59 Average Risk  Aged Out   HPV VACCINES  Aged Out   Meningococcal B Vaccine  Aged Out        Assessment/Plan:  This is a routine wellness examination for Danikah.  Patient Care Team: Micheal Wolm ORN, MD as PCP - General (Family Medicine)  I have personally reviewed and noted the following in the patient's chart:   Medical and social history Use of alcohol, tobacco or illicit drugs  Current medications and supplements including opioid prescriptions. Functional ability and status Nutritional status Physical activity Advanced directives List of other physicians Hospitalizations, surgeries, and ER visits in previous 12 months Vitals Screenings to include cognitive, depression, and falls Referrals and appointments  No orders of the defined types were placed in this encounter.  In addition, I have reviewed and discussed with patient certain preventive protocols, quality metrics, and best practice recommendations. A written personalized care plan for preventive services as well as general preventive health recommendations were provided to patient.   Izzie Geers L Laurielle Selmon, CMA   05/21/2024   No follow-ups on file.  After Visit Summary: (MyChart) Due to this being a telephonic visit, the after visit summary with patients personalized plan was offered to patient via MyChart   Nurse Notes: Patient is due for a flu vaccine, a pneumonia vaccine, and a 2nd Shingles vaccine.  She is also due for a pap smear.  Patient stated that she has had a foot exam on 04/15/2024 with Dr. Valarie.  She had no other concerns  to address today.

## 2024-05-27 ENCOUNTER — Other Ambulatory Visit: Payer: Self-pay

## 2024-05-27 ENCOUNTER — Other Ambulatory Visit (HOSPITAL_BASED_OUTPATIENT_CLINIC_OR_DEPARTMENT_OTHER): Payer: Self-pay

## 2024-05-27 NOTE — Progress Notes (Signed)
 Chief Complaint  Patient presents with   Medicare Wellness     Subjective:   [name] Sherman is a 63 y.o. female who presents for a Medicare Annual Wellness Visit.  Allergies (verified) Keflex  [cephalexin ]   History: Past Medical History:  Diagnosis Date   ADD (attention deficit disorder)    Anemia    Anxiety    Arthritis    shoulder, right (arthritis, tendonitis, bursitis)   Cancer (HCC)    cervicsl 1982   Chronic low back pain    Complication of anesthesia    Dyspnea    Fatty liver    GERD (gastroesophageal reflux disease)    History of cervical cancer    s/p  laser ablation of cervix 1980's   History of COVID-19 07/2019   History of kidney stones    Hyperlipidemia    Hypertension    Hypothyroidism    IBS (irritable bowel syndrome)    MDD (major depressive disorder)    Migraine    OSA (obstructive sleep apnea)    pt used cpap up until 2013 states lost wt and did not need anymore   PONV (postoperative nausea and vomiting)    Restless legs    Sleep apnea    wears CPAP   Type 2 diabetes mellitus (HCC)    Urgency of urination    Vertigo    Vitamin D  deficiency    Vulvar cyst    Past Surgical History:  Procedure Laterality Date   BUNIONECTOMY Right 2004   COLONOSCOPY     CYSTOSCOPY W/ RETROGRADES Bilateral 05/26/2015   Procedure: CYSTOSCOPY WITH RETROGRADE PYELOGRAM;  Surgeon: Donnice Brooks, MD;  Location: [facility];  Service: Urology;  Laterality: Bilateral;   CYSTOSCOPY W/ URETERAL STENT REMOVAL Left 05/26/2015   Procedure: CYSTOSCOPY WITH STENT REMOVAL;  Surgeon: Donnice Brooks, MD;  Location: [facility];  Service: Urology;  Laterality: Left;   CYSTOSCOPY WITH RETROGRADE PYELOGRAM, URETEROSCOPY AND STENT PLACEMENT Left 05/01/2015   Procedure: CYSTOSCOPY WITH RETROGRADE PYELOGRAM AND STENT PLACEMENT;  Surgeon: Donnice Brooks, MD;  Location: [facility];  Service: Urology;  Laterality: Left;   CYSTOSCOPY WITH STENT  PLACEMENT Bilateral 05/26/2015   Procedure: CYSTOSCOPY WITH STENT PLACEMENT;  Surgeon: Donnice Brooks, MD;  Location: [facility];  Service: Urology;  Laterality: Bilateral;   CYSTOSCOPY WITH URETEROSCOPY Bilateral 05/26/2015   Procedure: CYSTOSCOPY WITH URETEROSCOPY;  Surgeon: Donnice Brooks, MD;  Location: [facility];  Service: Urology;  Laterality: Bilateral;   CYSTOSCOPY/URETEROSCOPY/HOLMIUM LASER/STENT PLACEMENT Right 06/09/2015   Procedure: CYSTOSCOPY/URETEROSCOPY/STENT PLACEMENT REMOVAL LEFT URETERAL STENT;  Surgeon: Donnice Brooks, MD;  Location: [facility];  Service: Urology;  Laterality: Right;   DIAGNOSTIC LAPAROSCOPY     HOLMIUM LASER APPLICATION Left 05/26/2015   Procedure: HOLMIUM LASER APPLICATION;  Surgeon: Donnice Brooks, MD;  Location[facility];  Service: Urology;  Laterality: Left;   HOLMIUM LASER APPLICATION Right 06/09/2015   Procedure: HOLMIUM LASER APPLICATION;  Surgeon: Donnice Brooks, MD;  Location[facility];  Service: Urology;  Laterality: Right;   INSERTION OF MESH N/A 09/05/2016   Procedure: INSERTION OF MESH;  Surgeon: Mitzie DELENA Freund, MD;  [facility];  Service: General;  Laterality: N/A;   LAPAROSCOPIC ASSISTED VAGINAL HYSTERECTOMY  04/23/2002   @[facility]   by Dr Horacio;  With Left Salpingoophorectomy/  Anterior Repair/  Tension Free Tape Sling placement   LAPAROSCOPIC GASTRIC SLEEVE RESECTION N/A 09/07/2021   Procedure: LAPAROSCOPIC GASTRIC SLEEVE RESECTION;  Surgeon: Freund Mitzie  A, MD;  Location: [facility];  Service: General;  Laterality: N/A;   LASER ABLATION OF THE CERVIX  x2  1980's   POSTERIOR LAMINECTOMY / DECOMPRESSION LUMBAR SPINE  02/03/2020   @[facility]   Dr Colon;   L4--5 and fusion   POSTERIOR LUMBAR FUSION  01/21/2019   @[facility]  by Dr Colon;   L5--S1   SHOULDER ARTHROSCOPY WITH ROTATOR CUFF REPAIR Right 05/09/2024   Procedure: ARTHROSCOPY, SHOULDER, WITH ROTATOR CUFF REPAIR;  Surgeon: Teresa Rankin ORN, MD;   Location: [facility];  Service: Orthopedics;  Laterality: Right;  right shoulder arthroscopy rotator cuff repair, bicep tenodesis, subacromial decompression, extensive debridement   TUBAL LIGATION  1980's   UPPER GI ENDOSCOPY N/A 09/07/2021   Procedure: UPPER GI ENDOSCOPY;  Surgeon: Signe Mitzie LABOR, MD;  Location: [facility];  Service: General;  Laterality: N/A;   VENTRAL HERNIA REPAIR N/A 09/05/2016   Procedure: LAPAROSCOPIC VENTRAL HERNIA;  Surgeon: Mitzie LABOR Signe, MD;  [facility];  Service: General;  Laterality: N/A;   Family History  Problem Relation Age of Onset   Alcohol abuse Mother    Lung cancer Mother    Colon cancer Mother    Arthritis Father    Hyperlipidemia Father    Heart disease Father    Hypertension Father    COPD Father    Kidney disease Paternal Uncle    Cancer Maternal Grandmother        colon   Colon cancer Maternal Grandmother    Rectal cancer Maternal Grandmother    Cancer Paternal Grandmother        breast   Kidney cancer Sister        lung cancer   Cirrhosis Sister    Esophageal cancer Neg Hx    Stomach cancer Neg Hx    Social History   Occupational History   Occupation: Veterinary Surgeon  Tobacco Use   Smoking status: Never   Smokeless tobacco: Never  Vaping Use   Vaping status: Never Used  Substance and Sexual Activity   Alcohol use: Not Currently    Comment: socially   Drug use: Never   Sexual activity: Yes   Tobacco Counseling Counseling given: Not Answered  SDOH Screenings   Food Insecurity: No Food Insecurity (05/21/2024)  Housing: Unknown (05/21/2024)  Transportation Needs: No Transportation Needs (05/21/2024)  Utilities: Not At Risk (05/21/2024)  Alcohol Screen: Low Risk  (06/30/2022)  Depression (PHQ2-9): Medium Risk (05/21/2024)  Financial Resource Strain: Low Risk  (06/08/2021)  Physical Activity: Inactive (05/21/2024)  Social Connections: Moderately Isolated (05/21/2024)  Stress: No Stress Concern Present (05/21/2024)  Tobacco  Use: Low Risk  (05/21/2024)  Health Literacy: Adequate Health Literacy (05/21/2024)   See flowsheets for full screening details  Depression Screen PHQ 2 & 9 Depression Scale- Over the past 2 weeks, how often have you been bothered by any of the following problems? Little interest or pleasure in doing things: 0 Feeling down, depressed, or hopeless (PHQ Adolescent also includes...irritable): 1 (due to father death) PHQ-2 Total Score: 1 Trouble falling or staying asleep, or sleeping too much: 1 (due to shoulder surgery) Feeling tired or having little energy: 1 Poor appetite or overeating (PHQ Adolescent also includes...weight loss): 3 (poor appetite) Feeling bad about yourself - or that you are a failure or have let yourself or your family down: 0 Trouble concentrating on things, such as reading the newspaper or watching television (PHQ Adolescent also includes...like school work): 0 Moving or speaking so slowly that other people could have noticed.  Or the opposite - being so fidgety or restless that you have been moving around a lot more than usual: 0 Thoughts that you would be better off dead, or of hurting yourself in some way: 0 PHQ-9 Total Score: 6 If you checked off any problems, how difficult have these problems made it for you to do your work, take care of things at home, or get along with other people?: Not difficult at all  Depression Treatment Depression Interventions/Treatment : EYV7-0 Score <4 Follow-up Not Indicated     Goals Addressed               This Visit's Progress     Patient stated (pt-stated)        I want to get stronger in my legs and arms./2025       Visit info / Clinical Intake: Medicare Wellness Visit Type:: Subsequent Annual Wellness Visit Persons participating in visit:: patient Medicare Wellness Visit Mode:: Video Because this visit was a virtual/telehealth visit:: unable to obtan vitals due to lack of equipment If Telephone or Video please  confirm:: I connected with the patient using audio enabled telemedicine application and verified that I am speaking with the correct person using two identifiers; I discussed the limitations of evaluation and management by telemedicine; The patient expressed understanding and agreed to proceed Patient Location:: Home Provider Location:: Home Information given by:: patient Interpreter Needed?: No Pre-visit prep was completed: no AWV questionnaire completed by patient prior to visit?: no Living arrangements:: lives with spouse/significant other Patient's Overall Health Status Rating: very good Typical amount of pain: some Does pain affect daily life?: (!) yes Are you currently prescribed opioids?: (!) yes  Dietary Habits and Nutritional Risks How many meals a day?: (!) 1 Eats fruit and vegetables daily?: (!) no Most meals are obtained by: eating out In the last 2 weeks, have you had any of the following?: none (diarrhea) Diabetic:: (!) yes Any non-healing wounds?: no How often do you check your BS?: as needed Would you like to be referred to a Nutritionist or for Diabetic Management? : no  Functional Status Activities of Daily Living (to include ambulation/medication): Independent Ambulation: Independent with device- listed below Home Assistive Devices/Equipment: Eyeglasses Medication Administration: Independent Home Management: Independent Manage your own finances?: yes Primary transportation is: driving Concerns about vision?: no *vision screening is required for [facility]* Concerns about hearing?: no  Fall Screening Falls in the past year?: 0 Number of falls in past year: 0 Was there an injury with Fall?: 0 Fall Risk Category Calculator: 0 Patient Fall Risk Level: Low Fall Risk  Fall Risk Patient at Risk for Falls Due to: No Fall Risks Fall risk Follow up: Falls evaluation completed; Falls prevention discussed  Home and Transportation Safety: All rugs have non-skid backing?:  yes All stairs or steps have railings?: N/A, no stairs Grab bars in the bathtub or shower?: yes Have non-skid surface in bathtub or shower?: yes Good home lighting?: yes Regular seat belt use?: yes Hospital stays in the last year:: no  Cognitive Assessment Difficulty concentrating, remembering, or making decisions? : no Will 6CIT or Mini Cog be Completed: no 6CIT or Mini Cog Declined: patient alert, oriented, able to answer questions appropriately and recall recent events  Advance Directives (For Healthcare) Does Patient Have a Medical Advance Directive?: No Would patient like information on creating a medical advance directive?: No - Patient declined  Reviewed/Updated  Reviewed/Updated: Reviewed All (Medical, Surgical, Family, Medications, Allergies, Care Teams, Patient Goals)  Objective:    Today's Vitals   05/21/24 1258  Weight: 203 lb (92.1 kg)  Height: 5' 4 (1.626 m)   Body mass index is 34.84 kg/m.  Current Medications (verified) Outpatient Encounter Medications as of 05/21/2024  Medication Sig   amphetamine -dextroamphetamine  (ADDERALL) 30 MG tablet Take 1 tablet by mouth 2 (two) times daily.   amphetamine -dextroamphetamine  (ADDERALL) 30 MG tablet Take 1 tablet by mouth 2 (two) times daily.   atorvastatin  (LIPITOR) 10 MG tablet Take 1 tablet (10 mg total) by mouth daily.   CALCIUM  CITRATE PO Take 3 tablets by mouth 2 (two) times daily.   citalopram  (CELEXA ) 20 MG tablet Take 1 tablet (20 mg total) by mouth daily.   cyclobenzaprine  (FLEXERIL ) 10 MG tablet Take 1 tablet (10 mg total) by mouth 2 (two) times daily. (Patient taking differently: Take 10 mg by mouth 3 (three) times daily as needed for muscle spasms.)   insulin  lispro (HUMALOG  KWIKPEN) 200 UNIT/ML KwikPen Inject 10 Units into the skin 2 (two) times daily with breakfast and supper for glucose over 200. (Patient taking differently: Inject 10 Units into the skin 2 (two) times daily as needed.)    levothyroxine  (SYNTHROID ) 50 MCG tablet Take 1 tablet (50 mcg total) by mouth daily before breakfast.   losartan  (COZAAR ) 50 MG tablet Take 1 tablet (50 mg total) by mouth daily.   MAGNESIUM  PO Take 700 mg by mouth daily.   Multiple Vitamins-Minerals (BARIATRIC MULTIVITAMINS PO) Take 1 tablet by mouth daily.   nortriptyline  (PAMELOR ) 50 MG capsule Take 1 capsule (50 mg total) by mouth 2 (two) times daily.   nortriptyline  (PAMELOR ) 50 MG capsule Take 1 capsule (50 mg total) by mouth 2 (two) times daily.   OREGANO PO Take 6,000 mg by mouth daily.   oxyCODONE  (ROXICODONE ) 5 MG immediate release tablet Take 1 tablet (5 mg total) by mouth every 6 (six) hours as needed for severe pain (pain score 7-10) or moderate pain (pain score 4-6).   oxyCODONE -acetaminophen  (PERCOCET) 10-325 MG tablet take 1 tablet by oral route  every 8 hours as needed (DNF 05/14/24)   senna-docusate (SENOKOT-S) 8.6-50 MG tablet Take 1 tablet by mouth daily.   SUMAtriptan  (IMITREX ) 100 MG tablet May repeat in 2 hours if headache persists or recurs.  Do not take more than 2 in 24 hours   vitamin A 3 MG (10000 UNITS) capsule Take 10,000 Units by mouth daily.   Vitamin E 268 MG (400 UNIT) CAPS Take 1 capsule by mouth daily.   VITAMIN K PO Take 550 mcg by mouth daily.   [DISCONTINUED] pantoprazole  (PROTONIX ) 40 MG tablet TAKE 1 TABLET(40 MG) BY MOUTH TWICE DAILY   No facility-administered encounter medications on file as of 05/21/2024.   Hearing/Vision screen Hearing Screening - Comments:: Denies hearing difficulties   Vision Screening - Comments:: Wears eyeglasses/[facility]/UTD Immunizations and Health Maintenance Health Maintenance  Topic Date Due   FOOT EXAM  Never done   Pneumococcal Vaccine: 50+ Years (1 of 2 - PCV) Never done   Cervical Cancer Screening (HPV/Pap Cotest)  12/05/2015   Zoster Vaccines- Shingrix (2 of 2) 05/11/2021   Influenza Vaccine  01/26/2024   COVID-19 Vaccine (6 - 2025-26 season)  02/26/2024   OPHTHALMOLOGY EXAM  08/06/2024   Diabetic kidney evaluation - Urine ACR  08/29/2024   HEMOGLOBIN A1C  10/09/2024   Diabetic kidney evaluation - eGFR measurement  04/10/2025   Medicare Annual Wellness (AWV)  05/21/2025   Mammogram  04/30/2026  Colonoscopy  05/06/2026   DTaP/Tdap/Td (3 - Td or Tdap) 03/21/2029   Hepatitis C Screening  Completed   HIV Screening  Completed   Hepatitis B Vaccines 19-59 Average Risk  Aged Out   HPV VACCINES  Aged Out   Meningococcal B Vaccine  Aged Out        Assessment/Plan:  This is a routine wellness examination for [name] Sherman.  Patient Care Team: Micheal Wolm ORN, MD as PCP - General (Family Medicine)  I have personally reviewed and noted the following in the patient's chart:   Medical and social history Use of alcohol, tobacco or illicit drugs  Current medications and supplements including opioid prescriptions. Functional ability and status Nutritional status Physical activity Advanced directives List of other physicians Hospitalizations, surgeries, and ER visits in previous 12 months Vitals Screenings to include cognitive, depression, and falls Referrals and appointments  No orders of the defined types were placed in this encounter.  In addition, I have reviewed and discussed with patient certain preventive protocols, quality metrics, and best practice recommendations. A written personalized care plan for preventive services as well as general preventive health recommendations were provided to patient.   [name] Sherman, CMA   05/21/2024  Return in 1 year (on 05/21/2025).  After Visit Summary: (MyChart) Due to this being a telephonic visit, the after visit summary with patients personalized plan was offered to patient via MyChart   Nurse Notes: Patient is due for a flu vaccine, a pneumonia vaccine, and a 2nd Shingles vaccine.  She is also due for a pap smear.  Patient stated that she has had a foot exam on 04/15/2024 with  Dr. Valarie.  She had no other concerns to address today.

## 2024-05-28 ENCOUNTER — Other Ambulatory Visit: Payer: Self-pay

## 2024-05-28 ENCOUNTER — Other Ambulatory Visit (HOSPITAL_BASED_OUTPATIENT_CLINIC_OR_DEPARTMENT_OTHER): Payer: Self-pay

## 2024-05-29 ENCOUNTER — Other Ambulatory Visit: Payer: Self-pay

## 2024-06-01 ENCOUNTER — Other Ambulatory Visit (HOSPITAL_BASED_OUTPATIENT_CLINIC_OR_DEPARTMENT_OTHER): Payer: Self-pay

## 2024-06-05 ENCOUNTER — Other Ambulatory Visit (HOSPITAL_BASED_OUTPATIENT_CLINIC_OR_DEPARTMENT_OTHER): Payer: Self-pay

## 2024-06-10 ENCOUNTER — Other Ambulatory Visit (HOSPITAL_BASED_OUTPATIENT_CLINIC_OR_DEPARTMENT_OTHER): Payer: Self-pay

## 2024-06-11 ENCOUNTER — Other Ambulatory Visit (HOSPITAL_BASED_OUTPATIENT_CLINIC_OR_DEPARTMENT_OTHER): Payer: Self-pay

## 2024-06-11 MED ORDER — OXYCODONE-ACETAMINOPHEN 10-325 MG PO TABS
1.0000 | ORAL_TABLET | Freq: Three times a day (TID) | ORAL | 0 refills | Status: AC | PRN
  Filled 2024-06-26: qty 90, 30d supply, fill #0

## 2024-06-11 MED ORDER — OXYCODONE-ACETAMINOPHEN 10-325 MG PO TABS: 1.0000 | ORAL_TABLET | Freq: Three times a day (TID) | ORAL | 0 refills | Status: AC | PRN

## 2024-06-11 MED ORDER — OXYCODONE-ACETAMINOPHEN 10-325 MG PO TABS
1.0000 | ORAL_TABLET | Freq: Three times a day (TID) | ORAL | 0 refills | Status: AC | PRN
  Filled 2024-07-29: qty 90, 30d supply, fill #0

## 2024-06-26 ENCOUNTER — Other Ambulatory Visit (HOSPITAL_BASED_OUTPATIENT_CLINIC_OR_DEPARTMENT_OTHER): Payer: Self-pay

## 2024-06-28 ENCOUNTER — Other Ambulatory Visit (HOSPITAL_BASED_OUTPATIENT_CLINIC_OR_DEPARTMENT_OTHER): Payer: Self-pay

## 2024-07-02 ENCOUNTER — Encounter: Payer: Self-pay | Admitting: Family Medicine

## 2024-07-02 DIAGNOSIS — R635 Abnormal weight gain: Secondary | ICD-10-CM

## 2024-07-08 ENCOUNTER — Ambulatory Visit: Admitting: Family Medicine

## 2024-07-10 ENCOUNTER — Other Ambulatory Visit: Payer: Self-pay | Admitting: Family Medicine

## 2024-07-10 ENCOUNTER — Other Ambulatory Visit (HOSPITAL_BASED_OUTPATIENT_CLINIC_OR_DEPARTMENT_OTHER): Payer: Self-pay

## 2024-07-11 ENCOUNTER — Other Ambulatory Visit: Payer: Self-pay

## 2024-07-11 ENCOUNTER — Other Ambulatory Visit (HOSPITAL_BASED_OUTPATIENT_CLINIC_OR_DEPARTMENT_OTHER): Payer: Self-pay

## 2024-07-11 ENCOUNTER — Other Ambulatory Visit: Payer: Self-pay | Admitting: Family Medicine

## 2024-07-11 MED ORDER — AMPHETAMINE-DEXTROAMPHETAMINE 30 MG PO TABS
30.0000 mg | ORAL_TABLET | Freq: Two times a day (BID) | ORAL | 0 refills | Status: AC
  Filled 2024-07-11: qty 60, 30d supply, fill #0

## 2024-07-11 MED ORDER — HUMALOG KWIKPEN 200 UNIT/ML ~~LOC~~ SOPN
10.0000 [IU] | PEN_INJECTOR | Freq: Two times a day (BID) | SUBCUTANEOUS | 1 refills | Status: AC
  Filled 2024-07-11: qty 3, 30d supply, fill #0

## 2024-07-12 ENCOUNTER — Other Ambulatory Visit (HOSPITAL_BASED_OUTPATIENT_CLINIC_OR_DEPARTMENT_OTHER): Payer: Self-pay

## 2024-07-15 ENCOUNTER — Encounter: Payer: Self-pay | Admitting: Family Medicine

## 2024-07-15 ENCOUNTER — Ambulatory Visit: Admitting: Family Medicine

## 2024-07-15 ENCOUNTER — Other Ambulatory Visit: Payer: Self-pay

## 2024-07-15 VITALS — BP 122/68 | HR 123 | Temp 98.3°F | Wt 218.3 lb

## 2024-07-15 DIAGNOSIS — E039 Hypothyroidism, unspecified: Secondary | ICD-10-CM | POA: Diagnosis not present

## 2024-07-15 DIAGNOSIS — Z23 Encounter for immunization: Secondary | ICD-10-CM | POA: Diagnosis not present

## 2024-07-15 DIAGNOSIS — E785 Hyperlipidemia, unspecified: Secondary | ICD-10-CM | POA: Diagnosis not present

## 2024-07-15 DIAGNOSIS — E119 Type 2 diabetes mellitus without complications: Secondary | ICD-10-CM

## 2024-07-15 DIAGNOSIS — I10 Essential (primary) hypertension: Secondary | ICD-10-CM

## 2024-07-15 DIAGNOSIS — E1165 Type 2 diabetes mellitus with hyperglycemia: Secondary | ICD-10-CM

## 2024-07-15 LAB — POCT GLYCOSYLATED HEMOGLOBIN (HGB A1C): Hemoglobin A1C: 6.1 % — AB (ref 4.0–5.6)

## 2024-07-15 NOTE — Patient Instructions (Signed)
 A1C today 6.1 which is excellent.

## 2024-07-15 NOTE — Progress Notes (Signed)
 "  Established Patient Office Visit  Subjective   Patient ID: [name] Sherman, female    DOB: 04-01-1961  Age: 64 y.o. MRN: [id number]  Chief Complaint  Patient presents with   Medical Management of Chronic Issues    HPI   [name] is seen for medical follow-up.  She is recovering from recent right shoulder surgery.  Generally doing well.  Her medical problems include history of hypertension, migraine headaches, obstructive sleep apnea, fatty liver, hypothyroidism, type 2 diabetes, ADD, hyperlipidemia.  She had coronary calcium  score of 7.86 in May 2023.  Recently started back on atorvastatin .  She had gastric bypass surgery back 2023 and blood sugars improved dramatically following that.  They have been consistently in the 6 range since that time.  At 1 point she was taking GLP-1 medication.  Currently on no diabetes medications.  She is on thyroid  replacement and had normal TSH 3 months ago.  She has not had flu vaccine this season and would still like to get 1 today.  Also no history of pneumonia vaccine.  She would like to get both vaccines.  No recent chest pains.  Getting ready to start physical therapy for recent right shoulder surgery.  Blood pressure treated with losartan  50 mg daily.  Denies any consistent orthostatic type symptoms.  Past Medical History:  Diagnosis Date   ADD (attention deficit disorder)    Anemia    Anxiety    Arthritis    shoulder, right (arthritis, tendonitis, bursitis)   Cancer (HCC)    cervicsl 1982   Chronic low back pain    Complication of anesthesia    Dyspnea    Fatty liver    GERD (gastroesophageal reflux disease)    History of cervical cancer    s/p  laser ablation of cervix 1980's   History of COVID-19 07/2019   History of kidney stones    Hyperlipidemia    Hypertension    Hypothyroidism    IBS (irritable bowel syndrome)    MDD (major depressive disorder)    Migraine    OSA (obstructive sleep apnea)    pt used cpap up until 2013 states  lost wt and did not need anymore   PONV (postoperative nausea and vomiting)    Restless legs    Sleep apnea    wears CPAP   Type 2 diabetes mellitus (HCC)    Urgency of urination    Vertigo    Vitamin D  deficiency    Vulvar cyst    Past Surgical History:  Procedure Laterality Date   BUNIONECTOMY Right 2004   COLONOSCOPY     CYSTOSCOPY W/ RETROGRADES Bilateral 05/26/2015   Procedure: CYSTOSCOPY WITH RETROGRADE PYELOGRAM;  Surgeon: Donnice Brooks, MD;  Location: [facility];  Service: Urology;  Laterality: Bilateral;   CYSTOSCOPY W/ URETERAL STENT REMOVAL Left 05/26/2015   Procedure: CYSTOSCOPY WITH STENT REMOVAL;  Surgeon: Donnice Brooks, MD;  [facility];  Service: Urology;  Laterality: Left;   CYSTOSCOPY WITH RETROGRADE PYELOGRAM, URETEROSCOPY AND STENT PLACEMENT Left 05/01/2015   Procedure: CYSTOSCOPY WITH RETROGRADE PYELOGRAM AND STENT PLACEMENT;  Surgeon: Donnice Brooks, MD;  Location[facility];  Service: Urology;  Laterality: Left;   CYSTOSCOPY WITH STENT PLACEMENT Bilateral 05/26/2015   Procedure: CYSTOSCOPY WITH STENT PLACEMENT;  Surgeon: Donnice Brooks, MD;  Location: [facility];  Service: Urology;  Laterality: Bilateral;   CYSTOSCOPY WITH URETEROSCOPY Bilateral 05/26/2015   Procedure: CYSTOSCOPY WITH URETEROSCOPY;  Surgeon: Donnice Brooks, MD;  Location: [facility];  Service: Urology;  Laterality: Bilateral;   CYSTOSCOPY/URETEROSCOPY/HOLMIUM LASER/STENT PLACEMENT Right 06/09/2015   Procedure: CYSTOSCOPY/URETEROSCOPY/STENT PLACEMENT REMOVAL LEFT URETERAL STENT;  Surgeon: Donnice Brooks, MD;  Location: [facility];  Service: Urology;  Laterality: Right;   DIAGNOSTIC LAPAROSCOPY     HOLMIUM LASER APPLICATION Left 05/26/2015   Procedure: HOLMIUM LASER APPLICATION;  Surgeon: Donnice Brooks, MD;  Location: [facility];  Service: Urology;  Laterality: Left;   HOLMIUM LASER APPLICATION Right  06/09/2015   Procedure: HOLMIUM LASER APPLICATION;  Surgeon: Donnice Brooks, MD;  Location[facility];  Service: Urology;  Laterality: Right;   INSERTION OF MESH N/A 09/05/2016   Procedure: INSERTION OF MESH;  Surgeon: Mitzie DELENA Freund, MD;  Location: [facility];  Service: General;  Laterality: N/A;   LAPAROSCOPIC ASSISTED VAGINAL HYSTERECTOMY  04/23/2002   @[facility]   by Dr Horacio;  With Left Salpingoophorectomy/  Anterior Repair/  Tension Free Tape Sling placement   LAPAROSCOPIC GASTRIC SLEEVE RESECTION N/A 09/07/2021   Procedure: LAPAROSCOPIC GASTRIC SLEEVE RESECTION;  Surgeon: Freund Mitzie DELENA, MD;  Location: [facility];  Service: General;  Laterality: N/A;   LASER ABLATION OF THE CERVIX  x2  1980's   POSTERIOR LAMINECTOMY / DECOMPRESSION LUMBAR SPINE  02/03/2020   @[facility]   Dr Colon;   L4--5 and fusion   POSTERIOR LUMBAR FUSION  01/21/2019   @[facility]  by Dr Colon;   L5--S1   SHOULDER ARTHROSCOPY WITH ROTATOR CUFF REPAIR Right 05/09/2024   Procedure: ARTHROSCOPY, SHOULDER, WITH ROTATOR CUFF REPAIR;  Surgeon: Teresa Rankin ORN, MD;  Location: [facility];  Service: Orthopedics;  Laterality: Right;  right shoulder arthroscopy rotator cuff repair, bicep tenodesis, subacromial decompression, extensive debridement   TUBAL LIGATION  1980's   UPPER GI ENDOSCOPY N/A 09/07/2021   Procedure: UPPER GI ENDOSCOPY;  Surgeon: Freund Mitzie DELENA, MD;  Location: [facility];  Service: General;  Laterality: N/A;   VENTRAL HERNIA REPAIR N/A 09/05/2016   Procedure: LAPAROSCOPIC VENTRAL HERNIA;  Surgeon: Mitzie DELENA Freund, MD;  Location: [facility];  Service: General;  Laterality: N/A;    reports that she has never smoked. She has never used smokeless tobacco. She reports that she does not currently use alcohol. She reports that she does not use drugs. family history includes Alcohol abuse in her mother; Arthritis in her father; COPD in her father; Cancer in her maternal grandmother and paternal grandmother; Cirrhosis in her sister; Colon cancer in her  maternal grandmother and mother; Heart disease in her father; Hyperlipidemia in her father; Hypertension in her father; Kidney cancer in her sister; Kidney disease in her paternal uncle; Lung cancer in her mother; Rectal cancer in her maternal grandmother. Allergies[1]  Review of Systems  Constitutional:  Negative for chills, fever and malaise/fatigue.  Eyes:  Negative for blurred vision.  Respiratory:  Negative for shortness of breath.   Cardiovascular:  Negative for chest pain.  Neurological:  Positive for dizziness. Negative for weakness and headaches.      Objective:     BP 122/68 (BP Location: Left Arm, Cuff Size: Large)   Pulse (!) 123   Temp 98.3 F (36.8 C) (Oral)   Wt 218 lb 4.8 oz (99 kg)   SpO2 98%   BMI 37.47 kg/m  BP Readings from Last 3 Encounters:  07/15/24 122/68  05/09/24 114/64  05/03/24 (!) 146/79   Wt Readings from Last 3 Encounters:  07/15/24 218 lb 4.8 oz (99 kg)  05/21/24 203 lb (92.1 kg)  05/03/24 203 lb (  92.1 kg)      Physical Exam Vitals reviewed.  Constitutional:      General: She is not in acute distress.    Appearance: She is not ill-appearing.  Cardiovascular:     Rate and Rhythm: Normal rate and regular rhythm.     Comments: Initial pulse was recorded 123 but repeat after rest was 92 and regular Pulmonary:     Effort: Pulmonary effort is normal.     Breath sounds: Normal breath sounds. No wheezing or rales.  Neurological:     Mental Status: She is alert.      Results for orders placed or performed in visit on 07/15/24  POC HgB A1c  Result Value Ref Range   Hemoglobin A1C 6.1 (A) 4.0 - 5.6 %   HbA1c POC (<> result, manual entry)     HbA1c, POC (prediabetic range)     HbA1c, POC (controlled diabetic range)        The 10-year ASCVD risk score (Arnett DK, et al., 2019) is: 9.1%    Assessment & Plan:   #1 type 2 diabetes controlled with A1c today 6.1%.  Continue to monitor.  Hopefully, she can get back to more consistent  exercise after her right shoulder further recovers continue annual diabetic eye exam  #2 hypertension.  Well-controlled.  Standing blood pressure today left arm seated large cuff 122/68.  Continue low-dose losartan   #3 hypothyroidism.  On low-dose levothyroxine .  Recent TSH at goal.  #4 health maintenance.  Patient consents to flu vaccine today we also recommended Prevnar 20 and she consents to that as well  #5 hyperlipidemia.  Needs follow-up lipids soon.  She has scheduled follow-up in March we will plan to get at that time.  Previous coronary calcium  score of 7.86 couple years ago.   No follow-ups on file.    Wolm Scarlet, MD     [1]  Allergies Allergen Reactions   Keflex  [Cephalexin ] Swelling and Other (See Comments)    Tongue swelling   "

## 2024-07-21 ENCOUNTER — Other Ambulatory Visit: Payer: Self-pay

## 2024-07-21 ENCOUNTER — Encounter (HOSPITAL_BASED_OUTPATIENT_CLINIC_OR_DEPARTMENT_OTHER): Payer: Self-pay | Admitting: Emergency Medicine

## 2024-07-21 ENCOUNTER — Other Ambulatory Visit (HOSPITAL_BASED_OUTPATIENT_CLINIC_OR_DEPARTMENT_OTHER): Payer: Self-pay

## 2024-07-21 ENCOUNTER — Emergency Department (HOSPITAL_BASED_OUTPATIENT_CLINIC_OR_DEPARTMENT_OTHER)
Admission: EM | Admit: 2024-07-21 | Discharge: 2024-07-21 | Disposition: A | Discharge status: Home / Self Care | Attending: Emergency Medicine | Admitting: Emergency Medicine

## 2024-07-21 DIAGNOSIS — R519 Headache, unspecified: Secondary | ICD-10-CM | POA: Diagnosis not present

## 2024-07-21 DIAGNOSIS — R220 Localized swelling, mass and lump, head: Secondary | ICD-10-CM | POA: Diagnosis present

## 2024-07-21 DIAGNOSIS — E119 Type 2 diabetes mellitus without complications: Secondary | ICD-10-CM | POA: Diagnosis not present

## 2024-07-21 DIAGNOSIS — Z794 Long term (current) use of insulin: Secondary | ICD-10-CM | POA: Diagnosis not present

## 2024-07-21 MED ORDER — DOXYCYCLINE HYCLATE 100 MG PO TABS
300.0000 mg | ORAL_TABLET | Freq: Once | ORAL | Status: AC
  Administered 2024-07-21: 300 mg via ORAL
  Filled 2024-07-21: qty 3

## 2024-07-21 MED ORDER — DOXYCYCLINE HYCLATE 100 MG PO TABS
100.0000 mg | ORAL_TABLET | Freq: Once | ORAL | Status: AC
  Administered 2024-07-21: 100 mg via ORAL
  Filled 2024-07-21: qty 1

## 2024-07-21 MED ORDER — DOXYCYCLINE HYCLATE 100 MG PO CAPS
100.0000 mg | ORAL_CAPSULE | Freq: Two times a day (BID) | ORAL | 0 refills | Status: AC
  Filled 2024-07-21: qty 16, 8d supply, fill #0

## 2024-07-21 NOTE — Discharge Instructions (Signed)
 Was a pleasure taking care of you today.  Your work is reassuring.  I do suspect you may be developing cellulitis, as we discussed.  You did receive your first dose of doxycycline  which is an antibiotic in the emergency department.  Due to the increment weather, I am sending you home with 3 additional doses.  Please take the next dose of doxycycline  tomorrow morning, the following dose tomorrow evening, and the final dose you received from the emergency department on Tuesday morning.  You will need to take this medication for a total of 10 days.  I have sent the remaining 8 days to your pharmacy.  Follow-up with your PCP as indicated if things get worse.  Please return to the emergency department for any worsening symptoms or life-threatening illness.

## 2024-07-21 NOTE — ED Provider Notes (Signed)
 " [place] EMERGENCY DEPARTMENT AT [facility] Provider Note   CSN: [id number] Arrival date & time: 07/21/24  1754     Patient presents with: Facial Swelling   [name] Sherman is a 64 y.o. female past history of diabetes, hyperlipidemia, GERD, who presents emergency department for evaluation of pain and swelling in her nose.  Patient reports that approximately 2 days ago, she noticed redness and tenderness to her left nare.  She denies any eye involvement.  She reports a history of facial cellulitis in the past, and states she feels similarly.  She denies any fever, chills or bodyaches.  HPI     Prior to Admission medications  Medication Sig Start Date End Date Taking? Authorizing Provider  doxycycline  (VIBRAMYCIN ) 100 MG capsule Take 1 capsule (100 mg total) by mouth 2 (two) times daily for 16 doses. 07/23/24 07/31/24 Yes Severus Brodzinski, Marry RAMAN, PA-C  amphetamine -dextroamphetamine  (ADDERALL) 30 MG tablet Take 1 tablet by mouth 2 (two) times daily. 07/11/24   Burchette, Wolm ORN, MD  amphetamine -dextroamphetamine  (ADDERALL) 30 MG tablet Take 1 tablet by mouth 2 (two) times daily. 07/11/24   Burchette, Wolm ORN, MD  atorvastatin  (LIPITOR) 10 MG tablet Take 1 tablet (10 mg total) by mouth daily. 04/05/24   Burchette, Wolm ORN, MD  CALCIUM  CITRATE PO Take 3 tablets by mouth 2 (two) times daily.    [provider]  citalopram  (CELEXA ) 20 MG tablet Take 1 tablet (20 mg total) by mouth daily. 04/05/24   Burchette, Wolm ORN, MD  cyclobenzaprine  (FLEXERIL ) 10 MG tablet Take 1 tablet (10 mg total) by mouth 2 (two) times daily. Patient taking differently: Take 10 mg by mouth 3 (three) times daily as needed for muscle spasms. 09/12/23     insulin  lispro (HUMALOG  KWIKPEN) 200 UNIT/ML KwikPen Inject 10 Units into the skin 2 (two) times daily with breakfast and supper for glucose over 200. 07/11/24   Burchette, Wolm ORN, MD  levothyroxine  (SYNTHROID ) 50 MCG tablet Take 1 tablet (50 mcg total) by  mouth daily before breakfast. 04/05/24   Burchette, Wolm ORN, MD  losartan  (COZAAR ) 50 MG tablet Take 1 tablet (50 mg total) by mouth daily. 04/05/24   Burchette, Wolm ORN, MD  MAGNESIUM  PO Take 700 mg by mouth daily.    [provider]  Multiple Vitamins-Minerals (BARIATRIC MULTIVITAMINS PO) Take 1 tablet by mouth daily.    [provider]  nortriptyline  (PAMELOR ) 50 MG capsule Take 1 capsule (50 mg total) by mouth 2 (two) times daily. 07/07/23     nortriptyline  (PAMELOR ) 50 MG capsule Take 1 capsule (50 mg total) by mouth 2 (two) times daily. 09/12/23     OREGANO PO Take 6,000 mg by mouth daily.    [provider]  oxyCODONE  (ROXICODONE ) 5 MG immediate release tablet Take 1 tablet (5 mg total) by mouth every 6 (six) hours as needed for severe pain (pain score 7-10) or moderate pain (pain score 4-6). 05/09/24   Teresa Rankin ORN, MD  oxyCODONE -acetaminophen  (PERCOCET) 10-325 MG tablet take 1 tablet by oral route  every 8 hours as needed (DNF 05/14/24) 05/15/24     oxyCODONE -acetaminophen  (PERCOCET) 10-325 MG tablet Take 1 tablet by mouth every 8 (eight) hours as needed. 06/11/24     oxyCODONE -acetaminophen  (PERCOCET) 10-325 MG tablet Take 1 tablet by mouth every 8 (eight) hours as needed. 06/11/24     oxyCODONE -acetaminophen  (PERCOCET) 10-325 MG tablet Take 1 tablet by mouth every 8 (eight) hours as needed. 06/11/24  senna-docusate (SENOKOT-S) 8.6-50 MG tablet Take 1 tablet by mouth daily. 05/09/24   Teresa Rankin ORN, MD  SUMAtriptan  (IMITREX ) 100 MG tablet May repeat in 2 hours if headache persists or recurs.  Do not take more than 2 in 24 hours 07/29/22   Burchette, Wolm ORN, MD  vitamin A 3 MG (10000 UNITS) capsule Take 10,000 Units by mouth daily.    [provider]  Vitamin E 268 MG (400 UNIT) CAPS Take 1 capsule by mouth daily.    [provider]  VITAMIN K PO Take 550 mcg by mouth daily.    [provider]  pantoprazole  (PROTONIX ) 40 MG tablet  TAKE 1 TABLET(40 MG) BY MOUTH TWICE DAILY 02/10/20   Armbruster, Elspeth SQUIBB, MD    Allergies: Keflex  [cephalexin ]    Review of Systems  HENT:  Positive for facial swelling.     Updated Vital Signs BP (!) 161/88   Pulse (!) 101   Temp 98.3 F (36.8 C) (Oral)   Resp 18   Ht 5' 4 (1.626 m)   Wt 98 kg   SpO2 98%   BMI 37.08 kg/m   Physical Exam Vitals and nursing note reviewed.  Constitutional:      Appearance: Normal appearance. She is not ill-appearing.  HENT:     Head: Normocephalic.     Comments: Patient with tenderness over the left frontal sinus, superior aspect of her nose, as well as the tip of the nose.  There is some redness noted.  No obvious induration or fluctuance.    Nose:     Comments: Erythema noted to bilateral nares. Eyes:     General: No scleral icterus. Pulmonary:     Effort: Pulmonary effort is normal. No respiratory distress.  Musculoskeletal:        General: No deformity.  Skin:    Coloration: Skin is not jaundiced.  Neurological:     General: No focal deficit present.     Mental Status: She is alert.  Psychiatric:        Mood and Affect: Mood normal.     (all labs ordered are listed, but only abnormal results are displayed) Labs Reviewed - No data to display  EKG: None  Radiology: No results found.   Procedures   Medications Ordered in the ED  doxycycline  (VIBRA -TABS) tablet 100 mg (has no administration in time range)  doxycycline  (VIBRA -TABS) tablet 300 mg (has no administration in time range)                                Medical Decision Making Risk Prescription drug management.   64 year old female who presents emergency department for evaluation of facial pain and swelling.  Differential diagnosis: Zoster, cellulitis, erysipelas, abscess, contact dermatitis.  Physical exam is most consistent with cellulitis.  There is no obvious area of induration or fluctuance that would require I&D treatment.  Does not appear to be  consistent with a dermatitic rash or zoster.  No eye involvement.  Patient has had a diagnosis of facial cellulitis in the past, with similar presentation.  Thus, I believe it is reasonable to start the patient on a doxycycline  prescription with follow-up to her PCP.  First dose given in the ER due to weather conditions and unlikely ability for patient to obtain her prescription today.  I have also sent the patient home with 3 additional doses of the doxycycline  prescription, due to the weather  conditions.  Instructions on when to take this has been given.  The remainder of the prescription has been sent to her pharmacy.  I did also offer her some pain medicine for the pain she is experiencing, however she declined.  No additional workup indicated at this time.  Patient's vitals are stable.  Patient is appropriate for discharge at this time.   Final diagnoses:  Facial pain    ED Discharge Orders          Ordered    doxycycline  (VIBRAMYCIN ) 100 MG capsule  2 times daily        07/21/24 1825               Torrence Marry RAMAN, PA-C 07/21/24 1826    Ruthe Cornet, DO 07/21/24 1832  "

## 2024-07-21 NOTE — ED Triage Notes (Signed)
 Swelling left nare up to bridge of nose- suspects cellulitis with HX same. No open sores seen. Afebrile. Denies eye involvement.

## 2024-07-22 ENCOUNTER — Other Ambulatory Visit (HOSPITAL_BASED_OUTPATIENT_CLINIC_OR_DEPARTMENT_OTHER): Payer: Self-pay

## 2024-07-28 ENCOUNTER — Other Ambulatory Visit: Payer: Self-pay

## 2024-07-29 ENCOUNTER — Other Ambulatory Visit (HOSPITAL_BASED_OUTPATIENT_CLINIC_OR_DEPARTMENT_OTHER): Payer: Self-pay

## 2024-07-30 ENCOUNTER — Other Ambulatory Visit (HOSPITAL_BASED_OUTPATIENT_CLINIC_OR_DEPARTMENT_OTHER): Payer: Self-pay

## 2024-08-01 ENCOUNTER — Other Ambulatory Visit (HOSPITAL_BASED_OUTPATIENT_CLINIC_OR_DEPARTMENT_OTHER): Payer: Self-pay

## 2024-08-02 ENCOUNTER — Encounter: Payer: Self-pay | Admitting: Family Medicine

## 2024-08-30 ENCOUNTER — Encounter: Admitting: Family Medicine
